# Patient Record
Sex: Female | Born: 1945 | Race: White | Hispanic: No | Smoking: Former smoker
Health system: Southern US, Community
[De-identification: ages and names within clinical notes are randomized; demographics above are authoritative.]

## PROBLEM LIST (undated history)

## (undated) DIAGNOSIS — I671 Cerebral aneurysm, nonruptured: Secondary | ICD-10-CM

## (undated) DIAGNOSIS — D649 Anemia, unspecified: Secondary | ICD-10-CM

## (undated) DIAGNOSIS — M069 Rheumatoid arthritis, unspecified: Secondary | ICD-10-CM

## (undated) DIAGNOSIS — J40 Bronchitis, not specified as acute or chronic: Secondary | ICD-10-CM

## (undated) DIAGNOSIS — T4145XA Adverse effect of unspecified anesthetic, initial encounter: Secondary | ICD-10-CM

## (undated) DIAGNOSIS — E278 Other specified disorders of adrenal gland: Secondary | ICD-10-CM

## (undated) DIAGNOSIS — T8859XA Other complications of anesthesia, initial encounter: Secondary | ICD-10-CM

## (undated) DIAGNOSIS — I4891 Unspecified atrial fibrillation: Secondary | ICD-10-CM

## (undated) DIAGNOSIS — J449 Chronic obstructive pulmonary disease, unspecified: Secondary | ICD-10-CM

## (undated) DIAGNOSIS — R0602 Shortness of breath: Secondary | ICD-10-CM

## (undated) DIAGNOSIS — J439 Emphysema, unspecified: Secondary | ICD-10-CM

## (undated) DIAGNOSIS — I729 Aneurysm of unspecified site: Secondary | ICD-10-CM

## (undated) DIAGNOSIS — M1990 Unspecified osteoarthritis, unspecified site: Secondary | ICD-10-CM

## (undated) DIAGNOSIS — G4730 Sleep apnea, unspecified: Secondary | ICD-10-CM

## (undated) DIAGNOSIS — E669 Obesity, unspecified: Secondary | ICD-10-CM

## (undated) DIAGNOSIS — F32A Depression, unspecified: Secondary | ICD-10-CM

## (undated) DIAGNOSIS — J9600 Acute respiratory failure, unspecified whether with hypoxia or hypercapnia: Secondary | ICD-10-CM

## (undated) DIAGNOSIS — F329 Major depressive disorder, single episode, unspecified: Secondary | ICD-10-CM

## (undated) DIAGNOSIS — N390 Urinary tract infection, site not specified: Secondary | ICD-10-CM

## (undated) DIAGNOSIS — K219 Gastro-esophageal reflux disease without esophagitis: Secondary | ICD-10-CM

## (undated) DIAGNOSIS — I10 Essential (primary) hypertension: Secondary | ICD-10-CM

## (undated) HISTORY — DX: Unspecified atrial fibrillation: I48.91

## (undated) HISTORY — PX: TUBAL LIGATION: SHX77

## (undated) HISTORY — DX: Sleep apnea, unspecified: G47.30

## (undated) HISTORY — DX: Anemia, unspecified: D64.9

## (undated) HISTORY — PX: BRAIN SURGERY: SHX531

## (undated) HISTORY — DX: Gastro-esophageal reflux disease without esophagitis: K21.9

## (undated) HISTORY — DX: Emphysema, unspecified: J43.9

## (undated) HISTORY — DX: Chronic obstructive pulmonary disease, unspecified: J44.9

## (undated) HISTORY — DX: Aneurysm of unspecified site: I72.9

## (undated) HISTORY — DX: Essential (primary) hypertension: I10

## (undated) HISTORY — PX: HAND SURGERY: SHX662

## (undated) HISTORY — DX: Cerebral aneurysm, nonruptured: I67.1

## (undated) HISTORY — DX: Unspecified osteoarthritis, unspecified site: M19.90

---

## 1998-07-10 ENCOUNTER — Ambulatory Visit (HOSPITAL_BASED_OUTPATIENT_CLINIC_OR_DEPARTMENT_OTHER): Admission: RE | Admit: 1998-07-10 | Discharge: 1998-07-10 | Payer: Self-pay | Admitting: Orthopedic Surgery

## 2000-06-01 ENCOUNTER — Encounter: Admission: RE | Admit: 2000-06-01 | Discharge: 2000-08-30 | Payer: Self-pay | Admitting: Internal Medicine

## 2001-07-08 ENCOUNTER — Encounter: Payer: Self-pay | Admitting: Internal Medicine

## 2001-07-08 ENCOUNTER — Encounter: Admission: RE | Admit: 2001-07-08 | Discharge: 2001-07-08 | Payer: Self-pay | Admitting: Internal Medicine

## 2003-05-31 ENCOUNTER — Encounter: Admission: RE | Admit: 2003-05-31 | Discharge: 2003-05-31 | Payer: Self-pay | Admitting: Internal Medicine

## 2004-08-21 ENCOUNTER — Ambulatory Visit (HOSPITAL_COMMUNITY): Admission: RE | Admit: 2004-08-21 | Discharge: 2004-08-21 | Payer: Self-pay | Admitting: Internal Medicine

## 2004-08-27 ENCOUNTER — Encounter: Admission: RE | Admit: 2004-08-27 | Discharge: 2004-08-27 | Payer: Self-pay | Admitting: Internal Medicine

## 2005-05-01 ENCOUNTER — Ambulatory Visit: Payer: Self-pay | Admitting: Internal Medicine

## 2005-05-06 ENCOUNTER — Ambulatory Visit: Payer: Self-pay | Admitting: *Deleted

## 2005-05-07 ENCOUNTER — Encounter: Admission: RE | Admit: 2005-05-07 | Discharge: 2005-05-07 | Payer: Self-pay | Admitting: Internal Medicine

## 2005-05-13 ENCOUNTER — Ambulatory Visit: Payer: Self-pay | Admitting: Pulmonary Disease

## 2005-06-19 ENCOUNTER — Ambulatory Visit: Payer: Self-pay | Admitting: Internal Medicine

## 2005-06-20 ENCOUNTER — Encounter: Admission: RE | Admit: 2005-06-20 | Discharge: 2005-06-20 | Payer: Self-pay | Admitting: Internal Medicine

## 2005-06-20 ENCOUNTER — Ambulatory Visit: Payer: Self-pay | Admitting: Internal Medicine

## 2005-07-17 ENCOUNTER — Ambulatory Visit: Payer: Self-pay | Admitting: Internal Medicine

## 2005-08-22 ENCOUNTER — Ambulatory Visit: Payer: Self-pay | Admitting: Internal Medicine

## 2005-11-24 ENCOUNTER — Ambulatory Visit: Payer: Self-pay | Admitting: Internal Medicine

## 2006-01-09 ENCOUNTER — Encounter: Admission: RE | Admit: 2006-01-09 | Discharge: 2006-01-09 | Payer: Self-pay | Admitting: Internal Medicine

## 2006-01-27 ENCOUNTER — Ambulatory Visit: Payer: Self-pay | Admitting: Critical Care Medicine

## 2006-03-24 ENCOUNTER — Ambulatory Visit: Payer: Self-pay | Admitting: Internal Medicine

## 2007-03-29 ENCOUNTER — Inpatient Hospital Stay (HOSPITAL_COMMUNITY): Admission: EM | Admit: 2007-03-29 | Discharge: 2007-04-01 | Payer: Self-pay | Admitting: Emergency Medicine

## 2007-04-01 ENCOUNTER — Encounter: Payer: Self-pay | Admitting: Internal Medicine

## 2007-04-01 DIAGNOSIS — J438 Other emphysema: Secondary | ICD-10-CM | POA: Insufficient documentation

## 2007-04-01 DIAGNOSIS — R42 Dizziness and giddiness: Secondary | ICD-10-CM | POA: Insufficient documentation

## 2007-04-01 DIAGNOSIS — M069 Rheumatoid arthritis, unspecified: Secondary | ICD-10-CM | POA: Insufficient documentation

## 2007-04-01 DIAGNOSIS — N959 Unspecified menopausal and perimenopausal disorder: Secondary | ICD-10-CM | POA: Insufficient documentation

## 2007-04-01 DIAGNOSIS — I059 Rheumatic mitral valve disease, unspecified: Secondary | ICD-10-CM | POA: Insufficient documentation

## 2007-04-01 DIAGNOSIS — J449 Chronic obstructive pulmonary disease, unspecified: Secondary | ICD-10-CM | POA: Insufficient documentation

## 2007-04-01 DIAGNOSIS — E1165 Type 2 diabetes mellitus with hyperglycemia: Secondary | ICD-10-CM

## 2007-04-01 DIAGNOSIS — Z8679 Personal history of other diseases of the circulatory system: Secondary | ICD-10-CM | POA: Insufficient documentation

## 2007-04-01 DIAGNOSIS — G4733 Obstructive sleep apnea (adult) (pediatric): Secondary | ICD-10-CM | POA: Insufficient documentation

## 2007-04-01 DIAGNOSIS — J45909 Unspecified asthma, uncomplicated: Secondary | ICD-10-CM | POA: Insufficient documentation

## 2007-05-21 ENCOUNTER — Ambulatory Visit: Payer: Self-pay | Admitting: Pulmonary Disease

## 2007-05-21 ENCOUNTER — Inpatient Hospital Stay (HOSPITAL_COMMUNITY): Admission: EM | Admit: 2007-05-21 | Discharge: 2007-05-28 | Payer: Self-pay | Admitting: Emergency Medicine

## 2007-06-07 ENCOUNTER — Encounter: Payer: Self-pay | Admitting: Interventional Radiology

## 2007-07-28 ENCOUNTER — Ambulatory Visit (HOSPITAL_COMMUNITY): Admission: RE | Admit: 2007-07-28 | Discharge: 2007-07-28 | Payer: Self-pay | Admitting: Internal Medicine

## 2007-08-03 ENCOUNTER — Other Ambulatory Visit: Admission: RE | Admit: 2007-08-03 | Discharge: 2007-08-03 | Payer: Self-pay | Admitting: Internal Medicine

## 2007-08-24 ENCOUNTER — Inpatient Hospital Stay (HOSPITAL_COMMUNITY): Admission: AD | Admit: 2007-08-24 | Discharge: 2007-08-25 | Payer: Self-pay | Admitting: Interventional Radiology

## 2007-09-07 ENCOUNTER — Encounter: Payer: Self-pay | Admitting: Interventional Radiology

## 2008-01-11 ENCOUNTER — Inpatient Hospital Stay (HOSPITAL_COMMUNITY): Admission: RE | Admit: 2008-01-11 | Discharge: 2008-01-12 | Payer: Self-pay | Admitting: Interventional Radiology

## 2008-01-16 ENCOUNTER — Observation Stay (HOSPITAL_COMMUNITY): Admission: EM | Admit: 2008-01-16 | Discharge: 2008-01-17 | Payer: Self-pay | Admitting: Emergency Medicine

## 2008-01-16 ENCOUNTER — Ambulatory Visit: Payer: Self-pay | Admitting: Internal Medicine

## 2008-01-25 ENCOUNTER — Encounter: Payer: Self-pay | Admitting: Interventional Radiology

## 2008-07-14 ENCOUNTER — Ambulatory Visit (HOSPITAL_COMMUNITY): Admission: RE | Admit: 2008-07-14 | Discharge: 2008-07-14 | Payer: Self-pay | Admitting: Interventional Radiology

## 2008-12-07 ENCOUNTER — Ambulatory Visit (HOSPITAL_COMMUNITY): Admission: RE | Admit: 2008-12-07 | Discharge: 2008-12-07 | Payer: Self-pay | Admitting: Internal Medicine

## 2009-01-29 ENCOUNTER — Ambulatory Visit (HOSPITAL_COMMUNITY): Admission: RE | Admit: 2009-01-29 | Discharge: 2009-01-29 | Payer: Self-pay | Admitting: Interventional Radiology

## 2009-03-05 ENCOUNTER — Encounter: Admission: RE | Admit: 2009-03-05 | Discharge: 2009-03-05 | Payer: Self-pay | Admitting: Internal Medicine

## 2009-03-09 ENCOUNTER — Encounter: Payer: Self-pay | Admitting: Interventional Radiology

## 2009-05-25 ENCOUNTER — Ambulatory Visit (HOSPITAL_COMMUNITY): Admission: RE | Admit: 2009-05-25 | Discharge: 2009-05-25 | Payer: Self-pay | Admitting: Interventional Radiology

## 2009-07-17 ENCOUNTER — Inpatient Hospital Stay (HOSPITAL_COMMUNITY): Admission: RE | Admit: 2009-07-17 | Discharge: 2009-07-18 | Payer: Self-pay | Admitting: Interventional Radiology

## 2009-07-31 ENCOUNTER — Encounter: Payer: Self-pay | Admitting: Interventional Radiology

## 2010-05-12 ENCOUNTER — Encounter: Payer: Self-pay | Admitting: Internal Medicine

## 2010-05-12 ENCOUNTER — Encounter: Payer: Self-pay | Admitting: Interventional Radiology

## 2010-07-10 LAB — DIFFERENTIAL
Basophils Absolute: 0.1 10*3/uL (ref 0.0–0.1)
Basophils Relative: 1 % (ref 0–1)
Neutro Abs: 8.2 10*3/uL — ABNORMAL HIGH (ref 1.7–7.7)
Neutrophils Relative %: 69 % (ref 43–77)

## 2010-07-10 LAB — BASIC METABOLIC PANEL
BUN: 16 mg/dL (ref 6–23)
CO2: 31 mEq/L (ref 19–32)
Calcium: 8.4 mg/dL (ref 8.4–10.5)
Creatinine, Ser: 1.03 mg/dL (ref 0.4–1.2)
Glucose, Bld: 93 mg/dL (ref 70–99)

## 2010-07-10 LAB — CBC
MCHC: 32.5 g/dL (ref 30.0–36.0)
RDW: 16 % — ABNORMAL HIGH (ref 11.5–15.5)

## 2010-07-10 LAB — APTT: aPTT: 27 seconds (ref 24–37)

## 2010-07-10 LAB — PROTIME-INR: INR: 1.05 (ref 0.00–1.49)

## 2010-07-12 ENCOUNTER — Other Ambulatory Visit: Payer: Self-pay | Admitting: Internal Medicine

## 2010-07-12 DIAGNOSIS — Z1231 Encounter for screening mammogram for malignant neoplasm of breast: Secondary | ICD-10-CM

## 2010-07-14 LAB — BASIC METABOLIC PANEL
BUN: 19 mg/dL (ref 6–23)
CO2: 31 mEq/L (ref 19–32)
CO2: 33 mEq/L — ABNORMAL HIGH (ref 19–32)
Calcium: 7.7 mg/dL — ABNORMAL LOW (ref 8.4–10.5)
Chloride: 103 mEq/L (ref 96–112)
Chloride: 96 mEq/L (ref 96–112)
Creatinine, Ser: 1.04 mg/dL (ref 0.4–1.2)
GFR calc Af Amer: >60 mL/min (ref >60)
Glucose, Bld: 100 mg/dL — ABNORMAL HIGH (ref 70–99)
Potassium: 3.8 mEq/L (ref 3.5–5.1)
Sodium: 137 mEq/L (ref 135–145)

## 2010-07-14 LAB — DIFFERENTIAL
Basophils Relative: 0 % (ref 0–1)
Eosinophils Absolute: 0.3 10*3/uL (ref 0.0–0.7)
Eosinophils Relative: 2 % (ref 0–5)
Monocytes Relative: 6 % (ref 3–12)
Neutrophils Relative %: 79 % — ABNORMAL HIGH (ref 43–77)

## 2010-07-14 LAB — CBC
HCT: 34.2 % — ABNORMAL LOW (ref 36.0–46.0)
HCT: 39.6 % (ref 36.0–46.0)
Hemoglobin: 11.2 g/dL — ABNORMAL LOW (ref 12.0–15.0)
MCHC: 32.8 g/dL (ref 30.0–36.0)
MCHC: 32.8 g/dL (ref 30.0–36.0)
MCV: 85.5 fL (ref 78.0–100.0)
MCV: 85.8 fL (ref 78.0–100.0)
RBC: 3.99 MIL/uL (ref 3.87–5.11)
RBC: 4.61 MIL/uL (ref 3.87–5.11)
RDW: 17 % — ABNORMAL HIGH (ref 11.5–15.5)

## 2010-07-14 LAB — PROTIME-INR: INR: 0.97 (ref 0.00–1.49)

## 2010-07-17 ENCOUNTER — Ambulatory Visit
Admission: RE | Admit: 2010-07-17 | Discharge: 2010-07-17 | Disposition: A | Discharge status: Home / Self Care | Payer: PRIVATE HEALTH INSURANCE | Source: Ambulatory Visit | Attending: Internal Medicine | Admitting: Internal Medicine

## 2010-07-17 DIAGNOSIS — Z1231 Encounter for screening mammogram for malignant neoplasm of breast: Secondary | ICD-10-CM

## 2010-07-19 ENCOUNTER — Other Ambulatory Visit: Payer: Self-pay | Admitting: Internal Medicine

## 2010-07-19 DIAGNOSIS — R928 Other abnormal and inconclusive findings on diagnostic imaging of breast: Secondary | ICD-10-CM

## 2010-07-24 ENCOUNTER — Ambulatory Visit
Admission: RE | Admit: 2010-07-24 | Discharge: 2010-07-24 | Disposition: A | Discharge status: Home / Self Care | Payer: Medicare Other | Source: Ambulatory Visit | Attending: Internal Medicine | Admitting: Internal Medicine

## 2010-07-24 DIAGNOSIS — R928 Other abnormal and inconclusive findings on diagnostic imaging of breast: Secondary | ICD-10-CM

## 2010-07-29 ENCOUNTER — Other Ambulatory Visit: Payer: Self-pay | Admitting: Internal Medicine

## 2010-07-29 DIAGNOSIS — Z09 Encounter for follow-up examination after completed treatment for conditions other than malignant neoplasm: Secondary | ICD-10-CM

## 2010-08-01 LAB — BASIC METABOLIC PANEL
CO2: 31 mEq/L (ref 19–32)
Chloride: 101 mEq/L (ref 96–112)
GFR calc Af Amer: >60 mL/min (ref >60)
Glucose, Bld: 113 mg/dL — ABNORMAL HIGH (ref 70–99)
Potassium: 4.1 mEq/L (ref 3.5–5.1)
Sodium: 138 mEq/L (ref 135–145)

## 2010-08-01 LAB — CBC
HCT: 36.1 % (ref 36.0–46.0)
Hemoglobin: 11.7 g/dL — ABNORMAL LOW (ref 12.0–15.0)
MCHC: 32.5 g/dL (ref 30.0–36.0)
RBC: 4.33 MIL/uL (ref 3.87–5.11)
RDW: 16.2 % — ABNORMAL HIGH (ref 11.5–15.5)

## 2010-08-01 LAB — PROTIME-INR: INR: 1 (ref 0.00–1.49)

## 2010-08-13 ENCOUNTER — Other Ambulatory Visit (HOSPITAL_COMMUNITY): Payer: Self-pay | Admitting: Interventional Radiology

## 2010-08-13 DIAGNOSIS — I729 Aneurysm of unspecified site: Secondary | ICD-10-CM

## 2010-08-15 ENCOUNTER — Ambulatory Visit (HOSPITAL_COMMUNITY)
Admission: RE | Admit: 2010-08-15 | Discharge: 2010-08-15 | Disposition: A | Discharge status: Home / Self Care | Payer: Medicare Other | Source: Ambulatory Visit | Attending: Interventional Radiology | Admitting: Interventional Radiology

## 2010-08-15 ENCOUNTER — Other Ambulatory Visit (HOSPITAL_COMMUNITY): Payer: Self-pay | Admitting: Interventional Radiology

## 2010-08-15 DIAGNOSIS — I729 Aneurysm of unspecified site: Secondary | ICD-10-CM

## 2010-08-15 DIAGNOSIS — I671 Cerebral aneurysm, nonruptured: Secondary | ICD-10-CM | POA: Insufficient documentation

## 2010-08-15 LAB — BUN: BUN: 32 mg/dL — ABNORMAL HIGH (ref 6–23)

## 2010-08-15 LAB — CREATININE, SERUM
Creatinine, Ser: 1.81 mg/dL — ABNORMAL HIGH (ref 0.4–1.2)
GFR calc non Af Amer: 28 mL/min — ABNORMAL LOW (ref >60)

## 2010-09-03 NOTE — Consult Note (Signed)
NAMEAMBRY, DIX NO.:  [id number]   MEDICAL RECORD NO.:  [id number]          PATIENT TYPE:  INP   LOCATION:  3108                         FACILITY:  [facility]   PHYSICIAN:  Michiel Cowboy, MDDATE OF BIRTH:  06-27-1945   DATE OF CONSULTATION:  05/25/2007  DATE OF DISCHARGE:                                 CONSULTATION   PRIMARY CARE PHYSICIAN:  Deirdre Peer. Polite, M.D.   PULMONOLOGIST:  Charlaine Dalton. Wert, MD, FCCP   This is a 65 year old female with past medical history significant for  chronic obstructive pulmonary disease and paroxysmal atrial  fibrillation. Patient presented to emergency department on May 21, 2007 with headache and turned out to have an  aortic aneurysm and  was  consulted and patient had endovascular repair on May 21, 2007  patient was intubated and in neuro intensive care unit but now has been  doing much better.  Extubated on May 23, 2007 and doing very good  currently on 2 L of FIO2 with still some slight wheezing.  Examined at  bedside.  Patient still reports some headache.  As per neurosurgery this  is to be expected.  Otherwise doing very well tolerating p.o.'s.  Medicine consult was called for management of her medical issues.   PAST MEDICAL HISTORY:  Questionable history of paroxysmal atrial  fibrillation, rheumatoid arthritis, chronic obstructive pulmonary  disease and gastroesophageal reflux disease.   SOCIAL HISTORY:  The patient is 45 pack year smoker.  Denies alcohol.  Lives with her friend at home.   FAMILY HISTORY:  Noncontributory.   MEDICATIONS:  Advair 250/50, Spiriva once a  day, Protonix 40 mg once a  day, sliding scale insulin  Those are current medications.   HOME MEDICATIONS:  Prilosec 20 mg daily, prednisone 10 mg by mouth  daily, Lexapro 10 mg by mouth daily, bisoprolol 5 mg by mouth daily,  Aldactone/hydrochlorothiazide 25/25 one tablet each daily, Spiriva one  puff each daily, Advair one puff  twice daily and albuterol two puffs  every four to six hours as needed.   VITAL SIGNS:  Blood pressure 139/49, respirations 18, satting 95% on  room air, heart rate 76 to 103.  Blood glucose 209 which is down to 88.   PHYSICAL EXAMINATION:  Obese female in no acute distress.  HEENT:  Head nontraumatic.  PERRL.  Moist mucous membranes.  LUNGS:  Wheezes bilaterally distant breath sounds.  HEART:  Regular rate and rhythm, no murmurs, rubs or gallops.  EXTREMITIES:  With 1+ edema.   LABORATORY DATA:  White blood cell count done 13.6, hemoglobin 9.2,  platelet count 351,000.  Sodium 139, potassium 3.5, creatinine 0.8 down  from 1.4.  Calcium 8.5.  Chest x-ray show no acute disease, mild  bilateral atelectasis.  Patient had MRI and MRA today,  results of which  are pending.   ASSESSMENT AND PLAN:  This is a 65 year old female with history of  severe COPD, questionable paroxysmal atrial fibrillation, hypertension,  rheumatoid arthritis, GERD, who is now status post a coil of basilar  aneurysm.  1. Basilar aneurysm as per neurosurgery.  2.  History of chronic obstructive pulmonary disease, will continue      Advair, Spiriva.  Will change Ventolin to Xopenex as needed.  The      patient has chronic shortness of breath.  She has been recently      extubated and had  been on 2 L of oxygen FiO2,  with fairly good      sats to stay above 91% for patient with COPD oxygen saturations      within 88 to 93% are tolerable.  If wheezing persists or worsens      would increase steroid dose but for right now given elevated      glucose levels.  3. Atelectasis.  Would continue incentive spirometer.  4. History of sleep apnea.  Patient refuses CPAP.  Will continue to      use oxygen at night.  5. Elevated creatinine on admission, now currently  improved after      fluids.  Patient now off IV hydration secondary to  edema.  6. History of atrial fibrillation  ? Will discuss with Dr. [name].      For now  would restart bisoprolol 5 mg by mouth daily 40 parameters.  7. History of hypertension.  Patient was off hydrochlorothiazide      secondary to hyponatremia.  Would be able to restart      hydrochlorothiazide once blood pressure allows.  8. Mild peripheral edema, likely status post good rehydration.  Will      monitor, check blood pressure  in the morning.  Currently patient      in no acute distress.  Chest x-ray does not show any  pulmonary      edema.  She may benefit in the future from restarting her      hydrochlorothiazide.  9. History of RA-  will continue steroids at current doses.  10.Elevated blood glucose likely secondary to steroids.  Continue      sliding scale for now.  Hopefully this will improve as steroids are      weaned down.  If have to increase steroid dose secondary to COPD,      may need initiation of standing insulin.  11.Gastroesophageal reflux disease.  Will continue Protonix.  Will      check morning labs.  Dr. [name] to follow up in the morning.      Michiel Cowboy, MD  Electronically Signed     AVD/MEDQ  D:  05/25/2007  T:  05/26/2007  Job:  [id number]   cc:   Hilda Lias, M.D.  Deirdre Peer. Polite, M.D.

## 2010-09-03 NOTE — H&P (Signed)
NAME:  Jaime, Mccormick NO.:  [id number]   MEDICAL RECORD NO.:  [id number]          PATIENT TYPE:  INP   LOCATION:  1824                         FACILITY:  [facility]   PHYSICIAN:  Jaime Furlong, MD      DATE OF BIRTH:  01-14-1946   DATE OF ADMISSION:  01/16/2008  DATE OF DISCHARGE:                              HISTORY & PHYSICAL   PRIMARY CARE Mikhi Mccormick:  Jaime Dills, MD.   CHIEF COMPLAINT:  Severe intractable headache since intracerebral  arterial coiling.   HISTORY OF PRESENTING ILLNESS:  [name] is a 65 year old Caucasian  female who lives in [place] with her friend.  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  She had a history of coiling placed in the past in  January and May 2009.  The patient lists he had a worsening headache,  especially he had undergone procedure by Dr. Julieanne Mccormick, with  interventional neuroradiology, on September 22nd for the recoiling of  basilar artery aneurysm.  After the surgery she went home and she  continued to have headache which was mild to moderate in nature, located  on the left side of the head, mostly on the occipital and parietal area.  Last night she had a very severe headache which also continued this  morning and today.  She and her friend noticed that her face was  drooping on the right side also, but it did not last very long and she  did not have any trouble speaking at the time of facial droop.  She  denied any other neurological symptoms including any focal neurological  weakness and paresthesia since the surgery.  She denied any other  systemic symptoms including fever, chills, nausea, vomiting, diarrhea,  abdominal pain, lower extremity swelling, visual changes, hearing  changes.  She does have a chronic history of nausea with pain  medication, ibuprofen use occasionally.  Past medical history is also  significant for intracranial  aneurysms.  Patient denied any severe  headache at the time of encounter.  She mentioned that her headache is  slowly getting better since she has been in the ER without any active  intervention.  She had an MRI done without contrast in the ER which did  not show any acute worsening of her coiling or any intracranial  pathology.  ER physician has spoke to [name] on the phone and  they have decided to observe patient for 23 hours in the hospital.  That  is why patient is getting admitted to [name] service tonight for  observation.  The patient is clinically stable at this time.   PAST MEDICAL HISTORY:  1. Left internal carotid artery, basilar artery aneurysm.  2. Status intravascular coiling.  3. COPD.  4. Hypertension.  5. Gastroesophageal reflux disease.  6. Rheumatoid arthritis.  7. History of tobacco use.  8. Obstructive sleep apnea.  9. Obesity.  10.Mild renal insufficiency.  11.Questionable history of paroxysmal atrial fibrillation.  12.Anxiety.  13.Depression.   THE FAMILY HISTORY:  Not significant for any intracranial aneurysms.  SOCIAL HISTORY:  Patient lives with her friend in [place], no recent  alcohol, drug, tobacco use.   ALLERGY:  PATIENT IS ALLERGIC TO following medicine. Please review the  chart   HOME MEDICATIONS:  Prilosec, Spiriva with HandiHaler,  Aldactazide  including Aldactone and hydrochlorothiazide, Ambien,  Advair,  bisoprolol, Boniva, Lasix, prednisone, Plavix and aspirin.   REVIEW OF SYSTEMS:  Positive as per HPI, otherwise negative review of  systems done for 14 systems.   PHYSICAL EXAM:  VITAL SIGNS:  Blood pressure 134/77, pulse 66,  respirations 28, temperature 97.3, oxygen saturation 95% on room air.  GENERAL EXAM:  Alert, oriented x3, not in acute distress lying in bed.  CARDIOVASCULAR:  S1, S2 regular.  No murmur, rub, gallop.  LUNGS:  Clear to auscultation bilaterally.  No wheezing, rhonchi,  crackles.  ABDOMEN:   Nontender, nondistended.  Bowel sounds present.  No  organomegaly appreciated, the patient is obese.  EXTREMITIES:  No clubbing, cyanosis, edema.  All pulses palpable in all  4 extremities.  NEUROLOGICAL EXAM:  Shows intact cranial nerves, muscular strength,  sensation, reflexes are within normal limits at this time.  HEAD:  Normocephalic nontraumatic.  EYES:  Pupils equally reactive to light and accommodation.  Extraocular  muscles intact.  ORAL CAVITY:  Oral mucosa moist.  No thrush noted.  NECK:  No thyromegaly or JVD.  Patient is obese in the neck also.  SKIN:  No rash or bruits.   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   ASSESSMENT AND PLAN:  1. Severe intractable headache status post procedure for an      intracranial aneurysm, history of bibasilar artery aneurysm status      post arterial coiling.  2. History of chronic obstructive pulmonary disease.  3. History of hypertension.  4. History of gastroesophageal reflux disease.  5. History of rheumatoid arthritis.  6. History of tobacco use.  7. History of obstructive sleep apnea.  8. History of mild renal insufficiency.  9. History of questionable paroxysmal atrial fibrillation.  10.History of depression.  11.History of anxiety.   PLAN:  Will admit patient to Dr. Renford Mccormick on telemetry bed for  overnight observation with a diagnosis of intractable headache status  post procedure.  We will check vitals and neuro check every 4 hours.  We  will check input/output every 8 hours.  We will get CBC with  differential  and CMP now and ESR also.  We will give breathing treatment  with albuterol ipratropium q.6 h and p.r.n.  Will give oxygen by nasal  cannula to keep oxygen saturation more than 92.  We will give her  omeprazole for GI prophylaxis.  We will give her albuterol inhaler as  needed for her acute episode of shortness of breath.  We will give her  Ambien for her sleep tonight.  Will continue her hydrochlorothiazide,  Plavix and aspirin at home dose.  We will make sure interventional  neuroradiologist, Dr.  Corliss Mccormick, sees her in the morning before the discharge planning.  Will  give her ibuprofen 800 mg p.o. q.8 h p.r.n. for headache and for  severe  headache I will add morphine p.r.n. also IV.  Further plan according to  the consult pending.      Jaime Furlong, MD  Electronically Signed     TVP/MEDQ  D:  01/16/2008  T:  01/16/2008  Job:  [id number]   cc:   Deirdre Peer. Polite, M.D.

## 2010-09-03 NOTE — H&P (Signed)
NAME:  [name], NGHIEM NO.:  [id number]   MEDICAL RECORD NO.:  [id number]          PATIENT TYPE:  EMS   LOCATION:  MAJO                         FACILITY:  [facility]   PHYSICIAN:  Michelene Gardener, MD    DATE OF BIRTH:  [birth year]   DATE OF ADMISSION:  03/29/2007  DATE OF DISCHARGE:                              HISTORY & PHYSICAL   PRIMARY CARE PHYSICIAN:  Deirdre Peer. Polite, M.D.   CHIEF COMPLAINT:  Increasing shortness of breath.   HISTORY OF PRESENT ILLNESS:  This is a 65 year old Caucasian female with  past medical history if COPD, who presented with the above mentioned  complaint.  Patient currently in acute shortness of breath and she is  not able to give good history.  Stated that for the last 10 days she has  been having increasing shortness of breath that has been increasing  gradually.  Sought her doctor and was prescribed some medications but  did not help very much, so she came in today for further evaluation.  Also been having some cough.  Denied chest pain.  In the ER, her initial  vitals showed temperature 97.2, blood pressure 176/71, pulse 86,  respiratory rate 26.  Patient was given Solu-Medrol, oxygen and one dose  of IV antibiotics and the Hospitalist Service was called for further  evaluation.   ALLERGIES:  No known drug allergies.   CURRENT MEDICATIONS:  Patient is not able to give much details about  medications for now but states that she is taking Prilosec, Spiriva,  Aldactazide, Ambien, Advair, bisoprolol, Boniva.   PAST MEDICAL HISTORY:  1. COPD.  2. GERD.   PAST SURGICAL HISTORY:  Denied.   SOCIAL HISTORY:  No smoking, no alcohol.   FAMILY HISTORY:  Noncontributory.   REVIEW OF SYSTEMS:  As per HPI.   PHYSICAL EXAMINATION:  VITAL SIGNS:  Temperature 97.2, blood pressure  176/71, pulse 86, respiratory rate 26.  GENERAL APPEARANCE:  This is a middle-age, obese Caucasian female not in  acute distress.  HEENT:  Conjunctivae pink.   Pupils are equal, round, reactive to light  and accommodation.  There is no ptosis.  Extraocular movements intact.  There is no ear discharge or infection.  There is no nose infection or  bleeding.  Oral mucosa is dry.  No pharyngeal erythema.  NECK:  Supple.  No JVD, no carotid bruit, no lymphadenopathy.  No  thyroid enlargement.  LUNGS:  Patient is breathing around 20-22.  There is no use of accessory  muscles.  No rales, no rhonchi.  There is positive bilateral expiratory  wheezes.  CARDIOVASCULAR:  S1 and S2 are regular.  No murmurs, gallops or thrills.  ABDOMEN:  Soft, obese, not distended.  No tenderness and no  hepatosplenomegaly.  EXTREMITIES:  Lower extremities with no edema, no rash, no varicose  veins.  SKIN:  No rash and no erythema.  NEUROLOGIC:  Cranial nerves are intact.  Unable to do detailed exam  because of her current shortness of breath.   LABORATORY DATA:  Sodium 122, potassium 3.8, chloride 91, bicarb 27.4,  BUN 26, creatinine 1.8.  WBC 22.5, hemoglobin 11.6, hematocrit 34.7,  platelet count 420.   Chest x-ray showed hyperinflation consistent with COPD and there is no  evidence of pneumonia.   IMPRESSION:  1. Chronic obstructive pulmonary disease exacerbation.  I will admit      this patient to the step-down unit.  I will get stat ABG on her to      see if she is retaining carbon dioxide.  I advised her about      possible BiPAP but she stated that she cannot tolerate BiPAP from      previous admissions.  Will start on Solu-Medrol 80 mg IV q.6h.  Put      her on Atrovent Proventil nebulizer treatment q.4h. scheduled and      q.2h. p.r.n.  I will start her on Avelox 400 mg once daily.  Will      apply oxygen to keep saturation between 88-92.  I will continue her      Advair and Spiriva.  This patient has been followed by Dr. Sandrea Hughs for pulmonary and if her condition worsens, then will consider      consulting him.  2. Systemic inflammatory response  syndrome.  This patient has elevated      white count, with associated tachycardia.  There is no evidence of      infection.  Might be bronchitis but her white count is elevated      more than simple bronchitis.  Patient already started on Avelox IV.      Was sent for urinalysis, urine culture, blood culture x2 and sputum      culture.  3. Hyponatremia.  Etiology is unclear at this point. Her sodium is      122.  There is no previous labs for comparison.  Will send      hyponatremia testing that includes serum osmolality, urine      osmolality, TSH and cortisol level. This patient is on diuretics      and this might contribute to her hyponatremia.  I will hold her      diuretics for now and will repeat her sodium.  Will also start her      on normal saline at 70 mL per hour.  4. Renal insufficiency.  Her creatinine is 1.8.  Her BUN is 26.  This      might be consistent with dehydration.  Again, this might be      secondary to her diuretics, her hydrochlorothiazide.  I will hold      her hydrochlorothiazide, apply some fluids and will follow her      sodium level.  5. Gastroesophageal reflux disease.  Continue current medications.  6. Hypertension.  Continue her current medications and hold      hydrochlorothiazide.   TOTAL ASSESSMENT TIME:  One hour of critical care time.      Michelene Gardener, MD  Electronically Signed     NAE/MEDQ  D:  03/29/2007  T:  03/29/2007  Job:  [id number]   cc:   Deirdre Peer. Polite, M.D.

## 2010-09-03 NOTE — H&P (Signed)
[name], HASAN NO.:  [id number]   MEDICAL RECORD NO.:  [id number]          PATIENT TYPE:  INP   LOCATION:  3108                         FACILITY:  [facility]   PHYSICIAN:  Hilda Lias, M.D.   DATE OF BIRTH:  [birth year]   DATE OF ADMISSION:  05/21/2007  DATE OF DISCHARGE:                              HISTORY & PHYSICAL   Ms. Shehata is a lady who came to the emergency room complaining of  headache, difficulty walking and difficulty with her right eye to the  point that she has been unable to focus.  This problem has been going on  for 10 days.  She has been treated by Dr. [name] for COPD as well as  hypertension.  Because of the findings at the emergency room,  proceeded  with a CT scan, and we were called for evaluation.   PAST MEDICAL HISTORY:  She has had surgery on both hands.   She is allergic to __________ injection.   At the present time she is taking, ibuprofen, hydrochlorothiazide, and  prednisone.   FAMILY HISTORY:  Unremarkable.   SOCIAL HISTORY:  The patient does not smoke.   REVIEW OF SYSTEMS:  Positive for a history of COPD, being taken care of  by Dr. Sherene Sires.  She also has a history of rheumatoid arthritis, being  taken care of by Dr. [name].   PHYSICAL EXAMINATION:  GENERAL:  The patient lying bed.  She is not in  any acute distress.  HEENT:  Normal.  NECK:  There is no evidence of any stiffness.  LUNGS:  There is some bilateral wheezing.  They have distant sounds.  CARDIOVASCULAR:  Normal.  ABDOMEN:  Unable to feel any mass.  EXTREMITIES:  Lower extremity edema in leg __________.  NEUROLOGIC:  Mental status normal.  __________, the right pupil is about  4 mm, the left one is 3 mm.  The right one is sluggish to reaction.  The  face:  She has a mild weakness on the right side.  There is no weakness.  The reflexes are symmetrical.  Sensation normal.  Gait was not tested.   The CT scan showed a large mass in the midline most likely a  basilar tip  aneurysm.   CLINICAL IMPRESSION:  Basilar tip aneurysm.   RECOMMENDATIONS:  I talked to the patient, I talked to Dr. [name], and  also I talked to Dr. [name] from the x-ray department.  The lady is  going to be admitted.  We are going to proceed with a cerebral  angiogram, and from there a decision will be made and see if an  endovascular procedure can be done.  Dr. [name] fully explained the  procedure in front of me.  Will be standby in case of any complication.           ______________________________  Hilda Lias, M.D.     EB/MEDQ  D:  05/21/2007  T:  05/22/2007  Job:  [id number]

## 2010-09-03 NOTE — Consult Note (Signed)
NAME:  [name], [name]            ACCOUNT NO.:  [id number]   MEDICAL RECORD NO.:  [id number]          PATIENT TYPE:  OUT   LOCATION:  XRAY                         FACILITY:  [facility]   PHYSICIAN:  Sanjeev K. Deveshwar, M.D.DATE OF BIRTH:  Nov 27, 1945   DATE OF CONSULTATION:  09/07/2007  DATE OF DISCHARGE:                                 CONSULTATION   DATE OF CONSULT:  Sep 07, 2007.   CHIEF COMPLAINT:  Status post cerebral aneurysm coiling.   HISTORY OF PRESENT ILLNESS:  This is a very pleasant 65 year old female  who was initially referred to Dr. [name] through the courtesy of Dr.  [name] on May 21, 2007, after the patient presented to [facility] emergency department with a severe headache and was found to  have a giant basilar apex aneurysm.  The aneurysm measured approximately  17 x 16 mm.  She underwent coiling of the aneurysm on the day of  admission.   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  The patient underwent  stent-assisted coiling of this aneurysm on Aug 24, 2007.  She returns  today, to be seen in followup by Dr. [name].   PAST MEDICAL HISTORY:  Significant for the above-noted aneurysms.  She  has a history of hypertension, COPD previously seen by Dr. Elesa Massed,  paroxysmal atrial fibrillation, rheumatoid arthritis, gastroesophageal  reflux disease, glucose intolerance, obstructive sleep apnea, although,  she does not tolerate CPAP.  She has had elevated glucose levels at  times.  She has some mild renal insufficiency.   ALLERGIES:  SHE IS ALLERGIC TO INJECTABLE GOLD.   CURRENT MEDICATIONS:  Include Spiriva, Advair, ProAir, omeprazole,  prednisone, bisoprolol, Lexapro, spironolactone/hydrochlorothiazide,  Plavix, enteric-coated aspirin 325 mg daily, Centrum Silver, calcium  with vitamin D, Ambien at bedtime, and she uses oxygen 2.5 L at night.   SOCIAL HISTORY:  The patient  lives in [place] with a friend.  She  lost her husband approximately 2 years ago.  She is still having some  problems with the emotional aspect of that situation.  She has remote  history of tobacco use.  She smoked for 45 years.  She did quit smoking.  She does not use alcohol.   FAMILY HISTORY:  There is no family history of cerebral aneurysms.   SURGICAL HISTORY:  The patient has had surgery on both hands.   IMPRESSION AND PLAN:  As noted, the patient presents today to be seen in  followup by Dr. [name] following endovascular treatment of a second  cerebral aneurysm performed on Aug 24, 2007.  The patient reports she has  been doing well overall, although, she does have occasional headaches  and she still has some visual problems associated with her initial  aneurysm.  She has diplopia, especially when looking down towards the  ground.   The patient remains on aspirin and Plavix.  Dr. [name] recommended  that the patient continue Plavix daily for 2 more weeks and switch to  every other day and then stop the Plavix.  She  should continue her  aspirin indefinitely.   Her previous hospital course was complicated by some anemia.  She  recently had some blood work performed at Dr. Idelle Crouch office, however,  we do not have the results of that CBC.  The patient reports that she is  still anemic.   Dr. [name] did review the results of the angiogram with the patient.  He explained to her again exactly what he did.  He showed her the  position of the stent as well as the coils in the most recent aneurysm.  The patient also had some questions regarding the previously coiled  giant basilar aneurysm.  The most recent angiogram did show compaction  of some of the coils in this giant basilar apex aneurysm.  Dr. [name]  has recommended a further procedure with a repeat angiogram in September  to further evaluate the situation.  If the coils are still compacted, he  felt that the  addition of further coils would be warranted.  The patient  is in agreement with this plan.  She did have multiple questions all of  which were answered.  Dr. [name] spent greater than 40 minutes with  the patient on this followup visit.      Delton See, P.A.    ______________________________  Grandville Silos. [name], M.D.    DR/MEDQ  D:  09/07/2007  T:  09/08/2007  Job:  [id number]   cc:   Hilda Lias, M.D.  Deirdre Peer. Polite, M.D.

## 2010-09-03 NOTE — H&P (Signed)
NAME:  [name], [name]            ACCOUNT NO.:  [id number]   MEDICAL RECORD NO.:  [id number]          PATIENT TYPE:  OIB   LOCATION:  3172                         FACILITY:  [facility]   PHYSICIAN:  Sanjeev K. Deveshwar, M.D.DATE OF BIRTH:  03-11-1946   DATE OF ADMISSION:  01/11/2008  DATE OF DISCHARGE:                              HISTORY & PHYSICAL   CHIEF COMPLAINTS:  Cerebral aneurysms.   HISTORY OF PRESENT ILLNESS:  This is a pleasant 65 year old female who  was initially referred to Dr. [name] through the courtesy of Dr.  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  The aneurysm measured approximately  17 x 16 mm.  She underwent coiling of the aneurysm on the day of the  admission.   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  The patient underwent  stent assisted coiling of that aneurysm on Aug 24, 2007.   The patient has been followed closely since that time.  She was found to  have some recannulization of the neck portion of the giant basilar apex  aneurysm and a decision was made to have the patient return to [facility] today to have this area further addressed.  The patient  was started on Plavix 3 days prior to admission in anticipation of  possible stent placement.   PAST MEDICAL HISTORY:  Significant for the above-noted aneurysms.  She  has a history of hypertension, COPD, previously followed by Dr. Sherene Sires.  She has a history of paroxysmal atrial fibrillation, rheumatoid  arthritis, gastroesophageal reflux disease, glucose intolerance,  obstructive sleep apnea although she does not tolerate CPAP, she has a  history of elevated glucose levels at times but has not been diagnosed  with diabetes according to the patient.  She has also had some mild  renal  insufficiency.   ALLERGIES:  The patient is allergic to INJECTABLE GOLD.  She also does  not tolerate some ADHESIVES from tape.   MEDICATIONS ON ADMISSION:  Included Spiriva, Advair, ProAir, omeprazole,  prednisone, bisoprolol, spironolactone, hydrochlorothiazide, enteric-  coated aspirin, Centrum Silver, Plavix, and zolpidem.   SOCIAL HISTORY:  The patient lives in [place] with a friend.  Her  husband passed away approximately 3 years ago.  She still has difficulty  dealing with the emotional aspect of the situation.  She has a remote  history of tobacco use.  She has since quit smoking.  She did smoke for  about 45 years.  She has COPD.  She does not use alcohol.   FAMILY HISTORY:  There is no family history of cerebral aneurysms.   SURGICAL HISTORY:  The patient has had surgery on both hands.  She  reports that she has had problems waking up after general anesthesia.   LABORATORY DATA:  INR is 1, PTT is 26, BUN is 18, creatinine 1.02,  hemoglobin 11.2, hematocrit 34.1, WBCs 13,200, although she is on  prednisone, platelet count 398,000, potassium was 4.1, GFR was 55,  glucose was 112.   REVIEW OF SYSTEMS:  Completely negative except for chronic dyspnea on  exertion with occasional cough productive of sputum.  She has had some  recent headaches.  She reports that she bruises easily.  She has joint  pain secondary to her rheumatoid arthritis.  She has had some high  glucose levels but has not been diagnosed with diabetes.  She has had  some problems with seasonal allergies.   PHYSICAL EXAM:  CONSTITUTIONAL:  Reveals a pleasant 65 year old white  female in no acute distress.  VITAL SIGNS:  Pending at time of this dictation.  HEENT:  Unremarkable.  NECK:  Revealed no bruits.  HEART:  Revealed regular rate and rhythm with distant heart sounds.  LUNGS:  Revealed decreased breath sounds without wheezing.  ABDOMEN:  Obese, soft, nontender.  EXTREMITIES:  Reveal pulses to be  intact with 1 to 2+ pitting edema.  Her airway was rated at a III, her ASA scale was a IV.  NEUROLOGIC:  The patient was alert and oriented and followed all  commands.  Cranial nerves II-XII are grossly intact.  Sensation was  intact to light touch.  Motor strength was 4/5 throughout.  Cerebellar  testing was intact.   IMPRESSION:  1. Giant basilar apex aneurysm coiled by Dr. [name] on May 21, 2007.  2. 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 [name] Aug 24, 2007.  3. Recannulization of the neck of the basilar aneurysm with plans for      further intervention today.  4. History of hypertension.  5. Severe chronic obstructive pulmonary disease.  6. Paroxysmal atrial fibrillation.  7. Rheumatoid arthritis.  8. Gastroesophageal reflux disease.  9. Glucose intolerance.  10.Obstructive sleep apnea.  11.History of elevated glucose levels.  12.Renal insufficiency.  13.Anxiety and depression.  14.Mild anemia.   PLAN:  As noted, the patient presents today to undergo further  intervention on her basilar aneurysm which initially measured 17 x 16  mm.  The patient does have a history of some renal insufficiency.  She  was told to stop her  diuretics several days prior to admission and a increase her fluid  intake.  She was also given a sodium bicarb protocol at time of  admission.  This did improve her creatinine.  She was started on Plavix  3 days prior to admission in anticipation of possible stent placement.  A cerebral angiogram will be repeated prior to intervening.      Delton See, P.A.    ______________________________  Grandville Silos. [name], M.D.    DR/MEDQ  D:  01/11/2008  T:  01/11/2008  Job:  [id number]   cc:   Deirdre Peer. Polite, M.D.  Charlaine Dalton. Sherene Sires, MD, [facility]  Hilda Lias, M.D.

## 2010-09-03 NOTE — Discharge Summary (Signed)
[name], [name]            ACCOUNT NO.:  [id number]   MEDICAL RECORD NO.:  [id number]          PATIENT TYPE:  INP   LOCATION:  3108                         FACILITY:  [facility]   PHYSICIAN:  [name], M.D.DATE OF BIRTH:  01/10/1946   DATE OF ADMISSION:  05/21/2007  DATE OF DISCHARGE:  05/28/2007                               DISCHARGE SUMMARY   CHIEF COMPLAINT:  Cerebral aneurysm.   HISTORY OF PRESENT ILLNESS:  This is a very pleasant 65 year old female  who was admitted to [facility] on May 21, 2007, by Dr.  Jeral Mccormick for evaluation of a headache, difficulty walking and visual  changes in her right eye.  These symptoms apparently had been going on  for several weeks.  The patient had a CT scan of the head without  contrast that revealed a large basilar tip aneurysm.  There were chronic  microvascular ischemic changes as well.  Dr. Corliss Mccormick was consulted and  the patient was taking taken urgently to the interventional radiology  lab where she underwent endovascular repair of her aneurysm performed by  Dr. Corliss Mccormick under general anesthesia.  The patient was admitted to the  neuro intensive care unit following the procedure.   PAST MEDICAL HISTORY:  The patient has a questionable history of  paroxysmal atrial fibrillation.  She has rheumatoid arthritis.  She has  COPD, gastroesophageal reflux disease, hypertension, history of  peripheral edema.  She has had elevated glucose levels secondary to  steroid therapy.  She apparently uses oxygen at home.   SURGICAL HISTORY:  The patient has had surgery on both hands.   ALLERGIES:  The patient is allergic to INJECTABLE GOLD; the exact nature  of the reaction is unknown at this time.   MEDICATIONS ON ADMISSION:  Include:  1. Prilosec 20 mg daily.  2. Prednisone 10 mg daily.  3. Lexapro 10 mg daily.  4. Bisoprolol 5 mg daily.  5. Aldactone/hydrochlorothiazide 25/25 one daily.  6. Spiriva one puff daily.  7.  Advair one puff twice daily.  8. Albuterol two puffs every 4-6 hours as needed.   SOCIAL HISTORY:  The patient lives in [place] with a friend.  She has  a remote history of tobacco use significant for a 45-pack-year history.  She does not use alcohol.  She no longer smokes.   FAMILY HISTORY:  There is no apparent family history of cerebral  aneurysms.   HOSPITAL COURSE:  As noted, this patient was admitted through the  emergency department by Dr. Jeral Mccormick for evaluation of a headache,  difficulty walking and visual problems with her right eye.  She was  found to have a rather large basilar apex aneurysm which measured  approximately 15 x 15 mm.  She underwent coiling of the aneurysm by Dr.  Corliss Mccormick performed on an emergent basis under general anesthesia.  The  patient was also noted to have a 5.5 x 4.5 mm left internal carotid  artery terminus aneurysm.  This was not treated at that time.   The patient was admitted to the neuro intensive care unit where she was  monitored closely.  She did have some mild renal insufficiency.  She has  a history of COPD as well as obstructive sleep apnea.  Apparently she  refuses CPAP at home but does use oxygen at home on a p.r.n. basis.  Dr.  Nehemiah Mccormick was consulted to follow her medical problems.  She was initially  taken off her hydrochlorothiazide secondary to hyponatremia.  This was  later restarted prior to discharge.   The patient actually did very well throughout her stay.  She did have  some headaches as well as some pain from her rheumatoid arthritis.  She  took ibuprofen for her arthritis pain.  As noted, she did have some mild  renal insufficiency.  She was also noted to be mildly anemic throughout  her stay.   The patient continued to do well.  She was eventually felt to be ready  for discharge on May 28, 2007, in improved and stable condition.   LABORATORY DATA:  The most recent basic metabolic panel on the day of  discharge  revealed a BUN of 19, creatinine was 1.08, her GFR was 52,  potassium was 3.7, glucose was mildly elevated at 122.  A CBC on the day  of discharge revealed hemoglobin 10.1; hematocrit 30.3; wbc's were  11,800; platelets were 418,000.  The patient was afebrile.  A BNP was  performed on February 4 that was mildly elevated at 169.  Triglycerides  on February 4 were 97.  A chest x-ray on February 3 showed no acute  infiltrate or pleural effusion.  She did have some mild bilateral  basilar atelectasis.   DISCHARGE MEDICATIONS:  The patient was told to continue her home  medications at the time of discharge.  She was also to take aspirin 325  mg daily.  This was to be an enteric-coated aspirin.  The patient had  previously had some stomach upset with aspirin.  We told her to call if  she had any symptoms with this current dose of aspirin or this current  regimen.   The patient was given instructions regarding wound care.  She was told  not to drive or do anything strenuous for at least 2 weeks.  She was to  return to see Dr. Corliss Mccormick in 2 weeks.  She was to follow up with Dr.  Nehemiah Mccormick in 1-2 weeks.  She would follow up with Dr. Jeral Mccormick as instructed.   DISCHARGE DIAGNOSES:  1. Large symptomatic basilar apex aneurysm measuring approximately 15      mm x 15 mm.  2. Endovascular coiling of the aneurysm performed on May 21, 2007,      by Dr. Corliss Mccormick under general anesthesia.  3. 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  4. Chronic obstructive pulmonary disease requiring home oxygen      therapy.  5. Remote tobacco history.  6. Questionable history of paroxysmal atrial fibrillation.  7. Rheumatoid arthritis.  8. Gastroesophageal reflux disease.  9. Previous hand surgery.  10.Obstructive sleep apnea; the patient has refused continuous      positive airway pressure.  11.Mild renal insufficiency.   12.Mild anemia.  13.History of hypertension.  14.History of peripheral edema.  15.Mildly elevated glucose and mildly elevated white blood cell count      felt secondary to steroids.  16.Mild visual problems in her right eye noted at time of admission.  17.Headaches felt related to her aneurysm.  Delton See, P.A.    ______________________________  Grandville Silos. [name], M.D.    DR/MEDQ  D:  05/28/2007  T:  05/30/2007  Job:  [id number]   cc:   Hilda Lias, M.D.  Deirdre Peer. Polite, M.D.

## 2010-09-03 NOTE — Consult Note (Signed)
NAMEKONYA, FAUBLE            ACCOUNT NO.:  [id number]   MEDICAL RECORD NO.:  [id number]          PATIENT TYPE:  OUT   LOCATION:  XRAY                         FACILITY:  [facility]   PHYSICIAN:  Sanjeev K. Deveshwar, M.D.DATE OF BIRTH:  25-Sep-1945   DATE OF CONSULTATION:  DATE OF DISCHARGE:                                 CONSULTATION   DATE OF DICTATION:  January 25, 2008.   CHIEF COMPLAINT:  Status post endovascular treatment of cerebral  aneurysms, the most recent on January 11, 2008.   BRIEF HISTORY:  This is a very pleasant 65 year old female who was  initially referred to Dr. [name] through the courtesy of Dr. [name]  after the patient presented to [facility] Emergency Department  on May 21, 2007, with a severe headache and was found to have a  giant basilar apex artery aneurysm.  The patient underwent coiling of  the aneurysm on the day of admission.  At that time, she was also noted  to have a second aneurysm.  She underwent stent-assisted coiling of the  second aneurysm on Aug 24, 2007.  She was brought back to [facility] on January 11, 2008, for stent-assisted coiling of the neck  of the giant basilar artery aneurysm that was initially coiled in  January 2009.   The patient was discharged from [facility] after her most  recent stent-assisted coiling on January 12, 2008, only to be  readmitted on January 16, 2008, by Dr. [name] for a severe intractable  headache.  Dr. [name] recommended treating the patient with  nonsteroidal anti-inflammatory drugs or steroids at that time for  possible inflammation; however, the patient has a history of anemia and  although initially placed on ibuprofen, this was discontinued.  She does  not tolerate steroids due to her hyperglycemia.  She has been taking  Vicodin for her headache pain.  She returns today to be seen in followup  by Dr. [name].   PAST MEDICAL HISTORY:  1. Significant for  the above-noted aneurysms.  2. Hypertension.  3. COPD followed by Dr. Sherene Sires.  4. History of paroxysmal atrial fibrillation.  5. History of rheumatoid arthritis.  6. Gastroesophageal reflux disease.  7. Glucose intolerance.  8. Probable diabetes.  9. Obstructive sleep apnea, although she does not tolerate CPAP.  10.She also has mild renal insufficiency.   ALLERGIES:  She is allergic to INJECTABLE GOLD and TAPE ADHESIVES.   CURRENT MEDICATIONS:  Please see the recent discharge summary for list  of her medications.  She was discharged on Vicodin for her headache  pain.  She has been on aspirin and Plavix due to her recent stent  placement.   SOCIAL HISTORY:  The patient lives in [place] with a friend.  Her  husband passed away, approximately 3 years ago.  The patient has a  remote history of tobacco use.  She has quit smoking, and she did have a  45-pack year history.  She has COPD.  She does not use alcohol.   FAMILY HISTORY:  There is no family history of cerebral aneurysms.   SURGICAL HISTORY:  The patient has had surgery on both hands.  She  reports that she has had problems waking up following general  anesthesia.   IMPRESSION/PLAN:  As noted, the patient returns today to be seen in  followup by Dr. [name] following a recent stent-assisted coiling of  the neck of a giant basilar artery aneurysm, performed on January 11, 2008.  She was readmitted on January 16, 2008, for an intractable  headache.  Her workup was negative for any acute findings on her MRI.  The patient is accompanied by her friend today for her followup visit.  She does report that she still has headaches, although they do appear to  be improving.  She takes Vicodin as needed for the pain.  She is still  on aspirin and Plavix.  Dr. [name] recommended changing the Plavix to  1 every other day for 1 more month and then discontinuing the Plavix.  She is also on aspirin 325 mg daily.  She is to  continue taking the  aspirin after she stops the Plavix.  The patient may resume driving  although Dr. [name] cautioned her not to drive while taking Vicodin.  A repeat angiogram was recommended in approximately 4 months for further  evaluation of her aneurysms.   Dr. [name] did review the images from the most recent stent-assisted  coiling with the patient and her friend.  All of her questions were  answered.  Greater than 15 minutes was spent on this followup visit.      Delton See, P.A.    ______________________________  Grandville Silos. [name], M.D.    DR/MEDQ  D:  01/25/2008  T:  01/26/2008  Job:  [id number]   cc:   Deirdre Peer. Polite, M.D.  Hilda Lias, M.D.

## 2010-09-03 NOTE — Discharge Summary (Signed)
[name], [name]            ACCOUNT NO.:  [id number]   MEDICAL RECORD NO.:  [id number]          PATIENT TYPE:  INP   LOCATION:  3106                         FACILITY:  [facility]   PHYSICIAN:  Sanjeev K. Deveshwar, M.D.DATE OF BIRTH:  14-Oct-1945   DATE OF ADMISSION:  08/24/2007  DATE OF DISCHARGE:  08/26/2007                               DISCHARGE SUMMARY   CHIEF COMPLAINT:  Cerebral aneurysm.   HISTORY OF PRESENT ILLNESS:  This is a 65 year old female who is well  known to Dr. [name], had an original cerebral aneurysms coiled on  May 21, 2007.  The patient did quite well.  She returns here today,  to have a different aneurysm coiled, by Dr. [name].  The patient  continues to do well.  She has no complaints that are bothering her,  right now.  She comes in to Short Stay today, and then to the ER, and  then to Radiology, for her aneurysm coiling.   PAST MEDICAL HISTORY:  1. Paroxysmal atrial fibrillation.  2. Rheumatoid arthritis.  3. COPD.  4. Gastroesophageal reflux disease.  5. Hypertension.  6. Occasional glucose level abnormality.   PAST SURGICAL HISTORY:  She has had surgery on both hands.   ALLERGIES:  INJECTABLE GOLD.   MEDICATIONS ON ADMISSION:  1. Prilosec 20 mg.  2. Prednisone 10 mg.  3. Lexapro 10 mg.  4. Bisoprolol 5 mg.  5. Aldactone/hydrochlorothiazide 25/25.  6. Spiriva 1 puff daily.  7. Advair, as needed.  8. Albuterol, as needed.  9. We also added Plavix 75 mg once a day.   SOCIAL HISTORY:  She is a remote tobacco user.  She has quit smoking at  this point.  She lives here in [place].   FAMILY HISTORY:  She has no family history of cerebral aneurysms.   HOSPITAL COURSE:  The patient comes in on Aug 25, 2007 for cerebral  aneurysm coiling.  Coiling is performed by Dr. [name] with [facility]  Radiology in the Neurointerventional Radiology Suite.  The hospital  course, she spends the night in Neuro ICU.  She does quite well and is  discharged on Aug 26, 2007.   DISCHARGE MEDICATIONS:  Again, her only change in medications at  discharge is, we did add Plavix 75 mg daily.   DISCHARGE PLAN:  The patient is to return to see Dr. [name] on Sep 07, 2007, at [facility] clock, p.m., here at [facility].  She leaves here  today with her family friend and has a good understanding of this plan.  She is to call with any other questions or concerns to [phone], and if  there is any problem or questions, she can call that number.      Edwardsville Cellar Carleene Mains, P.A.    ______________________________  Grandville Silos [name], M.D.    Damita/MEDQ  D:  08/26/2007  T:  08/27/2007  Job:  [id number]

## 2010-09-03 NOTE — Discharge Summary (Signed)
NAME:  [name], [name]            ACCOUNT NO.:  [id number]   MEDICAL RECORD NO.:  [id number]          PATIENT TYPE:  OIB   LOCATION:  3105                         FACILITY:  [facility]   PHYSICIAN:  Sanjeev K. 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   HISTORY OF PRESENT ILLNESS:  This is a pleasant 65 year old female who  was initially referred to Dr. [name] through the courtesy of Dr.  [name] after the patient presented to the [facility] Emergency  Department on May 21, 2007, with a severe headache.  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  The aneurysm measured  approximately 16 x 17 mm.  She underwent coiling of the aneurysm on the  day of admission.   At that time, the patient was also noted to have a second aneurysm,  which was located in the left internal carotid artery and measured 5.5 x  4.5 mm.  She underwent stent-assisted coiling of that aneurysm on Aug 24, 2007.   The patient has been followed closely since her initial presentation.  She was found to have some recannulization of the neck portion of the  giant basilar apex aneurysm, and a decision was made to have the patient  return to [facility] on January 11, 2008, for further  intervention.  The patient was started on Plavix 3 days prior to  admission in anticipation of possible stent placement.   PAST MEDICAL HISTORY:  1. Significant for the above-noted aneurysms.  2. She has a history of hypertension.  3. COPD, followed by Dr. Sherene Sires.  4. History of paroxysmal atrial fibrillation.  5. History of rheumatoid arthritis.  6. Gastroesophageal reflux disease.  7. Glucose intolerance.  8. Obstructive sleep apnea, although she does not tolerate CPAP.  9. She has also had elevated glucose levels but tells me she has not      been diagnosed with diabetes.  10.She has some mild renal insufficiency.   ALLERGIES:  She is allergic to INJECTABLE GOLD and TAPE ADHESIVES.   Medications at the time of admission included Spiriva inhaler, Advair  inhaler, Pro-Air inhaler, omeprazole, prednisone, bisoprolol,  spironolactone, hydrochlorothiazide, enteric-coated aspirin, Centrum  Silver, Plavix, and zolpidem.   SOCIAL HISTORY:  The patient lives in [place] with a friend.  Her  husband passed away approximately 3 years ago.  She is still dealing  with emotional aspects of that situation.  She has a remote history of  tobacco use.  She has since quit smoking.  She did smoke for about 45  years.  She has COPD.  She does not use alcohol.   FAMILY HISTORY:  There is no family history of cerebral aneurysms.   SURGICAL HISTORY:  The patient has had surgery on both hands.  She  reports that she has problems waking up after general anesthesia.   HOSPITAL COURSE:  As noted, this  patient presented to [facility] on January 11, 2008, for further treatment of a giant basilar  apex aneurysm that had previously been coiled on May 21, 2007, and  has since developed some recannulization at the neck portion.  On the  day of admission, a cerebral angiogram repeated, and the patient  underwent stent-assisted coiling of the neck of the aneurysm, performed  by Dr. [name] under general anesthesia.  She tolerated this well.   The patient does have a history of renal insufficiency.  Because of  this, her diuretics were stopped approximately 3 days prior to  admission.  She was also placed on a sodium bicarb protocol at the time  of admission and thoroughly hydrated.  Her renal function remained  stable, although she did develop an anemia following the procedure,  which was felt to be in part due to fluid hydration.  Followup with her  primary  care physician has been recommended.   The patient was admitted to the neuro intensive care unit following her  intervention.  She remained on IV heparin overnight.  The following day,  the right femoral groin sheath was removed.  The patient remained on  bedrest for 6 hours and then was ambulated as noted.  Her hemoglobin was  low.  This was repeated and appeared to be stable.  The patient was  opposed to transfusion.  She also had some mild hypotension, although  she was not symptomatic with this.  The patient was discharged on the  evening of January 11, 2008, with plans for followup with her primary  care physician in approximately 1 week to have a repeat CBC.  Her blood  pressure was stable at the time of discharge.   LABORATORY DATA:  A CBC on the day of discharge revealed hemoglobin 9.8,  hematocrit 30.9, WBCs were 11.7000.  She is on prednisone.  Her platelet  count was 312,000.  A CBC on the morning of discharge had revealed a  hemoglobin of 8.3, hematocrit 26.1, WBCs 11.9000, platelets 297,000.  On  January 05, 2008, her hemoglobin had been 11.2, hematocrit 34.1, WBCs  13.2000, platelets 398,000.  On January 05, 2008, BUN was 22,  creatinine 1.22.  Her GFR was 45 on the day of discharge.  Her BUN was  11.  Her creatinine was 0.91.  Her potassium was 4.1, sodium 134,  glucose 109, GFR was greater than 60.   The patient also had some mild reflux symptoms on the day of discharge.  This was treated with IV Pepcid and Mylanta.  She was told to continue  her omeprazole following discharge.   DISCHARGE INSTRUCTIONS:  The patient was told to continue with her  previous medications as noted.  She will also be on Plavix 75 mg daily  for at least 2-4 weeks.  This will be determined at her next followup  appoint with Dr. [name].  She will also remain on aspirin 81 mg daily  while on her Plavix.   The patient was given instructions regarding wound care.  She was told  to stay  on her previous diet.  She was to avoid any strenuous activity  and driving for at least 2 weeks.  A followup angiogram will be repeated  in approximately 3 months.   The patient will see Dr. [name] in followup in approximately 2 weeks.  She was told to follow up with her primary care physician, Dr. [name],  within 1 week for a repeat CBC  and blood pressure check.   Problem list at the time of discharge:  1. Status post stent-assisted coiling of a giant basilar apex aneurysm      neck, performed on January 11, 2008.  2. Previous coiling of this aneurysm, performed on May 21, 2007,      by Dr. [name].  3. 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 [name].  4. Anemia during her stay, felt secondary to IV fluid hydration.  5. History of renal insufficiency, which remained stable and somewhat      improved at the time of discharge.  6. Mild gastritis and reflux, treated with Mylanta and Pepcid during      her stay.  7. History of hypertension.  8. History of severe COPD.  9. Paroxysmal atrial fibrillation.  10.Rheumatoid arthritis.  11.Gastroesophageal reflux disease.  12.Glucose intolerance.  13.Obstructive sleep apnea.  14.History of anxiety and depression.      Delton See, P.A.    ______________________________  Grandville Silos. [name], M.D.    DR/MEDQ  D:  01/13/2008  T:  01/14/2008  Job:  [id number]   cc:   Deirdre Peer. Polite, M.D.  Charlaine Dalton. Sherene Sires, MD, [facility]  Hilda Lias, M.D.

## 2010-09-03 NOTE — Consult Note (Signed)
NAME:  [name], [name]            ACCOUNT NO.:  [id number]   MEDICAL RECORD NO.:  [id number]          PATIENT TYPE:  OUT   LOCATION:  XRAY                         FACILITY:  [facility]   PHYSICIAN:  Sanjeev K. Deveshwar, M.D.DATE OF BIRTH:  [birth year]   DATE OF CONSULTATION:  06/07/2007  DATE OF DISCHARGE:                                 CONSULTATION   CONSULTATION NOTE   CHIEF COMPLAINT:  Status post endovascular coiling of a large apex  aneurysm performed May 21, 2007.   HISTORY OF PRESENT ILLNESS:  As a very pleasant 65 year old female who  was admitted to [facility] by Dr. Hilda Lias on May 21, 2007 for evaluation of severe headaches and visual changes.  A CT scan  showed a large basal apex aneurysm measuring approximately 15 x 15 mm.  Dr. [name] was asked to see the patient.  Because she was  symptomatic, she was taken emergently to the to the radiology  interventional lab and underwent coiling of the basilar apex aneurysm  under general anesthesia, performed by Dr. [name] without  complications.  The patient was subsequently admitted to the neuro-  intensive care unit, where she remained for approximately 1 week.  She  was seen by her primary care physician during that stay, Dr. [name].  She was eventually discharged in improved and stable condition on  May 28, 2007.  The patient returns today to be seen in follow-up.   During her angiogram performed on May 21, 2007, she was also found  to have a second aneurysm which measured 5.5 mm x 4.5 mm in the left  internal carotid artery.  This was not treated during the initial  intervention, and plans will be made to have the patient return in  approximately 3 months for treatment of the second aneurysm.   PAST MEDICAL HISTORY:  Significant for hypertension, COPD.  She is on  home oxygen therapy.  She also has obstructive sleep apnea but does not  tolerate CPAP.  She has gastroesophageal reflux  disease.  There is a  question of paroxysmal atrial fibrillation.  She has rheumatoid  arthritis.  She had elevated glucose levels while in the hospital, felt  secondary to steroids.  She also had some mild renal insufficiency.   ALLERGIES:  SHE IS ALLERGIC TO INJECTABLE GOLD.   MEDICATIONS:  The patient had been on Prilosec, prednisone, Lexapro,  bisoprolol, Aldactone/hydrochlorothiazide, Spiriva and Advair prior to  admission, enteric-coated aspirin 325 mg daily was also started prior to  discharge.  She uses albuterol 2 puffs every 4 to 6 hours as needed.   SOCIAL HISTORY:  The patient lives in [place] with a friend.  She  lost her husband approximately 2 years ago.  She is still having some  problems dealing with this situation.  She has a remote history of  tobacco use.  She smoked for 45 years.  She did quit smoking.  She does  not use alcohol.   IMPRESSION AND PLAN:  As noted, the patient returns today to discuss  treatment of a second cerebral aneurysm.  It was found during the  treatment of the first aneurysm on May 21, 2007.  She reports she  has been doing well.  She did develop a rash while in the hospital.  This has been pruritic and covers most of her body.  She has been taking  Benadryl for these symptoms.  We are not certain at this time as to the  etiology of the rash.  Aspirin appears to be her only medication.   The patient is also continuing to have some visual problems.  Her vision  is blurred.  She still has a mild ptosis.  There is a question of some  diplopia.  She has continued to have some mild frontal headaches, as  well.  She reports that she is breathing okay.  Her strength is  returning.   Dr. [name] reviewed the results of the previous and coiling with the  patient.  She was shown images of the aneurysm before and after the  coiling.  She was also shown the residual left internal carotid artery  aneurysm.  Plans were made to have her return  approximately 3 months to  treat that aneurysm.  We plan to start the patient on Plavix 3 days  prior to admission.  She was given a prescription during this visit.   Greater than 10 minutes was spent on this follow-up.      Delton See, P.A.    ______________________________  Grandville Silos. [name], M.D.    DR/MEDQ  D:  06/07/2007  T:  06/08/2007  Job:  [id number]   cc:   Hilda Lias, M.D.  Deirdre Peer. Polite, M.D.

## 2010-09-06 NOTE — Assessment & Plan Note (Signed)
[name]                             PULMONARY OFFICE NOTE   NAME:[name], [name]                     MRN:          [id number]  DATE:03/24/2006                            DOB:          [birth year]    This is a pulmonary/final followup visit.   HISTORY:  [name], patient of Dr. Bartholomew Mccormick, who  when I first saw her was steroid-dependent with COPD since 2004, but now  has tapered her medicines down to the point where she is just using  Advair 250/50 one daily and Spiriva 1 daily, with her spirometry showing  an FEV1 of 67% predicted, documented in August of 2007.  She rarely if  ever needs albuterol, denies any nocturnal awakening, using oxygen 2  liters at bedtime.   MEDICATIONS:  For a complete inventory of her medications, please see  the face sheet column dated March 24, 2006, which reflects the  medication calendar that she brings with her, and she says is accurate  and updated.   PHYSICAL EXAMINATION:  She is a pleasant, ambulatory, white female in no  acute distress.  She is afebrile, stable vital signs.  HEENT:  Unremarkable, oropharynx is clear.  LUNGS:  Lung fields reveal diminished breath sounds but no wheezing.  CARDIAC:  There is a regular rhythm without murmur, gallop or rub.  ABDOMEN:  Soft, benign.  EXTREMITIES:  Warm without calf tenderness, cyanosis, clubbing or edema.   Hemoglobin saturation is 94% on room air.   IMPRESSION:  1. Chronic obstructive pulmonary disease with no active asthmatic      component.  In fact, she has tapered herself off of Advair to the      point where she just takes it once daily.  I have advised her,      though, if she starts having flare-ups for any reason she needs to      go back to the b.i.d. dosing.  2. The dependent edema she has previously may represent a component of      cor pulmonale, and note that the patient has already qualified for      nocturnal oxygen, which  I have asked her to continue.   The main challenge long-term will be for this patient to maintain off of  cigarettes (she has now been off a year successfully) and controlling  her weight.  Both of these issues can be handled through Dr. Bartholomew Mccormick  office better than here, and I will see the patient back on a p.r.n.  basis only.    [name]. Jaime Sires, MD, [facility]  Electronically Signed   MBW/MedQ  DD: 03/24/2006  DT: 03/25/2006  Job #: [id number]   cc:   [name], M.D.

## 2010-09-06 NOTE — Discharge Summary (Signed)
NAMESANIA, NOY            ACCOUNT NO.:  [id number]   MEDICAL RECORD NO.:  [id number]          PATIENT TYPE:  INP   LOCATION:  6742                         FACILITY:  [facility]   PHYSICIAN:  Deirdre Peer. Polite, M.D. DATE OF BIRTH:  [birth year]   DATE OF ADMISSION:  03/29/2007  DATE OF DISCHARGE:  04/01/2007                               DISCHARGE SUMMARY   DISCHARGE DIAGNOSES:  1. Chronic obstructive pulmonary disease exacerbation.  2. Leukocytosis secondary to infection plus steroids.  3. Hyponatremia, probably on the basis of ascites secondary to the      pulmonary process plus or minus diuretic.  Diuretic was held during      the hospitalization.   DISCHARGE MEDICATIONS:  The patient was asked to hold her Aldactazide  until seen by MD on an outpatient basis.  She is also discharged on  Prilosec 20 mg daily, Lexapro daily, Spiriva daily, Ambien 10 mg q.h.s.,  Advair 250/50, bisoprolol, Boniva monthly.  She is to continue on Avelox  400 mg x3 days, prednisone taper 10 mg to 5 mg daily.   STUDIES:  Chest x-ray:  No pneumonia.  CBC:  White count 33, hemoglobin  10, platelet 371.  BMET significant for hyponatremia of 127.  Serum osm  249.  Cortisol level 46.   HOSPITAL COURSE:  A 65 year old female with known history of COPD  presented to the emergency department with complaints of shortness of  breath and wheezing.  In the ED the patient was evaluated. Admission was  deemed necessary for further evaluation and treatment of COPD  exacerbation.  Please see dictated H&P for further details.   HOSPITAL COURSE:  The patient was admitted to the medicine floor bed for  evaluation and treatment of COPD exacerbation.  The patient was treated  with antibiotics, O2, nebs and steroids.  The patient had followup x-ray  without pneumonia, had significant improvement in her respiratory  status.  She had labs only to evaluate for hyponatremia, which was  slowly improving with the holding of her  diuretic.  On December 10, the  patient was at her baseline, was felt to be stable for discharge.   DISCHARGE DIAGNOSES:  1. Chronic obstructive pulmonary disease exacerbation, improved.      Continue antibiotics.  2. Hyponatremia, improved.  Again, differential includes SIADH on the      basis of pulmonary process plus or minus her diuretic.  She is to      hold the diuretic until outpatient followup.  3. Leukocytosis, improved.  4. Chronic anemia.  5. Transient arrhythmia, i.e., tachycardia, more than likely secondary      to albuterol.  Did not have any recurrence after being started on      Xopenex.   The patient was discharged to home in stable condition and asked to  follow up with MD.      Deirdre Peer. Polite, M.D.  Electronically Signed     RDP/MEDQ  D:  04/28/2007  T:  04/28/2007  Job:  [id number]

## 2010-09-06 NOTE — Assessment & Plan Note (Signed)
[facility]                               PULMONARY OFFICE NOTE   NAME:[name], [name]                     MRN:          [id number]  DATE:11/24/2005                            DOB:          [birth year]    HISTORY:  The patient is a 65 year old white female, remote smoker, in for  followup PFTs as requested, having completely tapered off of prednisone with  no significant flare.  Her main concern has been one of intermittent leg  swelling for which she was prescribed furosemide.  She did not know how to  take while taking Aldactazide, but it did seem to be very effective  (previously she could not tolerate Aldactazide because every time she took a  full dose, it caused her back and kidney area to hurt but tolerated a half  a pill a day without side effects but not adequate control of edema).   Presently, she denies any significant change in dyspnea on exertion, fever,  chills, sweats, orthopnea, PND, or leg swelling.   MEDICATIONS:  For complete inventory of medications, please see Face sheet  dated November 24, 2005.   PHYSICAL EXAMINATION:  GENERAL:  She is a pleasant ambulatory white female  in no acute distress.  VITAL SIGNS:  Stable vital signs.  HEENT:  Unremarkable.  Pharynx clear.  LUNGS:  Lung fields revealed diminished breath sounds bilaterally.  No  wheezing.  HEART:  Regular rate and rhythm without murmurs, gallops, or rubs.  ABDOMEN:  Soft, benign.  EXTREMITIES:  Without calf tenderness.  No clubbing.   DIAGNOSTIC STUDIES:  PFTs were reviewed today and indicated an FEV1 of 56%  predicted with an 18% improvement after bronchodilators.   IMPRESSION:  Chronic obstructive pulmonary disease with a definite asthmatic  component for which she is maintained now on Advair at 250/50 one twice  daily and Spiriva one daily.  Note that her baseline FEV1 is quite a bit  higher than it was even while on prednisone, so I think she is on the right  track in terms of appropriate medication directed at chronic obstructive  pulmonary disease.   The patient wanted to discuss several other issues, and I spent an extra 15-  20 minutes on these as follows:   1.  First, she wanted to make sure that her edema was not due to heart      failure.  I reviewed with her recent echocardiographic findings that      would indicate this to be unlikely.  2.  She is unable to wear oxygen at night which I think is critical because      we did document nocturnal desaturation, and this most likely is the      cause of right heart strain and edema.  I have asked her to contact her      oxygen company to see if she can find another way to supply the same      amount of oxygen (even including a face mask).  3.  I have reviewed with her a diuretic regimen which will include using  half a tablet of Aldactazide daily and then as needed Lasix.  4.  I did review pulmonary function tests before and after prednisone      therapy to emphasize to her that she is actually doing better than she      was on prednisone and that she should continue off her prednisone from a      pulmonary perspective.                                   Charlaine Dalton. Sherene Sires, MD, [facility]   MBW/MedQ  DD:  11/24/2005  DT:  11/25/2005  Job #:  [id number]   cc:   Lemmie Evens, MD  Ladell Pier, MD

## 2010-09-06 NOTE — Assessment & Plan Note (Signed)
[facility]                               PULMONARY OFFICE NOTE   NAME:Mccormick, Jaime                     MRN:          [id number]  DATE:01/27/2006                            DOB:          1945-11-07    HISTORY OF PRESENT ILLNESS:  The patient is a 65 year old white female  patient of Dr. Thurston Hole who has a known history of COPD and asthmatic  bronchitis.  Presents for a 23-month follow up and medication review.  Patient reports she has been doing well up until the last couple of weeks in  which she developed what she describes as an upper respiratory infection.  She was seen by her primary care physician and prescribed an antibiotic and  prednisone taper and she reports her symptoms have totally resolved.  The  patient has brought all of her medications in today for review which are  correct with our medication list.   PAST MEDICAL HISTORY:  Reviewed.   CURRENT MEDICATIONS:  Reviewed.   PHYSICAL EXAM:  Patient is a pleasant female in no acute distress, she is  afebrile with stable vital signs.  HEENT:  Unremarkable.  NECK:  Supple without adenopathy.  LUNG SOUNDS:  Reveal diminished breath sounds in the bases, otherwise clear.  CARDIAC:  Regular rate and rhythm.  ABDOMEN:  Soft and benign.  EXTREMITIES:  Warm without any calf cyanosis, clubbing or edema.   IMPRESSION AND PLAN:  1. Chronic obstructive pulmonary disease with a definite asthmatic      component, well compensated on her current regimen.  Patient will      return back with Dr. Sherene Sires in 6-8 weeks or sooner if needed.  2. Complex medication regimen.  Patient's medications were reviewed.      Patient education      was provided.  A computerized medication calendar was adjusted      accordingly and reviewed with patient.     ______________________________  Rubye Oaks, NP    ______________________________  Charlaine Dalton. Sherene Sires, MD, [facility]   TP/MedQ  DD:  02/02/2006 DT:   02/03/2006 Job #:  [id number]

## 2011-01-09 LAB — BASIC METABOLIC PANEL
BUN: 15
Calcium: 8.2 — ABNORMAL LOW
Chloride: 102
Creatinine, Ser: 0.89
GFR calc Af Amer: >60
GFR calc non Af Amer: 56 — ABNORMAL LOW
Potassium: 4.1
Potassium: 4.2
Sodium: 135

## 2011-01-09 LAB — I-STAT 8, (EC8 V) (CONVERTED LAB)
Acid-Base Excess: 2
Hemoglobin: 13.9
Potassium: 4.1
Sodium: 130 — ABNORMAL LOW
TCO2: 31

## 2011-01-09 LAB — BLOOD GAS, ARTERIAL
Bicarbonate: 26.2 — ABNORMAL HIGH
Bicarbonate: 26.2 — ABNORMAL HIGH
FIO2: 0.4
TCO2: 27.5
TCO2: 27.8
pCO2 arterial: 41.8
pCO2 arterial: 49.9 — ABNORMAL HIGH
pH, Arterial: 7.34 — ABNORMAL LOW
pH, Arterial: 7.413 — ABNORMAL HIGH
pO2, Arterial: 134 — ABNORMAL HIGH

## 2011-01-09 LAB — CBC
HCT: 31.7 — ABNORMAL LOW
HCT: 35.6 — ABNORMAL LOW
Hemoglobin: 11.7 — ABNORMAL LOW
Platelets: 378
Platelets: 393
RDW: 17.4 — ABNORMAL HIGH
WBC: 14.8 — ABNORMAL HIGH
WBC: 14.9 — ABNORMAL HIGH

## 2011-01-09 LAB — DIFFERENTIAL
Basophils Relative: 1
Lymphocytes Relative: 9 — ABNORMAL LOW
Monocytes Absolute: 0.7
Monocytes Relative: 5
Neutro Abs: 12.5 — ABNORMAL HIGH

## 2011-01-09 LAB — PROTIME-INR
INR: 1
Prothrombin Time: 13.8

## 2011-01-09 LAB — MAGNESIUM: Magnesium: 1.5

## 2011-01-09 LAB — PHOSPHORUS: Phosphorus: 3.5

## 2011-01-09 LAB — POCT I-STAT CREATININE
Creatinine, Ser: 1.4 — ABNORMAL HIGH
Operator id: 294501

## 2011-01-09 LAB — APTT: aPTT: 27

## 2011-01-10 LAB — BASIC METABOLIC PANEL
BUN: 14
BUN: 19
CO2: 28
CO2: 30
Calcium: 8.5
Calcium: 8.5
Chloride: 101
Chloride: 98
GFR calc Af Amer: >60
GFR calc Af Amer: >60
GFR calc non Af Amer: 52 — ABNORMAL LOW
GFR calc non Af Amer: >60
Glucose, Bld: 122 — ABNORMAL HIGH
Glucose, Bld: 73
Glucose, Bld: 92
Potassium: 3.5
Potassium: 3.7
Potassium: 3.9
Sodium: 132 — ABNORMAL LOW
Sodium: 135
Sodium: 138
Sodium: 139

## 2011-01-10 LAB — TRIGLYCERIDES
Triglycerides: 70
Triglycerides: 97

## 2011-01-10 LAB — CBC
HCT: 28.3 — ABNORMAL LOW
HCT: 30.3 — ABNORMAL LOW
HCT: 31.1 — ABNORMAL LOW
Hemoglobin: 10.1 — ABNORMAL LOW
Hemoglobin: 10.3 — ABNORMAL LOW
Hemoglobin: 9.2 — ABNORMAL LOW
MCHC: 33.2
MCHC: 33.2
MCV: 81.4
Platelets: 418 — ABNORMAL HIGH
RBC: 3.82 — ABNORMAL LOW
RDW: 17.4 — ABNORMAL HIGH
RDW: 17.5 — ABNORMAL HIGH
WBC: 13.6 — ABNORMAL HIGH

## 2011-01-16 ENCOUNTER — Other Ambulatory Visit (HOSPITAL_COMMUNITY)
Admission: RE | Admit: 2011-01-16 | Discharge: 2011-01-16 | Disposition: A | Discharge status: Home / Self Care | Payer: Medicare Other | Source: Ambulatory Visit | Attending: Obstetrics and Gynecology | Admitting: Obstetrics and Gynecology

## 2011-01-16 ENCOUNTER — Other Ambulatory Visit: Payer: Self-pay | Admitting: Nurse Practitioner

## 2011-01-16 DIAGNOSIS — Z01419 Encounter for gynecological examination (general) (routine) without abnormal findings: Secondary | ICD-10-CM | POA: Insufficient documentation

## 2011-01-16 DIAGNOSIS — Z1159 Encounter for screening for other viral diseases: Secondary | ICD-10-CM | POA: Insufficient documentation

## 2011-01-20 LAB — BASIC METABOLIC PANEL
BUN: 22
Calcium: 7.2 — ABNORMAL LOW
Calcium: 8.3 — ABNORMAL LOW
Calcium: 9.3
Creatinine, Ser: 0.91
Creatinine, Ser: 1.22 — ABNORMAL HIGH
GFR calc Af Amer: >60
GFR calc Af Amer: >60
GFR calc non Af Amer: 45 — ABNORMAL LOW
GFR calc non Af Amer: 55 — ABNORMAL LOW
GFR calc non Af Amer: >60
Glucose, Bld: 83
Potassium: 4.3
Sodium: 134 — ABNORMAL LOW
Sodium: 139

## 2011-01-20 LAB — CBC
HCT: 30.9 — ABNORMAL LOW
HCT: 33.3 — ABNORMAL LOW
HCT: 34.1 — ABNORMAL LOW
Hemoglobin: 10.7 — ABNORMAL LOW
Hemoglobin: 8.3 — ABNORMAL LOW
MCV: 76.6 — ABNORMAL LOW
MCV: 76.9 — ABNORMAL LOW
Platelets: 398
Platelets: 429 — ABNORMAL HIGH
RBC: 3.39 — ABNORMAL LOW
RBC: 4.02
RDW: 19.7 — ABNORMAL HIGH
RDW: 19.7 — ABNORMAL HIGH
WBC: 11.7 — ABNORMAL HIGH
WBC: 11.9 — ABNORMAL HIGH
WBC: 13.2 — ABNORMAL HIGH

## 2011-01-20 LAB — DIFFERENTIAL
Basophils Absolute: 0
Basophils Absolute: 0.1
Basophils Relative: 1
Eosinophils Relative: 6 — ABNORMAL HIGH
Lymphocytes Relative: 20
Lymphocytes Relative: 23
Lymphs Abs: 3
Monocytes Absolute: 0.9
Neutro Abs: 7.8 — ABNORMAL HIGH
Neutrophils Relative %: 65
Neutrophils Relative %: 65

## 2011-01-20 LAB — PROTIME-INR
INR: 1
Prothrombin Time: 13.3

## 2011-01-20 LAB — APTT: aPTT: 26

## 2011-01-20 LAB — COMPREHENSIVE METABOLIC PANEL
Albumin: 3.4 — ABNORMAL LOW
Alkaline Phosphatase: 38 — ABNORMAL LOW
BUN: 17
Chloride: 94 — ABNORMAL LOW
Creatinine, Ser: 1.18
GFR calc non Af Amer: 46 — ABNORMAL LOW
Glucose, Bld: 125 — ABNORMAL HIGH
Total Bilirubin: 0.5

## 2011-01-20 LAB — SEDIMENTATION RATE: Sed Rate: 55 — ABNORMAL HIGH

## 2011-01-20 LAB — HEPARIN LEVEL (UNFRACTIONATED): Heparin Unfractionated: <0.1 — ABNORMAL LOW

## 2011-01-23 ENCOUNTER — Other Ambulatory Visit: Payer: Self-pay | Admitting: Internal Medicine

## 2011-01-23 ENCOUNTER — Ambulatory Visit
Admission: RE | Admit: 2011-01-23 | Discharge: 2011-01-23 | Disposition: A | Discharge status: Home / Self Care | Payer: Medicare Other | Source: Ambulatory Visit | Attending: Internal Medicine | Admitting: Internal Medicine

## 2011-01-23 DIAGNOSIS — Z09 Encounter for follow-up examination after completed treatment for conditions other than malignant neoplasm: Secondary | ICD-10-CM

## 2011-01-27 ENCOUNTER — Ambulatory Visit
Admission: RE | Admit: 2011-01-27 | Discharge: 2011-01-27 | Disposition: A | Discharge status: Home / Self Care | Payer: Medicare Other | Source: Ambulatory Visit | Attending: Internal Medicine | Admitting: Internal Medicine

## 2011-01-27 ENCOUNTER — Other Ambulatory Visit: Payer: Self-pay | Admitting: Internal Medicine

## 2011-01-27 ENCOUNTER — Ambulatory Visit
Admission: RE | Admit: 2011-01-27 | Discharge: 2011-01-27 | Disposition: A | Discharge status: Home / Self Care | Payer: PRIVATE HEALTH INSURANCE | Source: Ambulatory Visit | Attending: Internal Medicine | Admitting: Internal Medicine

## 2011-01-27 DIAGNOSIS — Z09 Encounter for follow-up examination after completed treatment for conditions other than malignant neoplasm: Secondary | ICD-10-CM

## 2011-01-27 LAB — POCT I-STAT CREATININE: Creatinine, Ser: 1.8 — ABNORMAL HIGH

## 2011-01-27 LAB — CULTURE, RESPIRATORY W GRAM STAIN
Culture: NORMAL
Gram Stain: NONE SEEN

## 2011-01-27 LAB — BASIC METABOLIC PANEL
BUN: 16
BUN: 23
BUN: 24 — ABNORMAL HIGH
Calcium: 7.2 — ABNORMAL LOW
Creatinine, Ser: 1.65 — ABNORMAL HIGH
GFR calc non Af Amer: 32 — ABNORMAL LOW
GFR calc non Af Amer: 52 — ABNORMAL LOW
GFR calc non Af Amer: >60
Glucose, Bld: 142 — ABNORMAL HIGH
Glucose, Bld: 240 — ABNORMAL HIGH
Potassium: 3.5
Potassium: 4.2
Sodium: 125 — ABNORMAL LOW

## 2011-01-27 LAB — I-STAT 8, (EC8 V) (CONVERTED LAB)
Acid-Base Excess: 3 — ABNORMAL HIGH
BUN: 26 — ABNORMAL HIGH
Bicarbonate: 27.4 — ABNORMAL HIGH
Chloride: 91 — ABNORMAL LOW
HCT: 42
Hemoglobin: 14.3
Operator id: 198171
Sodium: 122 — ABNORMAL LOW
pCO2, Ven: 39.2 — ABNORMAL LOW

## 2011-01-27 LAB — CBC
HCT: 30.9 — ABNORMAL LOW
Hemoglobin: 11.6 — ABNORMAL LOW
MCV: 79.5
Platelets: 391
Platelets: 416 — ABNORMAL HIGH
RBC: 4.41
RDW: 16.2 — ABNORMAL HIGH
RDW: 16.7 — ABNORMAL HIGH
WBC: 22.5 — ABNORMAL HIGH

## 2011-01-27 LAB — CULTURE, BLOOD (ROUTINE X 2)
Culture: NO GROWTH
Culture: NO GROWTH

## 2011-01-27 LAB — EXPECTORATED SPUTUM ASSESSMENT W GRAM STAIN, RFLX TO RESP C

## 2011-01-27 LAB — URINE MICROSCOPIC-ADD ON

## 2011-01-27 LAB — DIFFERENTIAL
Basophils Relative: 0
Lymphocytes Relative: 6 — ABNORMAL LOW
Lymphs Abs: 1.3
Monocytes Absolute: 0.4
Monocytes Relative: 2 — ABNORMAL LOW
Neutro Abs: 20.6 — ABNORMAL HIGH
Neutrophils Relative %: 91 — ABNORMAL HIGH

## 2011-01-27 LAB — URINALYSIS, ROUTINE W REFLEX MICROSCOPIC
Glucose, UA: NEGATIVE
Leukocytes, UA: NEGATIVE
pH: 5.5

## 2011-01-27 LAB — URINE CULTURE: Colony Count: NO GROWTH

## 2011-01-27 LAB — OSMOLALITY: Osmolality: 249 — ABNORMAL LOW

## 2011-01-27 LAB — TSH: TSH: 1.206

## 2011-01-28 ENCOUNTER — Ambulatory Visit
Admission: RE | Admit: 2011-01-28 | Discharge: 2011-01-28 | Disposition: A | Discharge status: Home / Self Care | Payer: Medicare Other | Source: Ambulatory Visit | Attending: Internal Medicine | Admitting: Internal Medicine

## 2011-01-28 DIAGNOSIS — Z09 Encounter for follow-up examination after completed treatment for conditions other than malignant neoplasm: Secondary | ICD-10-CM

## 2011-02-07 ENCOUNTER — Encounter (INDEPENDENT_AMBULATORY_CARE_PROVIDER_SITE_OTHER): Payer: Self-pay | Admitting: General Surgery

## 2011-02-10 ENCOUNTER — Other Ambulatory Visit (INDEPENDENT_AMBULATORY_CARE_PROVIDER_SITE_OTHER): Payer: Self-pay | Admitting: General Surgery

## 2011-02-10 ENCOUNTER — Encounter (INDEPENDENT_AMBULATORY_CARE_PROVIDER_SITE_OTHER): Payer: Self-pay | Admitting: General Surgery

## 2011-02-10 ENCOUNTER — Ambulatory Visit (INDEPENDENT_AMBULATORY_CARE_PROVIDER_SITE_OTHER): Payer: Medicare Other | Admitting: General Surgery

## 2011-02-10 VITALS — BP 148/78 | HR 80 | Temp 98.0°F | Resp 16 | Ht 67.0 in | Wt 207.6 lb

## 2011-02-10 DIAGNOSIS — Z803 Family history of malignant neoplasm of breast: Secondary | ICD-10-CM

## 2011-02-10 DIAGNOSIS — R921 Mammographic calcification found on diagnostic imaging of breast: Secondary | ICD-10-CM

## 2011-02-10 DIAGNOSIS — R928 Other abnormal and inconclusive findings on diagnostic imaging of breast: Secondary | ICD-10-CM

## 2011-02-10 NOTE — Progress Notes (Signed)
Chief Complaint  Patient presents with  . Other    new pt- eval rt br radial scar to be excised    HPI [name] is a 65 y.o. female.    This patient was referred to me by Dr. Norva Pavlov at the breast center  for evaluating and possible biopsy of an area of calcifications in the right breast at the 3:30 position. This area was recently needle biopsied and showed a radial scar.  Patient is followed by Dr. Trula Slade. She has some significant medical problems including hypertension, COPD, cerebral aneurysms that had been coiled, rheumatoid arthritis, diabetes, obstructive sleep apnea.  Recent mammograms showed an area of calcifications medially in the right breast. Initial, it was thought that these were probably benign but they decided to biopsy this area. The biopsy shows  a radial scar which carries an increased risk for occult carcinoma and she was referred for excision.  Family history is positive for breast cancer in her sister. Diagnosis in her sister at age 7. She had partial mastectomy chemotherapy radiation therapy and antiestrogen therapy. There is no other family history of ovarian or breast cancer. HPI  Past Medical History  Diagnosis Date  . Cerebral aneurysm   . Hypertension   . COPD (chronic obstructive pulmonary disease)   . Atrial fibrillation   . Arthritis   . GERD (gastroesophageal reflux disease)   . Sleep apnea   . Chronic kidney disease   . Anemia   . Asthma   . Emphysema of lung   . Diabetes mellitus   . Aneurysm     x2    Past Surgical History  Procedure Date  . Tubal ligation   . Hand surgery     bilateral    Family History  Problem Relation Age of Onset  . Heart disease Mother   . Cancer Father     lung  . Cancer Sister     breast    Social History History  Substance Use Topics  . Smoking status: Former Games developer  . Smokeless tobacco: Not on file   Comment: quit 2006  . Alcohol Use: Yes    Allergies  Allergen Reactions   . Gold Salts   . Tape Dermatitis    Current Outpatient Prescriptions  Medication Sig Dispense Refill  . albuterol (PROAIR HFA) 108 (90 BASE) MCG/ACT inhaler Inhale 2 puffs into the lungs every 6 (six) hours as needed.        . bisoprolol (ZEBETA) 5 MG tablet Take 5 mg by mouth daily.        . Fluticasone-Salmeterol (ADVAIR) 250-50 MCG/DOSE AEPB Inhale 1 puff into the lungs every 12 (twelve) hours.        . metFORMIN (GLUCOPHAGE) 500 MG tablet Take 500 mg by mouth 2 (two) times daily with a meal.        . omeprazole (PRILOSEC) 20 MG capsule Take 20 mg by mouth daily.        . predniSONE (DELTASONE) 10 MG tablet Take 10 mg by mouth daily.        Marland Kitchen spironolactone-hydrochlorothiazide (ALDACTAZIDE) 25-25 MG per tablet Take 1 tablet by mouth daily.        Marland Kitchen tiotropium (SPIRIVA) 18 MCG inhalation capsule Place 18 mcg into inhaler and inhale daily.        Marland Kitchen zolpidem (AMBIEN) 10 MG tablet Take 10 mg by mouth at bedtime as needed.          Review of Systems  Review of Systems  Constitutional: Negative for fever, chills and unexpected weight change.  HENT: Negative for hearing loss, congestion, sore throat, trouble swallowing and voice change.   Eyes: Negative for visual disturbance.  Respiratory: Positive for wheezing. Negative for cough.   Cardiovascular: Negative for chest pain, palpitations and leg swelling.  Gastrointestinal: Negative for nausea, vomiting, abdominal pain, diarrhea, constipation, blood in stool, abdominal distention and anal bleeding.  Genitourinary: Negative for hematuria, vaginal bleeding and difficulty urinating.  Musculoskeletal: Positive for myalgias. Negative for arthralgias.  Skin: Negative for rash and wound.  Neurological: Negative for seizures, syncope and headaches.  Hematological: Negative for adenopathy. Does not bruise/bleed easily.  Psychiatric/Behavioral: Negative for confusion.    Blood pressure 148/78, pulse 80, temperature 98 F (36.7 C), temperature  source Temporal, resp. rate 16, height 5\' 7"  (1.702 m), weight 207 lb 9.6 oz (94.167 kg).  Physical Exam Physical Exam  Constitutional: She is oriented to person, place, and time. She appears well-developed and well-nourished. No distress.  HENT:  Head: Normocephalic and atraumatic.  Nose: Nose normal.  Mouth/Throat: No oropharyngeal exudate.  Eyes: Conjunctivae and EOM are normal. Pupils are equal, round, and reactive to light. Left eye exhibits no discharge. No scleral icterus.  Neck: Neck supple. No JVD present. No tracheal deviation present. No thyromegaly present.  Cardiovascular: Normal rate, regular rhythm, normal heart sounds and intact distal pulses.   No murmur heard. Pulmonary/Chest: Effort normal and breath sounds normal. No respiratory distress. She has no wheezes. She has no rales. She exhibits no tenderness.    Abdominal: Soft. Bowel sounds are normal. She exhibits no distension and no mass. There is no tenderness. There is no rebound and no guarding.  Musculoskeletal: She exhibits no edema and no tenderness.  Lymphadenopathy:    She has no cervical adenopathy.  Neurological: She is alert and oriented to person, place, and time. She exhibits normal muscle tone. Coordination normal.  Skin: Skin is warm. No rash noted. She is not diaphoretic. No erythema. No pallor.  Psychiatric: She has a normal mood and affect. Her behavior is normal. Judgment and thought content normal.    Data Reviewed Have reviewed the mammograms, mammogram films, and the pathology report. 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 Biopsy is indicated to exclude occult carcinoma  Obstructive sleep apnea.  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    Plan    I had a long discussion with the  patient regarding the needle biopsy histologic diagnosis. She is aware that there is a low but definite risk of occult carcinoma and that excisional  biopsies is indicated this time.  She is going to have this done. We will schedule for right partial mastectomy with needle localization in the near future.  I discussed the indications and details of surgery with her. Risks and consultations have been outlined, including but not limited to bleeding, infection, cosmetic deformity, reoperation for positive margins, chronic nerve damage with pain or numbness, cardiac pulmonary and thromboembolic problems. She seems to understand these issues well. That's all of her questions are answered. She is in full agreement with this plan.       Denzil Mceachron M 02/10/2011, 2:46 PM

## 2011-02-10 NOTE — Patient Instructions (Signed)
You will be scheduled for surgery to remove the area of calcifications in the medial aspect of the right breast.  Breast Biopsy WHY YOU NEED A BIOPSY Your caregiver has recommended that you have a breast tissue sample taken (biopsy). This is done to be certain that the lump or abnormality found in your breast is not cancerous (malignant). During a biopsy, a small piece of tissue is removed, so it can be examined under a microscope by a specialist (pathologist) who looks at tissues and cells and diagnoses abnormalities in them. Most lumps (tumors) or abnormalities, on or in the breast, are not cancerous (benign). However, biopsies are taken when your caregiver cannot be absolutely certain of what is wrong only from doing a physical exam, mammogram (breast X-ray), or other studies. A breast biopsy can tell you whether nothing more needs to be done, or you need more surgery or another type of treatment. A biopsy is done when there is:  Any undiagnosed breast mass.   Nipple abnormalities, dimpling, crusting, or ulcerations.   Calcium deposits (calcifications) or abnormalities seen on your mammogram, ultrasound, or MRI.   Suspicious changes in the breast (thickening, asymmetry) seen on mammogram.   Abnormal discharge from the nipple, especially blood.   Redness, swelling, and pain of the breast.  HOW A BIOPSY IS PERFORMED A biopsy is often performed on an outpatient basis (you go home the same day). This can be done in a hospital, clinic, or surgical center. Tissue samples (biopsies) are often done under local anesthesia (area is numbed). Sometimes general anesthetics are required, in which case you sleep through the procedure. Biopsies may remove the entire lump, a small piece of the lump, or a small sliver of tissue removed by needle. TYPES OF BREAST BIOPSY  Fine needle aspiration. A thin needle is placed through the skin, to the lump or cyst, and cells are removed.   Core needle biopsy. A large  needle with a special tip is placed through the skin, to the abnormality, and a piece of tissue is removed.   Stereotactic biopsy. A core needle with a special X-ray is used, to direct the needle to the lump or abnormal area, which is difficult to feel or cannot be felt.   Vacuum-assisted biopsy. A hollow probe and a gentle vacuum remove a sample of tissue.   Ultrasound guided core needle biopsy. You lie on your stomach, with your breast through an opening, and a high frequency ultrasound helps guide the needle to the area of the abnormality.   Open biopsy. An incision is made in the breast, and a piece of the lump or the whole lump is removed.  LET YOUR CAREGIVER KNOW ABOUT:  Allergies.   Medicines taken, including herbs, eye drops, over-the-counter medicines, and creams.   Use of steroids (by mouth or creams).   Previous problems with anesthetics or Novocaine.   If you are taking aspirin or blood thinners.   Possibility of pregnancy, if this applies.   History of blood clots (thrombophlebitis).   History of bleeding or blood problems.   Previous surgery.   Other health problems.  RISKS AND COMPLICATIONS   Bleeding.   Infection.   Allergy to medicines.   Bruising and swelling of the breast.   Alteration in the shape of the breast.   Not finding the lump or abnormality.   Needing more surgery.  BEFORE THE PROCEDURE  You should arrive 60 minutes prior to your procedure or as directed.   Check-in  at the admissions desk, to fill out necessary forms, if you are not preregistered.   There will be consent forms to sign, prior to the procedure.   There is a waiting area for your family, while you are having your biopsy.   Try to have someone with you, to drive you home.   Do not smoke for 2 weeks before the surgery.   Let your caregiver know if you develop a cold or an infection.   Do not drink alcohol for at least 24 hours before surgery.   Wear a good support  bra to the surgery.  AFTER THE PROCEDURE  After surgery, you will be taken to the recovery area, where a nurse will watch and check your progress. Once you are awake, stable, and taking fluids well, if there are no other problems, you will be allowed to go home.   Ice packs applied to your operative site may help with discomfort and keep the swelling down.   You may resume normal diet and activities as directed. Avoid strenuous activities affecting the arm on the side of the biopsy, such as tennis, swimming, heavy lifting (more than 10 pounds) or pulling.   Bruising in the breast is normal following this procedure.   Wearing a support bra, even to bed, may be more comfortable. The bra will also help keep the dressing on.   Change dressings as directed.   Your doctor may apply a pressure dressing on your breast for 24 to 48 hours.   Only take over-the-counter or prescription medicines for pain, discomfort, or fever as directed by your caregiver.   Do not take aspirin, because it can cause bleeding.  HOME CARE INSTRUCTIONS   You may resume your usual diet.   Have someone drive you home after the surgery.   Do not do any exercise, driving, lifting or general activities without your caregiver's permission.   Take medicines and over-the-counter medicines, as ordered by your caregiver.   Keep your postoperative appointments as recommended.   Do not drink alcohol while taking pain medicine.  Finding out the results of your test Not all test results are available during your visit. If your test results are not back during the visit, make an appointment with your caregiver to find out the results. Do not assume everything is normal if you have not heard from your caregiver or the medical facility. It is important for you to follow up on all of your test results.  SEEK MEDICAL CARE IF:   You notice redness, swelling, or increasing pain in the wound.   You notice a bad smell coming from  the wound or dressing.   You develop a rash.   You need stronger pain medicine.   You are having an allergic reaction or problems with your medicines.  SEEK IMMEDIATE MEDICAL CARE IF:   You have difficulty breathing.   You have a fever.   There is increased bleeding (more than a small spot) from the wound.   Pus is coming from the wound.   The wound is breaking open.  Document Released: 04/07/2005 Document Revised: 12/18/2010 Document Reviewed: 02/23/2009 [facility] Patient Information 2012 [place], [place].

## 2011-02-20 ENCOUNTER — Encounter (HOSPITAL_COMMUNITY)
Admission: RE | Admit: 2011-02-20 | Discharge: 2011-02-20 | Disposition: A | Discharge status: Home / Self Care | Payer: Medicare Other | Source: Ambulatory Visit | Attending: General Surgery | Admitting: General Surgery

## 2011-02-20 ENCOUNTER — Encounter (HOSPITAL_COMMUNITY): Payer: Self-pay

## 2011-02-20 HISTORY — DX: Adverse effect of unspecified anesthetic, initial encounter: T41.45XA

## 2011-02-20 HISTORY — DX: Other specified disorders of adrenal gland: E27.8

## 2011-02-20 HISTORY — DX: Rheumatoid arthritis, unspecified: M06.9

## 2011-02-20 HISTORY — DX: Urinary tract infection, site not specified: N39.0

## 2011-02-20 HISTORY — DX: Bronchitis, not specified as acute or chronic: J40

## 2011-02-20 HISTORY — DX: Major depressive disorder, single episode, unspecified: F32.9

## 2011-02-20 HISTORY — DX: Shortness of breath: R06.02

## 2011-02-20 HISTORY — DX: Other complications of anesthesia, initial encounter: T88.59XA

## 2011-02-20 HISTORY — DX: Cerebral aneurysm, nonruptured: I67.1

## 2011-02-20 HISTORY — DX: Depression, unspecified: F32.A

## 2011-02-20 LAB — COMPREHENSIVE METABOLIC PANEL
AST: 12 U/L (ref 0–37)
Alkaline Phosphatase: 56 U/L (ref 39–117)
BUN: 19 mg/dL (ref 6–23)
CO2: 32 mEq/L (ref 19–32)
Chloride: 93 mEq/L — ABNORMAL LOW (ref 96–112)
Creatinine, Ser: 1.02 mg/dL (ref 0.50–1.10)
GFR calc non Af Amer: 56 mL/min — ABNORMAL LOW (ref >90)
Potassium: 3.6 mEq/L (ref 3.5–5.1)
Total Bilirubin: 0.3 mg/dL (ref 0.3–1.2)

## 2011-02-20 LAB — DIFFERENTIAL
Basophils Absolute: 0 10*3/uL (ref 0.0–0.1)
Basophils Relative: 0 % (ref 0–1)
Eosinophils Absolute: 0.3 10*3/uL (ref 0.0–0.7)
Monocytes Relative: 9 % (ref 3–12)
Neutro Abs: 8.3 10*3/uL — ABNORMAL HIGH (ref 1.7–7.7)
Neutrophils Relative %: 58 % (ref 43–77)

## 2011-02-20 LAB — CBC
MCH: 29.3 pg (ref 26.0–34.0)
Platelets: 389 10*3/uL (ref 150–400)
RBC: 4.34 MIL/uL (ref 3.87–5.11)
WBC: 14.5 10*3/uL — ABNORMAL HIGH (ref 4.0–10.5)

## 2011-02-20 LAB — URINALYSIS, ROUTINE W REFLEX MICROSCOPIC
Ketones, ur: NEGATIVE mg/dL
Leukocytes, UA: NEGATIVE
Nitrite: NEGATIVE
Protein, ur: NEGATIVE mg/dL
pH: 5.5 (ref 5.0–8.0)

## 2011-02-20 LAB — SURGICAL PCR SCREEN: Staphylococcus aureus: POSITIVE — AB

## 2011-02-20 NOTE — Pre-Procedure Instructions (Signed)
20 DEBY ADGER  02/20/2011   Your procedure is scheduled on:  Friday, February 28, 2011  Report to [facility] at 1030 AM.  Call this number if you have problems the morning of surgery: [phone]   Remember:   Do not eat food:After Midnight.  Do not drink clear liquids: 4 Hours before arrival.  Take these medicines the morning of surgery with A SIP OF WATER:Albuterol, Advair, omeprazole, spiriva, zebeta   Do not wear jewelry, make-up or nail polish.  Do not wear lotions, powders, or perfumes. You may wear deodorant.  Do not shave 48 hours prior to surgery.  Do not bring valuables to the hospital.  Contacts, dentures or bridgework may not be worn into surgery.  Leave suitcase in the car. After surgery it may be brought to your room.  For patients admitted to the hospital, checkout time is 11:00 AM the day of discharge.   Patients discharged the day of surgery will not be allowed to drive home.  Name and phone number of your driver: family   Special Instructions: CHG Shower Use Special Wash: 1/2 bottle night before surgery and 1/2 bottle morning of surgery.   Please read over the following fact sheets that you were given: Pain Booklet, Coughing and Deep Breathing, MRSA Information and Surgical Site Infection Prevention

## 2011-02-27 ENCOUNTER — Other Ambulatory Visit (INDEPENDENT_AMBULATORY_CARE_PROVIDER_SITE_OTHER): Payer: Self-pay | Admitting: General Surgery

## 2011-02-27 MED ORDER — DEXTROSE 5 % IV SOLN: 1.0000 g | INTRAVENOUS | Status: DC

## 2011-02-27 NOTE — H&P (Signed)
 [name]   02/10/2011 2:15 PM Office Visit  MRN: [id number]   Description: 65 year old female  Provider: Ayson Cherubini M, MD  Department: [facility]        Diagnoses     Breast calcification, right   - Primary    793.89    Family history of breast cancer     V16.3      Reason for Visit     Other    new pt- eval rt br radial scar to be excised        Vitals - Last Recorded       BP Pulse Temp(Src) Resp Ht Wt    148/78  80  98 F (36.7 C) (Temporal)  16  5' 7" (1.702 m)  207 lb 9.6 oz (94.167 kg)          BMI              32.51 kg/m2                 Progress Notes     Shakayla Hickox M, MD  02/10/2011  2:55 PM  SignedChief Complaint   Patient presents with   .  Other       new pt- eval rt br radial scar to be excised      HPI [name] is a 65 y.o. female.     This patient was referred to me by Dr. Elizabeth Brown at the breast center  for evaluating and possible biopsy of an area of calcifications in the right breast at the 3:30 position. This area was recently needle biopsied and showed a radial scar.   Patient is followed by Dr. Ron Polite. She has some significant medical problems including hypertension, COPD, cerebral aneurysms that had been coiled, rheumatoid arthritis, diabetes, obstructive sleep apnea.   Recent mammograms showed an area of calcifications medially in the right breast. Initial, it was thought that these were probably benign but they decided to biopsy this area. The biopsy shows  a radial scar which carries an increased risk for occult carcinoma and she was referred for excision.   Family history is positive for breast cancer in her sister. Diagnosis in her sister at age 66. She had partial mastectomy chemotherapy radiation therapy and antiestrogen therapy. There is no other family history of ovarian or breast cancer. HPI    Past Medical History   Diagnosis  Date   .  Cerebral aneurysm     .  Hypertension       .  COPD (chronic obstructive pulmonary disease)     .  Atrial fibrillation     .  Arthritis     .  GERD (gastroesophageal reflux disease)     .  Sleep apnea     .  Chronic kidney disease     .  Anemia     .  Asthma     .  Emphysema of lung     .  Diabetes mellitus     .  Aneurysm         x2       Past Surgical History   Procedure  Date   .  Tubal ligation     .  Hand surgery         bilateral       Family History   Problem  Relation  Age of Onset   .  Heart disease  Mother     .    Cancer  Father         lung   .  Cancer  Sister         breast      Social History History   Substance Use Topics   .  Smoking status:  Former Smoker   .  Smokeless tobacco:  Not on file     Comment: quit 2006   .  Alcohol Use:  Yes       Allergies   Allergen  Reactions   .  Gold Salts     .  Tape  Dermatitis       Current Outpatient Prescriptions   Medication  Sig  Dispense  Refill   .  albuterol (PROAIR HFA) 108 (90 BASE) MCG/ACT inhaler  Inhale 2 puffs into the lungs every 6 (six) hours as needed.           .  bisoprolol (ZEBETA) 5 MG tablet  Take 5 mg by mouth daily.           .  Fluticasone-Salmeterol (ADVAIR) 250-50 MCG/DOSE AEPB  Inhale 1 puff into the lungs every 12 (twelve) hours.           .  metFORMIN (GLUCOPHAGE) 500 MG tablet  Take 500 mg by mouth 2 (two) times daily with a meal.           .  omeprazole (PRILOSEC) 20 MG capsule  Take 20 mg by mouth daily.           .  predniSONE (DELTASONE) 10 MG tablet  Take 10 mg by mouth daily.           .  spironolactone-hydrochlorothiazide (ALDACTAZIDE) 25-25 MG per tablet  Take 1 tablet by mouth daily.           .  tiotropium (SPIRIVA) 18 MCG inhalation capsule  Place 18 mcg into inhaler and inhale daily.           .  zolpidem (AMBIEN) 10 MG tablet  Take 10 mg by mouth at bedtime as needed.              Review of Systems Review of Systems  Constitutional: Negative for fever, chills and unexpected weight change.  HENT:  Negative for hearing loss, congestion, sore throat, trouble swallowing and voice change.   Eyes: Negative for visual disturbance.  Respiratory: Positive for wheezing. Negative for cough.   Cardiovascular: Negative for chest pain, palpitations and leg swelling.  Gastrointestinal: Negative for nausea, vomiting, abdominal pain, diarrhea, constipation, blood in stool, abdominal distention and anal bleeding.  Genitourinary: Negative for hematuria, vaginal bleeding and difficulty urinating.  Musculoskeletal: Positive for myalgias. Negative for arthralgias.  Skin: Negative for rash and wound.  Neurological: Negative for seizures, syncope and headaches.  Hematological: Negative for adenopathy. Does not bruise/bleed easily.  Psychiatric/Behavioral: Negative for confusion.    Blood pressure 148/78, pulse 80, temperature 98 F (36.7 C), temperature source Temporal, resp. rate 16, height 5' 7" (1.702 m), weight 207 lb 9.6 oz (94.167 kg).   Physical Exam Physical Exam  Constitutional: She is oriented to person, place, and time. She appears well-developed and well-nourished. No distress.  HENT:   Head: Normocephalic and atraumatic.   Nose: Nose normal.   Mouth/Throat: No oropharyngeal exudate.  Eyes: Conjunctivae and EOM are normal. Pupils are equal, round, and reactive to light. Left eye exhibits no discharge. No scleral icterus.  Neck: Neck supple. No JVD present. No tracheal deviation present. No thyromegaly present.    Cardiovascular: Normal rate, regular rhythm, normal heart sounds and intact distal pulses.    No murmur heard. Pulmonary/Chest: Effort normal and breath sounds normal. No respiratory distress. She has no wheezes. She has no rales. She exhibits no tenderness.    Abdominal: Soft. Bowel sounds are normal. She exhibits no distension and no mass. There is no tenderness. There is no rebound and no guarding.  Musculoskeletal: She exhibits no edema and no tenderness.  Lymphadenopathy:     She has no cervical adenopathy.  Neurological: She is alert and oriented to person, place, and time. She exhibits normal muscle tone. Coordination normal.  Skin: Skin is warm. No rash noted. She is not diaphoretic. No erythema. No pallor.  Psychiatric: She has a normal mood and affect. Her behavior is normal. Judgment and thought content normal.    Data Reviewed Have reviewed the mammograms, mammogram films, and the pathology report. 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 Biopsy is indicated to exclude occult carcinoma   Obstructive sleep apnea.   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   Plan I had a long discussion with the  patient regarding the needle biopsy histologic diagnosis. She is aware that there is a low but definite risk of occult carcinoma and that excisional  biopsies is indicated this time.   She is going to have this done. We will schedule for right partial mastectomy with needle localization in the near future.   I discussed the indications and details of surgery with her. Risks and consultations have been outlined, including but not limited to bleeding, infection, cosmetic deformity, reoperation for positive margins, chronic nerve damage with pain or numbness, cardiac pulmonary and thromboembolic problems. She seems to understand these issues well. That's all of her questions are answered. She is in full agreement with this plan.       Hewitt Garner M 02/10/2011, 2:46 PM                Not recorded             Patient Instructions     You will be scheduled for surgery to remove the area of calcifications in the  medial aspect of the right breast.   Breast Biopsy WHY YOU NEED A BIOPSY  Your caregiver has recommended that you have a breast tissue sample taken (biopsy). This is done to be certain that the lump or abnormality found in your breast is not cancerous (malignant). During a biopsy, a small piece of tissue is removed, so it can be examined under a microscope by a specialist (pathologist) who looks at tissues and cells and diagnoses abnormalities in them. Most lumps (tumors) or abnormalities, on or in the breast, are not cancerous (benign). However, biopsies are taken when your caregiver cannot be absolutely certain of what is wrong only from doing a physical exam, mammogram (breast X-ray), or other studies. A breast biopsy can tell you whether nothing more needs to be done, or you need more surgery or another type of treatment. A biopsy is done when there is: Any undiagnosed breast mass.   Nipple abnormalities, dimpling, crusting, or ulcerations.   Calcium deposits (calcifications) or abnormalities seen on your mammogram, ultrasound, or MRI.     Suspicious changes in the breast (thickening, asymmetry) seen on mammogram.   Abnormal discharge from the nipple, especially blood.   Redness, swelling, and pain of the breast.  HOW A BIOPSY IS PERFORMED A biopsy is often performed on an outpatient basis (you go home the same day). This can be done in a hospital, clinic, or surgical center. Tissue samples (biopsies) are often done under local anesthesia (area is numbed). Sometimes general anesthetics are required, in which case you sleep through the procedure. Biopsies may remove the entire lump, a small piece of the lump, or a small sliver of tissue removed by needle. TYPES OF BREAST BIOPSY Fine needle aspiration. A thin needle is placed through the skin, to the lump or cyst, and cells are removed.   Core needle biopsy. A large needle with a special tip is placed through the skin, to the abnormality, and a  piece of tissue is removed.   Stereotactic biopsy. A core needle with a special X-ray is used, to direct the needle to the lump or abnormal area, which is difficult to feel or cannot be felt.   Vacuum-assisted biopsy. A hollow probe and a gentle vacuum remove a sample of tissue.   Ultrasound guided core needle biopsy. You lie on your stomach, with your breast through an opening, and a high frequency ultrasound helps guide the needle to the area of the abnormality.   Open biopsy. An incision is made in the breast, and a piece of the lump or the whole lump is removed.  LET YOUR CAREGIVER KNOW ABOUT: Allergies.   Medicines taken, including herbs, eye drops, over-the-counter medicines, and creams.   Use of steroids (by mouth or creams).   Previous problems with anesthetics or Novocaine.   If you are taking aspirin or blood thinners.   Possibility of pregnancy, if this applies.   History of blood clots (thrombophlebitis).   History of bleeding or blood problems.   Previous surgery.   Other health problems.  RISKS AND COMPLICATIONS   Bleeding.   Infection.   Allergy to medicines.   Bruising and swelling of the breast.   Alteration in the shape of the breast.   Not finding the lump or abnormality.   Needing more surgery.  BEFORE THE PROCEDURE You should arrive 60 minutes prior to your procedure or as directed.   Check-in at the admissions desk, to fill out necessary forms, if you are not preregistered.   There will be consent forms to sign, prior to the procedure.   There is a waiting area for your family, while you are having your biopsy.   Try to have someone with you, to drive you home.   Do not smoke for 2 weeks before the surgery.   Let your caregiver know if you develop a cold or an infection.   Do not drink alcohol for at least 24 hours before surgery.   Wear a good support bra to the surgery.  AFTER THE PROCEDURE After surgery, you will be taken to the recovery area, where a nurse  will watch and check your progress. Once you are awake, stable, and taking fluids well, if there are no other problems, you will be allowed to go home.   Ice packs applied to your operative site may help with discomfort and keep the swelling down.   You may resume normal diet and activities as directed. Avoid strenuous activities affecting the arm on the side of the biopsy, such as tennis, swimming, heavy lifting (  more than 10 pounds) or pulling.   Bruising in the breast is normal following this procedure.   Wearing a support bra, even to bed, may be more comfortable. The bra will also help keep the dressing on.   Change dressings as directed.   Your doctor may apply a pressure dressing on your breast for 24 to 48 hours.   Only take over-the-counter or prescription medicines for pain, discomfort, or fever as directed by your caregiver.   Do not take aspirin, because it can cause bleeding.  HOME CARE INSTRUCTIONS   You may resume your usual diet.   Have someone drive you home after the surgery.   Do not do any exercise, driving, lifting or general activities without your caregiver's permission.   Take medicines and over-the-counter medicines, as ordered by your caregiver.   Keep your postoperative appointments as recommended.   Do not drink alcohol while taking pain medicine.  Finding out the results of your test Not all test results are available during your visit. If your test results are not back during the visit, make an appointment with your caregiver to find out the results. Do not assume everything is normal if you have not heard from your caregiver or the medical facility. It is important for you to follow up on all of your test results.   SEEK MEDICAL CARE IF:   You notice redness, swelling, or increasing pain in the wound.   You notice a bad smell coming from the wound or dressing.   You develop a rash.   You need stronger pain medicine.   You are having an allergic reaction or  problems with your medicines.  SEEK IMMEDIATE MEDICAL CARE IF:   You have difficulty breathing.   You have a fever.   There is increased bleeding (more than a small spot) from the wound.   Pus is coming from the wound.   The wound is breaking open.  Document Released: 04/07/2005 Document Revised: 12/18/2010 Document Reviewed: 02/23/2009 [employer] Patient Information 2012 [employer]       Level of Service     PR OFFICE/OUTPT VISIT,NEW,LEVL III [99203]         All Flowsheet Templates (all recorded)     Encounter Vitals Flowsheet    Custom Formula Data Flowsheet    Anthropometrics Flowsheet               Referring Provider          Elizabeth D Brown       All Charges for This Encounter       Code Description Service Date Service Provider Modifiers Quantity    99203 PR OFFICE/OUTPT VISIT,NEW,LEVL III 02/10/2011 Kayton Ripp M Zafiro Routson, MD   1    G8447 PR PT VIS DOC USE EHR CER ATCB 02/10/2011 Javontae Marlette M Kennet Mccort, MD   1    1036F PR CURRENT TOBACCO NON-USER 02/10/2011 Jayvan Mcshan M Ranette Luckadoo, MD   1        Other Encounter Related Information     Allergies & Medications         Problem List         History         Patient-Entered Questionnaires     No data filed         

## 2011-02-28 ENCOUNTER — Ambulatory Visit (HOSPITAL_COMMUNITY): Payer: Medicare Other | Admitting: Anesthesiology

## 2011-02-28 ENCOUNTER — Other Ambulatory Visit (INDEPENDENT_AMBULATORY_CARE_PROVIDER_SITE_OTHER): Payer: Self-pay | Admitting: General Surgery

## 2011-02-28 ENCOUNTER — Ambulatory Visit
Admission: RE | Admit: 2011-02-28 | Discharge: 2011-02-28 | Disposition: A | Discharge status: Home / Self Care | Payer: Medicare Other | Source: Ambulatory Visit | Attending: General Surgery | Admitting: General Surgery

## 2011-02-28 ENCOUNTER — Encounter (HOSPITAL_COMMUNITY): Payer: Self-pay | Admitting: Anesthesiology

## 2011-02-28 ENCOUNTER — Ambulatory Visit (HOSPITAL_COMMUNITY)
Admission: RE | Admit: 2011-02-28 | Discharge: 2011-02-28 | Disposition: A | Discharge status: Home / Self Care | Payer: Medicare Other | Source: Ambulatory Visit | Attending: General Surgery | Admitting: General Surgery

## 2011-02-28 ENCOUNTER — Encounter (HOSPITAL_COMMUNITY)
Admission: RE | Disposition: A | Discharge status: Home / Self Care | Payer: Self-pay | Source: Ambulatory Visit | Attending: General Surgery

## 2011-02-28 DIAGNOSIS — F3289 Other specified depressive episodes: Secondary | ICD-10-CM | POA: Insufficient documentation

## 2011-02-28 DIAGNOSIS — G4733 Obstructive sleep apnea (adult) (pediatric): Secondary | ICD-10-CM | POA: Insufficient documentation

## 2011-02-28 DIAGNOSIS — Z803 Family history of malignant neoplasm of breast: Secondary | ICD-10-CM | POA: Insufficient documentation

## 2011-02-28 DIAGNOSIS — Z01818 Encounter for other preprocedural examination: Secondary | ICD-10-CM | POA: Insufficient documentation

## 2011-02-28 DIAGNOSIS — E669 Obesity, unspecified: Secondary | ICD-10-CM | POA: Insufficient documentation

## 2011-02-28 DIAGNOSIS — Z87891 Personal history of nicotine dependence: Secondary | ICD-10-CM | POA: Insufficient documentation

## 2011-02-28 DIAGNOSIS — F329 Major depressive disorder, single episode, unspecified: Secondary | ICD-10-CM | POA: Insufficient documentation

## 2011-02-28 DIAGNOSIS — R920 Mammographic microcalcification found on diagnostic imaging of breast: Secondary | ICD-10-CM

## 2011-02-28 DIAGNOSIS — L905 Scar conditions and fibrosis of skin: Secondary | ICD-10-CM | POA: Insufficient documentation

## 2011-02-28 DIAGNOSIS — I4891 Unspecified atrial fibrillation: Secondary | ICD-10-CM | POA: Insufficient documentation

## 2011-02-28 DIAGNOSIS — N6029 Fibroadenosis of unspecified breast: Secondary | ICD-10-CM | POA: Insufficient documentation

## 2011-02-28 DIAGNOSIS — M069 Rheumatoid arthritis, unspecified: Secondary | ICD-10-CM | POA: Insufficient documentation

## 2011-02-28 DIAGNOSIS — N6019 Diffuse cystic mastopathy of unspecified breast: Secondary | ICD-10-CM

## 2011-02-28 DIAGNOSIS — E119 Type 2 diabetes mellitus without complications: Secondary | ICD-10-CM | POA: Insufficient documentation

## 2011-02-28 DIAGNOSIS — R921 Mammographic calcification found on diagnostic imaging of breast: Secondary | ICD-10-CM | POA: Diagnosis present

## 2011-02-28 DIAGNOSIS — Z01810 Encounter for preprocedural cardiovascular examination: Secondary | ICD-10-CM | POA: Insufficient documentation

## 2011-02-28 DIAGNOSIS — I129 Hypertensive chronic kidney disease with stage 1 through stage 4 chronic kidney disease, or unspecified chronic kidney disease: Secondary | ICD-10-CM | POA: Insufficient documentation

## 2011-02-28 DIAGNOSIS — F411 Generalized anxiety disorder: Secondary | ICD-10-CM | POA: Insufficient documentation

## 2011-02-28 DIAGNOSIS — K219 Gastro-esophageal reflux disease without esophagitis: Secondary | ICD-10-CM | POA: Insufficient documentation

## 2011-02-28 DIAGNOSIS — Z01812 Encounter for preprocedural laboratory examination: Secondary | ICD-10-CM | POA: Insufficient documentation

## 2011-02-28 DIAGNOSIS — J449 Chronic obstructive pulmonary disease, unspecified: Secondary | ICD-10-CM | POA: Insufficient documentation

## 2011-02-28 DIAGNOSIS — J4489 Other specified chronic obstructive pulmonary disease: Secondary | ICD-10-CM | POA: Insufficient documentation

## 2011-02-28 DIAGNOSIS — N189 Chronic kidney disease, unspecified: Secondary | ICD-10-CM | POA: Insufficient documentation

## 2011-02-28 HISTORY — PX: BREAST SURGERY: SHX581

## 2011-02-28 HISTORY — PX: MASTECTOMY, PARTIAL: SHX709

## 2011-02-28 SURGERY — MASTECTOMY PARTIAL
Anesthesia: General | Site: Breast | Laterality: Right | Wound class: Clean

## 2011-02-28 MED ORDER — MIDAZOLAM HCL 5 MG/5ML IJ SOLN
INTRAMUSCULAR | Status: DC | PRN
  Administered 2011-02-28: 2 mg via INTRAVENOUS

## 2011-02-28 MED ORDER — PROMETHAZINE HCL 25 MG/ML IJ SOLN: 6.2500 mg | INTRAMUSCULAR | Status: DC | PRN

## 2011-02-28 MED ORDER — LACTATED RINGERS IV SOLN
INTRAVENOUS | Status: DC | PRN
  Administered 2011-02-28: 12:00:00 via INTRAVENOUS

## 2011-02-28 MED ORDER — BUPIVACAINE-EPINEPHRINE 0.5% -1:200000 IJ SOLN
INTRAMUSCULAR | Status: DC | PRN
  Administered 2011-02-28: 10 mL

## 2011-02-28 MED ORDER — LIDOCAINE HCL (CARDIAC) 20 MG/ML IV SOLN
INTRAVENOUS | Status: DC | PRN
  Administered 2011-02-28: 100 mg via INTRAVENOUS

## 2011-02-28 MED ORDER — FENTANYL CITRATE 0.05 MG/ML IJ SOLN
INTRAMUSCULAR | Status: DC | PRN
  Administered 2011-02-28: 150 ug via INTRAVENOUS
  Administered 2011-02-28: 25 ug via INTRAVENOUS
  Administered 2011-02-28: 50 ug via INTRAVENOUS
  Administered 2011-02-28: 25 ug via INTRAVENOUS

## 2011-02-28 MED ORDER — CEFAZOLIN SODIUM 1-5 GM-% IV SOLN
INTRAVENOUS | Status: DC | PRN
  Administered 2011-02-28: 2 g via INTRAVENOUS

## 2011-02-28 MED ORDER — HYDROCODONE-ACETAMINOPHEN 5-325 MG PO TABS: 1.0000 | ORAL_TABLET | ORAL | Status: AC | PRN

## 2011-02-28 MED ORDER — ONDANSETRON HCL 4 MG/2ML IJ SOLN
INTRAMUSCULAR | Status: DC | PRN
  Administered 2011-02-28: 4 mg via INTRAVENOUS

## 2011-02-28 MED ORDER — SODIUM CHLORIDE 0.9 % IR SOLN
Status: DC | PRN
  Administered 2011-02-28: 1000 mL

## 2011-02-28 MED ORDER — EPHEDRINE SULFATE 50 MG/ML IJ SOLN
INTRAMUSCULAR | Status: DC | PRN
  Administered 2011-02-28 (×2): 10 mg via INTRAVENOUS

## 2011-02-28 MED ORDER — PROPOFOL 10 MG/ML IV EMUL
INTRAVENOUS | Status: DC | PRN
  Administered 2011-02-28: 180 mg via INTRAVENOUS

## 2011-02-28 MED ORDER — CEFAZOLIN SODIUM-DEXTROSE 2-3 GM-% IV SOLR: 2.0000 g | Freq: Once | INTRAVENOUS | Status: DC

## 2011-02-28 MED ORDER — MEPERIDINE HCL 25 MG/ML IJ SOLN: 6.2500 mg | INTRAMUSCULAR | Status: DC | PRN

## 2011-02-28 MED ORDER — OXYCODONE-ACETAMINOPHEN 5-325 MG PO TABS
1.0000 | ORAL_TABLET | Freq: Once | ORAL | Status: AC
  Administered 2011-02-28: 1 via ORAL

## 2011-02-28 MED ORDER — HYDROMORPHONE HCL PF 1 MG/ML IJ SOLN
0.2500 mg | INTRAMUSCULAR | Status: DC | PRN
  Administered 2011-02-28: 0.25 mg via INTRAVENOUS

## 2011-02-28 MED ORDER — GLYCOPYRROLATE 0.2 MG/ML IJ SOLN
INTRAMUSCULAR | Status: DC | PRN
  Administered 2011-02-28: 0.1 mg via INTRAVENOUS

## 2011-02-28 SURGICAL SUPPLY — 40 items
BLADE SURG 10 STRL SS (BLADE) ×2 IMPLANT
BLADE SURG 15 STRL LF DISP TIS (BLADE) ×1 IMPLANT
BLADE SURG 15 STRL SS (BLADE) ×1
CANISTER SUCTION 2500CC (MISCELLANEOUS) ×2 IMPLANT
CHLORAPREP W/TINT 26ML (MISCELLANEOUS) ×2 IMPLANT
CLOTH BEACON ORANGE TIMEOUT ST (SAFETY) ×2 IMPLANT
COVER SURGICAL LIGHT HANDLE (MISCELLANEOUS) ×2 IMPLANT
DERMABOND ADHESIVE PROPEN (GAUZE/BANDAGES/DRESSINGS) ×1
DERMABOND ADVANCED .7 DNX6 (GAUZE/BANDAGES/DRESSINGS) ×1 IMPLANT
DEVICE DUBIN SPECIMEN MAMMOGRA (MISCELLANEOUS) ×2 IMPLANT
DRAPE LAPAROTOMY TRNSV 102X78 (DRAPE) ×2 IMPLANT
ELECT CAUTERY BLADE 6.4 (BLADE) ×2 IMPLANT
ELECT REM PT RETURN 9FT ADLT (ELECTROSURGICAL) ×2
ELECTRODE REM PT RTRN 9FT ADLT (ELECTROSURGICAL) ×1 IMPLANT
GLOVE ECLIPSE 6.5 STRL STRAW (GLOVE) ×2 IMPLANT
GLOVE EUDERMIC 7 POWDERFREE (GLOVE) ×4 IMPLANT
GLOVE SURG SS PI 7.0 STRL IVOR (GLOVE) ×2 IMPLANT
GLOVE SURG SS PI 7.5 STRL IVOR (GLOVE) ×2 IMPLANT
GOWN STRL NON-REIN LRG LVL3 (GOWN DISPOSABLE) ×10 IMPLANT
KIT BASIN OR (CUSTOM PROCEDURE TRAY) ×2 IMPLANT
KIT MARKER MARGIN INK (KITS) ×2 IMPLANT
KIT ROOM TURNOVER OR (KITS) ×2 IMPLANT
NEEDLE HYPO 25GX1X1/2 BEV (NEEDLE) ×2 IMPLANT
NS IRRIG 1000ML POUR BTL (IV SOLUTION) ×2 IMPLANT
PACK SURGICAL SETUP 50X90 (CUSTOM PROCEDURE TRAY) ×2 IMPLANT
PAD ARMBOARD 7.5X6 YLW CONV (MISCELLANEOUS) ×2 IMPLANT
PENCIL BUTTON HOLSTER BLD 10FT (ELECTRODE) ×2 IMPLANT
SPONGE LAP 4X18 X RAY DECT (DISPOSABLE) ×2 IMPLANT
STAPLER VISISTAT 35W (STAPLE) ×2 IMPLANT
SUT MNCRL AB 4-0 PS2 18 (SUTURE) ×2 IMPLANT
SUT SILK 2 0 SH (SUTURE) ×2 IMPLANT
SUT VIC AB 3-0 SH 18 (SUTURE) ×2 IMPLANT
SYR BULB 3OZ (MISCELLANEOUS) ×2 IMPLANT
SYR CONTROL 10ML LL (SYRINGE) ×2 IMPLANT
TOWEL OR 17X24 6PK STRL BLUE (TOWEL DISPOSABLE) ×2 IMPLANT
TOWEL OR 17X26 10 PK STRL BLUE (TOWEL DISPOSABLE) ×2 IMPLANT
TOWEL OR NON WOVEN STRL DISP B (DISPOSABLE) ×2 IMPLANT
TUBE CONNECTING 12X1/4 (SUCTIONS) ×2 IMPLANT
WATER STERILE IRR 1000ML POUR (IV SOLUTION) IMPLANT
YANKAUER SUCT BULB TIP NO VENT (SUCTIONS) ×2 IMPLANT

## 2011-02-28 NOTE — Anesthesia Preprocedure Evaluation (Addendum)
Anesthesia Evaluation  Patient identified by MRN, date of birth, ID band Patient awake    Reviewed: Allergy & Precautions, H&P , NPO status , Patient's Chart, lab work & pertinent test results  Airway Mallampati: II TM Distance: >3 FB Neck ROM: Full    Dental No notable dental hx. (+) Dental Advisory Given and Teeth Intact   Pulmonary shortness of breath, with exertion and lying, asthma , sleep apnea , COPD COPD inhaler, former smoker clear to auscultation  Pulmonary exam normal + decreased breath sounds      Cardiovascular hypertension, Pt. on home beta blockers + dysrhythmias Atrial Fibrillation regular Normal    Neuro/Psych PSYCHIATRIC DISORDERS Anxiety Depression Has Cerebral Aneurysms x2 which have been coiled with IR    GI/Hepatic Neg liver ROS, GERD-  Medicated and Controlled,  Endo/Other  Diabetes mellitus-, Oral Hypoglycemic Agents  Renal/GU   Genitourinary negative   Musculoskeletal  (+) Arthritis -, Rheumatoid disorders,    Abdominal   Peds negative pediatric ROS (+)  Hematology negative hematology ROS (+)   Anesthesia Other Findings   Reproductive/Obstetrics                         Anesthesia Physical Anesthesia Plan  ASA: III  Anesthesia Plan: General   Post-op Pain Management:    Induction: Intravenous  Airway Management Planned: LMA  Additional Equipment:   Intra-op Plan:   Post-operative Plan: Extubation in OR  Informed Consent: I have reviewed the patients History and Physical, chart, labs and discussed the procedure including the risks, benefits and alternatives for the proposed anesthesia with the patient or authorized representative who has indicated his/her understanding and acceptance.   Dental Advisory Given  Plan Discussed with: Anesthesiologist, Surgeon and CRNA  Anesthesia Plan Comments:        Anesthesia Quick Evaluation

## 2011-02-28 NOTE — Progress Notes (Signed)
1446:  Patient meeting discharge criteria from PACU and ready to transfer to post op ssc for discharge instructions to home; taken off monitor; pt up to bathroom, tolerating po fluids; sitting up in wheelchair.

## 2011-02-28 NOTE — Progress Notes (Signed)
Discharge instructions given to sister and patient; verbalized understanding; instructions signed and copy sent home with prescription with patient/sister.

## 2011-02-28 NOTE — Preoperative (Signed)
Beta Blockers   Reason not to administer Beta Blockers:Not Applicable 

## 2011-02-28 NOTE — Anesthesia Postprocedure Evaluation (Signed)
  Anesthesia Post-op Note  Patient: [name]  Procedure(s) Performed:  MASTECTOMY PARTIAL - RIGHT PARTIAL MASTECTOMY  WITH NEEDLE LOCALIZATION  Patient Location: PACU  Anesthesia Type: General  Level of Consciousness: awake, alert  and oriented  Airway and Oxygen Therapy: Patient Spontanous Breathing  Post-op Pain: none  Post-op Assessment: Post-op Vital signs reviewed, Patient's Cardiovascular Status Stable, Respiratory Function Stable, Patent Airway, No signs of Nausea or vomiting, Adequate PO intake and Pain level controlled  Post-op Vital Signs: Reviewed and stable  Complications: No apparent anesthesia complications

## 2011-02-28 NOTE — Anesthesia Procedure Notes (Signed)
Procedure Name: LMA Insertion Date/Time: 02/28/2011 12:42 PM Performed by: Elon Alas Pre-anesthesia Checklist: Patient identified, Timeout performed, Emergency Drugs available, Suction available and Patient being monitored Patient Re-evaluated:Patient Re-evaluated prior to inductionOxygen Delivery Method: Circle System Utilized Preoxygenation: Pre-oxygenation with 100% oxygen Intubation Type: IV induction LMA: LMA inserted LMA Size: 4.0 Placement Confirmation: positive ETCO2 and breath sounds checked- equal and bilateral Tube secured with: Tape Dental Injury: Teeth and Oropharynx as per pre-operative assessment

## 2011-02-28 NOTE — Transfer of Care (Signed)
Immediate Anesthesia Transfer of Care Note  Patient: [name]  Procedure(s) Performed:  MASTECTOMY PARTIAL - RIGHT PARTIAL MASTECTOMY  WITH NEEDLE LOCALIZATION  Patient Location: PACU  Anesthesia Type: General  Level of Consciousness: awake, alert  and oriented  Airway & Oxygen Therapy: Patient Spontanous Breathing and Patient connected to nasal cannula oxygen  Post-op Assessment: Report given to PACU RN, Post -op Vital signs reviewed and stable and Patient moving all extremities X 4  Post vital signs: Reviewed and stable  Complications: No apparent anesthesia complications

## 2011-02-28 NOTE — Interval H&P Note (Signed)
History and Physical Interval Note:   02/28/2011   11:48 AM   [name]  has presented today for surgery, with the diagnosis of RIGHT BREAST CALCIFICATIONS  The various methods of treatment have been discussed with the patient and family. After consideration of risks, benefits and other options for treatment, the patient has consented to  Procedure(s): MASTECTOMY PARTIAL as a surgical intervention .  The patients' history has been reviewed, patient examined, no change in status, stable for surgery.  I have reviewed the patients' chart and labs.  Questions were answered to the patient's satisfaction.     Ernestene Mention  MD

## 2011-02-28 NOTE — H&P (View-Only) (Signed)
[name]   02/10/2011 2:15 PM Office Visit  MRN: [id number]   Description: 65 year old female  Provider: Ernestene Mention, MD  Department: [facility]        Diagnoses     Breast calcification, right   - Primary    793.89    Family history of breast cancer     V16.3      Reason for Visit     Other    new pt- eval rt br radial scar to be excised        Vitals - Last Recorded       BP Pulse Temp(Src) Resp Ht Wt    148/78  80  98 F (36.7 C) (Temporal)  16  5\' 7"  (1.702 m)  207 lb 9.6 oz (94.167 kg)          BMI              32.51 kg/m2                 Progress Notes     Ernestene Mention, MD  02/10/2011  2:55 PM  SignedChief Complaint   Patient presents with   .  Other       new pt- eval rt br radial scar to be excised      HPI [name] is a 65 y.o. female.     This patient was referred to me by Dr. Norva Pavlov at the breast center  for evaluating and possible biopsy of an area of calcifications in the right breast at the 3:30 position. This area was recently needle biopsied and showed a radial scar.   Patient is followed by Dr. Trula Slade. She has some significant medical problems including hypertension, COPD, cerebral aneurysms that had been coiled, rheumatoid arthritis, diabetes, obstructive sleep apnea.   Recent mammograms showed an area of calcifications medially in the right breast. Initial, it was thought that these were probably benign but they decided to biopsy this area. The biopsy shows  a radial scar which carries an increased risk for occult carcinoma and she was referred for excision.   Family history is positive for breast cancer in her sister. Diagnosis in her sister at age 17. She had partial mastectomy chemotherapy radiation therapy and antiestrogen therapy. There is no other family history of ovarian or breast cancer. HPI    Past Medical History   Diagnosis  Date   .  Cerebral aneurysm     .  Hypertension       .  COPD (chronic obstructive pulmonary disease)     .  Atrial fibrillation     .  Arthritis     .  GERD (gastroesophageal reflux disease)     .  Sleep apnea     .  Chronic kidney disease     .  Anemia     .  Asthma     .  Emphysema of lung     .  Diabetes mellitus     .  Aneurysm         x2       Past Surgical History   Procedure  Date   .  Tubal ligation     .  Hand surgery         bilateral       Family History   Problem  Relation  Age of Onset   .  Heart disease  Mother     .  Cancer  Father         lung   .  Cancer  Sister         breast      Social History History   Substance Use Topics   .  Smoking status:  Former Games developer   .  Smokeless tobacco:  Not on file     Comment: quit 2006   .  Alcohol Use:  Yes       Allergies   Allergen  Reactions   .  Gold Salts     .  Tape  Dermatitis       Current Outpatient Prescriptions   Medication  Sig  Dispense  Refill   .  albuterol (PROAIR HFA) 108 (90 BASE) MCG/ACT inhaler  Inhale 2 puffs into the lungs every 6 (six) hours as needed.           .  bisoprolol (ZEBETA) 5 MG tablet  Take 5 mg by mouth daily.           .  Fluticasone-Salmeterol (ADVAIR) 250-50 MCG/DOSE AEPB  Inhale 1 puff into the lungs every 12 (twelve) hours.           .  metFORMIN (GLUCOPHAGE) 500 MG tablet  Take 500 mg by mouth 2 (two) times daily with a meal.           .  omeprazole (PRILOSEC) 20 MG capsule  Take 20 mg by mouth daily.           .  predniSONE (DELTASONE) 10 MG tablet  Take 10 mg by mouth daily.           Marland Kitchen  spironolactone-hydrochlorothiazide (ALDACTAZIDE) 25-25 MG per tablet  Take 1 tablet by mouth daily.           Marland Kitchen  tiotropium (SPIRIVA) 18 MCG inhalation capsule  Place 18 mcg into inhaler and inhale daily.           Marland Kitchen  zolpidem (AMBIEN) 10 MG tablet  Take 10 mg by mouth at bedtime as needed.              Review of Systems Review of Systems  Constitutional: Negative for fever, chills and unexpected weight change.  HENT:  Negative for hearing loss, congestion, sore throat, trouble swallowing and voice change.   Eyes: Negative for visual disturbance.  Respiratory: Positive for wheezing. Negative for cough.   Cardiovascular: Negative for chest pain, palpitations and leg swelling.  Gastrointestinal: Negative for nausea, vomiting, abdominal pain, diarrhea, constipation, blood in stool, abdominal distention and anal bleeding.  Genitourinary: Negative for hematuria, vaginal bleeding and difficulty urinating.  Musculoskeletal: Positive for myalgias. Negative for arthralgias.  Skin: Negative for rash and wound.  Neurological: Negative for seizures, syncope and headaches.  Hematological: Negative for adenopathy. Does not bruise/bleed easily.  Psychiatric/Behavioral: Negative for confusion.    Blood pressure 148/78, pulse 80, temperature 98 F (36.7 C), temperature source Temporal, resp. rate 16, height 5\' 7"  (1.702 m), weight 207 lb 9.6 oz (94.167 kg).   Physical Exam Physical Exam  Constitutional: She is oriented to person, place, and time. She appears well-developed and well-nourished. No distress.  HENT:   Head: Normocephalic and atraumatic.   Nose: Nose normal.   Mouth/Throat: No oropharyngeal exudate.  Eyes: Conjunctivae and EOM are normal. Pupils are equal, round, and reactive to light. Left eye exhibits no discharge. No scleral icterus.  Neck: Neck supple. No JVD present. No tracheal deviation present. No thyromegaly present.  Cardiovascular: Normal rate, regular rhythm, normal heart sounds and intact distal pulses.    No murmur heard. Pulmonary/Chest: Effort normal and breath sounds normal. No respiratory distress. She has no wheezes. She has no rales. She exhibits no tenderness.    Abdominal: Soft. Bowel sounds are normal. She exhibits no distension and no mass. There is no tenderness. There is no rebound and no guarding.  Musculoskeletal: She exhibits no edema and no tenderness.  Lymphadenopathy:     She has no cervical adenopathy.  Neurological: She is alert and oriented to person, place, and time. She exhibits normal muscle tone. Coordination normal.  Skin: Skin is warm. No rash noted. She is not diaphoretic. No erythema. No pallor.  Psychiatric: She has a normal mood and affect. Her behavior is normal. Judgment and thought content normal.    Data Reviewed Have reviewed the mammograms, mammogram films, and the pathology report. 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 Biopsy is indicated to exclude occult carcinoma   Obstructive sleep apnea.   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   Plan I had a long discussion with the  patient regarding the needle biopsy histologic diagnosis. She is aware that there is a low but definite risk of occult carcinoma and that excisional  biopsies is indicated this time.   She is going to have this done. We will schedule for right partial mastectomy with needle localization in the near future.   I discussed the indications and details of surgery with her. Risks and consultations have been outlined, including but not limited to bleeding, infection, cosmetic deformity, reoperation for positive margins, chronic nerve damage with pain or numbness, cardiac pulmonary and thromboembolic problems. She seems to understand these issues well. That's all of her questions are answered. She is in full agreement with this plan.       Ernestene Mention 02/10/2011, 2:46 PM                Not recorded             Patient Instructions     You will be scheduled for surgery to remove the area of calcifications in the  medial aspect of the right breast.   Breast Biopsy WHY YOU NEED A BIOPSY  Your caregiver has recommended that you have a breast tissue sample taken (biopsy). This is done to be certain that the lump or abnormality found in your breast is not cancerous (malignant). During a biopsy, a small piece of tissue is removed, so it can be examined under a microscope by a specialist (pathologist) who looks at tissues and cells and diagnoses abnormalities in them. Most lumps (tumors) or abnormalities, on or in the breast, are not cancerous (benign). However, biopsies are taken when your caregiver cannot be absolutely certain of what is wrong only from doing a physical exam, mammogram (breast X-ray), or other studies. A breast biopsy can tell you whether nothing more needs to be done, or you need more surgery or another type of treatment. A biopsy is done when there is: Any undiagnosed breast mass.   Nipple abnormalities, dimpling, crusting, or ulcerations.   Calcium deposits (calcifications) or abnormalities seen on your mammogram, ultrasound, or MRI.  Suspicious changes in the breast (thickening, asymmetry) seen on mammogram.   Abnormal discharge from the nipple, especially blood.   Redness, swelling, and pain of the breast.  HOW A BIOPSY IS PERFORMED A biopsy is often performed on an outpatient basis (you go home the same day). This can be done in a hospital, clinic, or surgical center. Tissue samples (biopsies) are often done under local anesthesia (area is numbed). Sometimes general anesthetics are required, in which case you sleep through the procedure. Biopsies may remove the entire lump, a small piece of the lump, or a small sliver of tissue removed by needle. TYPES OF BREAST BIOPSY Fine needle aspiration. A thin needle is placed through the skin, to the lump or cyst, and cells are removed.   Core needle biopsy. A large needle with a special tip is placed through the skin, to the abnormality, and a  piece of tissue is removed.   Stereotactic biopsy. A core needle with a special X-ray is used, to direct the needle to the lump or abnormal area, which is difficult to feel or cannot be felt.   Vacuum-assisted biopsy. A hollow probe and a gentle vacuum remove a sample of tissue.   Ultrasound guided core needle biopsy. You lie on your stomach, with your breast through an opening, and a high frequency ultrasound helps guide the needle to the area of the abnormality.   Open biopsy. An incision is made in the breast, and a piece of the lump or the whole lump is removed.  LET YOUR CAREGIVER KNOW ABOUT: Allergies.   Medicines taken, including herbs, eye drops, over-the-counter medicines, and creams.   Use of steroids (by mouth or creams).   Previous problems with anesthetics or Novocaine.   If you are taking aspirin or blood thinners.   Possibility of pregnancy, if this applies.   History of blood clots (thrombophlebitis).   History of bleeding or blood problems.   Previous surgery.   Other health problems.  RISKS AND COMPLICATIONS   Bleeding.   Infection.   Allergy to medicines.   Bruising and swelling of the breast.   Alteration in the shape of the breast.   Not finding the lump or abnormality.   Needing more surgery.  BEFORE THE PROCEDURE You should arrive 60 minutes prior to your procedure or as directed.   Check-in at the admissions desk, to fill out necessary forms, if you are not preregistered.   There will be consent forms to sign, prior to the procedure.   There is a waiting area for your family, while you are having your biopsy.   Try to have someone with you, to drive you home.   Do not smoke for 2 weeks before the surgery.   Let your caregiver know if you develop a cold or an infection.   Do not drink alcohol for at least 24 hours before surgery.   Wear a good support bra to the surgery.  AFTER THE PROCEDURE After surgery, you will be taken to the recovery area, where a nurse  will watch and check your progress. Once you are awake, stable, and taking fluids well, if there are no other problems, you will be allowed to go home.   Ice packs applied to your operative site may help with discomfort and keep the swelling down.   You may resume normal diet and activities as directed. Avoid strenuous activities affecting the arm on the side of the biopsy, such as tennis, swimming, heavy lifting (  more than 10 pounds) or pulling.   Bruising in the breast is normal following this procedure.   Wearing a support bra, even to bed, may be more comfortable. The bra will also help keep the dressing on.   Change dressings as directed.   Your doctor may apply a pressure dressing on your breast for 24 to 48 hours.   Only take over-the-counter or prescription medicines for pain, discomfort, or fever as directed by your caregiver.   Do not take aspirin, because it can cause bleeding.  HOME CARE INSTRUCTIONS   You may resume your usual diet.   Have someone drive you home after the surgery.   Do not do any exercise, driving, lifting or general activities without your caregiver's permission.   Take medicines and over-the-counter medicines, as ordered by your caregiver.   Keep your postoperative appointments as recommended.   Do not drink alcohol while taking pain medicine.  Finding out the results of your test Not all test results are available during your visit. If your test results are not back during the visit, make an appointment with your caregiver to find out the results. Do not assume everything is normal if you have not heard from your caregiver or the medical facility. It is important for you to follow up on all of your test results.   SEEK MEDICAL CARE IF:   You notice redness, swelling, or increasing pain in the wound.   You notice a bad smell coming from the wound or dressing.   You develop a rash.   You need stronger pain medicine.   You are having an allergic reaction or  problems with your medicines.  SEEK IMMEDIATE MEDICAL CARE IF:   You have difficulty breathing.   You have a fever.   There is increased bleeding (more than a small spot) from the wound.   Pus is coming from the wound.   The wound is breaking open.  Document Released: 04/07/2005 Document Revised: 12/18/2010 Document Reviewed: 02/23/2009 [facility] Patient Information 2012 [place], [place].       Level of Service     PR OFFICE/OUTPT VISIT,NEW,LEVL III [id number]         All Flowsheet Templates (all recorded)     Encounter Vitals Flowsheet    Custom Formula Data Flowsheet    Anthropometrics Flowsheet               Referring Provider          Patterson Hammersmith       All Charges for This Encounter       Code Description Service Date Service Provider Modifiers Quantity    [phone] PR OFFICE/OUTPT VISIT,NEW,LEVL III 02/10/2011 Ernestene Mention, MD   1    [phone] PR PT VIS [facility] USE EHR CER ATCB 02/10/2011 Ernestene Mention, MD   1    [phone] PR CURRENT TOBACCO NON-USER 02/10/2011 Ernestene Mention, MD   1        Other Encounter Related Information     Allergies & Medications         Problem List         History         Patient-Entered Questionnaires     No data filed

## 2011-02-28 NOTE — Op Note (Signed)
Lumpectomy OP Note  Preop diagnosis: radial scar and calcifications right breast, 3:30 position  Postop diagnosis: radial scar and calcifications right breast, 3:30 position  Operation performed: right partial mastectomy with needle localization  Surgeon:  Ernestene Mention  Operative indications: this is a 65 year old Caucasian female who had screening mammograms which showed an area of calcifications in the medial area of the right breast approximately 3:30 position. Decision was made to biopsy this area and the radiologist did biopsy this and this showed a radial scar. This is felt to carry increased risk for occult carcinoma and she was referred for excision. I have reviewed her x-rays and agreed that excision of this area is indicated. She is very much in agreement with this as well.  Operative technique: the patient underwent wire localization at the breast center of [facility] this morning. The wire entered medially and was directed laterally. The patient was brought to the holding area at the hospital. I reviewed her films and the wire position looks good. She was taken the operating room. General Anesthesia was induced. Intravenous antibiotics were given. The right breast was prepped and draped in a sterile fashion. Surgical timeout was held identifying correct patient correct procedure, correct site.  0.5% Marcaine with epinephrine was used as a local infiltration anesthetic. Transverse radially oriented scar was made about the 3:00 position extending from the wire insertion site medially. Dissection was carried down into the breast tissue and around the localizing wire. The specimen was removed and marked with the 6 color margin marker kit. Specimen mammogram was performed. The radiologist felt that we completely excised the  area of concern. The specimen was marked and sent to pathology. Hemostasis was excellent and achieved with electrocautery. After irrigating the wound we closed the  breast tissue in layers with interrupted sutures of 3-0 Vicryl and the skin with a running subcuticular suture of 4-0 Monocryl and Dermabond. Ice pack was placed and the patient taken to recovery in stable condition. Estimate blood loss was about 10 cc consultations none sponge needle and sponge counts were correct.Ernestene Mention 02/28/2011 1:27 PM

## 2011-03-03 ENCOUNTER — Telehealth (INDEPENDENT_AMBULATORY_CARE_PROVIDER_SITE_OTHER): Payer: Self-pay

## 2011-03-03 NOTE — Telephone Encounter (Signed)
Pt home doing well. Pt given po appt for 13-3.

## 2011-03-04 ENCOUNTER — Telehealth (INDEPENDENT_AMBULATORY_CARE_PROVIDER_SITE_OTHER): Payer: Self-pay

## 2011-03-04 NOTE — Telephone Encounter (Signed)
Pt advised of path result. 

## 2011-03-06 ENCOUNTER — Encounter (HOSPITAL_COMMUNITY): Payer: Self-pay | Admitting: General Surgery

## 2011-03-24 ENCOUNTER — Ambulatory Visit (INDEPENDENT_AMBULATORY_CARE_PROVIDER_SITE_OTHER): Payer: Medicare Other | Admitting: General Surgery

## 2011-03-24 ENCOUNTER — Encounter (INDEPENDENT_AMBULATORY_CARE_PROVIDER_SITE_OTHER): Payer: Self-pay | Admitting: General Surgery

## 2011-03-24 VITALS — BP 142/88 | HR 64 | Temp 98.0°F | Resp 18 | Ht 67.0 in | Wt 204.0 lb

## 2011-03-24 DIAGNOSIS — N6019 Diffuse cystic mastopathy of unspecified breast: Secondary | ICD-10-CM

## 2011-03-24 NOTE — Progress Notes (Signed)
Subjective:     Patient ID: [name], female   DOB: December 12, 1945, 65 y.o.   MRN: [id number]  HPI  This patient underwent right partial mastectomy with needle localization on February 28, 2011. Final pathology report showed fibrocystic disease and a benign radial sclerosing lesion. She has no complaints about healing. She was given a copy of her pathology report. Review of Systems     Objective:   Physical Exam Right breast wound medially is healing uneventfully. There is no hematoma, no infection, the tissues are soft.    Assessment:    Calcifications right breast, 3:30 position. Status post right partial mastectomy with needle localization.  Pathology shows benign radial sclerosing lesion with fibrocystic change.    Plan:     Pathology report was discussed. The patient is advised to get annual mammography and annual breast exam with her primary care physician. Return to see me p.r.n.

## 2011-03-24 NOTE — Patient Instructions (Signed)
The final pathology report of your right breast biopsy shows fibrocystic change, radial sclerosing lesion, but no malignancy. Your wound appears to be healing normally. I advised that you get a mammogram every year from here on out. I advised an annual breast exam with your primary care physician. Return to see me if new problems arise.

## 2011-06-03 ENCOUNTER — Emergency Department (HOSPITAL_COMMUNITY): Payer: Medicare Other

## 2011-06-03 ENCOUNTER — Emergency Department (HOSPITAL_COMMUNITY)
Admission: EM | Admit: 2011-06-03 | Discharge: 2011-06-03 | Disposition: A | Discharge status: Home / Self Care | Payer: Medicare Other | Attending: Emergency Medicine | Admitting: Emergency Medicine

## 2011-06-03 ENCOUNTER — Encounter (HOSPITAL_COMMUNITY): Payer: Self-pay | Admitting: Emergency Medicine

## 2011-06-03 DIAGNOSIS — I4891 Unspecified atrial fibrillation: Secondary | ICD-10-CM | POA: Insufficient documentation

## 2011-06-03 DIAGNOSIS — R10812 Left upper quadrant abdominal tenderness: Secondary | ICD-10-CM | POA: Insufficient documentation

## 2011-06-03 DIAGNOSIS — R109 Unspecified abdominal pain: Secondary | ICD-10-CM | POA: Insufficient documentation

## 2011-06-03 DIAGNOSIS — R079 Chest pain, unspecified: Secondary | ICD-10-CM | POA: Insufficient documentation

## 2011-06-03 DIAGNOSIS — I10 Essential (primary) hypertension: Secondary | ICD-10-CM | POA: Insufficient documentation

## 2011-06-03 DIAGNOSIS — F3289 Other specified depressive episodes: Secondary | ICD-10-CM | POA: Insufficient documentation

## 2011-06-03 DIAGNOSIS — Z79899 Other long term (current) drug therapy: Secondary | ICD-10-CM | POA: Insufficient documentation

## 2011-06-03 DIAGNOSIS — M069 Rheumatoid arthritis, unspecified: Secondary | ICD-10-CM | POA: Insufficient documentation

## 2011-06-03 DIAGNOSIS — F329 Major depressive disorder, single episode, unspecified: Secondary | ICD-10-CM | POA: Insufficient documentation

## 2011-06-03 DIAGNOSIS — M549 Dorsalgia, unspecified: Secondary | ICD-10-CM | POA: Insufficient documentation

## 2011-06-03 DIAGNOSIS — E119 Type 2 diabetes mellitus without complications: Secondary | ICD-10-CM | POA: Insufficient documentation

## 2011-06-03 DIAGNOSIS — J4489 Other specified chronic obstructive pulmonary disease: Secondary | ICD-10-CM | POA: Insufficient documentation

## 2011-06-03 DIAGNOSIS — R110 Nausea: Secondary | ICD-10-CM | POA: Insufficient documentation

## 2011-06-03 DIAGNOSIS — B029 Zoster without complications: Secondary | ICD-10-CM | POA: Insufficient documentation

## 2011-06-03 DIAGNOSIS — J449 Chronic obstructive pulmonary disease, unspecified: Secondary | ICD-10-CM | POA: Insufficient documentation

## 2011-06-03 LAB — COMPREHENSIVE METABOLIC PANEL
Albumin: 3.8 g/dL (ref 3.5–5.2)
Alkaline Phosphatase: 59 U/L (ref 39–117)
BUN: 23 mg/dL (ref 6–23)
Calcium: 10 mg/dL (ref 8.4–10.5)
GFR calc Af Amer: 70 mL/min — ABNORMAL LOW (ref >90)
Glucose, Bld: 91 mg/dL (ref 70–99)
Potassium: 3.5 mEq/L (ref 3.5–5.1)
Total Protein: 7.7 g/dL (ref 6.0–8.3)

## 2011-06-03 LAB — URINALYSIS, ROUTINE W REFLEX MICROSCOPIC
Bilirubin Urine: NEGATIVE
Hgb urine dipstick: NEGATIVE
Ketones, ur: NEGATIVE mg/dL
Nitrite: NEGATIVE
Urobilinogen, UA: 0.2 mg/dL (ref 0.0–1.0)

## 2011-06-03 LAB — CBC
Hemoglobin: 12.6 g/dL (ref 12.0–15.0)
MCH: 28.6 pg (ref 26.0–34.0)
MCHC: 33.6 g/dL (ref 30.0–36.0)
MCV: 85 fL (ref 78.0–100.0)
Platelets: 400 10*3/uL (ref 150–400)
RBC: 4.41 MIL/uL (ref 3.87–5.11)

## 2011-06-03 LAB — DIFFERENTIAL
Basophils Relative: 0 % (ref 0–1)
Eosinophils Absolute: 0.3 10*3/uL (ref 0.0–0.7)
Eosinophils Relative: 2 % (ref 0–5)
Lymphs Abs: 4.5 10*3/uL — ABNORMAL HIGH (ref 0.7–4.0)
Monocytes Relative: 7 % (ref 3–12)
Neutrophils Relative %: 62 % (ref 43–77)

## 2011-06-03 LAB — LIPASE, BLOOD: Lipase: 28 U/L (ref 11–59)

## 2011-06-03 LAB — LACTIC ACID, PLASMA: Lactic Acid, Venous: 0.9 mmol/L (ref 0.5–2.2)

## 2011-06-03 LAB — D-DIMER, QUANTITATIVE: D-Dimer, Quant: 2.18 ug/mL-FEU — ABNORMAL HIGH (ref 0.00–0.48)

## 2011-06-03 MED ORDER — VALACYCLOVIR HCL 1 G PO TABS: 1000.0000 mg | ORAL_TABLET | Freq: Three times a day (TID) | ORAL | Status: AC

## 2011-06-03 MED ORDER — ONDANSETRON HCL 4 MG/2ML IJ SOLN
4.0000 mg | Freq: Once | INTRAMUSCULAR | Status: AC
  Administered 2011-06-03: 4 mg via INTRAVENOUS
  Filled 2011-06-03: qty 2

## 2011-06-03 MED ORDER — IOHEXOL 300 MG/ML  SOLN
20.0000 mL | INTRAMUSCULAR | Status: AC
  Administered 2011-06-03: 20 mL via ORAL

## 2011-06-03 MED ORDER — IOHEXOL 300 MG/ML  SOLN
100.0000 mL | Freq: Once | INTRAMUSCULAR | Status: AC | PRN
  Administered 2011-06-03: 100 mL via INTRAVENOUS

## 2011-06-03 MED ORDER — OXYCODONE-ACETAMINOPHEN 5-325 MG PO TABS: 2.0000 | ORAL_TABLET | ORAL | Status: AC | PRN

## 2011-06-03 MED ORDER — MORPHINE SULFATE 4 MG/ML IJ SOLN
4.0000 mg | Freq: Once | INTRAMUSCULAR | Status: AC
  Administered 2011-06-03: 4 mg via INTRAVENOUS
  Filled 2011-06-03: qty 1

## 2011-06-03 MED ORDER — SODIUM CHLORIDE 0.9 % IV BOLUS (SEPSIS)
1000.0000 mL | Freq: Once | INTRAVENOUS | Status: AC
  Administered 2011-06-03: 1000 mL via INTRAVENOUS

## 2011-06-03 NOTE — ED Notes (Signed)
Pt returned from CT °

## 2011-06-03 NOTE — ED Notes (Signed)
1st cup of contrast given to drink.  

## 2011-06-03 NOTE — ED Notes (Signed)
Pt sts that she is having abd pain since last Thursday. Pt has a stabbing deep pain. Pt was taking home medications which helped for a little, but pain has increased today. Pt has some nausea without diarrhea. Last normal BM was yesterday around 2000. Denies any trouble with urination.

## 2011-06-03 NOTE — ED Provider Notes (Signed)
History     CSN: [id number]  Arrival date & time 06/03/11  3875   First MD Initiated Contact with Patient 06/03/11 0500      Chief Complaint  Patient presents with  . Abdominal Pain    (Consider location/radiation/quality/duration/timing/severity/associated sxs/prior treatment) HPI Comments: Patient presents with burning and stabbing abdominal pain for the past 5 days. The pain comes and goes and varies in intensity but is always present as a soreness and feels hot to the touch. Patient's been taking hydrocodone at home with some relief until today when her pain was not relieved any longer. She denies any vomiting follicle nausea today had a normal bowel movement yesterday. She denies any dysuria, hematuria. Denies any chest pain or shortness of breath. She denies any vaginal bleeding or discharge. The pain radiates from her left upper quadrant around her flank to her left back. Is not worsened by anything.  The history is provided by the patient.    Past Medical History  Diagnosis Date  . Cerebral aneurysm   . Hypertension   . Atrial fibrillation   . Arthritis   . Anemia   . Asthma   . Aneurysm     x2  . Complication of anesthesia     Has woken up during surgery before  . Brain aneurysm     have a "giant aneurysm, Dr. Bedelia Person has stented and coiled in past x 4  . Shortness of breath   . Bronchitis     history of  . Sleep apnea     does not use cpap  . Diabetes mellitus     metformin  . Adrenal disorder, other     "adrenal Incidentaloma"  . Urinary tract infection     history of  . GERD (gastroesophageal reflux disease)     omeprazole  . Rheumatoid arthritis     has had for approx. 40years, sees Dr. [name]  . Depression     due to loss of spouse 6 years ago  . COPD (chronic obstructive pulmonary disease)     Dr. [name], uses 2.5L of o2 as needed  . Emphysema of lung     requested anethesia consult     Past Surgical History  Procedure Date  . Tubal  ligation   . Hand surgery     bilateral  . Hand surgery     x 2  . Brain surgery     coiling and stenting  . Mastectomy, partial 02/28/2011    Procedure: MASTECTOMY PARTIAL;  Surgeon: Ernestene Mention, MD;  Location: [facility] OR;  Service: General;  Laterality: Right;  RIGHT PARTIAL MASTECTOMY  WITH NEEDLE LOCALIZATION  . Breast surgery 02/28/11    right partial mastectomy     Family History  Problem Relation Age of Onset  . Heart disease Mother   . Cancer Father     lung  . Cancer Sister     breast    History  Substance Use Topics  . Smoking status: Former Smoker -- 1.5 packs/day for 40 years    Types: Cigarettes  . Smokeless tobacco: Never Used   Comment: quit 2006  . Alcohol Use: 0.6 oz/week    1 Shots of liquor per week    OB History    Grav Para Term Preterm Abortions TAB SAB Ect Mult Living                  Review of Systems  Constitutional: Negative for fever and activity change.  HENT: Negative for congestion and rhinorrhea.   Respiratory: Negative for cough, chest tightness and shortness of breath.   Cardiovascular: Negative for chest pain.  Gastrointestinal: Positive for nausea and abdominal pain. Negative for vomiting, diarrhea and constipation.  Genitourinary: Negative for dysuria, hematuria, vaginal bleeding and vaginal discharge.  Musculoskeletal: Positive for back pain.  Skin: Positive for rash.  Neurological: Negative for dizziness, weakness and headaches.    Allergies  Gold salts and Tape  Home Medications   Current Outpatient Rx  Name Route Sig Dispense Refill  . ALBUTEROL SULFATE HFA 108 (90 BASE) MCG/ACT IN AERS Inhalation Inhale 2 puffs into the lungs every 6 (six) hours as needed. For shortness of breath    . BISOPROLOL FUMARATE 5 MG PO TABS Oral Take 5 mg by mouth daily.      Marland Kitchen VICODIN PO Oral Take 0.5 tablets by mouth every 4 (four) hours as needed. For pain    . METFORMIN HCL 500 MG PO TABS Oral Take 500 mg by mouth 2 (two) times daily with  a meal.      . OMEPRAZOLE 20 MG PO CPDR Oral Take 20 mg by mouth daily.      Marland Kitchen PREDNISONE 5 MG PO TABS Oral Take 5 mg by mouth daily.      Marland Kitchen SPIRONOLACTONE-HCTZ 25-25 MG PO TABS Oral Take 1 tablet by mouth daily.      Marland Kitchen TIOTROPIUM BROMIDE MONOHYDRATE 18 MCG IN CAPS Inhalation Place 18 mcg into inhaler and inhale daily.     Marland Kitchen ZOLPIDEM TARTRATE 10 MG PO TABS Oral Take 10 mg by mouth at bedtime as needed. For sleep      BP 139/83  Temp(Src) 97.9 F (36.6 C) (Oral)  Resp 22  Ht 5\' 6"  (1.676 m)  Wt 200 lb (90.719 kg)  BMI 32.28 kg/m2  SpO2 96%  Physical Exam  Constitutional: She is oriented to person, place, and time. She appears well-developed. No distress.  HENT:  Head: Normocephalic and atraumatic.  Mouth/Throat: Oropharynx is clear and moist. No oropharyngeal exudate.  Eyes: Conjunctivae are normal. Pupils are equal, round, and reactive to light.  Neck: Normal range of motion.  Cardiovascular: Normal rate, regular rhythm and normal heart sounds.   Pulmonary/Chest: Effort normal and breath sounds normal. No respiratory distress. She exhibits no tenderness.  Abdominal: Soft. There is tenderness. There is no rebound and no guarding.         Tenderness to palpation left upper quadrant, no guarding or rebound. Tenderness extends to left inferior lateral ribs  Musculoskeletal: Normal range of motion. She exhibits no edema and no tenderness.       Arms: Neurological: She is alert and oriented to person, place, and time. No cranial nerve deficit.  Skin: Skin is warm.    ED Course  Procedures (including critical care time)   Labs Reviewed  URINALYSIS, ROUTINE W REFLEX MICROSCOPIC  CBC  DIFFERENTIAL  COMPREHENSIVE METABOLIC PANEL  LIPASE, BLOOD  LACTIC ACID, PLASMA  D-DIMER, QUANTITATIVE   No results found.   No diagnosis found.    MDM  Abdominal pain without associated symptoms. Abdomen soft and tender in the left upper quadrant. Patient does have a vesicular rash and  dermatomal distribution on the left side of her abdomen and left back.  Most likely herpes zoster.  Leukocytosis most likely from chronic steroid use.  Labs otherwise unremarkable.   CT abdomen pending to evaluate for intraabdominal cause of pain, but herpes zoster expected as source.  Dr. Radford Pax to followup CT results and disposition patient.    Glynn Octave, MD 06/03/11 2242

## 2011-06-03 NOTE — ED Notes (Signed)
Patient presents to ed with c/o left lower abd. Pain onset thurs. States she has been taking some vicodan that she had , had with a previous surgery. States the pain was worse at nignt she would take medication and was able to sleep all night. Pain is usually worse when sitting down or being still. Last pm pain medication didn't help. States the pain is sharp in nature . Denies bowel or bladder problems. Last bm was last pm states it was looser than normal. C/o nausea unsure if its from not having sleep or from the pain.

## 2011-06-03 NOTE — Discharge Instructions (Signed)

## 2011-06-03 NOTE — ED Notes (Signed)
Attempted x 2 to start iv unsuccessful iv team called.

## 2011-12-30 ENCOUNTER — Other Ambulatory Visit (HOSPITAL_COMMUNITY): Payer: Self-pay | Admitting: Internal Medicine

## 2011-12-30 DIAGNOSIS — J449 Chronic obstructive pulmonary disease, unspecified: Secondary | ICD-10-CM

## 2012-01-07 ENCOUNTER — Ambulatory Visit (HOSPITAL_COMMUNITY)
Admission: RE | Admit: 2012-01-07 | Discharge: 2012-01-07 | Disposition: A | Discharge status: Home / Self Care | Payer: Medicare Other | Source: Ambulatory Visit | Attending: Internal Medicine | Admitting: Internal Medicine

## 2012-01-07 DIAGNOSIS — J4489 Other specified chronic obstructive pulmonary disease: Secondary | ICD-10-CM | POA: Insufficient documentation

## 2012-01-07 DIAGNOSIS — J449 Chronic obstructive pulmonary disease, unspecified: Secondary | ICD-10-CM | POA: Insufficient documentation

## 2012-01-07 MED ORDER — ALBUTEROL SULFATE (5 MG/ML) 0.5% IN NEBU
2.5000 mg | INHALATION_SOLUTION | Freq: Once | RESPIRATORY_TRACT | Status: AC
Start: 2012-01-07 — End: 2012-01-07
  Administered 2012-01-07: 2.5 mg via RESPIRATORY_TRACT

## 2013-04-01 ENCOUNTER — Other Ambulatory Visit (HOSPITAL_COMMUNITY): Payer: Self-pay | Admitting: Interventional Radiology

## 2013-04-01 DIAGNOSIS — I729 Aneurysm of unspecified site: Secondary | ICD-10-CM

## 2013-05-02 ENCOUNTER — Ambulatory Visit (HOSPITAL_COMMUNITY)
Admission: RE | Admit: 2013-05-02 | Discharge: 2013-05-02 | Disposition: A | Discharge status: Home / Self Care | Payer: Medicare Other | Source: Ambulatory Visit | Attending: Interventional Radiology | Admitting: Interventional Radiology

## 2013-05-02 DIAGNOSIS — I729 Aneurysm of unspecified site: Secondary | ICD-10-CM

## 2013-05-04 ENCOUNTER — Other Ambulatory Visit (HOSPITAL_COMMUNITY): Payer: Self-pay | Admitting: Interventional Radiology

## 2013-05-04 DIAGNOSIS — I671 Cerebral aneurysm, nonruptured: Secondary | ICD-10-CM

## 2013-05-17 ENCOUNTER — Other Ambulatory Visit: Payer: Self-pay | Admitting: Radiology

## 2013-05-19 ENCOUNTER — Other Ambulatory Visit (HOSPITAL_COMMUNITY): Payer: Self-pay | Admitting: Interventional Radiology

## 2013-05-19 DIAGNOSIS — I671 Cerebral aneurysm, nonruptured: Secondary | ICD-10-CM

## 2013-05-20 ENCOUNTER — Ambulatory Visit (HOSPITAL_COMMUNITY): Admission: RE | Admit: 2013-05-20 | Payer: Medicare Other | Source: Ambulatory Visit

## 2013-06-13 ENCOUNTER — Ambulatory Visit (HOSPITAL_COMMUNITY)
Admission: RE | Admit: 2013-06-13 | Discharge: 2013-06-13 | Disposition: A | Discharge status: Home / Self Care | Payer: Medicare Other | Source: Ambulatory Visit | Attending: Interventional Radiology | Admitting: Interventional Radiology

## 2013-06-13 DIAGNOSIS — I6789 Other cerebrovascular disease: Secondary | ICD-10-CM | POA: Insufficient documentation

## 2013-06-13 DIAGNOSIS — R51 Headache: Secondary | ICD-10-CM | POA: Insufficient documentation

## 2013-06-13 DIAGNOSIS — I671 Cerebral aneurysm, nonruptured: Secondary | ICD-10-CM | POA: Insufficient documentation

## 2013-06-13 DIAGNOSIS — Z9889 Other specified postprocedural states: Secondary | ICD-10-CM | POA: Insufficient documentation

## 2013-06-13 DIAGNOSIS — H538 Other visual disturbances: Secondary | ICD-10-CM | POA: Insufficient documentation

## 2013-06-13 LAB — CREATININE, SERUM
Creatinine, Ser: 1.24 mg/dL — ABNORMAL HIGH (ref 0.50–1.10)
GFR calc Af Amer: 51 mL/min — ABNORMAL LOW (ref >90)
GFR, EST NON AFRICAN AMERICAN: 44 mL/min — AB (ref >90)

## 2013-06-13 MED ORDER — GADOBENATE DIMEGLUMINE 529 MG/ML IV SOLN
20.0000 mL | Freq: Once | INTRAVENOUS | Status: AC
  Administered 2013-06-13: 20 mL via INTRAVENOUS

## 2013-06-15 ENCOUNTER — Telehealth (HOSPITAL_COMMUNITY): Payer: Self-pay | Admitting: Interventional Radiology

## 2013-06-15 NOTE — Telephone Encounter (Signed)
Called pt, left VM that we would see her in 1 yr for next f/u MRI/MRA JM

## 2013-06-29 ENCOUNTER — Ambulatory Visit (HOSPITAL_COMMUNITY): Payer: Medicare Other

## 2013-09-01 ENCOUNTER — Other Ambulatory Visit: Payer: Self-pay | Admitting: Internal Medicine

## 2013-09-01 DIAGNOSIS — Z1231 Encounter for screening mammogram for malignant neoplasm of breast: Secondary | ICD-10-CM

## 2013-09-22 ENCOUNTER — Ambulatory Visit
Admission: RE | Admit: 2013-09-22 | Discharge: 2013-09-22 | Disposition: A | Discharge status: Home / Self Care | Payer: Medicare Other | Source: Ambulatory Visit | Attending: Internal Medicine | Admitting: Internal Medicine

## 2013-09-22 ENCOUNTER — Encounter (INDEPENDENT_AMBULATORY_CARE_PROVIDER_SITE_OTHER): Payer: Self-pay

## 2013-09-22 DIAGNOSIS — Z1231 Encounter for screening mammogram for malignant neoplasm of breast: Secondary | ICD-10-CM

## 2014-04-11 ENCOUNTER — Other Ambulatory Visit: Payer: Self-pay | Admitting: Nurse Practitioner

## 2014-04-11 ENCOUNTER — Ambulatory Visit
Admission: RE | Admit: 2014-04-11 | Discharge: 2014-04-11 | Disposition: A | Discharge status: Home / Self Care | Payer: Medicare Other | Source: Ambulatory Visit | Attending: Nurse Practitioner | Admitting: Nurse Practitioner

## 2014-04-11 DIAGNOSIS — J441 Chronic obstructive pulmonary disease with (acute) exacerbation: Secondary | ICD-10-CM

## 2014-06-22 ENCOUNTER — Telehealth (HOSPITAL_COMMUNITY): Payer: Self-pay | Admitting: Interventional Radiology

## 2014-06-22 ENCOUNTER — Other Ambulatory Visit (HOSPITAL_COMMUNITY): Payer: Self-pay | Admitting: Interventional Radiology

## 2014-06-22 DIAGNOSIS — I671 Cerebral aneurysm, nonruptured: Secondary | ICD-10-CM

## 2014-06-22 NOTE — Telephone Encounter (Signed)
Called pt to schedule 1 yr MRI/MRA aneurysm follow-up. Pt states that her health problems have become much worse with her COPD and if this is not a true necessity she would like to wait and do a 2 yr follow-up study. I told her I would ask Deveshwar and let her know. She is in agreement with that. JM

## 2015-02-06 ENCOUNTER — Emergency Department (HOSPITAL_COMMUNITY): Payer: Medicare Other

## 2015-02-06 ENCOUNTER — Encounter (HOSPITAL_COMMUNITY): Payer: Self-pay | Admitting: *Deleted

## 2015-02-06 ENCOUNTER — Inpatient Hospital Stay (HOSPITAL_COMMUNITY)
Admission: EM | Admit: 2015-02-06 | Discharge: 2015-02-09 | DRG: 183 | Disposition: A | Discharge status: Home Health | Payer: Medicare Other | Attending: Internal Medicine | Admitting: Internal Medicine

## 2015-02-06 DIAGNOSIS — Z803 Family history of malignant neoplasm of breast: Secondary | ICD-10-CM

## 2015-02-06 DIAGNOSIS — Z9981 Dependence on supplemental oxygen: Secondary | ICD-10-CM

## 2015-02-06 DIAGNOSIS — S2249XA Multiple fractures of ribs, unspecified side, initial encounter for closed fracture: Secondary | ICD-10-CM | POA: Diagnosis present

## 2015-02-06 DIAGNOSIS — M069 Rheumatoid arthritis, unspecified: Secondary | ICD-10-CM

## 2015-02-06 DIAGNOSIS — D649 Anemia, unspecified: Secondary | ICD-10-CM | POA: Diagnosis not present

## 2015-02-06 DIAGNOSIS — E669 Obesity, unspecified: Secondary | ICD-10-CM | POA: Diagnosis not present

## 2015-02-06 DIAGNOSIS — F329 Major depressive disorder, single episode, unspecified: Secondary | ICD-10-CM | POA: Diagnosis present

## 2015-02-06 DIAGNOSIS — E118 Type 2 diabetes mellitus with unspecified complications: Secondary | ICD-10-CM

## 2015-02-06 DIAGNOSIS — W010XXA Fall on same level from slipping, tripping and stumbling without subsequent striking against object, initial encounter: Secondary | ICD-10-CM | POA: Diagnosis present

## 2015-02-06 DIAGNOSIS — K219 Gastro-esophageal reflux disease without esophagitis: Secondary | ICD-10-CM | POA: Diagnosis present

## 2015-02-06 DIAGNOSIS — J9620 Acute and chronic respiratory failure, unspecified whether with hypoxia or hypercapnia: Secondary | ICD-10-CM | POA: Diagnosis not present

## 2015-02-06 DIAGNOSIS — J9621 Acute and chronic respiratory failure with hypoxia: Secondary | ICD-10-CM | POA: Diagnosis present

## 2015-02-06 DIAGNOSIS — N179 Acute kidney failure, unspecified: Secondary | ICD-10-CM

## 2015-02-06 DIAGNOSIS — R0602 Shortness of breath: Secondary | ICD-10-CM | POA: Diagnosis not present

## 2015-02-06 DIAGNOSIS — J449 Chronic obstructive pulmonary disease, unspecified: Secondary | ICD-10-CM | POA: Diagnosis present

## 2015-02-06 DIAGNOSIS — G4730 Sleep apnea, unspecified: Secondary | ICD-10-CM | POA: Diagnosis present

## 2015-02-06 DIAGNOSIS — S2242XA Multiple fractures of ribs, left side, initial encounter for closed fracture: Secondary | ICD-10-CM | POA: Diagnosis not present

## 2015-02-06 DIAGNOSIS — S2239XA Fracture of one rib, unspecified side, initial encounter for closed fracture: Secondary | ICD-10-CM | POA: Diagnosis present

## 2015-02-06 DIAGNOSIS — I4891 Unspecified atrial fibrillation: Secondary | ICD-10-CM | POA: Diagnosis present

## 2015-02-06 DIAGNOSIS — D509 Iron deficiency anemia, unspecified: Secondary | ICD-10-CM | POA: Diagnosis present

## 2015-02-06 DIAGNOSIS — R42 Dizziness and giddiness: Secondary | ICD-10-CM

## 2015-02-06 DIAGNOSIS — J45909 Unspecified asthma, uncomplicated: Secondary | ICD-10-CM | POA: Diagnosis present

## 2015-02-06 DIAGNOSIS — I10 Essential (primary) hypertension: Secondary | ICD-10-CM

## 2015-02-06 DIAGNOSIS — J441 Chronic obstructive pulmonary disease with (acute) exacerbation: Secondary | ICD-10-CM

## 2015-02-06 DIAGNOSIS — E1165 Type 2 diabetes mellitus with hyperglycemia: Secondary | ICD-10-CM | POA: Diagnosis present

## 2015-02-06 DIAGNOSIS — Z87891 Personal history of nicotine dependence: Secondary | ICD-10-CM

## 2015-02-06 DIAGNOSIS — J438 Other emphysema: Secondary | ICD-10-CM

## 2015-02-06 DIAGNOSIS — Y92009 Unspecified place in unspecified non-institutional (private) residence as the place of occurrence of the external cause: Secondary | ICD-10-CM

## 2015-02-06 DIAGNOSIS — R921 Mammographic calcification found on diagnostic imaging of breast: Secondary | ICD-10-CM

## 2015-02-06 DIAGNOSIS — Z6841 Body Mass Index (BMI) 40.0 and over, adult: Secondary | ICD-10-CM

## 2015-02-06 DIAGNOSIS — Z79899 Other long term (current) drug therapy: Secondary | ICD-10-CM

## 2015-02-06 DIAGNOSIS — IMO0002 Reserved for concepts with insufficient information to code with codable children: Secondary | ICD-10-CM

## 2015-02-06 DIAGNOSIS — Z8673 Personal history of transient ischemic attack (TIA), and cerebral infarction without residual deficits: Secondary | ICD-10-CM

## 2015-02-06 HISTORY — DX: Obesity, unspecified: E66.9

## 2015-02-06 HISTORY — DX: Acute respiratory failure, unspecified whether with hypoxia or hypercapnia: J96.00

## 2015-02-06 LAB — CBC
HEMATOCRIT: 36.7 % (ref 36.0–46.0)
HEMOGLOBIN: 10.9 g/dL — AB (ref 12.0–15.0)
MCH: 26.5 pg (ref 26.0–34.0)
MCHC: 29.7 g/dL — AB (ref 30.0–36.0)
MCV: 89.1 fL (ref 78.0–100.0)
Platelets: 365 10*3/uL (ref 150–400)
RBC: 4.12 MIL/uL (ref 3.87–5.11)
RDW: 15.4 % (ref 11.5–15.5)
WBC: 13.7 10*3/uL — ABNORMAL HIGH (ref 4.0–10.5)

## 2015-02-06 LAB — BASIC METABOLIC PANEL
Anion gap: 12 (ref 5–15)
BUN: 25 mg/dL — ABNORMAL HIGH (ref 6–20)
CO2: 35 mmol/L — ABNORMAL HIGH (ref 22–32)
Calcium: 8.9 mg/dL (ref 8.9–10.3)
Chloride: 89 mmol/L — ABNORMAL LOW (ref 101–111)
Creatinine, Ser: 1.21 mg/dL — ABNORMAL HIGH (ref 0.44–1.00)
GFR calc Af Amer: 52 mL/min — ABNORMAL LOW (ref >60)
GFR, EST NON AFRICAN AMERICAN: 45 mL/min — AB (ref >60)
GLUCOSE: 137 mg/dL — AB (ref 65–99)
POTASSIUM: 4.6 mmol/L (ref 3.5–5.1)
Sodium: 136 mmol/L (ref 135–145)

## 2015-02-06 LAB — CBG MONITORING, ED: GLUCOSE-CAPILLARY: 192 mg/dL — AB (ref 65–99)

## 2015-02-06 LAB — URINALYSIS, ROUTINE W REFLEX MICROSCOPIC
Bilirubin Urine: NEGATIVE
GLUCOSE, UA: NEGATIVE mg/dL
HGB URINE DIPSTICK: NEGATIVE
KETONES UR: NEGATIVE mg/dL
LEUKOCYTES UA: NEGATIVE
Nitrite: NEGATIVE
PH: 5 (ref 5.0–8.0)
PROTEIN: NEGATIVE mg/dL
Specific Gravity, Urine: 1.015 (ref 1.005–1.030)
Urobilinogen, UA: 0.2 mg/dL (ref 0.0–1.0)

## 2015-02-06 LAB — GLUCOSE, CAPILLARY
Glucose-Capillary: 186 mg/dL — ABNORMAL HIGH (ref 65–99)
Glucose-Capillary: 186 mg/dL — ABNORMAL HIGH (ref 65–99)

## 2015-02-06 LAB — MRSA PCR SCREENING: MRSA BY PCR: NEGATIVE

## 2015-02-06 MED ORDER — MORPHINE SULFATE (PF) 2 MG/ML IV SOLN
2.0000 mg | INTRAVENOUS | Status: DC | PRN
  Administered 2015-02-06 – 2015-02-08 (×3): 2 mg via INTRAVENOUS
  Filled 2015-02-06 (×3): qty 1

## 2015-02-06 MED ORDER — IPRATROPIUM-ALBUTEROL 0.5-2.5 (3) MG/3ML IN SOLN
3.0000 mL | Freq: Once | RESPIRATORY_TRACT | Status: AC
  Administered 2015-02-06: 3 mL via RESPIRATORY_TRACT
  Filled 2015-02-06: qty 3

## 2015-02-06 MED ORDER — ALBUTEROL SULFATE (2.5 MG/3ML) 0.083% IN NEBU: 2.5000 mg | INHALATION_SOLUTION | RESPIRATORY_TRACT | Status: DC | PRN

## 2015-02-06 MED ORDER — ALUM & MAG HYDROXIDE-SIMETH 200-200-20 MG/5ML PO SUSP: 30.0000 mL | Freq: Four times a day (QID) | ORAL | Status: DC | PRN

## 2015-02-06 MED ORDER — INSULIN ASPART 100 UNIT/ML ~~LOC~~ SOLN
0.0000 [IU] | Freq: Every day | SUBCUTANEOUS | Status: DC
  Administered 2015-02-07: 2 [IU] via SUBCUTANEOUS

## 2015-02-06 MED ORDER — IPRATROPIUM-ALBUTEROL 0.5-2.5 (3) MG/3ML IN SOLN
3.0000 mL | RESPIRATORY_TRACT | Status: DC
  Administered 2015-02-06: 3 mL via RESPIRATORY_TRACT
  Filled 2015-02-06: qty 3

## 2015-02-06 MED ORDER — ACETAMINOPHEN 325 MG PO TABS
650.0000 mg | ORAL_TABLET | Freq: Four times a day (QID) | ORAL | Status: DC | PRN
  Filled 2015-02-06: qty 2

## 2015-02-06 MED ORDER — ONDANSETRON HCL 4 MG/2ML IJ SOLN
4.0000 mg | Freq: Four times a day (QID) | INTRAMUSCULAR | Status: DC | PRN
  Filled 2015-02-06: qty 2

## 2015-02-06 MED ORDER — INSULIN ASPART 100 UNIT/ML ~~LOC~~ SOLN
0.0000 [IU] | Freq: Three times a day (TID) | SUBCUTANEOUS | Status: DC
  Administered 2015-02-06: 3 [IU] via SUBCUTANEOUS
  Administered 2015-02-07: 5 [IU] via SUBCUTANEOUS
  Administered 2015-02-07 (×2): 3 [IU] via SUBCUTANEOUS
  Administered 2015-02-08: 5 [IU] via SUBCUTANEOUS
  Administered 2015-02-08 – 2015-02-09 (×3): 3 [IU] via SUBCUTANEOUS

## 2015-02-06 MED ORDER — MORPHINE SULFATE (PF) 4 MG/ML IV SOLN
4.0000 mg | Freq: Once | INTRAVENOUS | Status: AC
  Administered 2015-02-06: 4 mg via INTRAVENOUS
  Filled 2015-02-06: qty 1

## 2015-02-06 MED ORDER — SODIUM CHLORIDE 0.9 % IJ SOLN: 3.0000 mL | INTRAMUSCULAR | Status: DC | PRN

## 2015-02-06 MED ORDER — HYDROCODONE-ACETAMINOPHEN 5-325 MG PO TABS
1.0000 | ORAL_TABLET | ORAL | Status: DC | PRN
  Administered 2015-02-06 – 2015-02-07 (×2): 1 via ORAL
  Filled 2015-02-06 (×2): qty 1

## 2015-02-06 MED ORDER — METHYLPREDNISOLONE SODIUM SUCC 40 MG IJ SOLR
40.0000 mg | Freq: Four times a day (QID) | INTRAMUSCULAR | Status: DC
  Administered 2015-02-06 – 2015-02-08 (×8): 40 mg via INTRAVENOUS
  Filled 2015-02-06 (×8): qty 1

## 2015-02-06 MED ORDER — PANTOPRAZOLE SODIUM 40 MG PO TBEC
40.0000 mg | DELAYED_RELEASE_TABLET | Freq: Every day | ORAL | Status: DC
  Administered 2015-02-06 – 2015-02-09 (×4): 40 mg via ORAL
  Filled 2015-02-06 (×4): qty 1

## 2015-02-06 MED ORDER — SODIUM CHLORIDE 0.9 % IV SOLN: 250.0000 mL | INTRAVENOUS | Status: DC | PRN

## 2015-02-06 MED ORDER — BUDESONIDE-FORMOTEROL FUMARATE 80-4.5 MCG/ACT IN AERO
2.0000 | INHALATION_SPRAY | Freq: Two times a day (BID) | RESPIRATORY_TRACT | Status: DC
  Administered 2015-02-06 – 2015-02-09 (×5): 2 via RESPIRATORY_TRACT
  Filled 2015-02-06: qty 6.9

## 2015-02-06 MED ORDER — ALBUTEROL SULFATE (2.5 MG/3ML) 0.083% IN NEBU: 2.5000 mg | INHALATION_SOLUTION | RESPIRATORY_TRACT | Status: DC

## 2015-02-06 MED ORDER — TRAZODONE HCL 50 MG PO TABS
50.0000 mg | ORAL_TABLET | Freq: Every day | ORAL | Status: DC
  Administered 2015-02-06 – 2015-02-08 (×3): 50 mg via ORAL
  Filled 2015-02-06 (×3): qty 1

## 2015-02-06 MED ORDER — ATENOLOL 50 MG PO TABS
25.0000 mg | ORAL_TABLET | Freq: Every day | ORAL | Status: DC
  Administered 2015-02-06 – 2015-02-09 (×4): 25 mg via ORAL
  Filled 2015-02-06 (×4): qty 1

## 2015-02-06 MED ORDER — ONDANSETRON HCL 4 MG PO TABS: 4.0000 mg | ORAL_TABLET | Freq: Four times a day (QID) | ORAL | Status: DC | PRN

## 2015-02-06 MED ORDER — ALBUTEROL SULFATE (2.5 MG/3ML) 0.083% IN NEBU
2.5000 mg | INHALATION_SOLUTION | Freq: Three times a day (TID) | RESPIRATORY_TRACT | Status: DC
  Administered 2015-02-07: 2.5 mg via RESPIRATORY_TRACT
  Filled 2015-02-06 (×2): qty 3

## 2015-02-06 MED ORDER — ENOXAPARIN SODIUM 30 MG/0.3ML ~~LOC~~ SOLN
30.0000 mg | SUBCUTANEOUS | Status: DC
  Administered 2015-02-06: 30 mg via SUBCUTANEOUS
  Filled 2015-02-06: qty 0.3

## 2015-02-06 MED ORDER — LEVOFLOXACIN IN D5W 750 MG/150ML IV SOLN
750.0000 mg | INTRAVENOUS | Status: DC
  Administered 2015-02-06 – 2015-02-09 (×4): 750 mg via INTRAVENOUS
  Filled 2015-02-06 (×4): qty 150

## 2015-02-06 MED ORDER — POLYETHYLENE GLYCOL 3350 17 G PO PACK
17.0000 g | PACK | Freq: Every day | ORAL | Status: DC | PRN
  Administered 2015-02-08 – 2015-02-09 (×2): 17 g via ORAL
  Filled 2015-02-06 (×2): qty 1

## 2015-02-06 MED ORDER — ACETAMINOPHEN 650 MG RE SUPP: 650.0000 mg | Freq: Four times a day (QID) | RECTAL | Status: DC | PRN

## 2015-02-06 MED ORDER — IPRATROPIUM BROMIDE 0.02 % IN SOLN: 0.5000 mg | RESPIRATORY_TRACT | Status: DC

## 2015-02-06 MED ORDER — TRAZODONE HCL 50 MG PO TABS: 50.0000 mg | ORAL_TABLET | Freq: Every day | ORAL | Status: DC

## 2015-02-06 MED ORDER — GUAIFENESIN-DM 100-10 MG/5ML PO SYRP: 5.0000 mL | ORAL_SOLUTION | ORAL | Status: DC | PRN

## 2015-02-06 MED ORDER — SODIUM CHLORIDE 0.9 % IJ SOLN
3.0000 mL | Freq: Two times a day (BID) | INTRAMUSCULAR | Status: DC
  Administered 2015-02-06 – 2015-02-09 (×7): 3 mL via INTRAVENOUS

## 2015-02-06 NOTE — Progress Notes (Signed)
New Admission Note:  Arrival Method: Stretcher  Mental Orientation: A&O X4 Telemetry: none Assessment: Completed Skin: no skin issues  IV: L AC Pain: 5 out of 10 in the left rib cage Tubes:none Safety Measures: Safety Fall Prevention Plan was given, discussed and signed.    Orders have been reviewed and implemented. Will continue to monitor the patient. Call light has been placed within reach and bed alarm has been activated.   Tonna Corner, RN  Phone Number: [phone]

## 2015-02-06 NOTE — ED Notes (Signed)
Per EMS- pt woke this morning and fell while walking to the bathroom. Pt reports left flank pain after hitting her side. Pt reports feeling generalized weakness. Pt had wheezing with EMS and received 5mg  of albuterol. Pt states that this is how she feels when she has an exacerbation starting. Pt is on 2.5L home O2.

## 2015-02-06 NOTE — ED Notes (Signed)
Dr Zavitz at bedside  

## 2015-02-06 NOTE — ED Notes (Signed)
Pt reports tenderness to palpation in left lower flank. Pain worse with inspiration and movement.

## 2015-02-06 NOTE — ED Notes (Signed)
Admitting np at bedside 

## 2015-02-06 NOTE — ED Provider Notes (Signed)
CSN: [id number]     Arrival date & time 02/06/15  2841 History   First MD Initiated Contact with Patient 02/06/15 [phone]     Chief Complaint  Patient presents with  . Fall  . COPD     (Consider location/radiation/quality/duration/timing/severity/associated sxs/prior Treatment) HPI Comments: 69 year old female with diabetes, asthma, emphysema on 2-3 L oxygen, goal oxygenation 90%, atrial fibrillation, past smoker presents with pleuritic pain in the left flank since falling back onto a chair prior to arrival. No loss of consciousness no other injuries. Patient recalls events, mechanical. Patient has not had pain meds since it happened. Pain with deep breath and palpation.  Patient is a 69 y.o. female presenting with fall. The history is provided by the patient.  Fall Pertinent negatives include no chest pain, no abdominal pain, no headaches and no shortness of breath.    Past Medical History  Diagnosis Date  . Cerebral aneurysm   . Hypertension   . Atrial fibrillation (HCC)   . Arthritis   . Anemia   . Asthma   . Aneurysm (HCC)     x2  . Complication of anesthesia     Has woken up during surgery before  . Brain aneurysm     have a "giant aneurysm, Dr. Bedelia Person has stented and coiled in past x 4  . Shortness of breath   . Bronchitis     history of  . Sleep apnea     does not use cpap  . Diabetes mellitus     metformin  . Adrenal disorder, other     "adrenal Incidentaloma"  . Urinary tract infection     history of  . GERD (gastroesophageal reflux disease)     omeprazole  . Rheumatoid arthritis(714.0)     has had for approx. 40years, sees Dr. [name]  . Depression     due to loss of spouse 6 years ago  . COPD (chronic obstructive pulmonary disease) (HCC)     Dr. [name], uses 2.5L of o2 as needed  . Emphysema of lung [facility])     requested anethesia consult    Past Surgical History  Procedure Laterality Date  . Tubal ligation    . Hand surgery      bilateral  . Hand  surgery      x 2  . Brain surgery      coiling and stenting  . Mastectomy, partial  02/28/2011    Procedure: MASTECTOMY PARTIAL;  Surgeon: Ernestene Mention, MD;  Location: [facility] OR;  Service: General;  Laterality: Right;  RIGHT PARTIAL MASTECTOMY  WITH NEEDLE LOCALIZATION  . Breast surgery  02/28/11    right partial mastectomy    Family History  Problem Relation Age of Onset  . Heart disease Mother   . Cancer Father     lung  . Cancer Sister     breast   Social History  Substance Use Topics  . Smoking status: Former Smoker -- 1.50 packs/day for 40 years    Types: Cigarettes  . Smokeless tobacco: Never Used     Comment: quit 2006  . Alcohol Use: 0.6 oz/week    1 Shots of liquor per week   OB History    No data available     Review of Systems  Constitutional: Negative for fever and chills.  HENT: Negative for congestion.   Eyes: Negative for visual disturbance.  Respiratory: Negative for shortness of breath.   Cardiovascular: Negative for chest pain.  Gastrointestinal: Negative for  vomiting and abdominal pain.  Genitourinary: Positive for flank pain. Negative for dysuria.  Musculoskeletal: Negative for back pain, neck pain and neck stiffness.  Skin: Negative for rash.  Neurological: Negative for light-headedness and headaches.      Allergies  Gold-containing drug products and Tape  Home Medications   Prior to Admission medications   Medication Sig Start Date End Date Taking? Authorizing Provider  albuterol (PROAIR HFA) 108 (90 BASE) MCG/ACT inhaler Inhale 2 puffs into the lungs every 6 (six) hours as needed. For shortness of breath    Historical Provider, MD  bisoprolol (ZEBETA) 5 MG tablet Take 5 mg by mouth daily.      Historical Provider, MD  Hydrocodone-Acetaminophen (VICODIN PO) Take 0.5 tablets by mouth every 4 (four) hours as needed. For pain    Historical Provider, MD  metFORMIN (GLUCOPHAGE) 500 MG tablet Take 500 mg by mouth 2 (two) times daily with a meal.       Historical Provider, MD  omeprazole (PRILOSEC) 20 MG capsule Take 20 mg by mouth daily.      Historical Provider, MD  predniSONE (DELTASONE) 5 MG tablet Take 5 mg by mouth daily.      Historical Provider, MD  spironolactone-hydrochlorothiazide (ALDACTAZIDE) 25-25 MG per tablet Take 1 tablet by mouth daily.      Historical Provider, MD  tiotropium (SPIRIVA) 18 MCG inhalation capsule Place 18 mcg into inhaler and inhale daily.     Historical Provider, MD  zolpidem (AMBIEN) 10 MG tablet Take 10 mg by mouth at bedtime as needed. For sleep    Historical Provider, MD   BP 130/54 mmHg  Pulse 94  Temp(Src) 97.5 F (36.4 C) (Oral)  Resp 24  SpO2 91% Physical Exam  Constitutional: She is oriented to person, place, and time. She appears well-developed and well-nourished.  HENT:  Head: Normocephalic and atraumatic.  Eyes: Conjunctivae are normal. Right eye exhibits no discharge. Left eye exhibits no discharge.  Neck: Normal range of motion. Neck supple. No tracheal deviation present.  Cardiovascular: Normal rate and regular rhythm.   Pulmonary/Chest: Effort normal. She has wheezes.  Abdominal: Soft. She exhibits no distension. There is no tenderness. There is no guarding.  Musculoskeletal: She exhibits tenderness. She exhibits no edema.  Patient has mild/moderate tenderness left mid flank along the rib margins. No obvious step-off however body habitus impedes.  Neurological: She is alert and oriented to person, place, and time.  Skin: Skin is warm. No rash noted.  Psychiatric: She has a normal mood and affect.  Nursing note and vitals reviewed.   ED Course  Procedures (including critical care time) Labs Review Labs Reviewed  BASIC METABOLIC PANEL - Abnormal; Notable for the following:    Chloride 89 (*)    CO2 35 (*)    Glucose, Bld 137 (*)    BUN 25 (*)    Creatinine, Ser 1.21 (*)    GFR calc non Af Amer 45 (*)    GFR calc Af Amer 52 (*)    All other components within normal limits   CBC - Abnormal; Notable for the following:    WBC 13.7 (*)    Hemoglobin 10.9 (*)    MCHC 29.7 (*)    All other components within normal limits    Imaging Review Dg Ribs Unilateral W/chest Left  02/06/2015  CLINICAL DATA:  Fall today.  Left chest pain EXAM: LEFT RIBS AND CHEST - 3+ VIEW COMPARISON:  04/11/2014 FINDINGS: Slightly displaced fracture left eighth rib  posterior laterally which appears acute. Fractures of the left seventh and ninth ribs also noted. No pneumothorax or effusion Mild bibasilar atelectasis.  Apical pleural scarring bilaterally IMPRESSION: Fractures left seventh, eighth, and ninth ribs. No pneumothorax or effusion. Bibasilar atelectasis. Electronically Signed   By: Marlan Palau M.D.   On: 02/06/2015 08:26   I have personally reviewed and evaluated these images and lab results as part of my medical decision-making.   EKG Interpretation None      MDM   Final diagnoses:  None   Patient presents with clinical rib contusion/fracture. Patient understands she is high risk with her COPD and lung disease. Plan for x-rays, pain meds, nebulizer.  Spirometer.  Discussed with hospitalist for admission due to multiple rib fractures, COPD history and difficulty controlling pain in the ER. Repeat pain meds ordered.  The patients results and plan were reviewed and discussed.   Any x-rays performed were independently reviewed by myself.   Differential diagnosis were considered with the presenting HPI.  Medications  morphine 4 MG/ML injection 4 mg (4 mg Intravenous Given 02/06/15 0757)  ipratropium-albuterol (DUONEB) 0.5-2.5 (3) MG/3ML nebulizer solution 3 mL (3 mLs Nebulization Given 02/06/15 0752)  morphine 4 MG/ML injection 4 mg (4 mg Intravenous Given 02/06/15 1011)  ipratropium-albuterol (DUONEB) 0.5-2.5 (3) MG/3ML nebulizer solution 3 mL (3 mLs Nebulization Given 02/06/15 1016)    Filed Vitals:   02/06/15 0745 02/06/15 0746 02/06/15 0800 02/06/15 0845  BP:  147/49 147/49  130/54  Pulse: 91 93 97 94  Temp:  97.5 F (36.4 C)    TempSrc:  Oral    Resp:  22 22 24   SpO2: 89% 93% 96% 91%    Final diagnoses:  None    Admission/ observation were discussed with the admitting physician, patient and/or family and they are comfortable with the plan.       , MD 02/06/15 1022

## 2015-02-06 NOTE — Progress Notes (Signed)
Physical Therapy Treatment Patient Details Name: [name] MRN: [id number] DOB: May 17, 1945 Today's Date: 02/06/2015    History of Present Illness [name] is a 69 y.o. female with past medical history that includes chronic respiratory failure on 2.5 L of oxygen at home related to COPD, afib, obesity, hypertension, anemia, diabetes, rheumatoid arthritis presents to the emergency department from home with the chief complaint of worsening shortness of breath after a mechanical fall. Initial evaluation reveals 3 fractured ribs on the left and acute on chronic respiratory failure    PT Comments    Pt admitted with/for worsening SOB and mechanical fall with 3 rib fractures.  Pt currently limited functionally due to the problems listed below.  (see problems list.)  Pt will benefit from PT to maximize function and safety to be able to get home safely with available assist of her sister..   Follow Up Recommendations  Home health PT;Other (comment) (initially up to 24 hour assist)     Equipment Recommendations  Rolling walker with 5" wheels;Other (comment) (TBA)    Recommendations for Other Services       Precautions / Restrictions Precautions Precautions: Fall Restrictions Weight Bearing Restrictions: No    Mobility  Bed Mobility Overal bed mobility: Needs Assistance Bed Mobility: Supine to Sit;Sit to Supine     Supine to sit: Mod assist Sit to supine: Min assist   General bed mobility comments: HOB at 40*; pt mostly pulling up on assist today.  Transfers Overall transfer level: Needs assistance Equipment used: 1 person hand held assist Transfers: Sit to/from Stand Sit to Stand: Min assist         General transfer comment: pt pushing and pulling to get up from raised surface.  More assist for lower surfaces.  Ambulation/Gait Ambulation/Gait assistance: Min assist Ambulation Distance (Feet): 15 Feet (x2 to/from bathroom)   Gait Pattern/deviations: Step-through  pattern     General Gait Details: mildly unsteady due to pain   Stairs            Wheelchair Mobility    Modified Rankin (Stroke Patients Only)       Balance Overall balance assessment: Needs assistance Sitting-balance support: No upper extremity supported Sitting balance-Leahy Scale: Fair Sitting balance - Comments: too much movement produces lot of pain     Standing balance-Leahy Scale: Fair                      Cognition Arousal/Alertness: Awake/alert Behavior During Therapy: WFL for tasks assessed/performed Overall Cognitive Status: Within Functional Limits for tasks assessed                      Exercises      General Comments General comments (skin integrity, edema, etc.): sats low 90's on 4L  Tees Toh      Pertinent Vitals/Pain Pain Assessment: 0-10 Pain Score: 8  Pain Location: left upper flank Pain Descriptors / Indicators: Spasm Pain Intervention(s): Monitored during session    Home Living Family/patient expects to be discharged to:: Private residence Living Arrangements: Alone   Type of Home: House Home Access: Stairs to enter Entrance Stairs-Rails: Right;Left Home Layout: Two level;Able to live on main level with bedroom/bathroom;1/2 bath on main level        Prior Function Level of Independence: Independent          PT Goals (current goals can now be found in the care plan section) Acute Rehab PT Goals Patient Stated Goal:  Get back home PT Goal Formulation: With patient Time For Goal Achievement: 02/20/15 Potential to Achieve Goals: Good    Frequency  Min 3X/week    PT Plan      Co-evaluation             End of Session Equipment Utilized During Treatment: Oxygen Activity Tolerance: Patient tolerated treatment well;Patient limited by pain Patient left: in bed;with call bell/phone within reach;with nursing/sitter in room     Time: 1620-1700 PT Time Calculation (min) (ACUTE ONLY): 40 min  Charges:   $Gait Training: 8-22 mins $Therapeutic Activity: 8-22 mins                    G Codes:  Functional Assessment Tool Used: clinical judgement Functional Limitation: Mobility: Walking and moving around Mobility: Walking and Moving Around Current Status ([id number]): At least 20 percent but less than 40 percent impaired, limited or restricted Mobility: Walking and Moving Around Goal Status [id number]): At least 1 percent but less than 20 percent impaired, limited or restricted   Haliegh Khurana, Eliseo Gum 02/06/2015, 5:38 PM 02/06/2015  Calvert Bing, PT [phone]  (pager)

## 2015-02-06 NOTE — H&P (Signed)
[facility] History and Physical  DION PARROW NLG:[id number] DOB: [birth year] DOA: 02/06/2015  Referring physician: Jodi Mourning PCP: [name], MD   Chief Complaint: fall/worsening sob  HPI: [name] is a 69 y.o. female with past medical history that includes chronic respiratory failure on 2.5 L of oxygen at home related to COPD, afib, obesity, hypertension, anemia, diabetes, GERD, rheumatoid arthritis presents to the emergency department from home with the chief complaint of worsening shortness of breath after a mechanical fall. Initial evaluation reveals 3 fractured ribs on the left and acute on chronic respiratory failure.  Information is obtained from the patient. She reports she was sleeping on account due to family visiting she got up this morning to the restroom and she tripped falling backwards hitting the arm going back chair on her way to the floor. She denies hitting her head or loss of consciousness. She was unable to get up from the floor without assistance due to immediate pain she felt on the left side. EMS was called, she was provided with 5 mg of albuterol and was transported to the emergency department. She describes the pain as constant sharp located on the left side radiating to the back. Breathing and coughing makes the pain worse. He denies chest pain palpitations headache dizziness syncope or near-syncope. She does report that over the last 3 months her baseline respiratory status has gradually worsened. She frequently increases her oxygen to 3 L. She reports her PCP as recommended pulmonology consult but she has resisted. She has nebulizers at home but she does not use them. She denies any fever chills dysuria hematuria frequency or urgency. She denies any abdominal pain nausea vomiting diarrhea constipation. She denies any recent illness is.  Workup in the emergency department includes basic metabolic panel significant for chloride 89, CO2 35, creatinine 1.21,  complete blood count significant for WBC 13.7 hemoglobin 10.9, chest x-ray reveals fractures of the left seventh eighth and ninth ribs. No pneumothorax or effusion. By basilar atelectasis. The emergency department is hemodynamically stable afebrile with oxygen saturation level 89% on 2 L. Oxygen supplementation increased to 4 L and her oxygen saturation level increased to 93%.  She is provided with DuoNeb 2 and morphine in the emergency department.  Review of Systems:  10 point review of systems complete and all systems are negative except as indicated in history of present illness Past Medical History  Diagnosis Date  . Cerebral aneurysm   . Hypertension   . Atrial fibrillation (HCC)   . Arthritis   . Anemia   . Asthma   . Aneurysm (HCC)     x2  . Complication of anesthesia     Has woken up during surgery before  . Brain aneurysm     have a "giant aneurysm, Dr. Bedelia Person has stented and coiled in past x 4  . Shortness of breath   . Bronchitis     history of  . Sleep apnea     does not use cpap  . Diabetes mellitus     metformin  . Adrenal disorder, other     "adrenal Incidentaloma"  . Urinary tract infection     history of  . GERD (gastroesophageal reflux disease)     omeprazole  . Rheumatoid arthritis(714.0)     has had for approx. 40years, sees Dr. [name]  . Depression     due to loss of spouse 6 years ago  . COPD (chronic obstructive pulmonary disease) (HCC)  Dr. [name], uses 2.5L of o2 as needed  . Emphysema of lung (HCC)     requested anethesia consult   . Obesity    Past Surgical History  Procedure Laterality Date  . Tubal ligation    . Hand surgery      bilateral  . Hand surgery      x 2  . Brain surgery      coiling and stenting  . Mastectomy, partial  02/28/2011    Procedure: MASTECTOMY PARTIAL;  Surgeon: Ernestene Mention, MD;  Location: [facility] OR;  Service: General;  Laterality: Right;  RIGHT PARTIAL MASTECTOMY  WITH NEEDLE LOCALIZATION  . Breast  surgery  02/28/11    right partial mastectomy    Social History:  reports that she has quit smoking. Her smoking use included Cigarettes. She has a 60 pack-year smoking history. She has never used smokeless tobacco. She reports that she drinks about 0.6 oz of alcohol per week. She reports that she does not use illicit drugs. She is a retired Therapist, sports. She lives at home alone she does have family in the area. She is fairly independent with ADLs but obviously struggling. Allergies  Allergen Reactions  . Gold-Containing Drug Products Other (See Comments)    Blisters   . Tape Dermatitis    Family History  Problem Relation Age of Onset  . Heart disease Mother   . Cancer Father     lung  . Cancer Sister     breast     Prior to Admission medications   Medication Sig Start Date End Date Taking? Authorizing Provider  albuterol (PROAIR HFA) 108 (90 BASE) MCG/ACT inhaler Inhale 2 puffs into the lungs every 6 (six) hours as needed. For shortness of breath   Yes Historical Provider, MD  atenolol (TENORMIN) 25 MG tablet Take 25 mg by mouth daily. 12/01/14  Yes Historical Provider, MD  budesonide-formoterol (SYMBICORT) 80-4.5 MCG/ACT inhaler Inhale 2 puffs into the lungs 2 (two) times daily.   Yes Historical Provider, MD  ibandronate (BONIVA) 150 MG tablet Take 150 mg by mouth every 30 (thirty) days. Take in the morning with a full glass of water, on an empty stomach, and do not take anything else by mouth or lie down for the next 30 min.   Yes Historical Provider, MD  metFORMIN (GLUCOPHAGE) 500 MG tablet Take 500 mg by mouth 2 (two) times daily with a meal.     Yes Historical Provider, MD  predniSONE (DELTASONE) 10 MG tablet Take 10 mg by mouth daily. 12/14/14  Yes Historical Provider, MD  tiotropium (SPIRIVA) 18 MCG inhalation capsule Place 18 mcg into inhaler and inhale daily.    Yes Historical Provider, MD  traZODone (DESYREL) 50 MG tablet Take 50 mg by mouth at bedtime.   Yes Historical  Provider, MD  bisoprolol (ZEBETA) 5 MG tablet Take 5 mg by mouth daily.      Historical Provider, MD  Hydrocodone-Acetaminophen (VICODIN PO) Take 0.5 tablets by mouth every 4 (four) hours as needed. For pain    Historical Provider, MD  omeprazole (PRILOSEC) 20 MG capsule Take 20 mg by mouth daily.      Historical Provider, MD  predniSONE (DELTASONE) 5 MG tablet Take 5 mg by mouth daily.      Historical Provider, MD  spironolactone-hydrochlorothiazide (ALDACTAZIDE) 25-25 MG per tablet Take 1 tablet by mouth daily.      Historical Provider, MD  zolpidem (AMBIEN) 10 MG tablet Take 10 mg by mouth at  bedtime as needed. For sleep    Historical Provider, MD   Physical Exam: Filed Vitals:   02/06/15 0746 02/06/15 0800 02/06/15 0845 02/06/15 1015  BP: 147/49  130/54 119/66  Pulse: 93 97 94 95  Temp: 97.5 F (36.4 C)     TempSrc: Oral     Resp: 22 22 24 20   SpO2: 93% 96% 91% 95%    Wt Readings from Last 3 Encounters:  06/03/11 90.719 kg (200 lb)  03/24/11 92.534 kg (204 lb)  02/28/11 92.4 kg (203 lb 11.3 oz)    General:  Appears somewhat anxious and slightly uncomfortable, obese Eyes: PERRL, normal lids, irises & conjunctiva ENT: grossly normal hearing, mucous membranes of her mouth are pink slightly dry Neck: no LAD, masses or thyromegaly Cardiovascular: RRR, no m/r/g. No LE edema. Respiratory: Moderate increased work of breathing with conversation and with movement. Breath sounds diminished somewhat throughout. I hear fair movement faint expiratory wheeze no crackles. Abdomen: Obese soft positive bowel sounds nontender to palpation no mass organomegaly noted Skin: no rash or induration seen on limited exam Musculoskeletal: grossly normal tone BUE/BLE Psychiatric: grossly normal mood and affect, speech fluent and appropriate Neurologic: grossly non-focal. Speech clear facial symmetry           Labs on Admission:  Basic Metabolic Panel:  Recent Labs Lab 02/06/15 0800  NA 136  K  4.6  CL 89*  CO2 35*  GLUCOSE 137*  BUN 25*  CREATININE 1.21*  CALCIUM 8.9   Liver Function Tests: No results for input(s): AST, ALT, ALKPHOS, BILITOT, PROT, ALBUMIN in the last 168 hours. No results for input(s): LIPASE, AMYLASE in the last 168 hours. No results for input(s): AMMONIA in the last 168 hours. CBC:  Recent Labs Lab 02/06/15 0800  WBC 13.7*  HGB 10.9*  HCT 36.7  MCV 89.1  PLT 365   Cardiac Enzymes: No results for input(s): CKTOTAL, CKMB, CKMBINDEX, TROPONINI in the last 168 hours.  BNP (last 3 results) No results for input(s): BNP in the last 8760 hours.  ProBNP (last 3 results) No results for input(s): PROBNP in the last 8760 hours.  CBG: No results for input(s): GLUCAP in the last 168 hours.  Radiological Exams on Admission: Dg Ribs Unilateral W/chest Left  02/06/2015  CLINICAL DATA:  Fall today.  Left chest pain EXAM: LEFT RIBS AND CHEST - 3+ VIEW COMPARISON:  04/11/2014 FINDINGS: Slightly displaced fracture left eighth rib posterior laterally which appears acute. Fractures of the left seventh and ninth ribs also noted. No pneumothorax or effusion Mild bibasilar atelectasis.  Apical pleural scarring bilaterally IMPRESSION: Fractures left seventh, eighth, and ninth ribs. No pneumothorax or effusion. Bibasilar atelectasis. Electronically Signed   By: Marlan Palau M.D.   On: 02/06/2015 08:26    EKG: Independently reviewed sinus rhythm  Assessment/Plan Principal Problem:   Acute on chronic respiratory failure after trauma (HCC) Active Problems:   Diabetes (HCC)   COPD (chronic obstructive pulmonary disease) (HCC)   Rheumatoid arthritis (HCC)   Fracture, ribs   Acute kidney injury (HCC)   Anemia   Hypertension   #1. Acute on chronic respiratory failure secondary worsening respiratory effort related to  fractured ribs from mechanical fall in the setting of COPD. Will admit to MedSurg for observation. Currently requiring more oxygen than per her  usual. Will provide scheduled nebulizers, Solu-Medrol, antibiotics flutter valve. Continue home Symbicort. Continue IS that was initiated in ED. Mobilize. She has indicated that her COPD is gradually been worsening  over the last 3 months and she has required 3 L versus the 2.5 documented.  #2. Fractured ribs secondary to mechanical fall. Pain management as noted above. Physical therapy consult.  #3. Acute kidney injury. Creatinine mildly elevated. Denies history of same. Gently hydrate. Hold any nephrotoxins for now. Monitor urine output. If no improvement consider renal ultrasound.  #4 diabetes. Will hold her oral medications for now. Will use sliding scale insulin for optimal control. Anticipate trending upward secondary to steroids. Of note she is on 10 mg of prednisone at home daily. Will obtain a hemoglobin A1c  #5. COPD: Former smoker. See #1. She reports gradual worsening over the last 3 months. Home medications include albuterol inhaler, Symbicort 10 mg of prednisone daily,spiriva. Will continue Symbicort.  #6. Anemia. Likely related to chronic disease. Current hemoglobin appears close to baseline. Will continue her home iron supplement.  #7. RA: stable at baseline. Continue home meds.   #8. HTN: controlled. Will hold lasix. Continue home meds. Monitor     Code Status: full DVT Prophylaxis: Family Communication: none present Disposition Plan: home   Time spent: 65 minute  [facility] M [facility]

## 2015-02-07 DIAGNOSIS — M069 Rheumatoid arthritis, unspecified: Secondary | ICD-10-CM | POA: Diagnosis present

## 2015-02-07 DIAGNOSIS — N179 Acute kidney failure, unspecified: Secondary | ICD-10-CM

## 2015-02-07 DIAGNOSIS — S2242XA Multiple fractures of ribs, left side, initial encounter for closed fracture: Secondary | ICD-10-CM | POA: Diagnosis present

## 2015-02-07 DIAGNOSIS — E1165 Type 2 diabetes mellitus with hyperglycemia: Secondary | ICD-10-CM | POA: Diagnosis present

## 2015-02-07 DIAGNOSIS — Z8673 Personal history of transient ischemic attack (TIA), and cerebral infarction without residual deficits: Secondary | ICD-10-CM | POA: Diagnosis not present

## 2015-02-07 DIAGNOSIS — E669 Obesity, unspecified: Secondary | ICD-10-CM | POA: Diagnosis present

## 2015-02-07 DIAGNOSIS — F329 Major depressive disorder, single episode, unspecified: Secondary | ICD-10-CM | POA: Diagnosis present

## 2015-02-07 DIAGNOSIS — S2239XA Fracture of one rib, unspecified side, initial encounter for closed fracture: Secondary | ICD-10-CM

## 2015-02-07 DIAGNOSIS — J441 Chronic obstructive pulmonary disease with (acute) exacerbation: Secondary | ICD-10-CM | POA: Diagnosis not present

## 2015-02-07 DIAGNOSIS — J9621 Acute and chronic respiratory failure with hypoxia: Secondary | ICD-10-CM | POA: Diagnosis not present

## 2015-02-07 DIAGNOSIS — Z9981 Dependence on supplemental oxygen: Secondary | ICD-10-CM | POA: Diagnosis not present

## 2015-02-07 DIAGNOSIS — W010XXA Fall on same level from slipping, tripping and stumbling without subsequent striking against object, initial encounter: Secondary | ICD-10-CM | POA: Diagnosis present

## 2015-02-07 DIAGNOSIS — J9620 Acute and chronic respiratory failure, unspecified whether with hypoxia or hypercapnia: Secondary | ICD-10-CM

## 2015-02-07 DIAGNOSIS — D649 Anemia, unspecified: Secondary | ICD-10-CM | POA: Diagnosis not present

## 2015-02-07 DIAGNOSIS — J45909 Unspecified asthma, uncomplicated: Secondary | ICD-10-CM | POA: Diagnosis present

## 2015-02-07 DIAGNOSIS — Z6841 Body Mass Index (BMI) 40.0 and over, adult: Secondary | ICD-10-CM | POA: Diagnosis not present

## 2015-02-07 DIAGNOSIS — I4891 Unspecified atrial fibrillation: Secondary | ICD-10-CM | POA: Diagnosis present

## 2015-02-07 DIAGNOSIS — Y92009 Unspecified place in unspecified non-institutional (private) residence as the place of occurrence of the external cause: Secondary | ICD-10-CM | POA: Diagnosis not present

## 2015-02-07 DIAGNOSIS — Z79899 Other long term (current) drug therapy: Secondary | ICD-10-CM | POA: Diagnosis not present

## 2015-02-07 DIAGNOSIS — E118 Type 2 diabetes mellitus with unspecified complications: Secondary | ICD-10-CM

## 2015-02-07 DIAGNOSIS — K219 Gastro-esophageal reflux disease without esophagitis: Secondary | ICD-10-CM | POA: Diagnosis present

## 2015-02-07 DIAGNOSIS — R0602 Shortness of breath: Secondary | ICD-10-CM | POA: Diagnosis present

## 2015-02-07 DIAGNOSIS — Z87891 Personal history of nicotine dependence: Secondary | ICD-10-CM | POA: Diagnosis not present

## 2015-02-07 DIAGNOSIS — I10 Essential (primary) hypertension: Secondary | ICD-10-CM | POA: Diagnosis present

## 2015-02-07 DIAGNOSIS — G4730 Sleep apnea, unspecified: Secondary | ICD-10-CM | POA: Diagnosis present

## 2015-02-07 LAB — BASIC METABOLIC PANEL
ANION GAP: 11 (ref 5–15)
BUN: 20 mg/dL (ref 6–20)
CALCIUM: 9.1 mg/dL (ref 8.9–10.3)
CO2: 37 mmol/L — ABNORMAL HIGH (ref 22–32)
CREATININE: 1.11 mg/dL — AB (ref 0.44–1.00)
Chloride: 85 mmol/L — ABNORMAL LOW (ref 101–111)
GFR, EST AFRICAN AMERICAN: 57 mL/min — AB (ref >60)
GFR, EST NON AFRICAN AMERICAN: 49 mL/min — AB (ref >60)
Glucose, Bld: 172 mg/dL — ABNORMAL HIGH (ref 65–99)
Potassium: 5.3 mmol/L — ABNORMAL HIGH (ref 3.5–5.1)
SODIUM: 133 mmol/L — AB (ref 135–145)

## 2015-02-07 LAB — HEMOGLOBIN A1C
HEMOGLOBIN A1C: 7.3 % — AB (ref 4.8–5.6)
Mean Plasma Glucose: 163 mg/dL

## 2015-02-07 LAB — GLUCOSE, CAPILLARY
GLUCOSE-CAPILLARY: 158 mg/dL — AB (ref 65–99)
GLUCOSE-CAPILLARY: 212 mg/dL — AB (ref 65–99)
GLUCOSE-CAPILLARY: 192 mg/dL — AB (ref 65–99)
GLUCOSE-CAPILLARY: 224 mg/dL — AB (ref 65–99)

## 2015-02-07 LAB — CBC
HCT: 34.9 % — ABNORMAL LOW (ref 36.0–46.0)
Hemoglobin: 10.5 g/dL — ABNORMAL LOW (ref 12.0–15.0)
MCH: 26.4 pg (ref 26.0–34.0)
MCHC: 30.1 g/dL (ref 30.0–36.0)
MCV: 87.9 fL (ref 78.0–100.0)
PLATELETS: 334 10*3/uL (ref 150–400)
RBC: 3.97 MIL/uL (ref 3.87–5.11)
RDW: 15.1 % (ref 11.5–15.5)
WBC: 11.1 10*3/uL — AB (ref 4.0–10.5)

## 2015-02-07 LAB — BRAIN NATRIURETIC PEPTIDE: B NATRIURETIC PEPTIDE 5: 132.1 pg/mL — AB (ref 0.0–100.0)

## 2015-02-07 MED ORDER — LORAZEPAM 0.5 MG PO TABS: 0.5000 mg | ORAL_TABLET | Freq: Three times a day (TID) | ORAL | Status: DC | PRN

## 2015-02-07 MED ORDER — ENOXAPARIN SODIUM 60 MG/0.6ML ~~LOC~~ SOLN
60.0000 mg | SUBCUTANEOUS | Status: DC
  Administered 2015-02-07 – 2015-02-08 (×2): 60 mg via SUBCUTANEOUS
  Filled 2015-02-07 (×2): qty 0.6

## 2015-02-07 MED ORDER — FUROSEMIDE 20 MG PO TABS
20.0000 mg | ORAL_TABLET | Freq: Every day | ORAL | Status: DC
  Administered 2015-02-07 – 2015-02-09 (×3): 20 mg via ORAL
  Filled 2015-02-07 (×3): qty 1

## 2015-02-07 MED ORDER — IPRATROPIUM-ALBUTEROL 0.5-2.5 (3) MG/3ML IN SOLN
3.0000 mL | RESPIRATORY_TRACT | Status: DC
  Administered 2015-02-07 (×3): 3 mL via RESPIRATORY_TRACT
  Filled 2015-02-07 (×3): qty 3

## 2015-02-07 MED ORDER — IPRATROPIUM-ALBUTEROL 0.5-2.5 (3) MG/3ML IN SOLN
3.0000 mL | Freq: Four times a day (QID) | RESPIRATORY_TRACT | Status: DC
  Filled 2015-02-07: qty 3

## 2015-02-07 MED ORDER — SODIUM POLYSTYRENE SULFONATE 15 GM/60ML PO SUSP
30.0000 g | Freq: Once | ORAL | Status: AC
  Administered 2015-02-07: 30 g via ORAL
  Filled 2015-02-07: qty 120

## 2015-02-07 MED ORDER — OXYCODONE HCL 5 MG PO TABS
5.0000 mg | ORAL_TABLET | ORAL | Status: DC | PRN
  Administered 2015-02-07 (×2): 5 mg via ORAL
  Administered 2015-02-08 – 2015-02-09 (×2): 10 mg via ORAL
  Filled 2015-02-07: qty 1
  Filled 2015-02-07: qty 2
  Filled 2015-02-07: qty 1
  Filled 2015-02-07: qty 2

## 2015-02-07 MED ORDER — ACETAMINOPHEN 500 MG PO TABS
500.0000 mg | ORAL_TABLET | Freq: Three times a day (TID) | ORAL | Status: DC
  Administered 2015-02-07 – 2015-02-09 (×7): 500 mg via ORAL
  Filled 2015-02-07 (×7): qty 1

## 2015-02-07 NOTE — Progress Notes (Signed)
[facility]                                                                              Patient Demographics  [name], is a 69 y.o. female, DOB - 10-31-1945, IFO:[id number]  Admit date - 02/06/2015   Admitting Physician [name], MD  Outpatient Primary MD for the patient is [name], MD  LOS -    Chief Complaint  Patient presents with  . Fall  . COPD       Brief HPI   Per Dr. [name] on 02/06/15 [name] is a 69 y.o. female with chronic respiratory failure on 2.5 L of oxygen at home related to COPD, afib, obesity, hypertension, anemia, diabetes, GERD, rheumatoid arthritis presented to ED from home with worsening shortness of breath after a mechanical fall.  Patient reported that she was sleeping on the couch due to her sister/family visiting, she got up on the morning of admission at 3 AM to the restroom and she tripped falling backwards hitting the arm going back chair on her way to the floor. She denied hitting her head or loss of consciousness. She was unable to get up from the floor without assistance due to immediate pain she felt on the left side. EMS was called, she was provided with 5 mg of albuterol and was transported to the emergency department. She described the pain as pleuritic, constant sharp located on the left side radiating to the back. She denied any dizziness, syncopal episode. She does report that over the last 3 months her baseline respiratory status has gradually worsened. She frequently increases her oxygen to 3 L. She has nebulizers at home but she does not use them.  Workup in ED, BMET significant for chloride 89, CO2 35, creatinine 1.21, complete blood count significant for WBC 13.7 hemoglobin 10.9, chest x-ray revealed fractures of the left seventh eighth and ninth ribs. No pneumothorax or effusion. By basilar atelectasis. The emergency department is hemodynamically stable afebrile with oxygen saturation level 89% on 2 L.  Oxygen supplementation increased to 4 L and her oxygen saturation level increased to 93%.    Assessment & Plan    Principal Problem: Acute on chronic respiratory failure : Due to COPD exacerbation and poor respiratory effort related to fractured ribs from mechanical fall -  Continue O2 via nasal cannula, incentive spirometry, continue Symbicort, DuoNeb nebs, Solu-Medrol.  - Continue pain control.  - 2-D echo also ordered to rule out any underlying CHF, patient reported leg swelling and recent weight gain, was not able to quantify   Active problems  Fractured ribs secondary to mechanical fall.  - Rib series showed fractures of the left seventh, eighth, ninth ribs, no pneumothorax or effusion - Pain management as noted above. Physical therapy consult.   Acute kidney injury. -  Creatinine mildly elevated, improved with gentle hydration.  Diabetes mellitus, type II, uncontrolled - Continue to hold oral hypoglycemics  - Of note, she is on 10 mg of prednisone at home daily. Will obtain a hemoglobin A1c - Placed on sliding scale insulin   COPD with chronic respiratory failure -  will  continue Symbicort, Solu-Medrol, Spiriva. She is on 10 mg of prednisone daily at home.    Anemia. Likely related to chronic disease.  - Current hemoglobin appears close to baseline, continue her home iron supplement.   RA: stable at baseline.  - Continue prednisone    HTN: controlled.  - Continue Lasix   Code Status: Full code  Family Communication: Discussed in detail with the patient, all imaging results, lab results explained to the patient    Disposition Plan: Hopefully DC next 24-48 hours if improving  Time Spent in minutes  25 minutes  Procedures  CXR  Consults   None   DVT Prophylaxis   Lovenox   Medications  Scheduled Meds: . acetaminophen  500 mg Oral TID  . atenolol  25 mg Oral Daily  . budesonide-formoterol  2 puff Inhalation BID  . enoxaparin (LOVENOX) injection  60 mg  Subcutaneous Q24H  . insulin aspart  0-15 Units Subcutaneous TID WC  . insulin aspart  0-5 Units Subcutaneous QHS  . ipratropium-albuterol  3 mL Nebulization Q4H  . levofloxacin (LEVAQUIN) IV  750 mg Intravenous Q24H  . methylPREDNISolone (SOLU-MEDROL) injection  40 mg Intravenous Q6H  . pantoprazole  40 mg Oral Daily  . sodium chloride  3 mL Intravenous Q12H  . traZODone  50 mg Oral QHS   Continuous Infusions:  PRN Meds:.sodium chloride, acetaminophen **OR** acetaminophen, albuterol, alum & mag hydroxide-simeth, guaiFENesin-dextromethorphan, LORazepam, morphine injection, ondansetron **OR** ondansetron (ZOFRAN) IV, oxyCODONE, polyethylene glycol, sodium chloride   Antibiotics   Anti-infectives    Start     Dose/Rate Route Frequency Ordered Stop   02/06/15 1115  levofloxacin (LEVAQUIN) IVPB 750 mg     750 mg 100 mL/hr over 90 Minutes Intravenous Every 24 hours 02/06/15 1109          Subjective:   [name] was seen and examined today.  Very overwhelmed with her situation, mechanical fall and rib fractures. Continues to feel pain 7/10, in left rib area. Patient denies dizziness, abdominal pain, N/V/D/C, new weakness, numbess, tingling. No acute events overnight.    Objective:   Blood pressure 137/52, pulse 63, temperature 98.4 F (36.9 C), temperature source Oral, resp. rate 16, height 5\' 7"  (1.702 m), weight 117.6 kg (259 lb 4.2 oz), SpO2 90 %.  Wt Readings from Last 3 Encounters:  02/06/15 117.6 kg (259 lb 4.2 oz)  06/03/11 90.719 kg (200 lb)  03/24/11 92.534 kg (204 lb)     Intake/Output Summary (Last 24 hours) at 02/07/15 1257 Last data filed at 02/07/15 0900  Gross per 24 hour  Intake    606 ml  Output    950 ml  Net   -344 ml    Exam  General: Alert and oriented x 3, NAD, very anxious  HEENT:  PERRLA, EOMI, Anicteric Sclera, mucous membranes moist.   Neck: Supple, no JVD, no masses  CVS: S1 S2 auscultated, no rubs, murmurs or gallops. Regular rate and  rhythm.  Respiratory: Mild scattered wheezing bilaterally, tender in the left chest wall  Abdomen: Soft, nontender, nondistended, + bowel sounds  Ext: no cyanosis clubbing or edema  Neuro: AAOx3, Cr N's II- XII. Strength 5/5 upper and lower extremities bilaterally  Skin: No rashes  Psych: Normal affect and demeanor, alert and oriented x3    Data Review   Micro Results Recent Results (from the past 240 hour(s))  MRSA PCR Screening     Status: None   Collection Time: 02/06/15  2:33 PM  Result Value Ref Range Status   MRSA by PCR NEGATIVE NEGATIVE Final    Comment:        The GeneXpert MRSA Assay (FDA approved for NASAL specimens only), is one component of a comprehensive MRSA colonization surveillance program. It is not intended to diagnose MRSA infection nor to guide or monitor treatment for MRSA infections.     Radiology Reports Dg Ribs Unilateral W/chest Left  02/06/2015  CLINICAL DATA:  Fall today.  Left chest pain EXAM: LEFT RIBS AND CHEST - 3+ VIEW COMPARISON:  04/11/2014 FINDINGS: Slightly displaced fracture left eighth rib posterior laterally which appears acute. Fractures of the left seventh and ninth ribs also noted. No pneumothorax or effusion Mild bibasilar atelectasis.  Apical pleural scarring bilaterally IMPRESSION: Fractures left seventh, eighth, and ninth ribs. No pneumothorax or effusion. Bibasilar atelectasis. Electronically Signed   By: Marlan Palau M.D.   On: 02/06/2015 08:26    CBC  Recent Labs Lab 02/06/15 0800 02/07/15 0638  WBC 13.7* 11.1*  HGB 10.9* 10.5*  HCT 36.7 34.9*  PLT 365 334  MCV 89.1 87.9  MCH 26.5 26.4  MCHC 29.7* 30.1  RDW 15.4 15.1    Chemistries   Recent Labs Lab 02/06/15 0800 02/07/15 0638  NA 136 133*  K 4.6 5.3*  CL 89* 85*  CO2 35* 37*  GLUCOSE 137* 172*  BUN 25* 20  CREATININE 1.21* 1.11*  CALCIUM 8.9 9.1    ------------------------------------------------------------------------------------------------------------------ estimated creatinine clearance is 63.4 mL/min (by C-G formula based on Cr of 1.11). ------------------------------------------------------------------------------------------------------------------  Recent Labs  02/06/15 0800  HGBA1C 7.3*   ------------------------------------------------------------------------------------------------------------------ No results for input(s): CHOL, HDL, LDLCALC, TRIG, CHOLHDL, LDLDIRECT in the last 72 hours. ------------------------------------------------------------------------------------------------------------------ No results for input(s): TSH, T4TOTAL, T3FREE, THYROIDAB in the last 72 hours.  Invalid input(s): FREET3 ------------------------------------------------------------------------------------------------------------------ No results for input(s): VITAMINB12, FOLATE, FERRITIN, TIBC, IRON, RETICCTPCT in the last 72 hours.  Coagulation profile No results for input(s): INR, PROTIME in the last 168 hours.  No results for input(s): DDIMER in the last 72 hours.  Cardiac Enzymes No results for input(s): CKMB, TROPONINI, MYOGLOBIN in the last 168 hours.  Invalid input(s): CK ------------------------------------------------------------------------------------------------------------------ Invalid input(s): POCBNP   Recent Labs  02/06/15 1326 02/06/15 1704 02/06/15 2144 02/07/15 0737 02/07/15 1130  GLUCAP 192* 186* 186* 158* 212*     RAI,RIPUDEEP M.D. [facility] 02/07/2015, 12:57 PM  Pager: [phone] Between 7am to 7pm - call Pager - [phone]  After 7pm go to [email or web address] - password [email or web address]  Call night coverage person covering after 7pm

## 2015-02-07 NOTE — NC FL2 (Signed)
[place] MEDICAID FL2 LEVEL OF CARE SCREENING TOOL     IDENTIFICATION  Patient Name: [name] Birthdate: 01-21-1946 Sex: female Admission Date (Current Location): 02/06/2015  [facility] and [place] Number:    Facility and Address:  [facility] [address] [place] Provider Number: [phone]   Attending Physician Name and Address:  Cathren Harsh, MD  Relative Name and Address:  Nelva Bush    Current Level of Care: Hospital Recommended Level of Care: Nursing Facility Prior Approval Number:    Date Approved/Denied:   PASRR Number:   [id number]  Discharge Plan: SNF    Current Diagnoses: Patient Active Problem List   Diagnosis Date Noted  . Acute on chronic respiratory failure after trauma (HCC) 02/06/2015  . Fracture, ribs 02/06/2015  . Acute kidney injury (HCC) 02/06/2015  . Acute on chronic respiratory failure (HCC) 02/06/2015  . Obesity   . Anemia   . Hypertension   . Calcification of right breast 02/28/2011  . Family history of breast cancer 02/10/2011  . Diabetes (HCC) 04/01/2007  . MITRAL VALVE PROLAPSE 04/01/2007  . EMPHYSEMA 04/01/2007  . ASTHMA 04/01/2007  . COPD (chronic obstructive pulmonary disease) (HCC) 04/01/2007  . POSTMENOPAUSAL SYNDROME 04/01/2007  . Rheumatoid arthritis (HCC) 04/01/2007  . VERTIGO 04/01/2007  . SLEEP APNEA 04/01/2007  . PALPITATIONS, HX OF 04/01/2007    DISORIENTED AMBULATORY STATUS BLADDER BOWEL    Semi-Ambulatory Continent Continent  INAPPROPRIATE BEHAVIOR FUNCTIONAL LIMITATIONS COMMUNICATION OF NEEDS RESPIRATION      Verbally O2 (Cont.)  PERSONAL CARE ASSISTANCE ACTIVITIES/SOCIAL SKIN NUTRITION STATUS  Bathing, Dressing Active Normal Diet (Heart Health Carb Modified)  PHYSICIAN VISITS NEUROLOGICAL            SPECIAL CARE FACTORS FREQUENCY  PT (By licensed PT)     PT Frequency: 5/wk             Current Medications (02/07/2015): Current Facility-Administered Medications  Medication Dose Route  Frequency Provider Last Rate Last Dose  . 0.9 %  sodium chloride infusion  250 mL Intravenous PRN Gwenyth Bender, NP      . acetaminophen (TYLENOL) tablet 650 mg  650 mg Oral Q6H PRN Gwenyth Bender, NP       Or  . acetaminophen (TYLENOL) suppository 650 mg  650 mg Rectal Q6H PRN Gwenyth Bender, NP      . acetaminophen (TYLENOL) tablet 500 mg  500 mg Oral TID Ripudeep Jenna Luo, MD   500 mg at 02/07/15 1114  . albuterol (PROVENTIL) (2.5 MG/3ML) 0.083% nebulizer solution 2.5 mg  2.5 mg Nebulization Q4H PRN Gwenyth Bender, NP      . alum & mag hydroxide-simeth (MAALOX/MYLANTA) 200-200-20 MG/5ML suspension 30 mL  30 mL Oral Q6H PRN Gwenyth Bender, NP      . atenolol (TENORMIN) tablet 25 mg  25 mg Oral Daily Lesle Chris Black, NP   25 mg at 02/07/15 1002  . budesonide-formoterol (SYMBICORT) 80-4.5 MCG/ACT inhaler 2 puff  2 puff Inhalation BID Gwenyth Bender, NP   2 puff at 02/07/15 [id number]  . enoxaparin (LOVENOX) injection 60 mg  60 mg Subcutaneous Q24H Scarlett Presto, RPH      . furosemide (LASIX) tablet 20 mg  20 mg Oral Daily Ripudeep K Rai, MD      . guaiFENesin-dextromethorphan (ROBITUSSIN DM) 100-10 MG/5ML syrup 5 mL  5 mL Oral Q4H PRN Lesle Chris Black, NP      . insulin aspart (novoLOG) injection 0-15 Units  0-15 Units Subcutaneous TID WC Gwenyth Bender, NP   5 Units at 02/07/15 1244  . insulin aspart (novoLOG) injection 0-5 Units  0-5 Units Subcutaneous QHS Gwenyth Bender, NP   0 Units at 02/06/15 2208  . ipratropium-albuterol (DUONEB) 0.5-2.5 (3) MG/3ML nebulizer solution 3 mL  3 mL Nebulization Q4H Ripudeep K Rai, MD   3 mL at 02/07/15 1658  . levofloxacin (LEVAQUIN) IVPB 750 mg  750 mg Intravenous Q24H Lesle Chris Black, NP 100 mL/hr at 02/07/15 1244 750 mg at 02/07/15 1244  . LORazepam (ATIVAN) tablet 0.5 mg  0.5 mg Oral Q8H PRN Ripudeep K Rai, MD      . methylPREDNISolone sodium succinate (SOLU-MEDROL) 40 mg/mL injection 40 mg  40 mg Intravenous Q6H Gwenyth Bender, NP   40 mg at 02/07/15 1144  . morphine 2 MG/ML  injection 2 mg  2 mg Intravenous Q3H PRN Gwenyth Bender, NP   2 mg at 02/06/15 2126  . ondansetron (ZOFRAN) tablet 4 mg  4 mg Oral Q6H PRN Gwenyth Bender, NP       Or  . ondansetron [facility]) injection 4 mg  4 mg Intravenous Q6H PRN Gwenyth Bender, NP      . oxyCODONE (Oxy IR/ROXICODONE) immediate release tablet 5-10 mg  5-10 mg Oral Q4H PRN Ripudeep Jenna Luo, MD   5 mg at 02/07/15 1043  . pantoprazole (PROTONIX) EC tablet 40 mg  40 mg Oral Daily Lesle Chris Black, NP   40 mg at 02/07/15 1003  . polyethylene glycol (MIRALAX / GLYCOLAX) packet 17 g  17 g Oral Daily PRN Lesle Chris Black, NP      . sodium chloride 0.9 % injection 3 mL  3 mL Intravenous Q12H Lesle Chris Black, NP   3 mL at 02/07/15 1147  . sodium chloride 0.9 % injection 3 mL  3 mL Intravenous PRN Gwenyth Bender, NP      . traZODone (DESYREL) tablet 50 mg  50 mg Oral QHS [name], MD   50 mg at 02/06/15 2052   Do not use this list as official medication orders. Please verify with discharge summary.  Discharge Medications:   Medication List    ASK your doctor about these medications        aspirin EC 81 MG tablet  Take 81 mg by mouth daily.     atenolol 25 MG tablet  Commonly known as:  TENORMIN  Take 25 mg by mouth daily.     budesonide-formoterol 80-4.5 MCG/ACT inhaler  Commonly known as:  SYMBICORT  Inhale 2 puffs into the lungs 2 (two) times daily.     Calcium 600-200 MG-UNIT tablet  Take 1 tablet by mouth daily.     CENTRUM SILVER ADULT 50+ PO  Take 1 tablet by mouth daily.     ferrous sulfate 325 (65 FE) MG tablet  Take 325 mg by mouth daily with breakfast.     furosemide 20 MG tablet  Commonly known as:  LASIX  Take 20 mg by mouth daily.     ibandronate 150 MG tablet  Commonly known as:  BONIVA  Take 150 mg by mouth every 30 (thirty) days. Take in the morning with a full glass of water, on an empty stomach, and do not take anything else by mouth or lie down for the next 30 min.     metFORMIN 500 MG tablet   Commonly known as:  GLUCOPHAGE  Take 500 mg by  mouth 2 (two) times daily with a meal.     omeprazole 20 MG capsule  Commonly known as:  PRILOSEC  Take 20 mg by mouth daily.     OXYGEN  Inhale 2.5 L into the lungs continuous.     predniSONE 10 MG tablet  Commonly known as:  DELTASONE  Take 10 mg by mouth daily.     PROAIR HFA 108 (90 BASE) MCG/ACT inhaler  Generic drug:  albuterol  Inhale 2 puffs into the lungs every 6 (six) hours as needed. For shortness of breath     spironolactone-hydrochlorothiazide 25-25 MG tablet  Commonly known as:  ALDACTAZIDE  Take 1 tablet by mouth daily.     tiotropium 18 MCG inhalation capsule  Commonly known as:  SPIRIVA  Place 18 mcg into inhaler and inhale daily.     traZODone 50 MG tablet  Commonly known as:  DESYREL  Take 50 mg by mouth at bedtime.        Relevant Imaging Results:  Relevant Lab Results:  Recent Labs    Additional Information    Venita Lick, LCSW

## 2015-02-07 NOTE — Clinical Social Work Note (Signed)
Clinical Social Work Assessment  Patient Details  Name: [name] MRN: [id number] Date of Birth: [birth year]  Date of referral:  02/07/15               Reason for consult:  Facility Placement, Discharge Planning                Permission sought to share information with:  Family Supports, Chartered certified accountant granted to share information::  Yes, Verbal Permission Granted  Name::     Architect::  SNFs  Relationship::     Contact Information:     Housing/Transportation Living arrangements for the past 2 months:  Single Family Home Source of Information:  Patient Patient Interpreter Needed:  None Criminal Activity/Legal Involvement Pertinent to Current Situation/Hospitalization:  No - Comment as needed Significant Relationships:  Siblings Lives with:  Siblings Do you feel safe going back to the place where you live?  No Need for family participation in patient care:  No (Coment)  Care giving concerns:  Patient is concerned about returning home at this time given her increased need for assistance.   Social Worker assessment / plan:  CSW met with the patient at bedside to complete assessment. Patient states that she was admitted from home after experiencing a fall which broke 3 of her ribs. The patient states that she would like to be discharged to a SNF for continued rehab. CSW explained that the patient is currently observation status and explained the patient will need to be inpatient for 3 nights to qualify for SNF benefits through her Medicare. CSW explained that it was unclear at this time if the patient would require the necessary 3 nights. Per RNCM the patient will be switched to inpatient today. The patient was somewhat upset as she fell she should not have been admitted as an observation patient. The patient states she will not have the ability to pay privately or have the needed assistance at home. CSW was able to get 7 day LOG for the patient. CSW  explained LOG SNF search/placement process and answered the patient's questions. CSW will follow up with any available bed offers.   Employment status:  Retired Forensic scientist:  [employer] PT Recommendations:  [place] / Referral to community resources:  [place]  Patient/Family's Response to care:  The patient expresses frustration about being admitting as observation.  Patient/Family's Understanding of and Emotional Response to Diagnosis, Current Treatment, and Prognosis:  The patient has a good understanding of the reason for her admission, her diagnosis, and her post DC needs.   Emotional Assessment Appearance:  Appears older than stated age Attitude/Demeanor/Rapport:  Other (Patient was appropriate with CSW but upset about her observation status.) Affect (typically observed):  Irritable Orientation:  Oriented to Self, Oriented to Place, Oriented to  Time, Oriented to Situation Alcohol / Substance use:  Tobacco Use, Alcohol Use (Hx of tobacco) Psych involvement (Current and /or in the community):  No (Comment)  Discharge Needs  Concerns to be addressed:  Discharge Planning Concerns Readmission within the last 30 days:  No Current discharge risk:  Physical Impairment, Chronically ill Barriers to Discharge:  Continued Medical Work up   [employer] MSW, [place], [place], [phone]

## 2015-02-07 NOTE — Progress Notes (Signed)
Physical Therapy Treatment Patient Details Name: [name] MRN: [id number] DOB: 12-02-1945 Today's Date: 02/07/2015    History of Present Illness [name] is a 69 y.o. female with past medical history that includes chronic respiratory failure on 2.5 L of oxygen at home related to COPD, afib, obesity, hypertension, anemia, diabetes, rheumatoid arthritis presents to the emergency department from home with the chief complaint of worsening shortness of breath after a mechanical fall. Initial evaluation reveals 3 fractured ribs on the left and acute on chronic respiratory failure    PT Comments    Patient limited today by pain with movement, dyspnea and moments of not being able to breathe per pt report and decrease in Sp02 at rest and worsened with activity. Pt very anxious limiting mobility as well. Requires increased time for all mobility. Instructed pt in pursed lip breathing, energy conservation techniques, relaxation and visualization techniques however anxiety difficult to manage. RN present in room and placing pt on mask as pt mouth breather at baseline. Pt not safe to return home at this time. Discharge recommendation updated to ST SNF. Will follow acutely to maximize independence and mobility.   Follow Up Recommendations  SNF;Supervision/Assistance - 24 hour     Equipment Recommendations  Rolling walker with 5" wheels    Recommendations for Other Services       Precautions / Restrictions Precautions Precautions: Fall Precaution Comments: highly anxious; monitor 02 Restrictions Weight Bearing Restrictions: No    Mobility  Bed Mobility Overal bed mobility: Needs Assistance Bed Mobility: Supine to Sit     Supine to sit: Mod assist;HOB elevated     General bed mobility comments: HOB at 40*; pt mostly pulling up on assist today. "I can't, I can;'t, pull me up." Reports difficulty catching breath - seen wheezing and turning slightly red in face at times. Provided cues  for relaxation/breathing.  Transfers Overall transfer level: Needs assistance Equipment used: 2 person hand held assist Transfers: Sit to/from [employer] Sit to Stand: Min assist Stand pivot transfers: Min assist       General transfer comment: Assist to boost from EOB with pt adamant about having 2 people on either side of her; very anxious. Reports not being at to catch breath on multiple occasions sitting EOB. Min A SPT bed to [facility]. "Help me pull down my pants, help me!"  Ambulation/Gait Ambulation/Gait assistance:  (Deferred due to anxiety, reports of difficulty breathing and SP02)               Stairs            Wheelchair Mobility    Modified Rankin (Stroke Patients Only)       Balance Overall balance assessment: Needs assistance Sitting-balance support: Feet supported;No upper extremity supported Sitting balance-Leahy Scale: Fair Sitting balance - Comments: Any movement causes pain and SOB.   Standing balance support: During functional activity Standing balance-Leahy Scale: Poor Standing balance comment: Relient on Min A for balancr during transfer.                    Cognition Arousal/Alertness: Awake/alert Behavior During Therapy: Anxious Overall Cognitive Status: Within Functional Limits for tasks assessed                      Exercises      General Comments General comments (skin integrity, edema, etc.): Sp02 high 80s on 4L/min 02. Increased to 6L/min02 and Sp02 ranging from 87-93%. Asking for mask.  RN notified and putting one on at end of session. Pt mouth breather.      Pertinent Vitals/Pain Pain Assessment: Faces Faces Pain Scale: Hurts worst Pain Location: left ribs Pain Descriptors / Indicators: Spasm;Sore Pain Intervention(s): Monitored during session;Repositioned;RN gave pain meds during session;Limited activity within patient's tolerance    Home Living                      Prior Function             PT Goals (current goals can now be found in the care plan section) Progress towards PT goals: Not progressing toward goals - comment    Frequency  Min 3X/week    PT Plan Discharge plan needs to be updated    Co-evaluation             End of Session Equipment Utilized During Treatment: Oxygen;Gait belt Activity Tolerance: Treatment limited secondary to medical complications (Comment);Patient limited by pain (Dyspnea, Sp02) Patient left: with call bell/phone within reach;with nursing/sitter in room (sitting on [facility] with RN and nursing student in room.)     Time: 1010-1044 PT Time Calculation (min) (ACUTE ONLY): 34 min  Charges:  $Therapeutic Activity: 8-22 mins $Self Care/Home Management: 8-22                    G Codes:      Kie Calvin A Dorothymae Maciver 02/07/2015, 10:57 AM Mylo Red, PT, DPT [phone]

## 2015-02-07 NOTE — Care Management Note (Signed)
Case Management Note  Patient Details  Name: [name] MRN: [id number] Date of Birth: [birth year]  Subjective/Objective:    Date: 02/07/15 Spoke with patient at the bedside. Introduced self as Sports coach and explained role in discharge planning and how to be reached. Verified patient lives in town, alone has DME cane  And rolling walker and home oxygen with[facility],  nebulizer  Which is old but it still works. Expressed no  potential need for no other DME. Verified patient anticipates to go, SNF at time of discharge and will have no superviosn at home at this time to best of their knowledge. Patient  denied needing help with their medication. Patient states sister will have to take her to MD appointments at this time. Verified patient has PCP Dr. [name].   Plan: CM will continue to follow for discharge planning and [facility] resources.                 Action/Plan:   Expected Discharge Date:                  Expected Discharge Plan:  Skilled Nursing Facility  In-House Referral:  Clinical Social Work  Discharge planning Services  CM Consult  Post Acute Care Choice:    Choice offered to:     DME Arranged:    DME Agency:     HH Arranged:    HH Agency:     Status of Service:  Completed, signed off  Medicare Important Message Given:    Date Medicare IM Given:    Medicare IM give by:    Date Additional Medicare IM Given:    Additional Medicare Important Message give by:     If discussed at Long Length of Stay Meetings, dates discussed:    Additional Comments:  Leone Haven, RN 02/07/2015, 3:45 PM

## 2015-02-07 NOTE — Consult Note (Signed)
Roth Ress EdD 

## 2015-02-08 ENCOUNTER — Inpatient Hospital Stay (HOSPITAL_COMMUNITY): Payer: Medicare Other

## 2015-02-08 LAB — CBC
HEMATOCRIT: 33.6 % — AB (ref 36.0–46.0)
Hemoglobin: 10.1 g/dL — ABNORMAL LOW (ref 12.0–15.0)
MCH: 25.8 pg — AB (ref 26.0–34.0)
MCHC: 30.1 g/dL (ref 30.0–36.0)
MCV: 85.7 fL (ref 78.0–100.0)
Platelets: 374 10*3/uL (ref 150–400)
RBC: 3.92 MIL/uL (ref 3.87–5.11)
RDW: 15.2 % (ref 11.5–15.5)
WBC: 11.5 10*3/uL — AB (ref 4.0–10.5)

## 2015-02-08 LAB — GLUCOSE, CAPILLARY
GLUCOSE-CAPILLARY: 176 mg/dL — AB (ref 65–99)
GLUCOSE-CAPILLARY: 135 mg/dL — AB (ref 65–99)
Glucose-Capillary: 179 mg/dL — ABNORMAL HIGH (ref 65–99)
Glucose-Capillary: 208 mg/dL — ABNORMAL HIGH (ref 65–99)

## 2015-02-08 LAB — BASIC METABOLIC PANEL
ANION GAP: 10 (ref 5–15)
BUN: 22 mg/dL — ABNORMAL HIGH (ref 6–20)
CO2: 38 mmol/L — AB (ref 22–32)
Calcium: 8.9 mg/dL (ref 8.9–10.3)
Chloride: 81 mmol/L — ABNORMAL LOW (ref 101–111)
Creatinine, Ser: 1.1 mg/dL — ABNORMAL HIGH (ref 0.44–1.00)
GFR calc Af Amer: 58 mL/min — ABNORMAL LOW (ref >60)
GFR calc non Af Amer: 50 mL/min — ABNORMAL LOW (ref >60)
GLUCOSE: 205 mg/dL — AB (ref 65–99)
POTASSIUM: 4 mmol/L (ref 3.5–5.1)
Sodium: 129 mmol/L — ABNORMAL LOW (ref 135–145)

## 2015-02-08 MED ORDER — HYDROCOD POLST-CPM POLST ER 10-8 MG/5ML PO SUER
5.0000 mL | Freq: Two times a day (BID) | ORAL | Status: DC
  Administered 2015-02-08 – 2015-02-09 (×3): 5 mL via ORAL
  Filled 2015-02-08 (×3): qty 5

## 2015-02-08 MED ORDER — METOPROLOL TARTRATE 1 MG/ML IV SOLN
5.0000 mg | Freq: Once | INTRAVENOUS | Status: AC
  Administered 2015-02-08: 5 mg via INTRAVENOUS
  Filled 2015-02-08: qty 5

## 2015-02-08 MED ORDER — PREDNISONE 50 MG PO TABS: 60.0000 mg | ORAL_TABLET | Freq: Every day | ORAL | Status: AC

## 2015-02-08 MED ORDER — OXYCODONE HCL ER 10 MG PO T12A
10.0000 mg | EXTENDED_RELEASE_TABLET | Freq: Two times a day (BID) | ORAL | Status: DC
  Administered 2015-02-08 – 2015-02-09 (×3): 10 mg via ORAL
  Filled 2015-02-08 (×3): qty 1

## 2015-02-08 MED ORDER — LIDOCAINE 5 % EX PTCH
1.0000 | MEDICATED_PATCH | CUTANEOUS | Status: DC
  Administered 2015-02-08 – 2015-02-09 (×2): 1 via TRANSDERMAL
  Filled 2015-02-08 (×2): qty 1

## 2015-02-08 MED ORDER — TRAMADOL HCL 50 MG PO TABS
50.0000 mg | ORAL_TABLET | Freq: Four times a day (QID) | ORAL | Status: DC | PRN
  Filled 2015-02-08: qty 1

## 2015-02-08 MED ORDER — IPRATROPIUM-ALBUTEROL 0.5-2.5 (3) MG/3ML IN SOLN
3.0000 mL | Freq: Three times a day (TID) | RESPIRATORY_TRACT | Status: DC
  Administered 2015-02-08 – 2015-02-09 (×4): 3 mL via RESPIRATORY_TRACT
  Filled 2015-02-08 (×4): qty 3

## 2015-02-08 MED ORDER — PREDNISONE 20 MG PO TABS
40.0000 mg | ORAL_TABLET | Freq: Every day | ORAL | Status: DC
  Administered 2015-02-09: 40 mg via ORAL
  Filled 2015-02-08: qty 2

## 2015-02-08 MED ORDER — METHOCARBAMOL 750 MG PO TABS
750.0000 mg | ORAL_TABLET | Freq: Three times a day (TID) | ORAL | Status: DC
  Administered 2015-02-08 – 2015-02-09 (×4): 750 mg via ORAL
  Filled 2015-02-08 (×4): qty 1

## 2015-02-08 NOTE — Clinical Social Work Placement (Signed)
   CLINICAL SOCIAL WORK PLACEMENT  NOTE  Date:  02/08/2015  Patient Details  Name: [name] MRN: [id number] Date of Birth: 1945/12/15  Clinical Social Work is seeking post-discharge placement for this patient at the Skilled  Nursing Facility level of care (*CSW will initial, date and re-position this form in  chart as items are completed):  Yes   Patient/family provided with [place] Clinical Social Work Department's list of facilities offering this level of care within the geographic area requested by the patient (or if unable, by the patient's family).  Yes   Patient/family informed of their freedom to choose among providers that offer the needed level of care, that participate in Medicare, Medicaid or managed care program needed by the patient, have an available bed and are willing to accept the patient.  Yes   Patient/family informed of [place]'s ownership interest in [facility] and [facility], as well as of the fact that they are under no obligation to receive care at these facilities.  PASRR submitted to EDS on 02/08/15     PASRR number received on 02/08/15     Existing PASRR number confirmed on       FL2 transmitted to all facilities in geographic area requested by pt/family on 02/08/15     FL2 transmitted to all facilities within larger geographic area on       Patient informed that his/her managed care company has contracts with or will negotiate with certain facilities, including the following:            Patient/family informed of bed offers received.  Patient chooses bed at       Physician recommends and patient chooses bed at      Patient to be transferred to   on  .  Patient to be transferred to facility by       Patient family notified on   of transfer.  Name of family member notified:        PHYSICIAN       Additional Comment:  CSW has faxed patient's information out for a letter of guarantee bed as she is not currently meeting the  3 night qualifying stay for SNF benefits under her Medicare.   _______________________________________________ Roddie Mc MSW, LCSW, [place], [phone]

## 2015-02-08 NOTE — Progress Notes (Addendum)
Pt up to [facility] with x2 assist. Pt in severe pain at this time. Gave PRN pain medication. HR once pt was OOB has sustained in 140's-150's. Paged Dr Isidoro Donning. Waiting to hear back from dr. Scheduled Atenolol given also. VS taken.

## 2015-02-08 NOTE — Progress Notes (Signed)
Echocardiogram 2D Echocardiogram has been performed.  Dorothey Baseman 02/08/2015, 2:05 PM

## 2015-02-08 NOTE — Progress Notes (Addendum)
[facility]                                                                              Patient Demographics  [name], is a 69 y.o. female, DOB - 10-14-1945, IBB:[id number]  Admit date - 02/06/2015   Admitting Physician [name], MD  Outpatient Primary MD for the patient is [name], MD  LOS - 1   Chief Complaint  Patient presents with  . Fall  . COPD       Brief HPI   Per Dr. [name] on 02/06/15 [name] is a 69 y.o. female with chronic respiratory failure on 2.5 L of oxygen at home related to COPD, afib, obesity, hypertension, anemia, diabetes, GERD, rheumatoid arthritis presented to ED from home with worsening shortness of breath after a mechanical fall.  Patient reported that she was sleeping on the couch due to her sister/family visiting, she got up on the morning of admission at 3 AM to the restroom and she tripped falling backwards hitting the arm going back chair on her way to the floor. She denied hitting her head or loss of consciousness. She was unable to get up from the floor without assistance due to immediate pain she felt on the left side. EMS was called, she was provided with 5 mg of albuterol and was transported to the emergency department. She described the pain as pleuritic, constant sharp located on the left side radiating to the back. She denied any dizziness, syncopal episode. She does report that over the last 3 months her baseline respiratory status has gradually worsened. She frequently increases her oxygen to 3 L. She has nebulizers at home but she does not use them.  Workup in ED, BMET significant for chloride 89, CO2 35, creatinine 1.21, complete blood count significant for WBC 13.7 hemoglobin 10.9, chest x-ray revealed fractures of the left seventh eighth and ninth ribs. No pneumothorax or effusion. By basilar atelectasis. The emergency department is hemodynamically stable afebrile with oxygen saturation level 89% on 2 L.  Oxygen supplementation increased to 4 L and her oxygen saturation level increased to 93%.    Assessment & Plan    Principal Problem: Acute on chronic respiratory failure : Due to COPD exacerbation and poor respiratory effort related to fractured ribs from mechanical fall - Continue O2 via nasal cannula, incentive spirometry, continue Symbicort, DuoNeb nebs, Solu-Medrol.  - Continue pain control.  - 2-D echo also ordered to rule out any underlying CHF, patient reported leg swelling and recent weight gain, was not able to quantify   Active problems  Fractured ribs secondary to mechanical fall.  - Rib series showed fractures of the left seventh, eighth, ninth ribs, no pneumothorax or effusion -  Physical therapy recommended skilled nursing facility - Still in significant pain 10/10, placed on Lidoderm patch, tramadol for moderate pain, oxycodone as needed for severe breakthrough, scheduled Tylenol, Robaxin. Added low-dose OxyContin 10mg  q12hrs after discussing with the patient in detail.    Acute kidney injury. -  Creatinine improved, improved with gentle hydration.  Diabetes mellitus, type II, uncontrolled - Continue to hold oral hypoglycemics  -  Of note, she is on 10 mg of prednisone at home daily. Will obtain a hemoglobin A1c - Placed on sliding scale insulin   COPD with chronic respiratory failure -  will continue Symbicort, DuoNeb nebs Transition to oral prednisone, She is on 10 mg of prednisone daily at home.    Anemia. Likely related to chronic disease.  - Current hemoglobin appears close to baseline, continue her home iron supplement.   RA: stable at baseline.  - Continue prednisone    HTN: controlled.  - Continue Lasix   Code Status: Full code  Family Communication: Discussed in detail with the patient, all imaging results, lab results explained to the patient    Disposition Plan; DC to skilled nursing facility in a.m.   Time Spent in minutes  25  minutes  Procedures  CXR  Consults   None   DVT Prophylaxis   Lovenox   Medications  Scheduled Meds: . acetaminophen  500 mg Oral TID  . atenolol  25 mg Oral Daily  . budesonide-formoterol  2 puff Inhalation BID  . chlorpheniramine-HYDROcodone  5 mL Oral Q12H  . enoxaparin (LOVENOX) injection  60 mg Subcutaneous Q24H  . furosemide  20 mg Oral Daily  . insulin aspart  0-15 Units Subcutaneous TID WC  . insulin aspart  0-5 Units Subcutaneous QHS  . ipratropium-albuterol  3 mL Nebulization TID  . levofloxacin (LEVAQUIN) IV  750 mg Intravenous Q24H  . lidocaine  1 patch Transdermal Q24H  . methocarbamol  750 mg Oral TID  . pantoprazole  40 mg Oral Daily  . [START ON 02/09/2015] predniSONE  40 mg Oral Q breakfast  . predniSONE  60 mg Oral Q breakfast  . sodium chloride  3 mL Intravenous Q12H  . traZODone  50 mg Oral QHS   Continuous Infusions:  PRN Meds:.sodium chloride, acetaminophen **OR** acetaminophen, albuterol, alum & mag hydroxide-simeth, LORazepam, morphine injection, ondansetron **OR** ondansetron (ZOFRAN) IV, oxyCODONE, polyethylene glycol, sodium chloride, traMADol   Antibiotics   Anti-infectives    Start     Dose/Rate Route Frequency Ordered Stop   02/06/15 1115  levofloxacin (LEVAQUIN) IVPB 750 mg     750 mg 100 mL/hr over 90 Minutes Intravenous Every 24 hours 02/06/15 1109          Subjective:   [name] was seen and examined today.  still having severe rib pain, 10/10, increased her heart rate to 140s with pain. Breathing is better, no wheezing. Patient denies dizziness, abdominal pain, N/V/D/C, new weakness, numbess, tingling. No acute events overnight.    Objective:   Blood pressure 152/66, pulse 74, temperature 98.2 F (36.8 C), temperature source Oral, resp. rate 24, height 5\' 7"  (1.702 m), weight 117.6 kg (259 lb 4.2 oz), SpO2 93 %.  Wt Readings from Last 3 Encounters:  02/06/15 117.6 kg (259 lb 4.2 oz)  06/03/11 90.719 kg (200 lb)  03/24/11  92.534 kg (204 lb)     Intake/Output Summary (Last 24 hours) at 02/08/15 1159 Last data filed at 02/07/15 1508  Gross per 24 hour  Intake      0 ml  Output    450 ml  Net   -450 ml    Exam  General: Alert and oriented x 3, NAD  HEENT:  PERRLA, EOMI, Anicteric Sclera,    Neck: Supple, no JVD, no masses  CVS: S1 S2 clear, RRR  Respiratory: CTAB, tender in the left chest wall  Abdomen: Soft, nontender, nondistended, + bowel sounds  Ext:  no cyanosis clubbing or edema  Neuro: no new deficits  Skin: No rashes  Psych: Normal affect and demeanor, alert and oriented x3    Data Review   Micro Results Recent Results (from the past 240 hour(s))  MRSA PCR Screening     Status: None   Collection Time: 02/06/15  2:33 PM  Result Value Ref Range Status   MRSA by PCR NEGATIVE NEGATIVE Final    Comment:        The GeneXpert MRSA Assay (FDA approved for NASAL specimens only), is one component of a comprehensive MRSA colonization surveillance program. It is not intended to diagnose MRSA infection nor to guide or monitor treatment for MRSA infections.     Radiology Reports Dg Ribs Unilateral W/chest Left  02/06/2015  CLINICAL DATA:  Fall today.  Left chest pain EXAM: LEFT RIBS AND CHEST - 3+ VIEW COMPARISON:  04/11/2014 FINDINGS: Slightly displaced fracture left eighth rib posterior laterally which appears acute. Fractures of the left seventh and ninth ribs also noted. No pneumothorax or effusion Mild bibasilar atelectasis.  Apical pleural scarring bilaterally IMPRESSION: Fractures left seventh, eighth, and ninth ribs. No pneumothorax or effusion. Bibasilar atelectasis. Electronically Signed   By: Marlan Palau M.D.   On: 02/06/2015 08:26    CBC  Recent Labs Lab 02/06/15 0800 02/07/15 0638 02/08/15 0537  WBC 13.7* 11.1* 11.5*  HGB 10.9* 10.5* 10.1*  HCT 36.7 34.9* 33.6*  PLT 365 334 374  MCV 89.1 87.9 85.7  MCH 26.5 26.4 25.8*  MCHC 29.7* 30.1 30.1  RDW 15.4  15.1 15.2    Chemistries   Recent Labs Lab 02/06/15 0800 02/07/15 0638 02/08/15 0537  NA 136 133* 129*  K 4.6 5.3* 4.0  CL 89* 85* 81*  CO2 35* 37* 38*  GLUCOSE 137* 172* 205*  BUN 25* 20 22*  CREATININE 1.21* 1.11* 1.10*  CALCIUM 8.9 9.1 8.9   ------------------------------------------------------------------------------------------------------------------ estimated creatinine clearance is 64 mL/min (by C-G formula based on Cr of 1.1). ------------------------------------------------------------------------------------------------------------------  Recent Labs  02/06/15 0800  HGBA1C 7.3*   ------------------------------------------------------------------------------------------------------------------ No results for input(s): CHOL, HDL, LDLCALC, TRIG, CHOLHDL, LDLDIRECT in the last 72 hours. ------------------------------------------------------------------------------------------------------------------ No results for input(s): TSH, T4TOTAL, T3FREE, THYROIDAB in the last 72 hours.  Invalid input(s): FREET3 ------------------------------------------------------------------------------------------------------------------ No results for input(s): VITAMINB12, FOLATE, FERRITIN, TIBC, IRON, RETICCTPCT in the last 72 hours.  Coagulation profile No results for input(s): INR, PROTIME in the last 168 hours.  No results for input(s): DDIMER in the last 72 hours.  Cardiac Enzymes No results for input(s): CKMB, TROPONINI, MYOGLOBIN in the last 168 hours.  Invalid input(s): CK ------------------------------------------------------------------------------------------------------------------ Invalid input(s): POCBNP   Recent Labs  02/07/15 0737 02/07/15 1130 02/07/15 1650 02/07/15 2121 02/08/15 0820 02/08/15 1135  GLUCAP 158* 212* 192* 224* 179* 208*     RAI,RIPUDEEP M.D. [facility] 02/08/2015, 11:59 AM  Pager: [phone] Between 7am to 7pm - call Pager -  [phone]  After 7pm go to [email or web address] - password [email or web address]  Call night coverage person covering after 7pm

## 2015-02-09 DIAGNOSIS — J9621 Acute and chronic respiratory failure with hypoxia: Secondary | ICD-10-CM

## 2015-02-09 LAB — BASIC METABOLIC PANEL
ANION GAP: 13 (ref 5–15)
BUN: 26 mg/dL — ABNORMAL HIGH (ref 6–20)
CALCIUM: 9.2 mg/dL (ref 8.9–10.3)
CO2: 36 mmol/L — ABNORMAL HIGH (ref 22–32)
Chloride: 79 mmol/L — ABNORMAL LOW (ref 101–111)
Creatinine, Ser: 1.23 mg/dL — ABNORMAL HIGH (ref 0.44–1.00)
GFR, EST AFRICAN AMERICAN: 51 mL/min — AB (ref >60)
GFR, EST NON AFRICAN AMERICAN: 44 mL/min — AB (ref >60)
Glucose, Bld: 128 mg/dL — ABNORMAL HIGH (ref 65–99)
Potassium: 3.9 mmol/L (ref 3.5–5.1)
Sodium: 128 mmol/L — ABNORMAL LOW (ref 135–145)

## 2015-02-09 LAB — CBC
HCT: 37.4 % (ref 36.0–46.0)
Hemoglobin: 11.5 g/dL — ABNORMAL LOW (ref 12.0–15.0)
MCH: 26.1 pg (ref 26.0–34.0)
MCHC: 30.7 g/dL (ref 30.0–36.0)
MCV: 84.8 fL (ref 78.0–100.0)
PLATELETS: 399 10*3/uL (ref 150–400)
RBC: 4.41 MIL/uL (ref 3.87–5.11)
RDW: 15.3 % (ref 11.5–15.5)
WBC: 11.8 10*3/uL — AB (ref 4.0–10.5)

## 2015-02-09 LAB — GLUCOSE, CAPILLARY
GLUCOSE-CAPILLARY: 157 mg/dL — AB (ref 65–99)
Glucose-Capillary: 118 mg/dL — ABNORMAL HIGH (ref 65–99)

## 2015-02-09 MED ORDER — SENNOSIDES-DOCUSATE SODIUM 8.6-50 MG PO TABS: 2.0000 | ORAL_TABLET | Freq: Every day | ORAL | Status: DC

## 2015-02-09 MED ORDER — ACETAMINOPHEN 500 MG PO TABS: 500.0000 mg | ORAL_TABLET | Freq: Three times a day (TID) | ORAL | Status: DC

## 2015-02-09 MED ORDER — PREDNISONE 10 MG PO TABS: 10.0000 mg | ORAL_TABLET | Freq: Every day | ORAL | Status: DC

## 2015-02-09 MED ORDER — LEVOFLOXACIN 750 MG PO TABS: 750.0000 mg | ORAL_TABLET | Freq: Every day | ORAL | Status: DC

## 2015-02-09 MED ORDER — POLYETHYLENE GLYCOL 3350 17 G PO PACK: 17.0000 g | PACK | Freq: Every day | ORAL | Status: DC

## 2015-02-09 MED ORDER — LIDOCAINE 5 % EX PTCH: 1.0000 | MEDICATED_PATCH | CUTANEOUS | Status: DC

## 2015-02-09 MED ORDER — PREDNISONE 10 MG PO TABS: ORAL_TABLET | ORAL | Status: DC

## 2015-02-09 MED ORDER — HYDROCOD POLST-CPM POLST ER 10-8 MG/5ML PO SUER: 5.0000 mL | Freq: Two times a day (BID) | ORAL | Status: DC | PRN

## 2015-02-09 MED ORDER — OXYCODONE HCL 5 MG PO TABS: 5.0000 mg | ORAL_TABLET | ORAL | Status: DC | PRN

## 2015-02-09 MED ORDER — OXYCODONE HCL ER 10 MG PO T12A: 10.0000 mg | EXTENDED_RELEASE_TABLET | Freq: Two times a day (BID) | ORAL | Status: DC

## 2015-02-09 MED ORDER — LORAZEPAM 0.5 MG PO TABS: 0.5000 mg | ORAL_TABLET | Freq: Three times a day (TID) | ORAL | Status: DC | PRN

## 2015-02-09 MED ORDER — OXYBUTYNIN CHLORIDE 5 MG PO TABS: 2.5000 mg | ORAL_TABLET | Freq: Two times a day (BID) | ORAL | Status: DC

## 2015-02-09 MED ORDER — OXYBUTYNIN CHLORIDE 5 MG PO TABS
2.5000 mg | ORAL_TABLET | Freq: Two times a day (BID) | ORAL | Status: DC
  Administered 2015-02-09: 2.5 mg via ORAL
  Filled 2015-02-09: qty 1

## 2015-02-09 MED ORDER — TRAMADOL HCL 50 MG PO TABS: 50.0000 mg | ORAL_TABLET | Freq: Three times a day (TID) | ORAL | Status: DC | PRN

## 2015-02-09 MED ORDER — IPRATROPIUM-ALBUTEROL 0.5-2.5 (3) MG/3ML IN SOLN: 3.0000 mL | Freq: Three times a day (TID) | RESPIRATORY_TRACT | Status: DC

## 2015-02-09 MED ORDER — METHOCARBAMOL 750 MG PO TABS: 750.0000 mg | ORAL_TABLET | Freq: Three times a day (TID) | ORAL | Status: DC

## 2015-02-09 NOTE — Discharge Summary (Addendum)
Physician Discharge Summary   Patient ID: [name] MRN: [id number] DOB/AGE: 1945/07/27 69 y.o.  Admit date: 02/06/2015 Discharge date: 02/09/2015  Primary Care Physician:  Jaime Apo, MD  Discharge Diagnoses:     . Acute on chronic respiratory failure after trauma (HCC) . Fracture, ribs . COPD (chronic obstructive pulmonary disease) exacerbation  . Rheumatoid arthritis (HCC) . Acute kidney injury (HCC) . Anemia . Hypertension   Consults:  None   Recommendations for Outpatient Follow-up:  Please note patient is started on OxyContin long-acting 10 mg every 12 hours due to severe chest pain from fractured ribs. Patient recommended to wean down to bed time as tolerated, then off.   Fall precautions    TESTS THAT NEED FOLLOW-UP None   DIET: carb modified    Allergies:   Allergies  Allergen Reactions  . Gold-Containing Drug Products Other (See Comments)    Blisters   . Tape Dermatitis     Discharge Medications:   Medication List    TAKE these medications        acetaminophen 500 MG tablet  Commonly known as:  TYLENOL  Take 1 tablet (500 mg total) by mouth 3 (three) times daily.     aspirin EC 81 MG tablet  Take 81 mg by mouth daily.     atenolol 25 MG tablet  Commonly known as:  TENORMIN  Take 25 mg by mouth daily.     budesonide-formoterol 80-4.5 MCG/ACT inhaler  Commonly known as:  SYMBICORT  Inhale 2 puffs into the lungs 2 (two) times daily.     Calcium 600-200 MG-UNIT tablet  Take 1 tablet by mouth daily.     CENTRUM SILVER ADULT 50+ PO  Take 1 tablet by mouth daily.     chlorpheniramine-HYDROcodone 10-8 MG/5ML Suer  Commonly known as:  TUSSIONEX  Take 5 mLs by mouth every 12 (twelve) hours as needed for cough.     ferrous sulfate 325 (65 FE) MG tablet  Take 325 mg by mouth daily with breakfast.     furosemide 20 MG tablet  Commonly known as:  LASIX  Take 20 mg by mouth daily.     ibandronate 150 MG tablet  Commonly known  as:  BONIVA  Take 150 mg by mouth every 30 (thirty) days. Take in the morning with a full glass of water, on an empty stomach, and do not take anything else by mouth or lie down for the next 30 min.     ipratropium-albuterol 0.5-2.5 (3) MG/3ML Soln  Commonly known as:  DUONEB  Take 3 mLs by nebulization 3 (three) times daily.     levofloxacin 750 MG tablet  Commonly known as:  LEVAQUIN  Take 1 tablet (750 mg total) by mouth daily. X 7days     lidocaine 5 %  Commonly known as:  LIDODERM  Place 1 patch onto the skin daily. Remove & Discard patch within 12 hours or as directed by MD     LORazepam 0.5 MG tablet  Commonly known as:  ATIVAN  Take 1 tablet (0.5 mg total) by mouth every 8 (eight) hours as needed for anxiety.     metFORMIN 500 MG tablet  Commonly known as:  GLUCOPHAGE  Take 500 mg by mouth 2 (two) times daily with a meal.     methocarbamol 750 MG tablet  Commonly known as:  ROBAXIN  Take 1 tablet (750 mg total) by mouth 3 (three) times daily.     omeprazole 20 MG capsule  Commonly known as:  PRILOSEC  Take 20 mg by mouth daily.     oxybutynin 5 MG tablet  Commonly known as:  DITROPAN  Take 0.5 tablets (2.5 mg total) by mouth 2 (two) times daily.     oxyCODONE 5 MG immediate release tablet  Commonly known as:  Oxy IR/ROXICODONE  Take 1-2 tablets (5-10 mg total) by mouth every 4 (four) hours as needed for severe pain or breakthrough pain.     OxyCODONE 10 mg T12a 12 hr tablet  Commonly known as:  OXYCONTIN  Take 1 tablet (10 mg total) by mouth every 12 (twelve) hours.     OXYGEN  Inhale 2.5 L into the lungs continuous.     polyethylene glycol packet  Commonly known as:  MIRALAX / GLYCOLAX  Take 17 g by mouth daily.     predniSONE 10 MG tablet  Commonly known as:  DELTASONE  Take 1 tablet (10 mg total) by mouth daily. After prednisone taper is done, resume the maintenance dose of 10mg      predniSONE 10 MG tablet  Commonly known as:  DELTASONE  Prednisone  dosing: Take  Prednisone 40mg  (4 tabs) x 2 days, then taper to 30mg  (3 tabs) x 3 days, then 20mg  (2 tabs) x 3days, then resume maintenance dose of 10mg  (1 tab) daily.  Start taking on:  02/10/2015     PROAIR HFA 108 (90 BASE) MCG/ACT inhaler  Generic drug:  albuterol  Inhale 2 puffs into the lungs every 6 (six) hours as needed. For shortness of breath     senna-docusate 8.6-50 MG tablet  Commonly known as:  Senokot-S  Take 2 tablets by mouth at bedtime.     spironolactone-hydrochlorothiazide 25-25 MG tablet  Commonly known as:  ALDACTAZIDE  Take 1 tablet by mouth daily.     tiotropium 18 MCG inhalation capsule  Commonly known as:  SPIRIVA  Place 18 mcg into inhaler and inhale daily.     traMADol 50 MG tablet  Commonly known as:  ULTRAM  Take 1 tablet (50 mg total) by mouth every 8 (eight) hours as needed for moderate pain.     traZODone 50 MG tablet  Commonly known as:  DESYREL  Take 50 mg by mouth at bedtime.         Brief H and P: For complete details please refer to admission H and P, but in briefPer Dr. [name] on 02/06/15 [name] is a 69 y.o. female with chronic respiratory failure on 2.5 L of oxygen at home related to COPD, afib, obesity, hypertension, anemia, diabetes, GERD, rheumatoid arthritis presented to ED from home with worsening shortness of breath after a mechanical fall.  Patient reported that she was sleeping on the couch due to her sister/family visiting, she got up on the morning of admission at 3 AM to the restroom and she tripped falling backwards hitting the arm going back chair on her way to the floor. She denied hitting her head or loss of consciousness. She was unable to get up from the floor without assistance due to immediate pain she felt on the left side. EMS was called, she was provided with 5 mg of albuterol and was transported to the emergency department. She described the pain as pleuritic, constant sharp located on the left side radiating to  the back. She denied any dizziness, syncopal episode. She does report that over the last 3 months her baseline respiratory status has gradually worsened. She frequently increases her oxygen  to 3 L. She has nebulizers at home but she does not use them.  Workup in ED, BMET significant for chloride 89, CO2 35, creatinine 1.21, complete blood count significant for WBC 13.7 hemoglobin 10.9, chest x-ray revealed fractures of the left seventh eighth and ninth ribs. No pneumothorax or effusion. By basilar atelectasis. The emergency department is hemodynamically stable afebrile with oxygen saturation level 89% on 2 L. Oxygen supplementation increased to 4 L and her oxygen saturation level increased to 93%.  Hospital Course:  Acute on chronic respiratory failure : Due to COPD exacerbation and poor respiratory effort related to fractured ribs from mechanical fall Patient was admitted. She was placed on O2 supplementation, scheduled nebs, IV Solu-Medrol, IV Levaquin, Symbicort. Rib series had shown fractures of the left seventh, eighth, ninth ribs. She was placed on pain control and incentive spirometry. 2-D echo was obtained which showed EF of 55-60%, no wall motion abnormalities.   Fractured ribs secondary to mechanical fall.  - Rib series showed fractures of the left seventh, eighth, ninth ribs, no pneumothorax or effusion - Physical therapy recommended skilled nursing facility however patient declined as she did not like the choice of the facility. She decided to go home with sister and home health. - Due to her significant pain level, patient was placed on Lidoderm patch, tramadol for moderate pain, oxycodone as needed for severe breakthrough, scheduled Tylenol, Robaxin. Added low-dose OxyContin 10mg  q12hrs after discussing with the patient in detail about pros and cons of the long-acting narcotics. I strongly recommended to wean Oxycontin to bedtime only as tolerated and then wean off in next week to  10days.  Acute kidney injury. - Creatinine improved, improved with gentle hydration.  Diabetes mellitus, type II, uncontrolled - Patient can resume metformin. She was placed on sliding scale insulin while inpatient - Of note, she is on 10 mg of prednisone at home daily. - Hemoglobin A1c 7.3  COPD with chronic respiratory failure - will continue Symbicort, DuoNeb nebs. - Transitioned to oral prednisone taper. Please continue prednisone 10 mg maintenance dose after she has completed the prednisone taper of 20 mg.   Anemia. Likely related to chronic disease.  - Current hemoglobin appears close to baseline, continue her home iron supplement.  RA: stable at baseline.  - Continue prednisone , takes prednisone 10 mg daily  HTN: controlled.  - Continue Lasix   Day of Discharge BP 141/67 mmHg  Pulse 73  Temp(Src) 98.1 F (36.7 C) (Oral)  Resp 16  Ht 5\' 7"  (1.702 m)  Wt 117.6 kg (259 lb 4.2 oz)  BMI 40.60 kg/m2  SpO2 94%  Physical Exam: General: Alert and awake oriented x3 not in any acute distress. HEENT: anicteric sclera, pupils reactive to light and accommodation CVS: S1-S2 clear no murmur rubs or gallops Chest: clear to auscultation bilaterally, no wheezing rales or rhonchi, left chest wall tenderness due to rib fractures Abdomen: soft nontender, nondistended, normal bowel sounds Extremities: no cyanosis, clubbing or edema noted bilaterally Neuro: Cranial nerves II-XII intact, no focal neurological deficits   The results of significant diagnostics from this hospitalization (including imaging, microbiology, ancillary and laboratory) are listed below for reference.    LAB RESULTS: Basic Metabolic Panel:  Recent Labs Lab 02/08/15 0537 02/09/15 0526  NA 129* 128*  K 4.0 3.9  CL 81* 79*  CO2 38* 36*  GLUCOSE 205* 128*  BUN 22* 26*  CREATININE 1.10* 1.23*  CALCIUM 8.9 9.2   Liver Function Tests: No results for input(s):  AST, ALT, ALKPHOS, BILITOT, PROT,  ALBUMIN in the last 168 hours. No results for input(s): LIPASE, AMYLASE in the last 168 hours. No results for input(s): AMMONIA in the last 168 hours. CBC:  Recent Labs Lab 02/08/15 0537 02/09/15 0526  WBC 11.5* 11.8*  HGB 10.1* 11.5*  HCT 33.6* 37.4  MCV 85.7 84.8  PLT 374 399   Cardiac Enzymes: No results for input(s): CKTOTAL, CKMB, CKMBINDEX, TROPONINI in the last 168 hours. BNP: Invalid input(s): POCBNP CBG:  Recent Labs Lab 02/08/15 2120 02/09/15 0757  GLUCAP 135* 118*    Significant Diagnostic Studies:  Dg Ribs Unilateral W/chest Left  02/06/2015  CLINICAL DATA:  Fall today.  Left chest pain EXAM: LEFT RIBS AND CHEST - 3+ VIEW COMPARISON:  04/11/2014 FINDINGS: Slightly displaced fracture left eighth rib posterior laterally which appears acute. Fractures of the left seventh and ninth ribs also noted. No pneumothorax or effusion Mild bibasilar atelectasis.  Apical pleural scarring bilaterally IMPRESSION: Fractures left seventh, eighth, and ninth ribs. No pneumothorax or effusion. Bibasilar atelectasis. Electronically Signed   By: Marlan Palau M.D.   On: 02/06/2015 08:26    2D ECHO: Study Conclusions  - Left ventricle: The cavity size was normal. Systolic function was normal. The estimated ejection fraction was in the range of 55% to 60%. Wall motion was normal; there were no regional wall motion abnormalities. - Left atrium: The atrium was mildly dilated.  Disposition and Follow-up: Discharge Instructions    Diet Carb Modified    Complete by:  As directed      Increase activity slowly    Complete by:  As directed             DISPOSITION:  Home with home health   DISCHARGE FOLLOW-UP Follow-up Information    Follow up with POLITE,RONALD D, MD. Schedule an appointment as soon as possible for a visit in 2 weeks.   Specialty:  Internal Medicine   Why:  for hospital follow-up   Contact information:   [address] [place]  [address] [phone]        Time spent on Discharge: 35 mins   Signed:   Emmalie Haigh M.D. [facility] 02/09/2015, 10:48 AM Pager: [phone]

## 2015-02-09 NOTE — Clinical Social Work Note (Signed)
Clinical Social Worker continuing to follow patient and family for support and discharge planning needs.  CSW met with patient at bedside regarding placement options for SNF.  Patient only has a two night qualifying stay for Medicare therefore would require an LOG for placement.  Patient is not happy with facility choice at [facility] and has opted to return home with assistance from her sister and home health.  CSW notified CM who will address home health needs with patient.  Patient states that her sister will be able to provide transportation and oxygen at discharge.  Clinical Social Worker will sign off for now as social work intervention is no longer needed. Please consult [place] again if new need arises.  Barbette Or, [place]

## 2015-02-09 NOTE — Progress Notes (Signed)
NURSING PROGRESS NOTE  DRINA JOBST [id number] Discharge Data: 02/09/2015 7:46 PM Attending Provider: No att. providers found [id number],OVFIEP D, MD     Marney Setting to be D/C'd Home per MD order.  Discussed with the patient the After Visit Summary and all questions fully answered. All IV's discontinued with no bleeding noted. All belongings returned to patient for patient to take home. O2 provided for pt. Pt d/c'd off unit via w/c by nursing staff.    Last Vital Signs:  Blood pressure 126/56, pulse 73, temperature 98.1 F (36.7 C), temperature source Oral, resp. rate 20, height 5\' 7"  (1.702 m), weight 117.6 kg (259 lb 4.2 oz), SpO2 94 %.  Discharge Medication List   Medication List    TAKE these medications        acetaminophen 500 MG tablet  Commonly known as:  TYLENOL  Take 1 tablet (500 mg total) by mouth 3 (three) times daily.     aspirin EC 81 MG tablet  Take 81 mg by mouth daily.     atenolol 25 MG tablet  Commonly known as:  TENORMIN  Take 25 mg by mouth daily.     budesonide-formoterol 80-4.5 MCG/ACT inhaler  Commonly known as:  SYMBICORT  Inhale 2 puffs into the lungs 2 (two) times daily.     Calcium 600-200 MG-UNIT tablet  Take 1 tablet by mouth daily.     CENTRUM SILVER ADULT 50+ PO  Take 1 tablet by mouth daily.     chlorpheniramine-HYDROcodone 10-8 MG/5ML Suer  Commonly known as:  TUSSIONEX  Take 5 mLs by mouth every 12 (twelve) hours as needed for cough.     ferrous sulfate 325 (65 FE) MG tablet  Take 325 mg by mouth daily with breakfast.     furosemide 20 MG tablet  Commonly known as:  LASIX  Take 20 mg by mouth daily.     ibandronate 150 MG tablet  Commonly known as:  BONIVA  Take 150 mg by mouth every 30 (thirty) days. Take in the morning with a full glass of water, on an empty stomach, and do not take anything else by mouth or lie down for the next 30 min.     ipratropium-albuterol 0.5-2.5 (3) MG/3ML Soln  Commonly known as:  DUONEB  Take  3 mLs by nebulization 3 (three) times daily.     levofloxacin 750 MG tablet  Commonly known as:  LEVAQUIN  Take 1 tablet (750 mg total) by mouth daily. X 7days     lidocaine 5 %  Commonly known as:  LIDODERM  Place 1 patch onto the skin daily. Remove & Discard patch within 12 hours or as directed by MD     LORazepam 0.5 MG tablet  Commonly known as:  ATIVAN  Take 1 tablet (0.5 mg total) by mouth every 8 (eight) hours as needed for anxiety.     metFORMIN 500 MG tablet  Commonly known as:  GLUCOPHAGE  Take 500 mg by mouth 2 (two) times daily with a meal.     methocarbamol 750 MG tablet  Commonly known as:  ROBAXIN  Take 1 tablet (750 mg total) by mouth 3 (three) times daily.     omeprazole 20 MG capsule  Commonly known as:  PRILOSEC  Take 20 mg by mouth daily.     oxybutynin 5 MG tablet  Commonly known as:  DITROPAN  Take 0.5 tablets (2.5 mg total) by mouth 2 (two) times daily.  oxyCODONE 5 MG immediate release tablet  Commonly known as:  Oxy IR/ROXICODONE  Take 1-2 tablets (5-10 mg total) by mouth every 4 (four) hours as needed for severe pain or breakthrough pain.     OxyCODONE 10 mg T12a 12 hr tablet  Commonly known as:  OXYCONTIN  Take 1 tablet (10 mg total) by mouth every 12 (twelve) hours.     OXYGEN  Inhale 2.5 L into the lungs continuous.     polyethylene glycol packet  Commonly known as:  MIRALAX / GLYCOLAX  Take 17 g by mouth daily.     predniSONE 10 MG tablet  Commonly known as:  DELTASONE  Prednisone dosing: Take  Prednisone 40mg  (4 tabs) x 2 days, then taper to 30mg  (3 tabs) x 3 days, then 20mg  (2 tabs) x 3days, then resume maintenance dose of 10mg  (1 tab) daily.  Start taking on:  02/10/2015     PROAIR HFA 108 (90 BASE) MCG/ACT inhaler  Generic drug:  albuterol  Inhale 2 puffs into the lungs every 6 (six) hours as needed. For shortness of breath     senna-docusate 8.6-50 MG tablet  Commonly known as:  Senokot-S  Take 2 tablets by mouth at  bedtime.     spironolactone-hydrochlorothiazide 25-25 MG tablet  Commonly known as:  ALDACTAZIDE  Take 1 tablet by mouth daily.     tiotropium 18 MCG inhalation capsule  Commonly known as:  SPIRIVA  Place 18 mcg into inhaler and inhale daily.     traMADol 50 MG tablet  Commonly known as:  ULTRAM  Take 1 tablet (50 mg total) by mouth every 8 (eight) hours as needed for moderate pain.     traZODone 50 MG tablet  Commonly known as:  DESYREL  Take 50 mg by mouth at bedtime.         , RN

## 2015-02-09 NOTE — Care Management Note (Addendum)
Case Management Note  Patient Details  Name: [name] MRN: [id number] Date of Birth: 1945-05-19  Subjective/Objective:         Patient has decided she wants to go home per CSW Brayton Caves, patient chose [facility] for [facility] , Pt, OT, aide and Child psychotherapist.  Referral made to [facility] with [facility].  Soc will begin 24-48 hrs post discharge.  Patient states sister will bring her home oxygen tank here to the hospital so she can go home with it, she has home oxygen with [facility] also.             Action/Plan:   Expected Discharge Date:                  Expected Discharge Plan:  Skilled Nursing Facility  In-House Referral:  Clinical Social Work  Discharge planning Services  CM Consult  Post Acute Care Choice:    Choice offered to:     DME Arranged:    DME Agency:     HH Arranged:  RN, PT, OT, Nurse's Aide, Social Work [employer] Agency:  [facility]  Status of Service:  Completed, signed off  Medicare Important Message Given:  Yes-second notification given Date Medicare IM Given:    Medicare IM give by:    Date Additional Medicare IM Given:    Additional Medicare Important Message give by:     If discussed at Long Length of Stay Meetings, dates discussed:    Additional Comments:  Leone Haven, RN 02/09/2015, 1:50 PM

## 2015-02-09 NOTE — Care Management Note (Signed)
Case Management Note  Patient Details  Name: JAZMEEN AXTELL MRN: [id number] Date of Birth: 01/07/1946  Subjective/Objective:    Patient is for dc to snf today, CSW following.                Action/Plan:   Expected Discharge Date:                  Expected Discharge Plan:  Skilled Nursing Facility  In-House Referral:  Clinical Social Work  Discharge planning Services  CM Consult  Post Acute Care Choice:    Choice offered to:     DME Arranged:    DME Agency:     HH Arranged:    HH Agency:     Status of Service:  Completed, signed off  Medicare Important Message Given:    Date Medicare IM Given:    Medicare IM give by:    Date Additional Medicare IM Given:    Additional Medicare Important Message give by:     If discussed at Long Length of Stay Meetings, dates discussed:    Additional Comments:  Leone Haven, RN 02/09/2015, 11:59 AM

## 2015-02-09 NOTE — Care Management Important Message (Signed)
Important Message  Patient Details  Name: [name] MRN: [id number] Date of Birth: [birth year]   Medicare Important Message Given:  Yes-second notification given    Kyla Balzarine 02/09/2015, 12:27 PM

## 2015-02-20 ENCOUNTER — Ambulatory Visit
Admission: RE | Admit: 2015-02-20 | Discharge: 2015-02-20 | Disposition: A | Discharge status: Home / Self Care | Payer: Medicare Other | Source: Ambulatory Visit | Attending: Internal Medicine | Admitting: Internal Medicine

## 2015-02-20 ENCOUNTER — Other Ambulatory Visit: Payer: Self-pay | Admitting: Internal Medicine

## 2015-02-20 DIAGNOSIS — S2232XA Fracture of one rib, left side, initial encounter for closed fracture: Secondary | ICD-10-CM

## 2015-02-20 DIAGNOSIS — J449 Chronic obstructive pulmonary disease, unspecified: Secondary | ICD-10-CM

## 2015-09-25 ENCOUNTER — Encounter (HOSPITAL_COMMUNITY): Payer: Self-pay | Admitting: Emergency Medicine

## 2015-09-25 ENCOUNTER — Inpatient Hospital Stay (HOSPITAL_COMMUNITY)
Admission: EM | Admit: 2015-09-25 | Discharge: 2015-10-02 | DRG: 871 | Disposition: A | Discharge status: Skilled Nursing Facility | Payer: Medicare Other | Attending: Internal Medicine | Admitting: Internal Medicine

## 2015-09-25 ENCOUNTER — Emergency Department (HOSPITAL_COMMUNITY): Payer: Medicare Other

## 2015-09-25 DIAGNOSIS — M069 Rheumatoid arthritis, unspecified: Secondary | ICD-10-CM | POA: Diagnosis present

## 2015-09-25 DIAGNOSIS — Z9981 Dependence on supplemental oxygen: Secondary | ICD-10-CM

## 2015-09-25 DIAGNOSIS — J9621 Acute and chronic respiratory failure with hypoxia: Secondary | ICD-10-CM | POA: Diagnosis present

## 2015-09-25 DIAGNOSIS — Z7951 Long term (current) use of inhaled steroids: Secondary | ICD-10-CM | POA: Diagnosis not present

## 2015-09-25 DIAGNOSIS — R6510 Systemic inflammatory response syndrome (SIRS) of non-infectious origin without acute organ dysfunction: Secondary | ICD-10-CM | POA: Diagnosis not present

## 2015-09-25 DIAGNOSIS — R0902 Hypoxemia: Secondary | ICD-10-CM

## 2015-09-25 DIAGNOSIS — D638 Anemia in other chronic diseases classified elsewhere: Secondary | ICD-10-CM | POA: Diagnosis present

## 2015-09-25 DIAGNOSIS — J209 Acute bronchitis, unspecified: Secondary | ICD-10-CM | POA: Diagnosis present

## 2015-09-25 DIAGNOSIS — I10 Essential (primary) hypertension: Secondary | ICD-10-CM | POA: Diagnosis not present

## 2015-09-25 DIAGNOSIS — E669 Obesity, unspecified: Secondary | ICD-10-CM | POA: Diagnosis present

## 2015-09-25 DIAGNOSIS — E279 Disorder of adrenal gland, unspecified: Secondary | ICD-10-CM | POA: Diagnosis present

## 2015-09-25 DIAGNOSIS — Z87891 Personal history of nicotine dependence: Secondary | ICD-10-CM | POA: Diagnosis not present

## 2015-09-25 DIAGNOSIS — I119 Hypertensive heart disease without heart failure: Secondary | ICD-10-CM | POA: Diagnosis present

## 2015-09-25 DIAGNOSIS — Z7984 Long term (current) use of oral hypoglycemic drugs: Secondary | ICD-10-CM | POA: Diagnosis not present

## 2015-09-25 DIAGNOSIS — IMO0002 Reserved for concepts with insufficient information to code with codable children: Secondary | ICD-10-CM | POA: Diagnosis present

## 2015-09-25 DIAGNOSIS — Z809 Family history of malignant neoplasm, unspecified: Secondary | ICD-10-CM

## 2015-09-25 DIAGNOSIS — J9622 Acute and chronic respiratory failure with hypercapnia: Secondary | ICD-10-CM | POA: Diagnosis present

## 2015-09-25 DIAGNOSIS — A419 Sepsis, unspecified organism: Secondary | ICD-10-CM | POA: Diagnosis present

## 2015-09-25 DIAGNOSIS — I4891 Unspecified atrial fibrillation: Secondary | ICD-10-CM | POA: Insufficient documentation

## 2015-09-25 DIAGNOSIS — K219 Gastro-esophageal reflux disease without esophagitis: Secondary | ICD-10-CM | POA: Diagnosis present

## 2015-09-25 DIAGNOSIS — E876 Hypokalemia: Secondary | ICD-10-CM | POA: Diagnosis present

## 2015-09-25 DIAGNOSIS — D509 Iron deficiency anemia, unspecified: Secondary | ICD-10-CM | POA: Diagnosis present

## 2015-09-25 DIAGNOSIS — J441 Chronic obstructive pulmonary disease with (acute) exacerbation: Secondary | ICD-10-CM | POA: Diagnosis present

## 2015-09-25 DIAGNOSIS — J440 Chronic obstructive pulmonary disease with acute lower respiratory infection: Secondary | ICD-10-CM | POA: Diagnosis present

## 2015-09-25 DIAGNOSIS — G4733 Obstructive sleep apnea (adult) (pediatric): Secondary | ICD-10-CM | POA: Diagnosis present

## 2015-09-25 DIAGNOSIS — Z6841 Body Mass Index (BMI) 40.0 and over, adult: Secondary | ICD-10-CM | POA: Diagnosis not present

## 2015-09-25 DIAGNOSIS — Z7982 Long term (current) use of aspirin: Secondary | ICD-10-CM | POA: Diagnosis not present

## 2015-09-25 DIAGNOSIS — Z7952 Long term (current) use of systemic steroids: Secondary | ICD-10-CM

## 2015-09-25 DIAGNOSIS — Z79899 Other long term (current) drug therapy: Secondary | ICD-10-CM | POA: Diagnosis not present

## 2015-09-25 DIAGNOSIS — E860 Dehydration: Secondary | ICD-10-CM | POA: Diagnosis present

## 2015-09-25 DIAGNOSIS — F329 Major depressive disorder, single episode, unspecified: Secondary | ICD-10-CM | POA: Diagnosis present

## 2015-09-25 DIAGNOSIS — D72829 Elevated white blood cell count, unspecified: Secondary | ICD-10-CM | POA: Diagnosis present

## 2015-09-25 DIAGNOSIS — E1165 Type 2 diabetes mellitus with hyperglycemia: Secondary | ICD-10-CM | POA: Diagnosis present

## 2015-09-25 DIAGNOSIS — E872 Acidosis: Secondary | ICD-10-CM | POA: Diagnosis present

## 2015-09-25 DIAGNOSIS — Z91048 Other nonmedicinal substance allergy status: Secondary | ICD-10-CM

## 2015-09-25 DIAGNOSIS — E785 Hyperlipidemia, unspecified: Secondary | ICD-10-CM | POA: Diagnosis present

## 2015-09-25 DIAGNOSIS — E8729 Other acidosis: Secondary | ICD-10-CM | POA: Diagnosis present

## 2015-09-25 DIAGNOSIS — E871 Hypo-osmolality and hyponatremia: Secondary | ICD-10-CM | POA: Diagnosis present

## 2015-09-25 DIAGNOSIS — I671 Cerebral aneurysm, nonruptured: Secondary | ICD-10-CM | POA: Diagnosis present

## 2015-09-25 DIAGNOSIS — R1011 Right upper quadrant pain: Secondary | ICD-10-CM

## 2015-09-25 DIAGNOSIS — E118 Type 2 diabetes mellitus with unspecified complications: Secondary | ICD-10-CM | POA: Diagnosis not present

## 2015-09-25 LAB — I-STAT ARTERIAL BLOOD GAS, ED
ACID-BASE EXCESS: 4 mmol/L — AB (ref 0.0–2.0)
ACID-BASE EXCESS: 2 mmol/L (ref 0.0–2.0)
Acid-Base Excess: 2 mmol/L (ref 0.0–2.0)
BICARBONATE: 28.5 meq/L — AB (ref 20.0–24.0)
Bicarbonate: 29.2 mEq/L — ABNORMAL HIGH (ref 20.0–24.0)
Bicarbonate: 30.4 mEq/L — ABNORMAL HIGH (ref 20.0–24.0)
O2 SAT: 96 %
O2 SAT: 99 %
O2 Saturation: 87 %
PCO2 ART: 48.9 mmHg — AB (ref 35.0–45.0)
PCO2 ART: 70.3 mmHg — AB (ref 35.0–45.0)
PCO2 ART: 53.6 mmHg — AB (ref 35.0–45.0)
PH ART: 7.39 (ref 7.350–7.450)
PO2 ART: 88 mmHg (ref 80.0–100.0)
PO2 ART: 68 mmHg — AB (ref 80.0–100.0)
PO2 ART: 147 mmHg — AB (ref 80.0–100.0)
Patient temperature: 100.5
Patient temperature: 100.5
Patient temperature: 98.5
TCO2: 31 mmol/L (ref 0–100)
TCO2: 32 mmol/L (ref 0–100)
TCO2: 30 mmol/L (ref 0–100)
pH, Arterial: 7.249 — ABNORMAL LOW (ref 7.350–7.450)
pH, Arterial: 7.334 — ABNORMAL LOW (ref 7.350–7.450)

## 2015-09-25 LAB — URINALYSIS, ROUTINE W REFLEX MICROSCOPIC
BILIRUBIN URINE: NEGATIVE
GLUCOSE, UA: NEGATIVE mg/dL
HGB URINE DIPSTICK: NEGATIVE
Ketones, ur: 15 mg/dL — AB
Leukocytes, UA: NEGATIVE
Nitrite: NEGATIVE
Protein, ur: NEGATIVE mg/dL
SPECIFIC GRAVITY, URINE: 1.017 (ref 1.005–1.030)
pH: 5 (ref 5.0–8.0)

## 2015-09-25 LAB — BASIC METABOLIC PANEL
ANION GAP: 18 — AB (ref 5–15)
ANION GAP: 18 — AB (ref 5–15)
BUN: 24 mg/dL — ABNORMAL HIGH (ref 6–20)
BUN: 23 mg/dL — AB (ref 6–20)
CALCIUM: 9.2 mg/dL (ref 8.9–10.3)
CO2: 30 mmol/L (ref 22–32)
CO2: 27 mmol/L (ref 22–32)
Calcium: 8 mg/dL — ABNORMAL LOW (ref 8.9–10.3)
Chloride: 78 mmol/L — ABNORMAL LOW (ref 101–111)
Chloride: 82 mmol/L — ABNORMAL LOW (ref 101–111)
Creatinine, Ser: 1.7 mg/dL — ABNORMAL HIGH (ref 0.44–1.00)
Creatinine, Ser: 1.55 mg/dL — ABNORMAL HIGH (ref 0.44–1.00)
GFR calc Af Amer: 38 mL/min — ABNORMAL LOW (ref >60)
GFR calc non Af Amer: 33 mL/min — ABNORMAL LOW (ref >60)
GFR, EST AFRICAN AMERICAN: 34 mL/min — AB (ref >60)
GFR, EST NON AFRICAN AMERICAN: 30 mL/min — AB (ref >60)
GLUCOSE: 305 mg/dL — AB (ref 65–99)
Glucose, Bld: 205 mg/dL — ABNORMAL HIGH (ref 65–99)
POTASSIUM: 3.6 mmol/L (ref 3.5–5.1)
POTASSIUM: 3 mmol/L — AB (ref 3.5–5.1)
SODIUM: 126 mmol/L — AB (ref 135–145)
Sodium: 127 mmol/L — ABNORMAL LOW (ref 135–145)

## 2015-09-25 LAB — CBC WITH DIFFERENTIAL/PLATELET
BASOS ABS: 0 10*3/uL (ref 0.0–0.1)
Basophils Relative: 0 %
EOS ABS: 0 10*3/uL (ref 0.0–0.7)
EOS PCT: 0 %
HCT: 38.1 % (ref 36.0–46.0)
Hemoglobin: 11.9 g/dL — ABNORMAL LOW (ref 12.0–15.0)
LYMPHS ABS: 1.8 10*3/uL (ref 0.7–4.0)
Lymphocytes Relative: 7 %
MCH: 25.8 pg — ABNORMAL LOW (ref 26.0–34.0)
MCHC: 31.2 g/dL (ref 30.0–36.0)
MCV: 82.6 fL (ref 78.0–100.0)
MONO ABS: 1.6 10*3/uL — AB (ref 0.1–1.0)
Monocytes Relative: 6 %
NEUTROS PCT: 87 %
Neutro Abs: 22.9 10*3/uL — ABNORMAL HIGH (ref 1.7–7.7)
PLATELETS: 414 10*3/uL — AB (ref 150–400)
RBC: 4.61 MIL/uL (ref 3.87–5.11)
RDW: 14.8 % (ref 11.5–15.5)
WBC: 26.3 10*3/uL — AB (ref 4.0–10.5)

## 2015-09-25 LAB — I-STAT TROPONIN, ED: TROPONIN I, POC: 0 ng/mL (ref 0.00–0.08)

## 2015-09-25 LAB — LACTIC ACID, PLASMA
LACTIC ACID, VENOUS: 2.6 mmol/L — AB (ref 0.5–2.0)
LACTIC ACID, VENOUS: 3 mmol/L — AB (ref 0.5–2.0)
LACTIC ACID, VENOUS: 2.2 mmol/L — AB (ref 0.5–2.0)
Lactic Acid, Venous: 2.4 mmol/L (ref 0.5–2.0)
Lactic Acid, Venous: 1.8 mmol/L (ref 0.5–2.0)

## 2015-09-25 LAB — CBG MONITORING, ED
GLUCOSE-CAPILLARY: 273 mg/dL — AB (ref 65–99)
Glucose-Capillary: 293 mg/dL — ABNORMAL HIGH (ref 65–99)
Glucose-Capillary: 328 mg/dL — ABNORMAL HIGH (ref 65–99)

## 2015-09-25 LAB — PROTIME-INR
INR: 1.27 (ref 0.00–1.49)
PROTHROMBIN TIME: 16.1 s — AB (ref 11.6–15.2)

## 2015-09-25 LAB — I-STAT CG4 LACTIC ACID, ED: LACTIC ACID, VENOUS: 1.69 mmol/L (ref 0.5–2.0)

## 2015-09-25 LAB — MAGNESIUM: Magnesium: 2 mg/dL (ref 1.7–2.4)

## 2015-09-25 LAB — GLUCOSE, CAPILLARY: Glucose-Capillary: 148 mg/dL — ABNORMAL HIGH (ref 65–99)

## 2015-09-25 LAB — PROCALCITONIN: PROCALCITONIN: 1.08 ng/mL

## 2015-09-25 LAB — BRAIN NATRIURETIC PEPTIDE: B NATRIURETIC PEPTIDE 5: 95.4 pg/mL (ref 0.0–100.0)

## 2015-09-25 LAB — APTT: aPTT: 27 seconds (ref 24–37)

## 2015-09-25 LAB — CK: Total CK: 168 U/L (ref 38–234)

## 2015-09-25 MED ORDER — INSULIN GLARGINE 100 UNIT/ML ~~LOC~~ SOLN
5.0000 [IU] | Freq: Every day | SUBCUTANEOUS | Status: DC
  Administered 2015-09-25 – 2015-10-02 (×8): 5 [IU] via SUBCUTANEOUS
  Filled 2015-09-25 (×8): qty 0.05

## 2015-09-25 MED ORDER — ACETAMINOPHEN 500 MG PO TABS
1000.0000 mg | ORAL_TABLET | Freq: Once | ORAL | Status: AC
  Administered 2015-09-25: 1000 mg via ORAL
  Filled 2015-09-25: qty 2

## 2015-09-25 MED ORDER — TRAZODONE HCL 50 MG PO TABS
25.0000 mg | ORAL_TABLET | Freq: Once | ORAL | Status: AC
  Administered 2015-09-26: 25 mg via ORAL
  Filled 2015-09-25: qty 1

## 2015-09-25 MED ORDER — IPRATROPIUM-ALBUTEROL 0.5-2.5 (3) MG/3ML IN SOLN: 3.0000 mL | RESPIRATORY_TRACT | Status: DC | PRN

## 2015-09-25 MED ORDER — TIOTROPIUM BROMIDE MONOHYDRATE 18 MCG IN CAPS
18.0000 ug | ORAL_CAPSULE | Freq: Every day | RESPIRATORY_TRACT | Status: DC
  Administered 2015-09-26 – 2015-10-02 (×7): 18 ug via RESPIRATORY_TRACT
  Filled 2015-09-25 (×2): qty 5

## 2015-09-25 MED ORDER — ENOXAPARIN SODIUM 40 MG/0.4ML ~~LOC~~ SOLN
40.0000 mg | SUBCUTANEOUS | Status: DC
  Administered 2015-09-26 – 2015-09-27 (×3): 40 mg via SUBCUTANEOUS
  Filled 2015-09-25 (×3): qty 0.4

## 2015-09-25 MED ORDER — IPRATROPIUM-ALBUTEROL 0.5-2.5 (3) MG/3ML IN SOLN
3.0000 mL | Freq: Four times a day (QID) | RESPIRATORY_TRACT | Status: DC
  Administered 2015-09-25 – 2015-09-28 (×11): 3 mL via RESPIRATORY_TRACT
  Filled 2015-09-25 (×12): qty 3

## 2015-09-25 MED ORDER — MOMETASONE FURO-FORMOTEROL FUM 100-5 MCG/ACT IN AERO
2.0000 | INHALATION_SPRAY | Freq: Two times a day (BID) | RESPIRATORY_TRACT | Status: DC
  Administered 2015-09-25 – 2015-10-02 (×13): 2 via RESPIRATORY_TRACT
  Filled 2015-09-25: qty 8.8

## 2015-09-25 MED ORDER — DEXTROSE 5 % IV SOLN
1.0000 g | INTRAVENOUS | Status: DC
  Administered 2015-09-26 – 2015-09-30 (×5): 1 g via INTRAVENOUS
  Filled 2015-09-25 (×5): qty 10

## 2015-09-25 MED ORDER — SODIUM CHLORIDE 0.9 % IV BOLUS (SEPSIS)
500.0000 mL | Freq: Once | INTRAVENOUS | Status: AC
  Administered 2015-09-25: 500 mL via INTRAVENOUS

## 2015-09-25 MED ORDER — SODIUM CHLORIDE 0.9 % IV BOLUS (SEPSIS): 1000.0000 mL | Freq: Once | INTRAVENOUS | Status: DC

## 2015-09-25 MED ORDER — DEXTROSE 5 % IV SOLN
2.0000 g | Freq: Once | INTRAVENOUS | Status: AC
  Administered 2015-09-25: 2 g via INTRAVENOUS
  Filled 2015-09-25: qty 2

## 2015-09-25 MED ORDER — SODIUM CHLORIDE 0.9 % IV BOLUS (SEPSIS): 500.0000 mL | Freq: Once | INTRAVENOUS | Status: DC

## 2015-09-25 MED ORDER — POTASSIUM CHLORIDE 10 MEQ/100ML IV SOLN
10.0000 meq | INTRAVENOUS | Status: AC
  Administered 2015-09-25 (×4): 10 meq via INTRAVENOUS
  Filled 2015-09-25 (×4): qty 100

## 2015-09-25 MED ORDER — AZITHROMYCIN 500 MG IV SOLR
500.0000 mg | Freq: Once | INTRAVENOUS | Status: AC
  Administered 2015-09-25: 500 mg via INTRAVENOUS
  Filled 2015-09-25: qty 500

## 2015-09-25 MED ORDER — INSULIN ASPART 100 UNIT/ML ~~LOC~~ SOLN
0.0000 [IU] | SUBCUTANEOUS | Status: DC
  Administered 2015-09-25 (×2): 8 [IU] via SUBCUTANEOUS
  Administered 2015-09-25: 11 [IU] via SUBCUTANEOUS
  Administered 2015-09-26: 3 [IU] via SUBCUTANEOUS
  Administered 2015-09-26: 5 [IU] via SUBCUTANEOUS
  Administered 2015-09-26: 3 [IU] via SUBCUTANEOUS
  Administered 2015-09-26 (×3): 5 [IU] via SUBCUTANEOUS
  Administered 2015-09-27 (×2): 3 [IU] via SUBCUTANEOUS
  Administered 2015-09-27 – 2015-09-28 (×2): 2 [IU] via SUBCUTANEOUS
  Administered 2015-09-28 (×3): 3 [IU] via SUBCUTANEOUS
  Administered 2015-09-28 (×2): 5 [IU] via SUBCUTANEOUS
  Administered 2015-09-29 (×3): 2 [IU] via SUBCUTANEOUS
  Administered 2015-09-29: 5 [IU] via SUBCUTANEOUS
  Administered 2015-09-29 – 2015-09-30 (×2): 3 [IU] via SUBCUTANEOUS
  Administered 2015-09-30: 2 [IU] via SUBCUTANEOUS
  Administered 2015-09-30: 8 [IU] via SUBCUTANEOUS
  Administered 2015-09-30: 5 [IU] via SUBCUTANEOUS
  Administered 2015-09-30 – 2015-10-02 (×4): 3 [IU] via SUBCUTANEOUS
  Administered 2015-10-02: 2 [IU] via SUBCUTANEOUS
  Administered 2015-10-02: 5 [IU] via SUBCUTANEOUS
  Filled 2015-09-25 (×3): qty 1

## 2015-09-25 MED ORDER — SODIUM CHLORIDE 0.9 % IV BOLUS (SEPSIS)
2000.0000 mL | Freq: Once | INTRAVENOUS | Status: AC
  Administered 2015-09-25: 2000 mL via INTRAVENOUS

## 2015-09-25 MED ORDER — SODIUM CHLORIDE 0.9 % IV SOLN: INTRAVENOUS | Status: DC

## 2015-09-25 MED ORDER — AZITHROMYCIN 500 MG IV SOLR
500.0000 mg | INTRAVENOUS | Status: DC
  Administered 2015-09-26 – 2015-09-30 (×5): 500 mg via INTRAVENOUS
  Filled 2015-09-25 (×5): qty 500

## 2015-09-25 MED ORDER — POTASSIUM CHLORIDE IN NACL 20-0.9 MEQ/L-% IV SOLN
INTRAVENOUS | Status: DC
  Administered 2015-09-25 – 2015-09-26 (×2): via INTRAVENOUS
  Filled 2015-09-25 (×6): qty 1000

## 2015-09-25 MED ORDER — MAGNESIUM SULFATE 2 GM/50ML IV SOLN
2.0000 g | Freq: Once | INTRAVENOUS | Status: AC
  Administered 2015-09-25: 2 g via INTRAVENOUS
  Filled 2015-09-25: qty 50

## 2015-09-25 NOTE — ED Notes (Signed)
Pt brought to ED by [facility] from home for respiratory distress, weakness, fever and generalized pain, pt had a HR 130-140 on EMS arrival. T-99, R 25, HR 130-140, BP- 150/79, SPO2 92% on 4L [place], CBG 178. 10 Albuterol given, 0.5 Atrovent, 125 Solumedrol given by [facility] PTA.

## 2015-09-25 NOTE — ED Notes (Signed)
Dr Rennis Harding notified of lactic acid being 3

## 2015-09-25 NOTE — ED Notes (Signed)
Admitting at bedside 

## 2015-09-25 NOTE — ED Notes (Signed)
Critical Lactic-2.4-Ellis made aware.

## 2015-09-25 NOTE — ED Notes (Signed)
RT at bedside placing bipap per MD Konrad Dolores.

## 2015-09-25 NOTE — Progress Notes (Addendum)
PCT 1.08- tolerating BiPAP and appears slightly more alert. FU ABG pending  1205 pm: Repeat ABG with decreased but still elevated pCO2 and pH has increased to 7.33, pO2 up to 147 so will continue BiPAP with RT adjudting and repeat ABG at 1600  Repeat lactic acid 3.0 so will continue to cycle-CBG 328 so will start Lantus 5 units daily to replace preadmission Metformin  1419 PM: Follow up lactic acid finally decreased to 2.2- have scheduled 1 more which is due in 3 hrs  Junious Silk, ANP

## 2015-09-25 NOTE — ED Notes (Signed)
Attempted report 

## 2015-09-25 NOTE — Plan of Care (Signed)
70 year old female with known history of COPD presents with shortness of breath and is found to have bilateral expiratory wheeze as per the ER physician. Lab works also shows significant leukocytosis. Patient has been placed on antibiotics and nebulizer treatment and steroids and admitted to stepdown unit for acute exacerbation of COPD.  [name].

## 2015-09-25 NOTE — ED Provider Notes (Signed)
CSN: [id number]     Arrival date & time 09/25/15  0405 History   First MD Initiated Contact with Patient 09/25/15 0431     Chief Complaint  Patient presents with  . Respiratory Distress     (Consider location/radiation/quality/duration/timing/severity/associated sxs/prior Treatment) HPI   [name] is a 70 y.o. female with PMH of COPD and sleep apnea, here with worsening SOB.  She was on her home O2 3LNC and was 85% per EMS.  She was wheezing and was given albuterol 15mg , ipratropoium 1mg  and solumedrol.  Patient complains of pain all over her body, which she states is her normal arthritis pain.  She has subjective fevers as well.  She has been using her home albuterol without any relief.  She denies sick contacts.  There are no further complaints.   10 Systems reviewed and are negative for acute change except as noted in the HPI.    Past Medical History  Diagnosis Date  . Cerebral aneurysm   . Hypertension   . Atrial fibrillation (HCC)   . Arthritis   . Anemia   . Asthma   . Aneurysm (HCC)     x2  . Complication of anesthesia     Has woken up during surgery before  . Brain aneurysm     have a "giant aneurysm, Dr. has stented and coiled in past x 4  . Shortness of breath   . Bronchitis     history of  . Sleep apnea     does not use cpap  . Diabetes mellitus     metformin  . Adrenal disorder, other     "adrenal Incidentaloma"  . Urinary tract infection     history of  . GERD (gastroesophageal reflux disease)     omeprazole  . Rheumatoid arthritis(714.0)     has had for approx. 40years, sees Dr.  . Depression     due to loss of spouse 6 years ago  . COPD (chronic obstructive pulmonary disease) (HCC)     Dr. Bedelia Person, uses 2.5L of o2 as needed  . Emphysema of lung (HCC)     requested anethesia consult   . Obesity   . Acute respiratory failure (HCC) 02/05/2017   Past Surgical History  Procedure Laterality Date  . Tubal ligation    . Hand  surgery      bilateral  . Hand surgery      x 2  . Brain surgery      coiling and stenting  . Mastectomy, partial  02/28/2011    Procedure: MASTECTOMY PARTIAL;  Surgeon: 02/07/2017, MD;  Location: [facility] OR;  Service: General;  Laterality: Right;  RIGHT PARTIAL MASTECTOMY  WITH NEEDLE LOCALIZATION  . Breast surgery  02/28/11    right partial mastectomy    Family History  Problem Relation Age of Onset  . Heart disease Mother   . Cancer Father     lung  . Cancer Sister     breast   Social History  Substance Use Topics  . Smoking status: Former Smoker -- 1.50 packs/day for 40 years    Types: Cigarettes  . Smokeless tobacco: Never Used     Comment: quit 2006  . Alcohol Use: 0.6 oz/week    1 Shots of liquor per week   OB History    No data available     Review of Systems    Allergies  Gold-containing drug products and Tape  Home Medications  Prior to Admission medications   Medication Sig Start Date End Date Taking? Authorizing Provider  acetaminophen (TYLENOL) 500 MG tablet Take 1 tablet (500 mg total) by mouth 3 (three) times daily. 02/09/15   Ripudeep Jenna Luo, MD  albuterol (PROAIR HFA) 108 (90 BASE) MCG/ACT inhaler Inhale 2 puffs into the lungs every 6 (six) hours as needed. For shortness of breath    Historical Provider, MD  aspirin EC 81 MG tablet Take 81 mg by mouth daily.    Historical Provider, MD  atenolol (TENORMIN) 25 MG tablet Take 25 mg by mouth daily. 12/01/14   Historical Provider, MD  budesonide-formoterol (SYMBICORT) 80-4.5 MCG/ACT inhaler Inhale 2 puffs into the lungs 2 (two) times daily.    Historical Provider, MD  Calcium 600-200 MG-UNIT tablet Take 1 tablet by mouth daily.    Historical Provider, MD  chlorpheniramine-HYDROcodone (TUSSIONEX) 10-8 MG/5ML SUER Take 5 mLs by mouth every 12 (twelve) hours as needed for cough. 02/09/15   Ripudeep Jenna Luo, MD  ferrous sulfate 325 (65 FE) MG tablet Take 325 mg by mouth daily with breakfast.    Historical  Provider, MD  furosemide (LASIX) 20 MG tablet Take 20 mg by mouth daily.    Historical Provider, MD  ibandronate (BONIVA) 150 MG tablet Take 150 mg by mouth every 30 (thirty) days. Take in the morning with a full glass of water, on an empty stomach, and do not take anything else by mouth or lie down for the next 30 min.    Historical Provider, MD  ipratropium-albuterol (DUONEB) 0.5-2.5 (3) MG/3ML SOLN Take 3 mLs by nebulization 3 (three) times daily. 02/09/15   Ripudeep Jenna Luo, MD  levofloxacin (LEVAQUIN) 750 MG tablet Take 1 tablet (750 mg total) by mouth daily. X 7days 02/09/15   Ripudeep K Rai, MD  lidocaine (LIDODERM) 5 % Place 1 patch onto the skin daily. Remove & Discard patch within 12 hours or as directed by MD 02/09/15   Ripudeep Jenna Luo, MD  LORazepam (ATIVAN) 0.5 MG tablet Take 1 tablet (0.5 mg total) by mouth every 8 (eight) hours as needed for anxiety. 02/09/15   Ripudeep Jenna Luo, MD  metFORMIN (GLUCOPHAGE) 500 MG tablet Take 500 mg by mouth 2 (two) times daily with a meal.      Historical Provider, MD  methocarbamol (ROBAXIN) 750 MG tablet Take 1 tablet (750 mg total) by mouth 3 (three) times daily. 02/09/15   Ripudeep Jenna Luo, MD  Multiple Vitamins-Minerals (CENTRUM SILVER ADULT 50+ PO) Take 1 tablet by mouth daily.    Historical Provider, MD  omeprazole (PRILOSEC) 20 MG capsule Take 20 mg by mouth daily.      Historical Provider, MD  oxybutynin (DITROPAN) 5 MG tablet Take 0.5 tablets (2.5 mg total) by mouth 2 (two) times daily. 02/09/15   Ripudeep Jenna Luo, MD  oxyCODONE (OXY IR/ROXICODONE) 5 MG immediate release tablet Take 1-2 tablets (5-10 mg total) by mouth every 4 (four) hours as needed for severe pain or breakthrough pain. 02/09/15   Ripudeep Jenna Luo, MD  OxyCODONE (OXYCONTIN) 10 mg T12A 12 hr tablet Take 1 tablet (10 mg total) by mouth every 12 (twelve) hours. 02/09/15   Ripudeep Jenna Luo, MD  OXYGEN Inhale 2.5 L into the lungs continuous.    Historical Provider, MD  polyethylene glycol  (MIRALAX / GLYCOLAX) packet Take 17 g by mouth daily. 02/09/15   Ripudeep Jenna Luo, MD  predniSONE (DELTASONE) 10 MG tablet Prednisone dosing: Take  Prednisone 40mg  (4  tabs) x 2 days, then taper to 30mg  (3 tabs) x 3 days, then 20mg  (2 tabs) x 3days, then resume maintenance dose of 10mg  (1 tab) daily. 02/10/15   Ripudeep , MD  senna-docusate (SENOKOT-S) 8.6-50 MG tablet Take 2 tablets by mouth at bedtime. 02/09/15   Ripudeep Jenna Luo, MD  spironolactone-hydrochlorothiazide (ALDACTAZIDE) 25-25 MG per tablet Take 1 tablet by mouth daily.      Historical Provider, MD  tiotropium (SPIRIVA) 18 MCG inhalation capsule Place 18 mcg into inhaler and inhale daily.     Historical Provider, MD  traMADol (ULTRAM) 50 MG tablet Take 1 tablet (50 mg total) by mouth every 8 (eight) hours as needed for moderate pain. 02/09/15   Ripudeep 02/11/15, MD  traZODone (DESYREL) 50 MG tablet Take 50 mg by mouth at bedtime.    Historical Provider, MD   BP 140/66 mmHg  Pulse 137  Temp(Src) 100.5 F (38.1 C) (Rectal)  Resp 32  Ht 5\' 7"  (1.702 m)  Wt 267 lb (121.11 kg)  BMI 41.81 kg/m2  SpO2 99% Physical Exam  Constitutional: She is oriented to person, place, and time. She appears well-developed and well-nourished. She appears distressed.  HENT:  Head: Normocephalic and atraumatic.  Nose: Nose normal.  Mouth/Throat: Oropharynx is clear and moist. No oropharyngeal exudate.  Eyes: Conjunctivae and EOM are normal. Pupils are equal, round, and reactive to light. No scleral icterus.  Neck: Normal range of motion. Neck supple. No JVD present. No tracheal deviation present. No thyromegaly present.  Cardiovascular: Regular rhythm and normal heart sounds.  Exam reveals no gallop and no friction rub.   No murmur heard. tachycardia  Pulmonary/Chest: She is in respiratory distress. She has wheezes. She exhibits no tenderness.  tachypnea  Abdominal: Soft. Bowel sounds are normal. She exhibits no distension and no mass. There is no  tenderness. There is no rebound and no guarding.  Musculoskeletal: Normal range of motion. She exhibits no edema or tenderness.  Lymphadenopathy:    She has no cervical adenopathy.  Neurological: She is alert and oriented to person, place, and time. No cranial nerve deficit. She exhibits normal muscle tone.  Skin: Skin is warm and dry. No rash noted. No erythema. No pallor.  Nursing note and vitals reviewed.   ED Course  Procedures (including critical care time) Labs Review Labs Reviewed  CBC WITH DIFFERENTIAL/PLATELET - Abnormal; Notable for the following:    WBC 26.3 (*)    Hemoglobin 11.9 (*)    MCH 25.8 (*)    Platelets 414 (*)    Neutro Abs 22.9 (*)    Monocytes Absolute 1.6 (*)    All other components within normal limits  BASIC METABOLIC PANEL - Abnormal; Notable for the following:    Sodium 126 (*)    Chloride 78 (*)    Glucose, Bld 205 (*)    BUN 24 (*)    Creatinine, Ser 1.70 (*)    GFR calc non Af Amer 30 (*)    GFR calc Af Amer 34 (*)    Anion gap 18 (*)    All other components within normal limits  I-STAT ARTERIAL BLOOD GAS, ED - Abnormal; Notable for the following:    pCO2 arterial 48.9 (*)    Bicarbonate 29.2 (*)    Acid-Base Excess 4.0 (*)    All other components within normal limits  CULTURE, BLOOD (ROUTINE X 2)  CULTURE, BLOOD (ROUTINE X 2)  BRAIN NATRIURETIC PEPTIDE  BLOOD GAS, ARTERIAL  I-STAT  CG4 LACTIC ACID, ED  Rosezena Sensor, ED    Imaging Review Dg Chest 2 View  09/25/2015  CLINICAL DATA:  I initial evaluation for acute shortness of breath. EXAM: CHEST  2 VIEW COMPARISON:  Prior study from 02/20/2015. FINDINGS: Mild accentuation of the cardiac silhouette, likely related to AP technique. Mediastinal silhouette within normal limits. Atheromatous plaque within the intrathoracic aorta. Lungs are normally inflated. Changes related emphysema present. Mild left basilar subsegmental atelectasis/ scar noted. Irregular biapical pleural thickening, stable.  No focal infiltrates. Hazy opacity overlying the lower spine on lateral projection favored to be related to overlying soft tissue attenuation. No pulmonary edema or pleural effusion. No pneumothorax. Remotely healed left-sided rib fractures. No acute osseous abnormality. Scoliosis. Osteopenia. IMPRESSION: 1. Emphysema.  No other active cardiopulmonary disease. 2. Atherosclerosis. Electronically Signed   By: Rise Mu M.D.   On: 09/25/2015 05:26   I have personally reviewed and evaluated these images and lab results as part of my medical decision-making.   EKG Interpretation None      MDM   Final diagnoses:  None    Patient presents to the ED for SOB.  She was given magnesium as well for COPD treatment.  She continues to have wheezing.  WBC is 26 and CXR reveals possibly opacity. Will treat in this setting with ceftriaxone and azithromycin.  She was admitted to stepdown with Dr. Toniann Fail for further care.  Blood gas obtained and CO2 is 49.   CRITICAL CARE Performed by: Tomasita Crumble   Total critical care time: 40 minutes - respiratory distress  Critical care time was exclusive of separately billable procedures and treating other patients.  Critical care was necessary to treat or prevent imminent or life-threatening deterioration.  Critical care was time spent personally by me on the following activities: development of treatment plan with patient and/or surrogate as well as nursing, discussions with consultants, evaluation of patient's response to treatment, examination of patient, obtaining history from patient or surrogate, ordering and performing treatments and interventions, ordering and review of laboratory studies, ordering and review of radiographic studies, pulse oximetry and re-evaluation of patient's condition.     Tomasita Crumble, MD 09/25/15 [phone]

## 2015-09-25 NOTE — H&P (Signed)
History and Physical    Jaime CABBAGESTALK SHU:[id number] DOB: 22-Dec-1945 DOA: 09/25/2015   PCP: [name], MD   Patient coming from/Resides with: Private residence  Chief Complaint: Increasing shortness of breath with associated weakness and fever  HPI: [name] is a 70 y.o. female with medical history significant for COPD on oxygen 3 L/m, sleep apnea patient reports not on CPAP, hypertension, diabetes on metformin, obesity, iron deficiency anemia who presented to the ER with reports of shortness of breath, weakness, fever and generalized pain. EMS was called to the patient's home and they found the patient with a heart rate between 131 40 bpm a low-grade temperature of 99 with a respiratory rate of 25. She was hypertensive with a BP 150/79 and pulse oximetry was 92% on 4 L nasal cannula oxygen. She was given albuterol and Atrovent nebulizers was 125 mg of Solu-Medrol prior to arrival to this facility.  Upon my evaluation of the patient she was quite lethargic and difficult to arouse. Because of this history is limited. Patient reports she has been sick for about 2 weeks but did not seek any medical attention from her primary care physician. She reports coughing up thick sputum but was unable to truly characterize the color of the sputum. She denied nausea or vomiting or GI symptoms. She denied genitourinary symptoms.  ED Course:  Initial vital signs: Rectal temp 100.5-BP 140/66-pulse 137-respirations 32-100 percent nonrebreather mask with 99% saturations Repeat vital signs: BP 144/64-pulse 108-respirations 28-O2 sats 89% with patient noticed to be mouth breathing with nasal cannula in place 2 view chest x-ray: COPD without any infiltrates or edema Lab data: Sodium 126, potassium 3.6, chloride 78, BUN 24, creatinine 1.7, glucose 205, anion gap 18, BNP 95, troponin point-of-care 0.00, lactic acid 1.69, WBC 26,300 with neutrophils 87% and absolute neutrophils 23%, hemoglobin 11.9, platelets  414,000 ABG on 4 L oxygen: PH 7.39-PCO2 48.9-PO2 88-bicarbonate 29.2-T CO2 31-ABE 4.0 Tylenol 1000 mg by mouth 1 Rocephin 2 g IV 1 Magnesium sulfate 2 g IV 1  Review of Systems:  In addition to the HPI above,  No Headache, changes with Vision or hearing, new weakness, tingling, numbness in any extremity, No problems swallowing food or Liquids, indigestion/reflux No Chest pain, palpitations, orthopnea  No Abdominal pain, N/V; no melena or hematochezia, no dark tarry stools, Bowel movements are regular, No dysuria, hematuria or flank pain No new skin rashes, lesions, masses or bruises, No new joints pains-aches No recent weight gain or loss No polyuria, polydypsia or polyphagia,   Past Medical History  Diagnosis Date  . Cerebral aneurysm   . Hypertension   . Atrial fibrillation (HCC)   . Arthritis   . Anemia   . Asthma   . Aneurysm (HCC)     x2  . Complication of anesthesia     Has woken up during surgery before  . Brain aneurysm     have a "giant aneurysm, Dr. Bedelia Person has stented and coiled in past x 4  . Shortness of breath   . Bronchitis     history of  . Sleep apnea     does not use cpap  . Diabetes mellitus     metformin  . Adrenal disorder, other     "adrenal Incidentaloma"  . Urinary tract infection     history of  . GERD (gastroesophageal reflux disease)     omeprazole  . Rheumatoid arthritis(714.0)     has had for approx. 40years, sees Dr. [name]  .  Depression     due to loss of spouse 6 years ago  . COPD (chronic obstructive pulmonary disease) (HCC)     Dr. [name], uses 2.5L of o2 as needed  . Emphysema of lung (HCC)     requested anethesia consult   . Obesity   . Acute respiratory failure (HCC) 02/05/2017    Past Surgical History  Procedure Laterality Date  . Tubal ligation    . Hand surgery      bilateral  . Hand surgery      x 2  . Brain surgery      coiling and stenting  . Mastectomy, partial  02/28/2011    Procedure: MASTECTOMY  PARTIAL;  Surgeon: Ernestene Mention, MD;  Location: [facility] OR;  Service: General;  Laterality: Right;  RIGHT PARTIAL MASTECTOMY  WITH NEEDLE LOCALIZATION  . Breast surgery  02/28/11    right partial mastectomy      reports that she has quit smoking. Her smoking use included Cigarettes. She has a 60 pack-year smoking history. She has never used smokeless tobacco. She reports that she drinks about 0.6 oz of alcohol per week. She reports that she does not use illicit drugs.  Mobility: Without assistive devices Work history: Did not obtain   Allergies  Allergen Reactions  . Gold-Containing Drug Products Other (See Comments)    Blisters   . Tape Dermatitis    Family History  Problem Relation Age of Onset  . Heart disease Mother   . Cancer Father     lung  . Cancer Sister     breast     Prior to Admission medications   Medication Sig Start Date End Date Taking? Authorizing Provider  acetaminophen (TYLENOL) 500 MG tablet Take 1 tablet (500 mg total) by mouth 3 (three) times daily. 02/09/15  Yes Ripudeep Jenna Luo, MD  albuterol (PROAIR HFA) 108 (90 BASE) MCG/ACT inhaler Inhale 2 puffs into the lungs every 6 (six) hours as needed. For shortness of breath   Yes Historical Provider, MD  aspirin EC 81 MG tablet Take 81 mg by mouth daily.   Yes Historical Provider, MD  atenolol (TENORMIN) 25 MG tablet Take 25 mg by mouth daily. 12/01/14  Yes Historical Provider, MD  budesonide-formoterol (SYMBICORT) 80-4.5 MCG/ACT inhaler Inhale 2 puffs into the lungs 2 (two) times daily. Reported on 09/25/2015   Yes Historical Provider, MD  Calcium 600-200 MG-UNIT tablet Take 1 tablet by mouth daily.   Yes Historical Provider, MD  ferrous sulfate 325 (65 FE) MG tablet Take 325 mg by mouth daily with breakfast.   Yes Historical Provider, MD  furosemide (LASIX) 20 MG tablet Take 20 mg by mouth daily.   Yes Historical Provider, MD  ibandronate (BONIVA) 150 MG tablet Take 150 mg by mouth every 30 (thirty) days. Take in  the morning with a full glass of water, on an empty stomach, and do not take anything else by mouth or lie down for the next 30 min.   Yes Historical Provider, MD  ipratropium-albuterol (DUONEB) 0.5-2.5 (3) MG/3ML SOLN Take 3 mLs by nebulization 3 (three) times daily. 02/09/15  Yes Ripudeep Jenna Luo, MD  LORazepam (ATIVAN) 0.5 MG tablet Take 1 tablet (0.5 mg total) by mouth every 8 (eight) hours as needed for anxiety. 02/09/15  Yes Ripudeep Jenna Luo, MD  metFORMIN (GLUCOPHAGE) 500 MG tablet Take 500 mg by mouth 2 (two) times daily with a meal.     Yes Historical Provider, MD  Multiple Vitamins-Minerals (  CENTRUM SILVER ADULT 50+ PO) Take 1 tablet by mouth daily.   Yes Historical Provider, MD  omeprazole (PRILOSEC) 20 MG capsule Take 20 mg by mouth daily.     Yes Historical Provider, MD  oxybutynin (DITROPAN) 5 MG tablet Take 0.5 tablets (2.5 mg total) by mouth 2 (two) times daily. 02/09/15  Yes Ripudeep Jenna Luo, MD  OXYGEN Inhale 2.5 L into the lungs continuous.   Yes Historical Provider, MD  polyethylene glycol (MIRALAX / GLYCOLAX) packet Take 17 g by mouth daily. 02/09/15  Yes Ripudeep Jenna Luo, MD  senna-docusate (SENOKOT-S) 8.6-50 MG tablet Take 2 tablets by mouth at bedtime. 02/09/15  Yes Ripudeep Jenna Luo, MD  spironolactone-hydrochlorothiazide (ALDACTAZIDE) 25-25 MG per tablet Take 1 tablet by mouth daily.     Yes Historical Provider, MD  tiotropium (SPIRIVA) 18 MCG inhalation capsule Place 18 mcg into inhaler and inhale daily.    Yes Historical Provider, MD  traZODone (DESYREL) 50 MG tablet Take 50 mg by mouth at bedtime.   Yes Historical Provider, MD    Physical Exam: Filed Vitals:   09/25/15 0630 09/25/15 0645 09/25/15 0700 09/25/15 0730  BP: 149/56 106/69 149/66 144/64  Pulse: 114 112 110 108  Temp:      TempSrc:      Resp: Height:      Weight:      SpO2: 90% 92% 90% 89%      Constitutional: Lethargic and difficult to awaken Eyes: PERRL, lids and conjunctivae normal ENMT:  Mucous membranes are dry. Posterior pharynx clear of any exudate or lesions.Normal dentition.  Neck: normal, supple, no masses, no thyromegaly Respiratory: Slightly diminished especially in the bases with a few scattered expiratory wheezes, no crackles. Slightly tachypneic respiratory rate but nonlabored effort. Minimal accessory muscle use. 4 L oxygen Cardiovascular: Regular but tachycardic rate and rhythm, no murmurs / rubs / gallops. No extremity edema. 2+ pedal pulses. No carotid bruits.  Abdomen: no tenderness, no masses palpated. No hepatosplenomegaly. Bowel sounds positive are hypoactive.  Musculoskeletal: no clubbing / cyanosis. No joint deformity upper and lower extremities. Good ROM, no contractures. Normal muscle tone.  Skin: no rashes, lesions, ulcers. No induration Neurologic: CN 2-12 grossly intact. Sensation appears to be grossly intact, . Unable to accurately test strength secondary to altered mentation but patient noted to be moving all 4 extremities spontaneously.  Psychiatric: Very lethargic and difficult to awaken and having difficulty answering historical questions   Labs on Admission: I have personally reviewed following labs and imaging studies  CBC:  Recent Labs Lab 09/25/15 0435  WBC 26.3*  NEUTROABS 22.9*  HGB 11.9*  HCT 38.1  MCV 82.6  PLT 414*   Basic Metabolic Panel:  Recent Labs Lab 09/25/15 0435  NA 126*  K 3.6  CL 78*  CO2 30  GLUCOSE 205*  BUN 24*  CREATININE 1.70*  CALCIUM 9.2   GFR: Estimated Creatinine Clearance: 42.1 mL/min (by C-G formula based on Cr of 1.7). Liver Function Tests: No results for input(s): AST, ALT, ALKPHOS, BILITOT, PROT, ALBUMIN in the last 168 hours. No results for input(s): LIPASE, AMYLASE in the last 168 hours. No results for input(s): AMMONIA in the last 168 hours. Coagulation Profile: No results for input(s): INR, PROTIME in the last 168 hours. Cardiac Enzymes: No results for input(s): CKTOTAL, CKMB,  CKMBINDEX, TROPONINI in the last 168 hours. BNP (last 3 results) No results for input(s): PROBNP in the last 8760 hours. HbA1C: No results for input(s):  HGBA1C in the last 72 hours. CBG:  Recent Labs Lab 09/25/15 0742  GLUCAP 293*   Lipid Profile: No results for input(s): CHOL, HDL, LDLCALC, TRIG, CHOLHDL, LDLDIRECT in the last 72 hours. Thyroid Function Tests: No results for input(s): TSH, T4TOTAL, FREET4, T3FREE, THYROIDAB in the last 72 hours. Anemia Panel: No results for input(s): VITAMINB12, FOLATE, FERRITIN, TIBC, IRON, RETICCTPCT in the last 72 hours. Urine analysis:    Component Value Date/Time   COLORURINE YELLOW 02/06/2015 1906   APPEARANCEUR CLEAR 02/06/2015 1906   LABSPEC 1.015 02/06/2015 1906   PHURINE 5.0 02/06/2015 1906   GLUCOSEU NEGATIVE 02/06/2015 1906   HGBUR NEGATIVE 02/06/2015 1906   BILIRUBINUR NEGATIVE 02/06/2015 1906   KETONESUR NEGATIVE 02/06/2015 1906   PROTEINUR NEGATIVE 02/06/2015 1906   UROBILINOGEN 0.2 02/06/2015 1906   NITRITE NEGATIVE 02/06/2015 1906   LEUKOCYTESUR NEGATIVE 02/06/2015 1906   Sepsis Labs: @LABRCNTIP (procalcitonin:4,lacticidven:4) )No results found for this or any previous visit (from the past 240 hour(s)).   Radiological Exams on Admission: Dg Chest 2 View  09/25/2015  CLINICAL DATA:  I initial evaluation for acute shortness of breath. EXAM: CHEST  2 VIEW COMPARISON:  Prior study from 02/20/2015. FINDINGS: Mild accentuation of the cardiac silhouette, likely related to AP technique. Mediastinal silhouette within normal limits. Atheromatous plaque within the intrathoracic aorta. Lungs are normally inflated. Changes related emphysema present. Mild left basilar subsegmental atelectasis/ scar noted. Irregular biapical pleural thickening, stable. No focal infiltrates. Hazy opacity overlying the lower spine on lateral projection favored to be related to overlying soft tissue attenuation. No pulmonary edema or pleural effusion. No  pneumothorax. Remotely healed left-sided rib fractures. No acute osseous abnormality. Scoliosis. Osteopenia. IMPRESSION: 1. Emphysema.  No other active cardiopulmonary disease. 2. Atherosclerosis. Electronically Signed   By: 13/04/2014 M.D.   On: 09/25/2015 05:26    EKG: (Independently reviewed) Sinus tachycardia with possible subtle ST depression in the inferior leads but this interpretation is limited by undulating baseline. QTC 420 6/92  Assessment/Plan Principal Problem:   Sepsis/  Increased anion gap metabolic acidosis/ Leukocytosis -Patient presents with respiratory failure progressive over 2 weeks associated with fever and generalized weakness; clinical evaluation in the ER reveals significant leukocytosis (CBC obtained after received 1 dose Solu-Medrol en route to hospital) and continued difficulties with altered mentation as well as an elevated anion gap without evidence of DKA -Differential includes pneumonia/bronchitis versus possible UTI (urinalysis + cx pending) -Initial serum lactate normal but will repeat -Patient mildly dehydrated and on metformin prior to arrival so this could explain abnormal anion gap-check CK -Normal saline bolus 500 mL followed by normal saline at 100 mL/h **Repeat lactic acid now up to 2.6-cycle q 3 hrs x 2 more collections **ABG with respiratory acidosis and worsening hypoxia so BiPAP ordered w/ FU ABG in 2 hours and 2nd NS bolus ordered to be followed by a 1 liter bolus (ie tx as sepsis)  Active Problems:   Acute on chronic respiratory failure with hypoxia /Acute bronchitis with COPD  -Empiric Rocephin and Zithromax -DuoNeb scheduled every 4 hours and every 2 hours prn -Follow-up on blood and urine cultures -Supportive care with oxygen -In setting of continued lethargy and tachypnea repeat ABG stat    Diabetes mellitus type II, uncontrolled  -Hold metformin -Provide SSI -Check hemoglobin A1c -Repeat BMET follow anion gap noting elevation  in gap disproportionate to degree of hyperglycemia    Hypertension -Currently controlled -In setting of lethargy holding oral medications including Tenormin -Holding diuretics secondary to dehydration  and hyponatremia -If needed can utilize IV beta blockers    Acute hyponatremia -Patient on Lasix as well as thiazide diuretics at home - currently on hold -Only minimal hyperglycemia so doubt pseudohyponatremia    OSA (obstructive sleep apnea)/Obesity -Patient denied use of CPAP at home    Iron deficiency anemia -On iron supplementation at home      DVT prophylaxis: Lovenox Code Status: Full code Family Communication: No family at bedside Disposition Plan: Anticipate discharge to preadmission home environment once medically stable Consults called: None Admission status: Inpatient/stepdown    ELLIS,ALLISON L. ANP-BC [facility] Pager [phone]   If 7PM-7AM, please contact night-coverage [email or web address] Password [email or web address]  09/25/2015, 7:57 AM

## 2015-09-25 NOTE — Progress Notes (Signed)
Bipap not needed at this time. Pt on 5L N/C eating and in no distress. Bipap order from ED admission.

## 2015-09-25 NOTE — Care Management Note (Signed)
Case Management Note  Patient Details  Name: [name] MRN: [id number] Date of Birth: [birth year]  Subjective/Objective:                  70 y.o. female with PMH of COPD and sleep apnea, here with worsening SOB./ Home alone.  Action/Plan: Follow for disposition needs.   Expected Discharge Date:  09/28/15               Expected Discharge Plan:  Home w Home Health Services  In-House Referral:     Discharge planning Services  CM Consult  Post Acute Care Choice:    Choice offered to:     DME Arranged:    DME Agency:     HH Arranged:    HH Agency:     Status of Service:  In process, will continue to follow  Medicare Important Message Given:    Date Medicare IM Given:    Medicare IM give by:    Date Additional Medicare IM Given:    Additional Medicare Important Message give by:     If discussed at Long Length of Stay Meetings, dates discussed:    Additional Comments:  Oletta Cohn, RN 09/25/2015, 12:00 PM

## 2015-09-25 NOTE — Progress Notes (Signed)
Pt placed on BIPAP per MD order. Pt tol very well. Will cont to monitor and obtain ABG after 2 hours.    09/25/15 0834  BiPAP/CPAP/SIPAP  BiPAP/CPAP/SIPAP Pt Type Adult  Mask Type Full face mask  Set Rate 10 breaths/min  Respiratory Rate 24 breaths/min  Oxygen Percent 70 %  Flow Rate 0 lpm  BiPAP/CPAP/SIPAP BiPAP  Patient Home Equipment No  Auto Titrate No  Press High Alarm 25 cmH2O  Press Low Alarm 5 cmH2O  Nasal massage performed No (comment)  BiPAP/CPAP /SiPAP Vitals  Pulse Rate 101  Resp 24  BP 145/66 mmHg  SpO2 100 %

## 2015-09-25 NOTE — ED Notes (Signed)
Critical Lactic acid 2.6, Rennis Harding NP made aware.

## 2015-09-25 NOTE — ED Notes (Signed)
resp in and bipap off, pt on and off bedpan cbg covered ,  Pt aaox3. To be on venti mask after breathing tx

## 2015-09-26 ENCOUNTER — Inpatient Hospital Stay (HOSPITAL_COMMUNITY): Payer: Medicare Other

## 2015-09-26 DIAGNOSIS — E118 Type 2 diabetes mellitus with unspecified complications: Secondary | ICD-10-CM

## 2015-09-26 DIAGNOSIS — I10 Essential (primary) hypertension: Secondary | ICD-10-CM

## 2015-09-26 DIAGNOSIS — E1165 Type 2 diabetes mellitus with hyperglycemia: Secondary | ICD-10-CM

## 2015-09-26 DIAGNOSIS — J9621 Acute and chronic respiratory failure with hypoxia: Secondary | ICD-10-CM

## 2015-09-26 LAB — HEMOGLOBIN A1C
HEMOGLOBIN A1C: 7.7 % — AB (ref 4.8–5.6)
MEAN PLASMA GLUCOSE: 174 mg/dL

## 2015-09-26 LAB — BASIC METABOLIC PANEL
Anion gap: 9 (ref 5–15)
BUN: 13 mg/dL (ref 6–20)
CO2: 31 mmol/L (ref 22–32)
CREATININE: 1.05 mg/dL — AB (ref 0.44–1.00)
Calcium: 8.5 mg/dL — ABNORMAL LOW (ref 8.9–10.3)
Chloride: 95 mmol/L — ABNORMAL LOW (ref 101–111)
GFR calc Af Amer: >60 mL/min (ref >60)
GFR, EST NON AFRICAN AMERICAN: 53 mL/min — AB (ref >60)
Glucose, Bld: 112 mg/dL — ABNORMAL HIGH (ref 65–99)
Potassium: 4.7 mmol/L (ref 3.5–5.1)
Sodium: 135 mmol/L (ref 135–145)

## 2015-09-26 LAB — GLUCOSE, CAPILLARY
GLUCOSE-CAPILLARY: 106 mg/dL — AB (ref 65–99)
GLUCOSE-CAPILLARY: 160 mg/dL — AB (ref 65–99)
GLUCOSE-CAPILLARY: 169 mg/dL — AB (ref 65–99)
GLUCOSE-CAPILLARY: 227 mg/dL — AB (ref 65–99)
GLUCOSE-CAPILLARY: 237 mg/dL — AB (ref 65–99)
Glucose-Capillary: 249 mg/dL — ABNORMAL HIGH (ref 65–99)
Glucose-Capillary: 215 mg/dL — ABNORMAL HIGH (ref 65–99)

## 2015-09-26 LAB — CBC
HCT: 30.6 % — ABNORMAL LOW (ref 36.0–46.0)
Hemoglobin: 9.1 g/dL — ABNORMAL LOW (ref 12.0–15.0)
MCH: 25.6 pg — AB (ref 26.0–34.0)
MCHC: 29.7 g/dL — AB (ref 30.0–36.0)
MCV: 86.2 fL (ref 78.0–100.0)
PLATELETS: 350 10*3/uL (ref 150–400)
RBC: 3.55 MIL/uL — AB (ref 3.87–5.11)
RDW: 15.1 % (ref 11.5–15.5)
WBC: 24.7 10*3/uL — ABNORMAL HIGH (ref 4.0–10.5)

## 2015-09-26 LAB — IRON AND TIBC
IRON: 23 ug/dL — AB (ref 28–170)
SATURATION RATIOS: 10 % — AB (ref 10.4–31.8)
TIBC: 237 ug/dL — AB (ref 250–450)
UIBC: 214 ug/dL

## 2015-09-26 LAB — RETICULOCYTES
RBC.: 3.63 MIL/uL — ABNORMAL LOW (ref 3.87–5.11)
RETIC CT PCT: 1.4 % (ref 0.4–3.1)
Retic Count, Absolute: 50.8 10*3/uL (ref 19.0–186.0)

## 2015-09-26 LAB — FERRITIN: FERRITIN: 398 ng/mL — AB (ref 11–307)

## 2015-09-26 LAB — MRSA PCR SCREENING: MRSA BY PCR: NEGATIVE

## 2015-09-26 LAB — FOLATE: Folate: 26 ng/mL (ref >5.9)

## 2015-09-26 LAB — URINE CULTURE: CULTURE: NO GROWTH

## 2015-09-26 LAB — STREP PNEUMONIAE URINARY ANTIGEN: Strep Pneumo Urinary Antigen: NEGATIVE

## 2015-09-26 LAB — OCCULT BLOOD X 1 CARD TO LAB, STOOL: Fecal Occult Bld: NEGATIVE

## 2015-09-26 LAB — VITAMIN B12: Vitamin B-12: 593 pg/mL (ref 180–914)

## 2015-09-26 LAB — HIV ANTIBODY (ROUTINE TESTING W REFLEX): HIV SCREEN 4TH GENERATION: NONREACTIVE

## 2015-09-26 MED ORDER — METHYLPREDNISOLONE SODIUM SUCC 125 MG IJ SOLR
80.0000 mg | Freq: Three times a day (TID) | INTRAMUSCULAR | Status: DC
  Administered 2015-09-26: 80 mg via INTRAVENOUS
  Filled 2015-09-26: qty 2

## 2015-09-26 MED ORDER — METHYLPREDNISOLONE SODIUM SUCC 40 MG IJ SOLR
40.0000 mg | Freq: Two times a day (BID) | INTRAMUSCULAR | Status: DC
  Administered 2015-09-26 – 2015-09-28 (×4): 40 mg via INTRAVENOUS
  Filled 2015-09-26 (×4): qty 1

## 2015-09-26 MED ORDER — TRAZODONE HCL 50 MG PO TABS
25.0000 mg | ORAL_TABLET | Freq: Once | ORAL | Status: AC
  Administered 2015-09-26: 25 mg via ORAL
  Filled 2015-09-26: qty 1

## 2015-09-26 MED ORDER — METHYLPREDNISOLONE SODIUM SUCC 40 MG IJ SOLR: 40.0000 mg | Freq: Three times a day (TID) | INTRAMUSCULAR | Status: DC

## 2015-09-26 MED ORDER — PANTOPRAZOLE SODIUM 40 MG PO TBEC
40.0000 mg | DELAYED_RELEASE_TABLET | Freq: Every day | ORAL | Status: DC
  Administered 2015-09-26 – 2015-10-02 (×7): 40 mg via ORAL
  Filled 2015-09-26 (×7): qty 1

## 2015-09-26 NOTE — Evaluation (Signed)
Physical Therapy Evaluation Patient Details Name: [name] MRN: [id number] DOB: 10-Feb-1946 Today's Date: 09/26/2015   History of Present Illness  This 70 y.o. female admitted to with SOB, weakness, fever, and generalized pain.  Dx: Sepsis, acute on chronic respiratory failrue with hypoxia/acute bronchitis.  PMH includes:  COPD, HTN, Depression, A-Fib, arthritis, ashtma, brain aneurysm, sleep apnea, RA, emphysema     Clinical Impression  Pt admitted with above diagnosis. Pt currently with functional limitations due to the deficits listed below (see PT Problem List). PTA pt living alone but had much difficulty w/ ADLs.  She currently requires min guard>min assist for sit>stand transfers due to instability and presents w/ generalized weakness. Unable to assess gait this session as HR up to 156 w/ sit>stand.  Pt will benefit from skilled PT to increase their independence and safety with mobility to allow discharge to the venue listed below.      Follow Up Recommendations SNF;Supervision for mobility/OOB    Equipment Recommendations  None recommended by PT    Recommendations for Other Services       Precautions / Restrictions Precautions Precautions: Fall Precaution Comments: monitor 02 Restrictions Weight Bearing Restrictions: No      Mobility  Bed Mobility               General bed mobility comments: sitting on EOB upon PT arrival  Transfers Overall transfer level: Needs assistance Equipment used: Rolling walker (2 wheeled);None Transfers: Sit to/from Stand Sit to Stand: Min guard;Min assist Stand pivot transfers: Min guard       General transfer comment: Min guard when using RW w/ cues for hand placement.  Min assist when stands from bed w/o AD due to instability.  Ambulation/Gait             General Gait Details: Was prepared to assess gait; however, HR up to 156 upon sit>stand.  Question reliability of this HR reading as it quickly decreased to 105 and  remained 89-110 the remainder of the session.  Stairs            Wheelchair Mobility    Modified Rankin (Stroke Patients Only)       Balance Overall balance assessment: Needs assistance Sitting-balance support: Feet supported;No upper extremity supported Sitting balance-Leahy Scale: Good     Standing balance support: During functional activity Standing balance-Leahy Scale: Poor Standing balance comment: Relies on UE support                             Pertinent Vitals/Pain Pain Assessment: No/denies pain    Home Living Family/patient expects to be discharged to:: Private residence Living Arrangements: Alone Available Help at Discharge: Family;Available 24 hours/day Type of Home: House Home Access: Stairs to enter Entrance Stairs-Rails: Doctor, general practice of Steps: 2 Home Layout: Two level;Able to live on main level with bedroom/bathroom;1/2 bath on main level Home Equipment: Walker - 4 wheels;Bedside commode Additional Comments: Pt reports she has no family locally.  She reports her sister will be coming from [place] to assist her after discharge     Prior Function           Comments: Pt was living alone, but reports she was unable to get dressed most mornings, so stayed in her night clothes.  She was not able to grocery shop so her nephew from [place] stops in every few weeks and gets her groceries.  She orders take out and has meals  delivered and asks the delivery person to retrieve her mail.      Hand Dominance   Dominant Hand: Right    Extremity/Trunk Assessment   Upper Extremity Assessment: Defer to OT evaluation           Lower Extremity Assessment: Generalized weakness      Cervical / Trunk Assessment: Normal  Communication   Communication: No difficulties  Cognition Arousal/Alertness: Awake/alert Behavior During Therapy: WFL for tasks assessed/performed Overall Cognitive Status: Within Functional Limits for tasks  assessed                      General Comments General comments (skin integrity, edema, etc.): HR up to 156 upon standing, all other VSS throughout session.    Exercises General Exercises - Lower Extremity Ankle Circles/Pumps: AROM;Both;10 reps;Seated Long Arc Quad: AROM;Both;10 reps;Seated Hip Flexion/Marching: AROM;Both;10 reps;Seated      Assessment/Plan    PT Assessment Patient needs continued PT services  PT Diagnosis Difficulty walking;Generalized weakness   PT Problem List Decreased strength;Decreased activity tolerance;Decreased balance;Decreased knowledge of use of DME;Decreased safety awareness;Cardiopulmonary status limiting activity  PT Treatment Interventions DME instruction;Gait training;Stair training;Functional mobility training;Therapeutic activities;Therapeutic exercise;Balance training;Patient/family education   PT Goals (Current goals can be found in the Care Plan section) Acute Rehab PT Goals Patient Stated Goal: to get stronger  PT Goal Formulation: With patient Time For Goal Achievement: 10/10/15 Potential to Achieve Goals: Good    Frequency Min 3X/week   Barriers to discharge Inaccessible home environment;Decreased caregiver support Lives alone w/o support    Co-evaluation               End of Session Equipment Utilized During Treatment: Gait belt;Oxygen Activity Tolerance: Treatment limited secondary to medical complications (Comment) (elevated HR) Patient left: in bed;with call bell/phone within reach;with bed alarm set;Other (comment) (sitting EOB) Nurse Communication: Mobility status;Other (comment) (HR)         Time: 1610-9604 PT Time Calculation (min) (ACUTE ONLY): 21 min   Charges:   PT Evaluation $PT Eval Moderate Complexity: 1 Procedure     PT G Codes:       Encarnacion Chu PT, DPT  Pager: [phone] Phone: [phone] 09/26/2015, 4:33 PM

## 2015-09-26 NOTE — Evaluation (Signed)
Occupational Therapy Evaluation Patient Details Name: [name] MRN: [id number] DOB: [birth year] Today's Date: 09/26/2015    History of Present Illness This 70 y.o. female admitted to with SOB, weakness, fever, and generalized pain.  Dx: Sepsis, acute on chronic respiratory failrue with hypoxia/acute bronchitis.  PMH includes:  COPD, HTN, Depression, A-Fib, arthritis, ashtma, brain aneurysm, sleep apnea, RA, emphysema    Clinical Impression   Pt admitted with above. She demonstrates the below listed deficits and will benefit from continued OT to maximize safety and independence with BADLs.  Pt requires min guard assist for ADLs, but sats decrease to 83% on 5L 02, and she fatigues quickly.  She has poor standing tolerance.  She was living alone PTA, but per her report, was functioning very marginally.  She will require 24 hour supervision/assist at discharge, and would likely benefit from SNF level rehab prior to return home to allow her to maximize her independence and safety with ADLs and functional mobility.       Follow Up Recommendations  SNF;Supervision/Assistance - 24 hour    Equipment Recommendations  None recommended by OT    Recommendations for Other Services       Precautions / Restrictions Precautions Precautions: Fall Precaution Comments: monitor 02      Mobility Bed Mobility               General bed mobility comments: sitting on EOB   Transfers Overall transfer level: Needs assistance   Transfers: Sit to/from Stand;Stand Pivot Transfers Sit to Stand: Min guard Stand pivot transfers: Min guard       General transfer comment: min guard for safety     Balance Overall balance assessment: Needs assistance Sitting-balance support: Feet supported Sitting balance-Leahy Scale: Good     Standing balance support: Single extremity supported;During functional activity Standing balance-Leahy Scale: Poor Standing balance comment: reliant on UE support                              ADL Overall ADL's : Needs assistance/impaired Eating/Feeding: Independent   Grooming: Wash/dry hands;Wash/dry face;Oral care;Brushing hair;Set up;Sitting Grooming Details (indicate cue type and reason): Pt unable to stand for grooming due to SOB and fatigue  Upper Body Bathing: Set up;Supervision/ safety;Sitting   Lower Body Bathing: Min guard;Sit to/from stand   Upper Body Dressing : Set up;Sitting   Lower Body Dressing: Min guard;Sit to/from stand   Toilet Transfer: Min guard;Stand-pivot;BSC   Toileting- Architect and Hygiene: Min guard;Sit to/from stand       Functional mobility during ADLs: Min guard;Minimal assistance;Rolling walker General ADL Comments: 02 sats decreased to 83% on 5L, but quickly rebounds with pursed lip breathing and rest.       Vision     Perception     Praxis      Pertinent Vitals/Pain Pain Assessment: No/denies pain     Hand Dominance Right   Extremity/Trunk Assessment Upper Extremity Assessment Upper Extremity Assessment: Generalized weakness   Lower Extremity Assessment Lower Extremity Assessment: Defer to PT evaluation   Cervical / Trunk Assessment Cervical / Trunk Assessment: Normal   Communication Communication Communication: No difficulties   Cognition Arousal/Alertness: Awake/alert Behavior During Therapy: WFL for tasks assessed/performed Overall Cognitive Status: Within Functional Limits for tasks assessed                     General Comments       Exercises  Shoulder Instructions      Home Living Family/patient expects to be discharged to:: Private residence Living Arrangements: Alone Available Help at Discharge: Family;Available 24 hours/day Type of Home: House Home Access: Stairs to enter [employer] of Steps: 2 Entrance Stairs-Rails: Right;Left Home Layout: Two level;Able to live on main level with bedroom/bathroom;1/2 bath on main  level Alternate Level Stairs-Number of Steps: flight Alternate Level Stairs-Rails: Right;Left     Bathroom Toilet: Standard     Home Equipment: Walker - 4 wheels;Bedside commode   Additional Comments: Pt reports she has no family locally.  She reports her sister will be coming from [place] to assist her after discharge       Prior Functioning/Environment          Comments: Pt was living alone, but reports she was unable to get dressed most mornings, so stayed in her night clothes.  She was not able to grocery shop so her nephew from [place] stops in every few weeks and gets her groceries.  She orders take out and has meals delivered and asks the delivery person to retrieve her mail.     OT Diagnosis: Generalized weakness   OT Problem List: Decreased strength;Decreased activity tolerance;Impaired balance (sitting and/or standing);Decreased safety awareness;Cardiopulmonary status limiting activity;Obesity   OT Treatment/Interventions: Self-care/ADL training;Therapeutic exercise;DME and/or AE instruction;Therapeutic activities;Patient/family education;Balance training    OT Goals(Current goals can be found in the care plan section) Acute Rehab OT Goals Patient Stated Goal: to get stronger  OT Goal Formulation: With patient Time For Goal Achievement: 10/10/15 Potential to Achieve Goals: Good ADL Goals Pt Will Perform Grooming: with supervision;standing Additional ADL Goal #1: Pt will participate in 20 mins therapeutic activity with no more than 3 rest breaks and sats at least 88%.  Additional ADL Goal #2: Pt will be independent with HEP for bil. UE strengthening   OT Frequency: Min 2X/week   Barriers to D/C: Decreased caregiver support          Co-evaluation              End of Session Equipment Utilized During Treatment: Oxygen Nurse Communication: Mobility status  Activity Tolerance: Patient limited by fatigue Patient left: in bed;with call bell/phone within reach    Time: 1456-1520 OT Time Calculation (min): 24 min Charges:  OT General Charges $OT Visit: 1 Procedure OT Evaluation $OT Eval Moderate Complexity: 1 Procedure OT Treatments $Self Care/Home Management : 8-22 mins G-Codes:    Alcee Sipos M 10-23-2015, 3:45 PM

## 2015-09-26 NOTE — Progress Notes (Signed)
PROGRESS NOTE                                                                                                                                                                                                             Patient Demographics:    [name], is a 70 y.o. female, DOB - [birth year], PYK:[id number]  Admit date - 09/25/2015   Admitting Physician Eduard Clos, MD  Outpatient Primary MD for the patient is [name], [place]  LOS - 1  Chief Complaint  Patient presents with  . Respiratory Distress       Brief Narrative   70 y.o. female with a Past Medical History of cerebral aneurysm, HTN, Afib, Anemia, asthma, DM, adrenal mass, GERD, RA, depression, COPD, who presents with Sepsis and COPD exacerbation w/ likely CAP, With significant hypercapnia, requiring BiPAP initially with a significant improvement.   Subjective:    [name] today has, No headache, No chest pain, Denies any dyspnea or abdominal pain.  Assessment  & Plan :    Principal Problem:   Sepsis (HCC) Active Problems:   Diabetes mellitus type II, uncontrolled (HCC)   OSA (obstructive sleep apnea)   Obesity   Iron deficiency anemia   Acute on chronic respiratory failure with hypoxia (HCC)   Hypertension   Acute bronchitis with COPD (HCC)   Acute hyponatremia   Increased anion gap metabolic acidosis   Leukocytosis   Hypokalemia  Acute on chronic hypercapnic/hypoxic respiratory failure. - Patient with baseline COPD, on on home oxygen - Was significant hypercapnia, requiring BiPAP, significantly improved - due to COPD exacerbation, as well very likely acute bronchitis .  COPD exacerbation - Continue with Rocephin and azithromycin - Continue with IV steroids - Continue with nebulizer treatment, Spiriva and Dulera  Diabetes mellitus - Continue to hold metformin, continue with insulin sliding scale  SIRS - Patient presented with significant leukocytosis,  elevated lactic acid, reports fever and abdominal pain at home, possibly related to viral GI infection, abdominal pain resolved, benign abdominal exam today.  Anemia - Will be 9.1 today, was 11.9 on admission, most likely delusional factor contributing giving aggressive IV hydration, but she did report dark color stool overnight, will check anemia panel and Hemoccult stool.  Hypertension  - Acceptable, continue to hold home medication including tenotomy and  Hyponatremia - Continue to  hold Dyazide and Lasix, improving with IV fluids  OSA - Continue C Pap   Code Status : Full  Family Communication  : None at bedside  Disposition Plan  : Pending PT  Consults  :  none  Procedures  : none  DVT Prophylaxis  :  Lovenox   Lab Results  Component Value Date   PLT 350 09/26/2015    Antibiotics  :    Anti-infectives    Start     Dose/Rate Route Frequency Ordered Stop   09/26/15 0800  cefTRIAXone (ROCEPHIN) 1 g in dextrose 5 % 50 mL IVPB     1 g 100 mL/hr over 30 Minutes Intravenous Every 24 hours 09/25/15 1551 10/03/15 0759   09/26/15 0800  azithromycin (ZITHROMAX) 500 mg in dextrose 5 % 250 mL IVPB     500 mg 250 mL/hr over 60 Minutes Intravenous Every 24 hours 09/25/15 1551 10/03/15 0759   09/25/15 0545  cefTRIAXone (ROCEPHIN) 2 g in dextrose 5 % 50 mL IVPB     2 g 100 mL/hr over 30 Minutes Intravenous  Once 09/25/15 0533 09/25/15 0808   09/25/15 0545  azithromycin (ZITHROMAX) 500 mg in dextrose 5 % 250 mL IVPB     500 mg 250 mL/hr over 60 Minutes Intravenous  Once 09/25/15 0533 09/25/15 1029        Objective:   Filed Vitals:   09/26/15 0755 09/26/15 0758 09/26/15 0759 09/26/15 0800  BP:    129/65  Pulse:    70  Temp:    97.7 F (36.5 C)  TempSrc:    Oral  Resp:    16  Height:      Weight:      SpO2: 100% 93% 100% 100%    Wt Readings from Last 3 Encounters:  09/25/15 121.11 kg (267 lb)  02/06/15 117.6 kg (259 lb 4.2 oz)  06/03/11 90.719 kg (200 lb)      Intake/Output Summary (Last 24 hours) at 09/26/15 0923 Last data filed at 09/26/15 0600  Gross per 24 hour  Intake   1500 ml  Output      0 ml  Net   1500 ml     Physical Exam  Awake Alert, Oriented X 3,  [place].AT,PERRAL Supple Neck,No JVD,  Symmetrical Chest wall movement, Good air movement bilaterally, CTAB RRR,No Gallops,Rubs or new Murmurs, No Parasternal Heave +ve B.Sounds, Abd Soft, No tenderness,  No rebound - guarding or rigidity. No Cyanosis, Clubbing or edema, No new Rash or bruise     Data Review:    CBC  Recent Labs Lab 09/25/15 0435 09/26/15 0714  WBC 26.3* 24.7*  HGB 11.9* 9.1*  HCT 38.1 30.6*  PLT 414* 350  MCV 82.6 86.2  MCH 25.8* 25.6*  MCHC 31.2 29.7*  RDW 14.8 15.1  LYMPHSABS 1.8  --   MONOABS 1.6*  --   EOSABS 0.0  --   BASOSABS 0.0  --     Chemistries   Recent Labs Lab 09/25/15 0435 09/25/15 0747 09/25/15 0900 09/26/15 0714  NA 126* 127*  --  135  K 3.6 3.0*  --  4.7  CL 78* 82*  --  95*  CO2 30 27  --  31  GLUCOSE 205* 305*  --  112*  BUN 24* 23*  --  13  CREATININE 1.70* 1.55*  --  1.05*  CALCIUM 9.2 8.0*  --  8.5*  MG  --   --  2.0  --    ------------------------------------------------------------------------------------------------------------------  No results for input(s): CHOL, HDL, LDLCALC, TRIG, CHOLHDL, LDLDIRECT in the last 72 hours.  Lab Results  Component Value Date   HGBA1C 7.7* 09/25/2015   ------------------------------------------------------------------------------------------------------------------ No results for input(s): TSH, T4TOTAL, T3FREE, THYROIDAB in the last 72 hours.  Invalid input(s): FREET3 ------------------------------------------------------------------------------------------------------------------ No results for input(s): VITAMINB12, FOLATE, FERRITIN, TIBC, IRON, RETICCTPCT in the last 72 hours.  Coagulation profile  Recent Labs Lab 09/25/15 0900  INR 1.27    No results  for input(s): DDIMER in the last 72 hours.  Cardiac Enzymes No results for input(s): CKMB, TROPONINI, MYOGLOBIN in the last 168 hours.  Invalid input(s): CK ------------------------------------------------------------------------------------------------------------------    Component Value Date/Time   BNP 95.4 09/25/2015 0435    Inpatient Medications  Scheduled Meds: . azithromycin  500 mg Intravenous Q24H  . cefTRIAXone (ROCEPHIN)  IV  1 g Intravenous Q24H  . enoxaparin (LOVENOX) injection  40 mg Subcutaneous Q24H  . insulin aspart  0-15 Units Subcutaneous Q4H  . insulin glargine  5 Units Subcutaneous Daily  . ipratropium-albuterol  3 mL Nebulization QID  . methylPREDNISolone (SOLU-MEDROL) injection  40 mg Intravenous Q8H  . mometasone-formoterol  2 puff Inhalation BID  . pantoprazole  40 mg Oral Daily  . tiotropium  18 mcg Inhalation Daily   Continuous Infusions: . 0.9 % NaCl with KCl 20 mEq / L 75 mL/hr at 09/26/15 0807   PRN Meds:.ipratropium-albuterol  Micro Results Recent Results (from the past 240 hour(s))  Urine culture     Status: None   Collection Time: 09/25/15  8:38 AM  Result Value Ref Range Status   Specimen Description URINE, CATHETERIZED  Final   Special Requests NONE  Final   Culture NO GROWTH  Final   Report Status 09/26/2015 FINAL  Final  MRSA PCR Screening     Status: None   Collection Time: 09/26/15  5:58 AM  Result Value Ref Range Status   MRSA by PCR NEGATIVE NEGATIVE Final    Comment:        The GeneXpert MRSA Assay (FDA approved for NASAL specimens only), is one component of a comprehensive MRSA colonization surveillance program. It is not intended to diagnose MRSA infection nor to guide or monitor treatment for MRSA infections.     Radiology Reports Dg Chest 2 View  09/25/2015  CLINICAL DATA:  I initial evaluation for acute shortness of breath. EXAM: CHEST  2 VIEW COMPARISON:  Prior study from 02/20/2015. FINDINGS: Mild accentuation  of the cardiac silhouette, likely related to AP technique. Mediastinal silhouette within normal limits. Atheromatous plaque within the intrathoracic aorta. Lungs are normally inflated. Changes related emphysema present. Mild left basilar subsegmental atelectasis/ scar noted. Irregular biapical pleural thickening, stable. No focal infiltrates. Hazy opacity overlying the lower spine on lateral projection favored to be related to overlying soft tissue attenuation. No pulmonary edema or pleural effusion. No pneumothorax. Remotely healed left-sided rib fractures. No acute osseous abnormality. Scoliosis. Osteopenia. IMPRESSION: 1. Emphysema.  No other active cardiopulmonary disease. 2. Atherosclerosis. Electronically Signed   By: Rise Mu M.D.   On: 09/25/2015 05:26   Dg Chest Port 1 View  09/26/2015  CLINICAL DATA:  Short of breath.  COPD. EXAM: PORTABLE CHEST 1 VIEW COMPARISON:  Yesterday FINDINGS: Moderate cardiomegaly. Mild bibasilar atelectasis. Healing left lateral mid rib fractures. No sign of pulmonary edema or consolidation. No pneumothorax. No pleural effusion. IMPRESSION: Cardiomegaly without pulmonary edema. Bibasilar atelectasis. Electronically Signed   By: Jolaine Click M.D.   On: 09/26/2015 08:24  Time Spent in minutes  25 minutes   Sumayah Bearse M.D on 09/26/2015 at 9:23 AM  Between 7am to 7pm - Pager - [phone]  After 7pm go to [email or web address] - password [facility]  [facility] -  Office  [phone]

## 2015-09-27 DIAGNOSIS — J440 Chronic obstructive pulmonary disease with acute lower respiratory infection: Secondary | ICD-10-CM

## 2015-09-27 DIAGNOSIS — E871 Hypo-osmolality and hyponatremia: Secondary | ICD-10-CM

## 2015-09-27 DIAGNOSIS — R6510 Systemic inflammatory response syndrome (SIRS) of non-infectious origin without acute organ dysfunction: Secondary | ICD-10-CM

## 2015-09-27 LAB — CBC
HEMATOCRIT: 30.1 % — AB (ref 36.0–46.0)
HEMOGLOBIN: 9 g/dL — AB (ref 12.0–15.0)
MCH: 25.4 pg — AB (ref 26.0–34.0)
MCHC: 29.9 g/dL — AB (ref 30.0–36.0)
MCV: 85 fL (ref 78.0–100.0)
Platelets: 362 10*3/uL (ref 150–400)
RBC: 3.54 MIL/uL — AB (ref 3.87–5.11)
RDW: 15 % (ref 11.5–15.5)
WBC: 15.6 10*3/uL — ABNORMAL HIGH (ref 4.0–10.5)

## 2015-09-27 LAB — GLUCOSE, CAPILLARY
GLUCOSE-CAPILLARY: 141 mg/dL — AB (ref 65–99)
GLUCOSE-CAPILLARY: 172 mg/dL — AB (ref 65–99)
Glucose-Capillary: 193 mg/dL — ABNORMAL HIGH (ref 65–99)
Glucose-Capillary: 91 mg/dL (ref 65–99)
Glucose-Capillary: 105 mg/dL — ABNORMAL HIGH (ref 65–99)

## 2015-09-27 LAB — BASIC METABOLIC PANEL
Anion gap: 9 (ref 5–15)
BUN: 18 mg/dL (ref 6–20)
CALCIUM: 8.5 mg/dL — AB (ref 8.9–10.3)
CHLORIDE: 97 mmol/L — AB (ref 101–111)
CO2: 27 mmol/L (ref 22–32)
CREATININE: 1.07 mg/dL — AB (ref 0.44–1.00)
GFR calc Af Amer: 60 mL/min — ABNORMAL LOW (ref >60)
GFR calc non Af Amer: 52 mL/min — ABNORMAL LOW (ref >60)
Glucose, Bld: 146 mg/dL — ABNORMAL HIGH (ref 65–99)
Potassium: 5 mmol/L (ref 3.5–5.1)
Sodium: 133 mmol/L — ABNORMAL LOW (ref 135–145)

## 2015-09-27 MED ORDER — FERROUS SULFATE 325 (65 FE) MG PO TABS
325.0000 mg | ORAL_TABLET | Freq: Every day | ORAL | Status: DC
  Administered 2015-09-27 – 2015-10-02 (×6): 325 mg via ORAL
  Filled 2015-09-27 (×8): qty 1

## 2015-09-27 MED ORDER — ATENOLOL 25 MG PO TABS
25.0000 mg | ORAL_TABLET | Freq: Every day | ORAL | Status: DC
  Administered 2015-09-27 – 2015-09-28 (×2): 25 mg via ORAL
  Filled 2015-09-27 (×3): qty 1

## 2015-09-27 MED ORDER — TRAZODONE HCL 50 MG PO TABS
50.0000 mg | ORAL_TABLET | Freq: Once | ORAL | Status: AC
  Administered 2015-09-27: 50 mg via ORAL
  Filled 2015-09-27: qty 1

## 2015-09-27 MED ORDER — LORAZEPAM 0.5 MG PO TABS
0.5000 mg | ORAL_TABLET | Freq: Three times a day (TID) | ORAL | Status: DC | PRN
  Administered 2015-09-27 – 2015-09-30 (×5): 0.5 mg via ORAL
  Filled 2015-09-27 (×5): qty 1

## 2015-09-27 NOTE — Progress Notes (Signed)
Physical Therapy Treatment Patient Details Name: [name] MRN: [id number] DOB: Sep 07, 1945 Today's Date: 09/27/2015    History of Present Illness This 70 y.o. female admitted to with SOB, weakness, fever, and generalized pain.  Dx: Sepsis, acute on chronic respiratory failrue with hypoxia/acute bronchitis.  PMH includes:  COPD, HTN, Depression, A-Fib, arthritis, ashtma, brain aneurysm, sleep apnea, RA, emphysema     PT Comments    [name] made progress functionally today but requires cues for energy conservation techniques w/ activity.  SpO2 dropped down to 86% x1 which improved to low 90s w/ pursed lip breathing and rest break.  Pt will benefit from continued skilled PT services to increase functional independence and safety.   Follow Up Recommendations  SNF;Supervision for mobility/OOB     Equipment Recommendations  None recommended by PT    Recommendations for Other Services       Precautions / Restrictions Precautions Precautions: Fall Precaution Comments: monitor 02 Restrictions Weight Bearing Restrictions: No    Mobility  Bed Mobility               General bed mobility comments: sitting on EOB upon PT arrival  Transfers Overall transfer level: Needs assistance Equipment used: Rolling walker (2 wheeled) Transfers: Sit to/from Stand Sit to Stand: Min guard         General transfer comment: Min guard when using RW w/ cues for hand placement.   Ambulation/Gait Ambulation/Gait assistance: Min guard Ambulation Distance (Feet): 80 Feet Assistive device: Rolling walker (2 wheeled) Gait Pattern/deviations: Step-through pattern;Decreased stride length   Gait velocity interpretation: Below normal speed for age/gender General Gait Details: Min guard as pt unsteady at times w/ RW, needs reminders to keep RW on floor.  Cues for standing rest break every 20 ft to conserve energy and due to SOB.  SpO2 dropped down to 86% x1 which improved to low 90s w/ pursed lip  breathing and rest break..   Stairs            Wheelchair Mobility    Modified Rankin (Stroke Patients Only)       Balance Overall balance assessment: Needs assistance Sitting-balance support: Feet supported;No upper extremity supported Sitting balance-Leahy Scale: Good     Standing balance support: Bilateral upper extremity supported;During functional activity Standing balance-Leahy Scale: Poor Standing balance comment: Relies on UE support                    Cognition Arousal/Alertness: Awake/alert Behavior During Therapy: WFL for tasks assessed/performed Overall Cognitive Status: Within Functional Limits for tasks assessed                      Exercises General Exercises - Upper Extremity Shoulder Flexion: AROM;Both;10 reps;Seated General Exercises - Lower Extremity Ankle Circles/Pumps: AROM;Both;10 reps;Seated Long Arc Quad: AROM;Both;10 reps;Seated Hip Flexion/Marching: AROM;Both;10 reps;Seated    General Comments General comments (skin integrity, edema, etc.): HR 80s-103 throughout session.  Focused on educating pt on need for energy conservation techniques (i.e. taking rest breaks and pursed lip breathing) so she does not reach the point where she can do no more. Pt understanding and appreciative.      Pertinent Vitals/Pain Pain Assessment: No/denies pain    Home Living                      Prior Function            PT Goals (current goals can now be found in the  care plan section) Acute Rehab PT Goals Patient Stated Goal: to get stronger  PT Goal Formulation: With patient Time For Goal Achievement: 10/10/15 Potential to Achieve Goals: Good Progress towards PT goals: Progressing toward goals    Frequency  Min 3X/week    PT Plan Current plan remains appropriate    Co-evaluation             End of Session Equipment Utilized During Treatment: Gait belt;Oxygen Activity Tolerance: Patient limited by  fatigue Patient left: in bed;with call bell/phone within reach;with bed alarm set;Other (comment) (sitting EOB)     Time: 1610-9604 PT Time Calculation (min) (ACUTE ONLY): 18 min  Charges:  $Gait Training: 8-22 mins                    G Codes:      Encarnacion Chu PT, DPT  Pager: [phone] Phone: [phone] 09/27/2015, 12:54 PM

## 2015-09-27 NOTE — Progress Notes (Signed)
PROGRESS NOTE                                                                                                                                                                                                             Patient Demographics:    [name], is a 70 y.o. female, DOB - 1945/11/25, WUJ:[id number]  Admit date - 09/25/2015   Admitting Physician Eduard Clos, MD  Outpatient Primary MD for the patient is [name], MD  LOS - 2  Chief Complaint  Patient presents with  . Respiratory Distress       Brief Narrative   70 y.o. female with a Past Medical History of cerebral aneurysm, HTN, Afib, Anemia, asthma, DM, adrenal mass, GERD, RA, depression, COPD, who presents with Sepsis and COPD exacerbation w/ likely CAP, With significant hypercapnia, requiring BiPAP initially , Significantly improved respiratory status, back on oxygen via nasal cannula , leukocytosis trending down, is likely due to to viral GI illnesses .    Subjective:    [name] today has, No headache, No chest pain, Denies any dyspnea or abdominal pain.  Assessment  & Plan :    Principal Problem:   Sepsis (HCC) Active Problems:   Diabetes mellitus type II, uncontrolled (HCC)   OSA (obstructive sleep apnea)   Obesity   Iron deficiency anemia   Acute on chronic respiratory failure with hypoxia (HCC)   Hypertension   Acute bronchitis with COPD (HCC)   Acute hyponatremia   Increased anion gap metabolic acidosis   Leukocytosis   Hypokalemia  Acute on chronic hypercapnic/hypoxic respiratory failure. - Patient with baseline COPD, on on home oxygen - Was significant hypercapnia,Initially requiring BiPAP, significantly improved, no further medial BiPAP of her last 24 hours. - due to COPD exacerbation, as well very likely acute bronchitis .  COPD exacerbation - Continue with Rocephin and azithromycin - Continue with IV steroids,  - Continue with nebulizer  treatment, Spiriva and Dulera  Diabetes mellitus - Continue to hold metformin, continue with insulin sliding scale and Lantus.  SIRS - Patient presented with significant leukocytosis, elevated lactic acid, reports fever and abdominal pain at home, possibly related to viral GI infection, abdominal pain resolved, benign abdominal exam , leukocytosis trending down, no further diarrhea.  Anemia - Anemia of chronic disease , will being 11.9 on admission, currently  9 ,  most likely delusional factor contributing giving aggressive IV hydration, Hemoccult negative, iron level of 23, ferritin of the 98, normal folate and B-12 level.  Hypertension  - Started to increase, will resume on atenolol, Tegretol and docked on/hydrochlorothiazide  Hyponatremia - Continue to hold Dyazide and Lasix, improving with IV fluids  Hypokalemia - Significantly improved, potassium level is 5 today, so we'll discontinue supplement  OSA - Continue C Pap   Code Status : Full  Family Communication  : None at bedside  Disposition Plan  : We'll need SNF placement hopefully in 1-2 days.  Consults  :  none  Procedures  : none  DVT Prophylaxis  :  Lovenox   Lab Results  Component Value Date   PLT 362 09/27/2015    Antibiotics  :    Anti-infectives    Start     Dose/Rate Route Frequency Ordered Stop   09/26/15 0800  cefTRIAXone (ROCEPHIN) 1 g in dextrose 5 % 50 mL IVPB     1 g 100 mL/hr over 30 Minutes Intravenous Every 24 hours 09/25/15 1551 10/03/15 0759   09/26/15 0800  azithromycin (ZITHROMAX) 500 mg in dextrose 5 % 250 mL IVPB     500 mg 250 mL/hr over 60 Minutes Intravenous Every 24 hours 09/25/15 1551 10/03/15 0759   09/25/15 0545  cefTRIAXone (ROCEPHIN) 2 g in dextrose 5 % 50 mL IVPB     2 g 100 mL/hr over 30 Minutes Intravenous  Once 09/25/15 0533 09/25/15 0808   09/25/15 0545  azithromycin (ZITHROMAX) 500 mg in dextrose 5 % 250 mL IVPB     500 mg 250 mL/hr over 60 Minutes Intravenous  Once  09/25/15 0533 09/25/15 1029        Objective:   Filed Vitals:   09/26/15 2314 09/27/15 0430 09/27/15 0717 09/27/15 0800  BP: 160/76 123/57  150/66  Pulse: 98 86  84  Temp: 98 F (36.7 C) 97.7 F (36.5 C)  97.7 F (36.5 C)  TempSrc: Oral Oral  Oral  Resp: 17 19  22   Height:      Weight:      SpO2: 94% 98% 95% 100%    Wt Readings from Last 3 Encounters:  09/25/15 121.11 kg (267 lb)  02/06/15 117.6 kg (259 lb 4.2 oz)  06/03/11 90.719 kg (200 lb)     Intake/Output Summary (Last 24 hours) at 09/27/15 0958 Last data filed at 09/27/15 0400  Gross per 24 hour  Intake 2205.83 ml  Output   1050 ml  Net 1155.83 ml     Physical Exam  Awake Alert, Oriented X 3,  [place].AT,PERRAL Supple Neck,No JVD,  Symmetrical Chest wall movement, Good air movement bilaterally, CTAB RRR,No Gallops,Rubs or new Murmurs, No Parasternal Heave +ve B.Sounds, Abd Soft, No tenderness,  No rebound - guarding or rigidity. No Cyanosis, Clubbing or edema, No new Rash or bruise     Data Review:    CBC  Recent Labs Lab 09/25/15 0435 09/26/15 0714 09/27/15 0856  WBC 26.3* 24.7* 15.6*  HGB 11.9* 9.1* 9.0*  HCT 38.1 30.6* 30.1*  PLT 414* 350 362  MCV 82.6 86.2 85.0  MCH 25.8* 25.6* 25.4*  MCHC 31.2 29.7* 29.9*  RDW 14.8 15.1 15.0  LYMPHSABS 1.8  --   --   MONOABS 1.6*  --   --   EOSABS 0.0  --   --   BASOSABS 0.0  --   --     Chemistries  Recent Labs Lab 09/25/15 0435 09/25/15 0747 09/25/15 0900 09/26/15 0714 09/27/15 0430  NA 126* 127*  --  135 133*  K 3.6 3.0*  --  4.7 5.0  CL 78* 82*  --  95* 97*  CO2 30 27  --  31 27  GLUCOSE 205* 305*  --  112* 146*  BUN 24* 23*  --  13 18  CREATININE 1.70* 1.55*  --  1.05* 1.07*  CALCIUM 9.2 8.0*  --  8.5* 8.5*  MG  --   --  2.0  --   --    ------------------------------------------------------------------------------------------------------------------ No results for input(s): CHOL, HDL, LDLCALC, TRIG, CHOLHDL, LDLDIRECT in the last  72 hours.  Lab Results  Component Value Date   HGBA1C 7.7* 09/25/2015   ------------------------------------------------------------------------------------------------------------------ No results for input(s): TSH, T4TOTAL, T3FREE, THYROIDAB in the last 72 hours.  Invalid input(s): FREET3 ------------------------------------------------------------------------------------------------------------------  Recent Labs  09/26/15 0937  VITAMINB12 593  FOLATE 26.0  FERRITIN 398*  TIBC 237*  IRON 23*  RETICCTPCT 1.4    Coagulation profile  Recent Labs Lab 09/25/15 0900  INR 1.27    No results for input(s): DDIMER in the last 72 hours.  Cardiac Enzymes No results for input(s): CKMB, TROPONINI, MYOGLOBIN in the last 168 hours.  Invalid input(s): CK ------------------------------------------------------------------------------------------------------------------    Component Value Date/Time   BNP 95.4 09/25/2015 0435    Inpatient Medications  Scheduled Meds: . azithromycin  500 mg Intravenous Q24H  . cefTRIAXone (ROCEPHIN)  IV  1 g Intravenous Q24H  . enoxaparin (LOVENOX) injection  40 mg Subcutaneous Q24H  . insulin aspart  0-15 Units Subcutaneous Q4H  . insulin glargine  5 Units Subcutaneous Daily  . ipratropium-albuterol  3 mL Nebulization QID  . methylPREDNISolone (SOLU-MEDROL) injection  40 mg Intravenous BID  . mometasone-formoterol  2 puff Inhalation BID  . pantoprazole  40 mg Oral Daily  . tiotropium  18 mcg Inhalation Daily   Continuous Infusions:   PRN Meds:.ipratropium-albuterol  Micro Results Recent Results (from the past 240 hour(s))  Culture, blood (Routine X 2) w Reflex to ID Panel     Status: None (Preliminary result)   Collection Time: 09/25/15  4:58 AM  Result Value Ref Range Status   Specimen Description BLOOD RIGHT FOREARM  Final   Special Requests BOTTLES DRAWN AEROBIC AND ANAEROBIC  Final   Culture NO GROWTH 1 DAY  Final   Report  Status PENDING  Incomplete  Culture, blood (Routine X 2) w Reflex to ID Panel     Status: None (Preliminary result)   Collection Time: 09/25/15  5:58 AM  Result Value Ref Range Status   Specimen Description BLOOD RIGHT HAND  Final   Special Requests BOTTLES DRAWN AEROBIC AND ANAEROBIC  Final   Culture NO GROWTH 1 DAY  Final   Report Status PENDING  Incomplete  Urine culture     Status: None   Collection Time: 09/25/15  8:38 AM  Result Value Ref Range Status   Specimen Description URINE, CATHETERIZED  Final   Special Requests NONE  Final   Culture NO GROWTH  Final   Report Status 09/26/2015 FINAL  Final  MRSA PCR Screening     Status: None   Collection Time: 09/26/15  5:58 AM  Result Value Ref Range Status   MRSA by PCR NEGATIVE NEGATIVE Final    Comment:        The GeneXpert MRSA Assay (FDA approved for NASAL specimens only), is one component of  a comprehensive MRSA colonization surveillance program. It is not intended to diagnose MRSA infection nor to guide or monitor treatment for MRSA infections.     Radiology Reports Dg Chest 2 View  09/25/2015  CLINICAL DATA:  I initial evaluation for acute shortness of breath. EXAM: CHEST  2 VIEW COMPARISON:  Prior study from 02/20/2015. FINDINGS: Mild accentuation of the cardiac silhouette, likely related to AP technique. Mediastinal silhouette within normal limits. Atheromatous plaque within the intrathoracic aorta. Lungs are normally inflated. Changes related emphysema present. Mild left basilar subsegmental atelectasis/ scar noted. Irregular biapical pleural thickening, stable. No focal infiltrates. Hazy opacity overlying the lower spine on lateral projection favored to be related to overlying soft tissue attenuation. No pulmonary edema or pleural effusion. No pneumothorax. Remotely healed left-sided rib fractures. No acute osseous abnormality. Scoliosis. Osteopenia. IMPRESSION: 1. Emphysema.  No other active cardiopulmonary disease. 2.  Atherosclerosis. Electronically Signed   By: Rise Mu M.D.   On: 09/25/2015 05:26   Dg Chest Port 1 View  09/26/2015  CLINICAL DATA:  Short of breath.  COPD. EXAM: PORTABLE CHEST 1 VIEW COMPARISON:  Yesterday FINDINGS: Moderate cardiomegaly. Mild bibasilar atelectasis. Healing left lateral mid rib fractures. No sign of pulmonary edema or consolidation. No pneumothorax. No pleural effusion. IMPRESSION: Cardiomegaly without pulmonary edema. Bibasilar atelectasis. Electronically Signed   By: Jolaine Click M.D.   On: 09/26/2015 08:24    Time Spent in minutes  25 minutes   Gurkaran Rahm M.D on 09/27/2015 at 9:58 AM  Between 7am to 7pm - Pager - [phone]  After 7pm go to [email or web address] - password [facility]  [facility] -  Office  [phone]

## 2015-09-27 NOTE — NC FL2 (Signed)
[place] MEDICAID FL2 LEVEL OF CARE SCREENING TOOL     IDENTIFICATION  Patient Name: [name] Birthdate: [birth year] Sex: female Admission Date (Current Location): 09/25/2015  [facility] and [place] Number:  Producer, television/film/video and Address:  [facility], [address], [place], [place] [address]      Provider Number: [id number]  Attending Physician Name and Address:  Starleen Arms, MD  Relative Name and Phone Number:       Current Level of Care: Hospital Recommended Level of Care: Skilled Nursing Facility Prior Approval Number:    Date Approved/Denied:   PASRR Number: [id number]  Discharge Plan: SNF    Current Diagnoses: Patient Active Problem List   Diagnosis Date Noted  . Acute bronchitis with COPD (HCC) 09/25/2015  . Acute hyponatremia 09/25/2015  . Increased anion gap metabolic acidosis 09/25/2015  . Leukocytosis 09/25/2015  . Sepsis (HCC) 09/25/2015  . Hypokalemia 09/25/2015  . COPD exacerbation (HCC)   . Fracture, ribs 02/06/2015  . Acute on chronic respiratory failure with hypoxia (HCC) 02/06/2015  . Obesity   . Iron deficiency anemia   . Hypertension   . Calcification of right breast 02/28/2011  . Family history of breast cancer 02/10/2011  . Diabetes mellitus type II, uncontrolled (HCC) 04/01/2007  . MITRAL VALVE PROLAPSE 04/01/2007  . ASTHMA 04/01/2007  . COPD (chronic obstructive pulmonary disease) (HCC) 04/01/2007  . POSTMENOPAUSAL SYNDROME 04/01/2007  . Rheumatoid arthritis (HCC) 04/01/2007  . VERTIGO 04/01/2007  . OSA (obstructive sleep apnea) 04/01/2007  . PALPITATIONS, HX OF 04/01/2007    Orientation RESPIRATION BLADDER Height & Weight     Self, Time, Situation, Place  O2 (4L [place]) Continent Weight: 267 lb (121.11 kg) Height:  5\' 6"  (167.6 cm)  BEHAVIORAL SYMPTOMS/MOOD NEUROLOGICAL BOWEL NUTRITION STATUS      Continent Diet (carb modified)  AMBULATORY STATUS COMMUNICATION OF NEEDS Skin   Extensive Assist  Verbally Normal                       Personal Care Assistance Level of Assistance  Bathing, Dressing Bathing Assistance: Limited assistance   Dressing Assistance: Limited assistance     Functional Limitations Info             SPECIAL CARE FACTORS FREQUENCY  PT (By licensed PT), OT (By licensed OT)     PT Frequency: 5/wk OT Frequency: 5/wk            Contractures      Additional Factors Info  Code Status, Allergies, Insulin Sliding Scale Code Status Info: FULL Allergies Info: Gold-containing Drug Products, Tape   Insulin Sliding Scale Info: 6/day       Current Medications (09/27/2015):  This is the current hospital active medication list Current Facility-Administered Medications  Medication Dose Route Frequency Provider Last Rate Last Dose  . atenolol (TENORMIN) tablet 25 mg  25 mg Oral Daily 11/27/2015, MD   25 mg at 09/27/15 1101  . azithromycin (ZITHROMAX) 500 mg in dextrose 5 % 250 mL IVPB  500 mg Intravenous Q24H 11/27/15, NP   500 mg at 09/27/15 0820  . cefTRIAXone (ROCEPHIN) 1 g in dextrose 5 % 50 mL IVPB  1 g Intravenous Q24H 11/27/15, NP   1 g at 09/27/15 0820  . enoxaparin (LOVENOX) injection 40 mg  40 mg Subcutaneous Q24H 11/27/15, NP   40 mg at 09/26/15 2057  . ferrous sulfate tablet  325 mg  325 mg Oral Q breakfast Starleen Arms, MD   325 mg at 09/27/15 1101  . insulin aspart (novoLOG) injection 0-15 Units  0-15 Units Subcutaneous Q4H Russella Dar, NP   2 Units at 09/27/15 0825  . insulin glargine (LANTUS) injection 5 Units  5 Units Subcutaneous Daily Russella Dar, NP   5 Units at 09/27/15 1102  . ipratropium-albuterol (DUONEB) 0.5-2.5 (3) MG/3ML nebulizer solution 3 mL  3 mL Nebulization Q2H PRN Russella Dar, NP      . ipratropium-albuterol (DUONEB) 0.5-2.5 (3) MG/3ML nebulizer solution 3 mL  3 mL Nebulization QID Ozella Rocks, MD   3 mL at 09/27/15 0716  . LORazepam (ATIVAN) tablet 0.5 mg  0.5 mg Oral Q8H  PRN Starleen Arms, MD   0.5 mg at 09/27/15 1101  . methylPREDNISolone sodium succinate (SOLU-MEDROL) 40 mg/mL injection 40 mg  40 mg Intravenous BID Starleen Arms, MD   40 mg at 09/27/15 1101  . mometasone-formoterol (DULERA) 100-5 MCG/ACT inhaler 2 puff  2 puff Inhalation BID Russella Dar, NP   2 puff at 09/27/15 0716  . pantoprazole (PROTONIX) EC tablet 40 mg  40 mg Oral Daily Starleen Arms, MD   40 mg at 09/27/15 1102  . tiotropium (SPIRIVA) inhalation capsule 18 mcg  18 mcg Inhalation Daily Russella Dar, NP   18 mcg at 09/27/15 6967     Discharge Medications: Please see discharge summary for a list of discharge medications.  Relevant Imaging Results:  Relevant Lab Results:   Additional Information SS#: [id number]  Izora Ribas, [place]

## 2015-09-27 NOTE — Progress Notes (Signed)
Occupational Therapy Treatment Patient Details Name: [name] MRN: [id number] DOB: 16-Apr-1946 Today's Date: 09/27/2015    History of present illness This 70 y.o. female admitted to with SOB, weakness, fever, and generalized pain.  Dx: Sepsis, acute on chronic respiratory failrue with hypoxia/acute bronchitis.  PMH includes:  COPD, HTN, Depression, A-Fib, arthritis, ashtma, brain aneurysm, sleep apnea, RA, emphysema    OT comments  Pt with improved activity tolerance.  She demonstrated DOE 2/4 and was able to begin to self initiate energy conservation techniques.  02 sats remained > 90% on 4L supplemental 02  Follow Up Recommendations  SNF;Supervision/Assistance - 24 hour    Equipment Recommendations  None recommended by OT    Recommendations for Other Services      Precautions / Restrictions Precautions Precautions: Fall Precaution Comments: monitor 02 Restrictions Weight Bearing Restrictions: No       Mobility Bed Mobility               General bed mobility comments: sitting on EOB upon PT arrival  Transfers Overall transfer level: Needs assistance Equipment used: Rolling walker (2 wheeled) Transfers: Sit to/from [employer] Sit to Stand: Supervision Stand pivot transfers: Supervision       General transfer comment: Min guard when using RW w/ cues for hand placement.     Balance Overall balance assessment: Needs assistance Sitting-balance support: Feet supported Sitting balance-Leahy Scale: Good     Standing balance support: No upper extremity supported Standing balance-Leahy Scale: Fair Standing balance comment: Relies on UE support                   ADL Overall ADL's : Needs assistance/impaired     Grooming: Wash/dry hands;Wash/dry face;Oral care;Brushing hair;Supervision/safety;Standing                   Toilet Transfer: Supervision/safety;Ambulation;Comfort height toilet;Grab bars;RW   Toileting- Clothing  Manipulation and Hygiene: Supervision/safety;Sit to/from stand         General ADL Comments: 02 sats 90% or greater throughout session.  Discussed energy conservation, and pt able to self initiate rest breaks and pace self when ambulating and performing self care activities       Vision                     Perception     Praxis      Cognition   Behavior During Therapy: [facility] for tasks assessed/performed Overall Cognitive Status: Within Functional Limits for tasks assessed                       Extremity/Trunk Assessment               Exercises General Exercises - Upper Extremity Shoulder Flexion: AROM;Both;10 reps;Seated General Exercises - Lower Extremity Ankle Circles/Pumps: AROM;Both;10 reps;Seated Long Arc Quad: AROM;Both;10 reps;Seated Hip Flexion/Marching: AROM;Both;10 reps;Seated   Shoulder Instructions       General Comments      Pertinent Vitals/ Pain       Pain Assessment: No/denies pain  Home Living                                          Prior Functioning/Environment              Frequency Min 2X/week     Progress Toward Goals  OT Goals(current goals  can now be found in the care plan section)  Progress towards OT goals: Progressing toward goals  Acute Rehab OT Goals Patient Stated Goal: to get stronger   Plan Discharge plan remains appropriate    Co-evaluation                 End of Session Equipment Utilized During Treatment: Oxygen;Rolling walker   Activity Tolerance Patient tolerated treatment well   Patient Left in bed;with call bell/phone within reach   Nurse Communication Mobility status        Time: 3790-2409 OT Time Calculation (min): 13 min  Charges: OT General Charges $OT Visit: 1 Procedure OT Treatments $Therapeutic Activity: 8-22 mins  Kebrina Friend M 09/27/2015, 3:53 PM

## 2015-09-27 NOTE — Clinical Social Work Note (Signed)
Clinical Social Work Assessment  Patient Details  Name: [name] MRN: [id number] Date of Birth: 07/01/1945  Date of referral:  09/27/15               Reason for consult:  Facility Placement                Permission sought to share information with:  Facility Sport and exercise psychologist, Family Supports Permission granted to share information::  Yes, Verbal Permission Granted  Name::     [name]  Agency::  [facility] SNFs  Relationship::  sister  Contact Information:  [phone]  Housing/Transportation Living arrangements for the past 2 months:  [place] of Information:  Patient Patient Interpreter Needed:  None Criminal Activity/Legal Involvement Pertinent to Current Situation/Hospitalization:  No - Comment as needed Significant Relationships:  Siblings, Other Family Members Lives with:  Self Do you feel safe going back to the place where you live?  No Need for family participation in patient care:  No (Coment)  Care giving concerns:  CSW received referral for possible SNF placement at time of discharge. CSW met with patient regarding PT recommendation of SNF placement at time of discharge. Patient reports living alone and is unable to care for herself at home given patient's current physical needs and fall risk. Patient expressed understanding of PT recommendation and is agreeable to SNF placement at time of discharge. Patient's sister lives in a different state. Patient is concerned that her door at home is unlocked and she doesn't have any of her things with her. CSW provided patient with neighbor's phone number. CSW to continue to follow and assist with discharge planning needs.   Social Worker assessment / plan:  CSW spoke with patient concerning possibility of rehab at [facility] before returning home.  Employment status:  Retired Forensic scientist:  Medicare PT Recommendations:  [place] / Referral to community resources:  [place]  Patient/Family's Response to care:  Patient recognizes need for rehab before returning home and is agreeable to a SNF in [place].  Patient/Family's Understanding of and Emotional Response to Diagnosis, Current Treatment, and Prognosis:  Patient is realistic regarding therapy needs. No questions/concerns about plan or treatment.    Emotional Assessment Appearance:  Appears stated age Attitude/Demeanor/Rapport:   (Appropriate) Affect (typically observed):  Accepting, Appropriate Orientation:  Oriented to Situation, Oriented to  Time, Oriented to Place, Oriented to Self Alcohol / Substance use:  Not Applicable Psych involvement (Current and /or in the community):  No (Comment)  Discharge Needs  Concerns to be addressed:  Care Coordination Readmission within the last 30 days:  No Current discharge risk:  None Barriers to Discharge:  Continued Medical Work up   [employer], [place] 09/27/2015, 3:22 PM

## 2015-09-27 NOTE — Care Management Important Message (Signed)
Important Message  Patient Details  Name: [name] MRN: [id number] Date of Birth: 1945/11/26   Medicare Important Message Given:  Yes    Kyla Balzarine 09/27/2015, 10:16 AM

## 2015-09-28 DIAGNOSIS — J441 Chronic obstructive pulmonary disease with (acute) exacerbation: Secondary | ICD-10-CM

## 2015-09-28 DIAGNOSIS — I4891 Unspecified atrial fibrillation: Secondary | ICD-10-CM

## 2015-09-28 LAB — GLUCOSE, CAPILLARY
GLUCOSE-CAPILLARY: 150 mg/dL — AB (ref 65–99)
GLUCOSE-CAPILLARY: 162 mg/dL — AB (ref 65–99)
GLUCOSE-CAPILLARY: 235 mg/dL — AB (ref 65–99)
Glucose-Capillary: 169 mg/dL — ABNORMAL HIGH (ref 65–99)
Glucose-Capillary: 192 mg/dL — ABNORMAL HIGH (ref 65–99)
Glucose-Capillary: 221 mg/dL — ABNORMAL HIGH (ref 65–99)

## 2015-09-28 LAB — CBC
HEMATOCRIT: 30.1 % — AB (ref 36.0–46.0)
Hemoglobin: 8.7 g/dL — ABNORMAL LOW (ref 12.0–15.0)
MCH: 24.8 pg — ABNORMAL LOW (ref 26.0–34.0)
MCHC: 28.9 g/dL — AB (ref 30.0–36.0)
MCV: 85.8 fL (ref 78.0–100.0)
PLATELETS: 399 10*3/uL (ref 150–400)
RBC: 3.51 MIL/uL — ABNORMAL LOW (ref 3.87–5.11)
RDW: 15.3 % (ref 11.5–15.5)
WBC: 11.8 10*3/uL — ABNORMAL HIGH (ref 4.0–10.5)

## 2015-09-28 MED ORDER — ACETAMINOPHEN 325 MG PO TABS: 325.0000 mg | ORAL_TABLET | ORAL | Status: AC | PRN

## 2015-09-28 MED ORDER — LORAZEPAM 0.5 MG PO TABS: 0.5000 mg | ORAL_TABLET | Freq: Three times a day (TID) | ORAL | Status: AC | PRN

## 2015-09-28 MED ORDER — PREDNISONE 10 MG PO TABS: ORAL_TABLET | ORAL | Status: DC

## 2015-09-28 MED ORDER — LEVALBUTEROL HCL 0.63 MG/3ML IN NEBU
0.6300 mg | INHALATION_SOLUTION | Freq: Three times a day (TID) | RESPIRATORY_TRACT | Status: DC
  Administered 2015-09-29 – 2015-10-02 (×8): 0.63 mg via RESPIRATORY_TRACT
  Filled 2015-09-28 (×11): qty 3

## 2015-09-28 MED ORDER — LEVOFLOXACIN 500 MG PO TABS: 500.0000 mg | ORAL_TABLET | Freq: Every day | ORAL | Status: DC

## 2015-09-28 MED ORDER — ATENOLOL 50 MG PO TABS
50.0000 mg | ORAL_TABLET | Freq: Two times a day (BID) | ORAL | Status: DC
  Administered 2015-09-28: 50 mg via ORAL
  Filled 2015-09-28 (×2): qty 1

## 2015-09-28 MED ORDER — DIGOXIN 250 MCG PO TABS
0.2500 mg | ORAL_TABLET | Freq: Every day | ORAL | Status: DC
  Administered 2015-09-28: 0.25 mg via ORAL
  Filled 2015-09-28 (×2): qty 1

## 2015-09-28 MED ORDER — SPIRONOLACTONE-HCTZ 25-25 MG PO TABS: 1.0000 | ORAL_TABLET | Freq: Every day | ORAL | Status: DC

## 2015-09-28 MED ORDER — PREDNISONE 20 MG PO TABS
40.0000 mg | ORAL_TABLET | Freq: Every day | ORAL | Status: DC
  Administered 2015-09-29: 40 mg via ORAL
  Filled 2015-09-28: qty 2

## 2015-09-28 MED ORDER — HEPARIN BOLUS VIA INFUSION
4000.0000 [IU] | Freq: Once | INTRAVENOUS | Status: AC
  Administered 2015-09-28: 4000 [IU] via INTRAVENOUS
  Filled 2015-09-28: qty 4000

## 2015-09-28 MED ORDER — HEPARIN (PORCINE) IN NACL 100-0.45 UNIT/ML-% IJ SOLN
1500.0000 [IU]/h | INTRAMUSCULAR | Status: AC
  Administered 2015-09-28: 1250 [IU]/h via INTRAVENOUS
  Administered 2015-09-29 – 2015-10-02 (×4): 1500 [IU]/h via INTRAVENOUS
  Filled 2015-09-28 (×5): qty 250

## 2015-09-28 MED ORDER — TRAZODONE HCL 50 MG PO TABS
50.0000 mg | ORAL_TABLET | Freq: Every evening | ORAL | Status: DC | PRN
  Administered 2015-09-28 – 2015-10-01 (×4): 50 mg via ORAL
  Filled 2015-09-28 (×4): qty 1

## 2015-09-28 MED ORDER — LEVALBUTEROL HCL 0.63 MG/3ML IN NEBU: 0.6300 mg | INHALATION_SOLUTION | Freq: Three times a day (TID) | RESPIRATORY_TRACT | Status: DC

## 2015-09-28 MED ORDER — DILTIAZEM HCL 100 MG IV SOLR
5.0000 mg/h | INTRAVENOUS | Status: DC
  Administered 2015-09-28 (×2): 15 mg/h via INTRAVENOUS
  Administered 2015-09-28: 5 mg/h via INTRAVENOUS
  Administered 2015-09-29: 15 mg/h via INTRAVENOUS
  Filled 2015-09-28 (×4): qty 100

## 2015-09-28 NOTE — Progress Notes (Signed)
Pt's heart rate is between 140-170 even at rest. MD notified. Will continue to monitor

## 2015-09-28 NOTE — Progress Notes (Signed)
PROGRESS NOTE                                                                                                                                                                                                             Patient Demographics:    [name], is a 70 y.o. female, DOB - Oct 11, 1945, FMB:[id number]  Admit date - 09/25/2015   Admitting Physician Eduard Clos, MD  Outpatient Primary MD for the patient is [name], MD  LOS - 3  Chief Complaint  Patient presents with  . Respiratory Distress       Brief Narrative   70 y.o. female with a Past Medical History of cerebral aneurysm, HTN, Afib, Anemia, asthma, DM, adrenal mass, GERD, RA, depression, COPD, who presents with Sepsis and COPD exacerbation w/ likely CAP, With significant hypercapnia, requiring BiPAP initially , Significantly improved respiratory status, back on oxygen via nasal cannula , leukocytosis trending down, is likely due to to viral GI illnesses , And patient discharged to SNF, patient developed A. fib with RVR, started on Cardizem drip.    Subjective:    [name] today has, No headache, No chest pain, Denies any dyspnea or abdominal pain.  Assessment  & Plan :    Principal Problem:   Sepsis (HCC) Active Problems:   Diabetes mellitus type II, uncontrolled (HCC)   OSA (obstructive sleep apnea)   Obesity   Iron deficiency anemia   Acute on chronic respiratory failure with hypoxia (HCC)   Hypertension   Acute bronchitis with COPD (HCC)   Acute hyponatremia   Increased anion gap metabolic acidosis   Leukocytosis   Hypokalemia  Acute on chronic hypercapnic/hypoxic respiratory failure. - Patient with baseline COPD, on on home oxygen - Was significant hypercapnia,Initially requiring BiPAP, significantly improved, no further medial BiPAP of her last 24 hours. - due to COPD exacerbation, as well very likely acute bronchitis .  COPD exacerbation - Continue  with Rocephin and azithromycin - treatedwith IV steroids, In addition to by mouth prednisone - Continue with nebulizer treatment, Spiriva and Dulera  A. fib with RVR - Later during the morning patient developed heart rate 150s to 150 on telemetry monitor, peers to be A. fib with RVR, there is some mentioning in her chart of atrial fibrillation, but beside that I do not see any official documentation  or cardiology evaluation, patient denies any history of atrial fibrillation. - Will be started on Cardizem drip - D/W with Dr. Jacinto Halim will evaluate the patient today regarding further management and anticoagulation recommendation  Diabetes mellitus - Continue to hold metformin, continue with insulin sliding scale and Lantus.  SIRS - Patient presented with significant leukocytosis, elevated lactic acid, reports fever and abdominal pain at home, possibly related to viral GI infection, abdominal pain resolved, benign abdominal exam , leukocytosis trending down, no further diarrhea.  Anemia - Anemia of chronic disease , will being 11.9 on admission, currently 9 ,  most likely delusional factor contributing giving aggressive IV hydration, Hemoccult negative, iron level of 23, ferritin of the 98, normal folate and B-12 WNL .  Hypertension  - Acceptable on atenolol, continue to hold Aldactone/hydrochlorothiazide  Hyponatremia - Continue to hold Dyazide and Lasix, improving with IV fluids  Hypokalemia - Significantly improved, potassium level is 5 today, so we'll discontinue supplement  OSA - Does not use CPAP at home   Code Status : Full  Family Communication  : None at bedside  Disposition Plan  : We'll need SNF placement hopefully in 1-2 days.  Consults  :  none  Procedures  : none  DVT Prophylaxis  :  Lovenox   Lab Results  Component Value Date   PLT 399 09/28/2015    Antibiotics  :    Anti-infectives    Start     Dose/Rate Route Frequency Ordered Stop   09/29/15 0000   levofloxacin (LEVAQUIN) 500 MG tablet     500 mg Oral Daily 09/28/15 0956 09/30/15 2359   09/26/15 0800  cefTRIAXone (ROCEPHIN) 1 g in dextrose 5 % 50 mL IVPB     1 g 100 mL/hr over 30 Minutes Intravenous Every 24 hours 09/25/15 1551 10/03/15 0759   09/26/15 0800  azithromycin (ZITHROMAX) 500 mg in dextrose 5 % 250 mL IVPB     500 mg 250 mL/hr over 60 Minutes Intravenous Every 24 hours 09/25/15 1551 10/03/15 0759   09/25/15 0545  cefTRIAXone (ROCEPHIN) 2 g in dextrose 5 % 50 mL IVPB     2 g 100 mL/hr over 30 Minutes Intravenous  Once 09/25/15 0533 09/25/15 0808   09/25/15 0545  azithromycin (ZITHROMAX) 500 mg in dextrose 5 % 250 mL IVPB     500 mg 250 mL/hr over 60 Minutes Intravenous  Once 09/25/15 0533 09/25/15 1029        Objective:   Filed Vitals:   09/28/15 0751 09/28/15 0830 09/28/15 1000 09/28/15 1032  BP:  157/77    Pulse:  77 144 170  Temp:  98 F (36.7 C)    TempSrc:  Oral    Resp:  17 24 23   Height:      Weight:      SpO2: 100% 100% 96% 96%    Wt Readings from Last 3 Encounters:  09/25/15 121.11 kg (267 lb)  02/06/15 117.6 kg (259 lb 4.2 oz)  06/03/11 90.719 kg (200 lb)     Intake/Output Summary (Last 24 hours) at 09/28/15 1114 Last data filed at 09/28/15 0002  Gross per 24 hour  Intake    240 ml  Output   1000 ml  Net   -760 ml     Physical Exam   Awake Alert, Oriented X 3,  [place].AT,PERRAL Supple Neck,No JVD,  Symmetrical Chest wall movement, Good air movement bilaterally, CTAB IRR IRR,No Gallops,Rubs or new Murmurs, No Parasternal Heave +ve B.Sounds, Abd  Soft, No tenderness,  No rebound - guarding or rigidity. No Cyanosis, Clubbing or edema, No new Rash or bruise     Data Review:    CBC  Recent Labs Lab 09/25/15 0435 09/26/15 0714 09/27/15 0856 09/28/15 0218  WBC 26.3* 24.7* 15.6* 11.8*  HGB 11.9* 9.1* 9.0* 8.7*  HCT 38.1 30.6* 30.1* 30.1*  PLT 414* 350 362 399  MCV 82.6 86.2 85.0 85.8  MCH 25.8* 25.6* 25.4* 24.8*  MCHC 31.2 29.7*  29.9* 28.9*  RDW 14.8 15.1 15.0 15.3  LYMPHSABS 1.8  --   --   --   MONOABS 1.6*  --   --   --   EOSABS 0.0  --   --   --   BASOSABS 0.0  --   --   --     Chemistries   Recent Labs Lab 09/25/15 0435 09/25/15 0747 09/25/15 0900 09/26/15 0714 09/27/15 0430  NA 126* 127*  --  135 133*  K 3.6 3.0*  --  4.7 5.0  CL 78* 82*  --  95* 97*  CO2 30 27  --  31 27  GLUCOSE 205* 305*  --  112* 146*  BUN 24* 23*  --  13 18  CREATININE 1.70* 1.55*  --  1.05* 1.07*  CALCIUM 9.2 8.0*  --  8.5* 8.5*  MG  --   --  2.0  --   --    ------------------------------------------------------------------------------------------------------------------ No results for input(s): CHOL, HDL, LDLCALC, TRIG, CHOLHDL, LDLDIRECT in the last 72 hours.  Lab Results  Component Value Date   HGBA1C 7.7* 09/25/2015   ------------------------------------------------------------------------------------------------------------------ No results for input(s): TSH, T4TOTAL, T3FREE, THYROIDAB in the last 72 hours.  Invalid input(s): FREET3 ------------------------------------------------------------------------------------------------------------------  Recent Labs  09/26/15 0937  VITAMINB12 593  FOLATE 26.0  FERRITIN 398*  TIBC 237*  IRON 23*  RETICCTPCT 1.4    Coagulation profile  Recent Labs Lab 09/25/15 0900  INR 1.27    No results for input(s): DDIMER in the last 72 hours.  Cardiac Enzymes No results for input(s): CKMB, TROPONINI, MYOGLOBIN in the last 168 hours.  Invalid input(s): CK ------------------------------------------------------------------------------------------------------------------    Component Value Date/Time   BNP 95.4 09/25/2015 0435    Inpatient Medications  Scheduled Meds: . atenolol  25 mg Oral Daily  . azithromycin  500 mg Intravenous Q24H  . cefTRIAXone (ROCEPHIN)  IV  1 g Intravenous Q24H  . enoxaparin (LOVENOX) injection  40 mg Subcutaneous Q24H  . ferrous  sulfate  325 mg Oral Q breakfast  . insulin aspart  0-15 Units Subcutaneous Q4H  . insulin glargine  5 Units Subcutaneous Daily  . ipratropium-albuterol  3 mL Nebulization QID  . mometasone-formoterol  2 puff Inhalation BID  . pantoprazole  40 mg Oral Daily  . [START ON 09/29/2015] predniSONE  40 mg Oral Q breakfast  . tiotropium  18 mcg Inhalation Daily   Continuous Infusions: . diltiazem (CARDIZEM) infusion     PRN Meds:.ipratropium-albuterol, LORazepam  Micro Results Recent Results (from the past 240 hour(s))  Culture, blood (Routine X 2) w Reflex to ID Panel     Status: None (Preliminary result)   Collection Time: 09/25/15  4:58 AM  Result Value Ref Range Status   Specimen Description BLOOD RIGHT FOREARM  Final   Special Requests BOTTLES DRAWN AEROBIC AND ANAEROBIC  Final   Culture NO GROWTH 2 DAYS  Final   Report Status PENDING  Incomplete  Culture, blood (Routine X 2) w Reflex to  ID Panel     Status: None (Preliminary result)   Collection Time: 09/25/15  5:58 AM  Result Value Ref Range Status   Specimen Description BLOOD RIGHT HAND  Final   Special Requests BOTTLES DRAWN AEROBIC AND ANAEROBIC  Final   Culture NO GROWTH 2 DAYS  Final   Report Status PENDING  Incomplete  Urine culture     Status: None   Collection Time: 09/25/15  8:38 AM  Result Value Ref Range Status   Specimen Description URINE, CATHETERIZED  Final   Special Requests NONE  Final   Culture NO GROWTH  Final   Report Status 09/26/2015 FINAL  Final  MRSA PCR Screening     Status: None   Collection Time: 09/26/15  5:58 AM  Result Value Ref Range Status   MRSA by PCR NEGATIVE NEGATIVE Final    Comment:        The GeneXpert MRSA Assay (FDA approved for NASAL specimens only), is one component of a comprehensive MRSA colonization surveillance program. It is not intended to diagnose MRSA infection nor to guide or monitor treatment for MRSA infections.     Radiology Reports Dg Chest 2  View  09/25/2015  CLINICAL DATA:  I initial evaluation for acute shortness of breath. EXAM: CHEST  2 VIEW COMPARISON:  Prior study from 02/20/2015. FINDINGS: Mild accentuation of the cardiac silhouette, likely related to AP technique. Mediastinal silhouette within normal limits. Atheromatous plaque within the intrathoracic aorta. Lungs are normally inflated. Changes related emphysema present. Mild left basilar subsegmental atelectasis/ scar noted. Irregular biapical pleural thickening, stable. No focal infiltrates. Hazy opacity overlying the lower spine on lateral projection favored to be related to overlying soft tissue attenuation. No pulmonary edema or pleural effusion. No pneumothorax. Remotely healed left-sided rib fractures. No acute osseous abnormality. Scoliosis. Osteopenia. IMPRESSION: 1. Emphysema.  No other active cardiopulmonary disease. 2. Atherosclerosis. Electronically Signed   By: Rise Mu M.D.   On: 09/25/2015 05:26   Dg Chest Port 1 View  09/26/2015  CLINICAL DATA:  Short of breath.  COPD. EXAM: PORTABLE CHEST 1 VIEW COMPARISON:  Yesterday FINDINGS: Moderate cardiomegaly. Mild bibasilar atelectasis. Healing left lateral mid rib fractures. No sign of pulmonary edema or consolidation. No pneumothorax. No pleural effusion. IMPRESSION: Cardiomegaly without pulmonary edema. Bibasilar atelectasis. Electronically Signed   By: Jolaine Click M.D.   On: 09/26/2015 08:24    Time Spent in minutes  35 minutes   Richar Dunklee M.D on 09/28/2015 at 11:14 AM  Between 7am to 7pm - Pager - [phone]  After 7pm go to [email or web address] - password [facility]  [facility] -  Office  [phone]

## 2015-09-28 NOTE — Consult Note (Signed)
Patient with history of COPD due to prior tobacco use quit in 2006, 40 pack year history, uncontrolled diabetes mellitus, chronic anemia, moderate chronic renal insufficiency, stage III, morbid obesity, history of cerebral aneurysm status post aneurysm coiling in 2009, admitted in January 2009 with COPD exacerbation, was found to have probable atrial fibrillation at that time. She had no recurrence since then.   She was again admitted to [facility] on 09/25/2015 with community-acquired pneumonia and COPD exacerbation. She was being discharged today but developed atrial fibrillation with rapid ventricular response. I was asked to manage atrial fibrillation.  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 Endovascular coiling of the aneurysm performed on May 21, 2007, by Dr. [name] under general anesthesia. 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 Repeat coiling on 11/05/2007 and 01/14/2008, and again in 2010. She has partially recanalized basal apex aneurysm. She has not had any further follow-up from neurosurgical standpoint since 2011.  She denies any chest pain. Denies symptoms of claudication.  CARDIOLOGY CONSULT NOTE  Patient ID: [name] MRN: [id number] DOB/AGE: 08/08/1945 70 y.o.  Admit date: 09/25/2015 Referring Physician  Hector Shade, MD Primary Physician:  [name], MD Reason for Consultation  A. Fib  HPI: [name]  is a 70 y.o. female  with history of COPD due to prior tobacco use quit in 2006, 40 pack year history, uncontrolled diabetes mellitus, chronic anemia, history of cerebral aneurysm status post aneurysm coiling in 2009, admitted in January 2009 with COPD exacerbation, was found to have probable atrial fibrillation at that time. She had no recurrence since then. She was again admitted to [facility] on 09/25/2015 with  community-acquired pneumonia and COPD exacerbation. She was being discharged today but developed atrial fibrillation with rapid ventricular response. I was asked to manage atrial fibrillation.  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 Endovascular coiling of the aneurysm performed on May 21, 2007, by Dr. [name] under general anesthesia. 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 Repeat coiling on 11/05/2007 and 01/14/2008, and again in 2010. She has partially recanalized basal apex aneurysm. She has not had any further follow-up from neurosurgical standpoint since 2011.  She denies any chest pain. Denies symptoms of claudication. She felt palpitations this morning and states that she is occasional palpitations for many years work is helped by up to rule out. Each episode lasted a few minutes, she has 1-2 episodes a month. This morning temperature for rapid palpitations, she also had some mild chest discomfort in the form of heart throbbing severely.  Past Medical History  Diagnosis Date  . Cerebral aneurysm   . Hypertension   . Atrial fibrillation (HCC)   . Arthritis   . Anemia   . Asthma   . Aneurysm (HCC)     x2  . Complication of anesthesia     Has woken up during surgery before  . Brain aneurysm     have a "giant aneurysm, Dr. Bedelia Person has stented and coiled in past x 4  . Shortness of breath   . Bronchitis     history of  . Sleep apnea     does not use cpap  . Diabetes mellitus     metformin  . Adrenal disorder, other     "  adrenal Incidentaloma"  . Urinary tract infection     history of  . GERD (gastroesophageal reflux disease)     omeprazole  . Rheumatoid arthritis(714.0)     has had for approx. 40years, sees Dr. [name]  . Depression     due to loss of spouse 6 years ago  . COPD (chronic obstructive pulmonary disease) (HCC)     Dr.  [name], uses 2.5L of o2 as needed  . Emphysema of lung (HCC)     requested anethesia consult   . Obesity   . Acute respiratory failure (HCC) 02/05/2017     Past Surgical History  Procedure Laterality Date  . Tubal ligation    . Hand surgery      bilateral  . Hand surgery      x 2  . Brain surgery      coiling and stenting  . Mastectomy, partial  02/28/2011    Procedure: MASTECTOMY PARTIAL;  Surgeon: Ernestene Mention, MD;  Location: [facility] OR;  Service: General;  Laterality: Right;  RIGHT PARTIAL MASTECTOMY  WITH NEEDLE LOCALIZATION  . Breast surgery  02/28/11    right partial mastectomy      Family History  Problem Relation Age of Onset  . Heart disease Mother   . Cancer Father     lung  . Cancer Sister     breast     Social History: Social History   Social History  . Marital Status: Widowed    Spouse Name: N/A  . Number of Children: N/A  . Years of Education: N/A   Occupational History  . Not on file.   Social History Main Topics  . Smoking status: Former Smoker -- 1.50 packs/day for 40 years    Types: Cigarettes  . Smokeless tobacco: Never Used     Comment: quit 2006  . Alcohol Use: 0.6 oz/week    1 Shots of liquor per week  . Drug Use: No  . Sexual Activity: Not on file   Other Topics Concern  . Not on file   Social History Narrative     Prescriptions prior to admission  Medication Sig Dispense Refill Last Dose  . acetaminophen (TYLENOL) 500 MG tablet Take 1 tablet (500 mg total) by mouth 3 (three) times daily. 30 tablet 0 PRN  . albuterol (PROAIR HFA) 108 (90 BASE) MCG/ACT inhaler Inhale 2 puffs into the lungs every 6 (six) hours as needed. For shortness of breath   09/24/2015  . aspirin EC 81 MG tablet Take 81 mg by mouth daily.   09/24/2015  . atenolol (TENORMIN) 25 MG tablet Take 25 mg by mouth daily.   09/24/2015 at am  . budesonide-formoterol (SYMBICORT) 80-4.5 MCG/ACT inhaler Inhale 2 puffs into the lungs 2 (two) times daily. Reported on 09/25/2015    09/24/2015 at Unknown time  . Calcium 600-200 MG-UNIT tablet Take 1 tablet by mouth daily.   09/24/2015  . ferrous sulfate 325 (65 FE) MG tablet Take 325 mg by mouth daily with breakfast.   09/24/2015  . furosemide (LASIX) 20 MG tablet Take 20 mg by mouth daily.   09/24/2015  . ibandronate (BONIVA) 150 MG tablet Take 150 mg by mouth every 30 (thirty) days. Take in the morning with a full glass of water, on an empty stomach, and do not take anything else by mouth or lie down for the next 30 min.   09/20/2015  . ipratropium-albuterol (DUONEB) 0.5-2.5 (3) MG/3ML SOLN Take 3 mLs by nebulization 3 (  three) times daily. 360 mL  09/24/2015  . metFORMIN (GLUCOPHAGE) 500 MG tablet Take 500 mg by mouth 2 (two) times daily with a meal.     09/24/2015  . Multiple Vitamins-Minerals (CENTRUM SILVER ADULT 50+ PO) Take 1 tablet by mouth daily.   09/24/2015  . omeprazole (PRILOSEC) 20 MG capsule Take 20 mg by mouth daily.     09/24/2015  . oxybutynin (DITROPAN) 5 MG tablet Take 0.5 tablets (2.5 mg total) by mouth 2 (two) times daily. 20 tablet 0 09/24/2015  . OXYGEN Inhale 2.5 L into the lungs continuous.   09/24/2015  . polyethylene glycol (MIRALAX / GLYCOLAX) packet Take 17 g by mouth daily. 14 each 0 PRN  . senna-docusate (SENOKOT-S) 8.6-50 MG tablet Take 2 tablets by mouth at bedtime. 60 tablet 0 09/21/2015  . tiotropium (SPIRIVA) 18 MCG inhalation capsule Place 18 mcg into inhaler and inhale daily.    09/24/2015  . traZODone (DESYREL) 50 MG tablet Take 50 mg by mouth at bedtime.   09/23/2015  . [DISCONTINUED] LORazepam (ATIVAN) 0.5 MG tablet Take 1 tablet (0.5 mg total) by mouth every 8 (eight) hours as needed for anxiety. 10 tablet 0 2-3 days  . [DISCONTINUED] spironolactone-hydrochlorothiazide (ALDACTAZIDE) 25-25 MG per tablet Take 1 tablet by mouth daily.     09/24/2015     ROS: General: no fevers/chills/night sweats Eyes: no blurry vision, diplopia, or amaurosis ENT: no sore throat or hearing loss Resp: no cough, wheezing, or  hemoptysis CV: no edema or palpitations GI: no abdominal pain, nausea, vomiting, diarrhea, or constipation GU: no dysuria, frequency, or hematuria Skin: no rash Neuro: History of headaches, denies numbness, tingling, or weakness of extremities Musculoskeletal: Severe bilateral knee pain and arthritis, chronic back pain Heme: no bleeding, DVT, or easy bruising Endo: no polydipsia or polyuria   Physical Exam: Blood pressure 116/67, pulse 158, temperature 97.9 F (36.6 C), temperature source Oral, resp. rate 24, height 5\' 6"  (1.676 m), weight 121.11 kg (267 lb), SpO2 95 %. Body mass index is 43.12 kg/(m^2).   General appearance: alert, cooperative, appears older than stated age and morbidly obese Lungs: Prolonged expiration bilateral, mild expiratory wheeze present left base worse than the right. Heart: irregularly irregular rhythm, S1: variable, S2: normal, no rub and no murmur Abdomen: soft, non-tender; bowel sounds normal; no masses,  no organomegaly and obese with pannus (small) present Extremities: edema 2 plus bilateral below knee pitting Pulses: Bilateral carotids normal without bruit, femoral pulses faint due to obesity, popliteal pulse could not be felt due to obesity and pain from arthritis. DP 2+ bilateral, PT could not be felt, significant edema present. Neurologic: Grossly normal  Labs:   Lab Results  Component Value Date   WBC 11.8* 09/28/2015   HGB 8.7* 09/28/2015   HCT 30.1* 09/28/2015   MCV 85.8 09/28/2015   PLT 399 09/28/2015    Recent Labs Lab 09/27/15 0430  NA 133*  K 5.0  CL 97*  CO2 27  BUN 18  CREATININE 1.07*  CALCIUM 8.5*  GLUCOSE 146*    Recent Labs  02/07/15 0638 09/25/15 0435  BNP 132.1* 95.4    HEMOGLOBIN A1C Lab Results  Component Value Date   HGBA1C 7.7* 09/25/2015   MPG 174 09/25/2015    EKG: atrial fibrillation, rate Uncontrolled. Nonspecific T abnormality..  Echo: 02/08/2015: Left ventricle: The cavity size was normal.  Systolic function was normal. The estimated ejection fraction was in the range of 55% to 60%. Wall motion was normal; there  were no regional wall  motion abnormalities. - Left atrium: The atrium was mildly dilated.   Scheduled Meds: . atenolol  25 mg Oral Daily  . azithromycin  500 mg Intravenous Q24H  . cefTRIAXone (ROCEPHIN)  IV  1 g Intravenous Q24H  . enoxaparin (LOVENOX) injection  40 mg Subcutaneous Q24H  . ferrous sulfate  325 mg Oral Q breakfast  . insulin aspart  0-15 Units Subcutaneous Q4H  . insulin glargine  5 Units Subcutaneous Daily  . ipratropium-albuterol  3 mL Nebulization QID  . mometasone-formoterol  2 puff Inhalation BID  . pantoprazole  40 mg Oral Daily  . [START ON 09/29/2015] predniSONE  40 mg Oral Q breakfast  . tiotropium  18 mcg Inhalation Daily   Continuous Infusions: . diltiazem (CARDIZEM) infusion 15 mg/hr (09/28/15 1312)   PRN Meds:.ipratropium-albuterol, LORazepam  ASSESSMENT AND PLAN:  1. New onset atrial fibrillation with rapid ventricle response. Extensive review of chart also appears to have had A. fib sometime in 2009. Patient has chronic palpitations. CHA2DS2-VASCScore: Risk Score  4(without stroke as it was related to aneurysm),  Yearly risk of stroke  4%. 2. Hypertension 3. Hyperlipidemia 4. Diabetes mellitus type 2 uncontrolled without use of insulin 5. Morbid obesity 6. COPD with acute exacerbation, history of 40-pack-year history of smoking, quit 2006 7. History of cerebral aneurysm S/P coiling with residual aneurysm present. 8. Chronic iron deficiency anemia, patient has had extensive evaluation in the past, no obvious source has been found. She has had multiple GI evaluations in the past, follows [facility].  Recommendation: Extremely difficult situation and complex patient with multiple medical comorbidities, with new onset atrial fibrillation, she will need long-term anticoagulation. I had a long discussion with the patient  regarding the risk of bleeding especially in view of chronic anemia which is deficient. Also we have to clarify with neuroradiology, discussed with Dr. [name] regarding intracranial aneurysm and the risk of bleeding. He would prefer to perform cerebral angiography, which we plan to do it on Monday. I will keep her nothing by mouth from Sunday night for possible cerebral angiography.  I will start the patient on IV heparin. Increase beta blocker, continue IV diltiazem and also add digoxin for rate control for now. Eventually I'll plan to take off the digitoxin once rate control is achieved. This was a greater than 90 minute evaluation of all medical records, coordination of care face-to-face encounter and greater than 50% of the time was spent with face-to-face interaction.  [name], MD 09/28/2015, 2:44 PM [facility] Pager: [phone] Office: [phone] If no answer Cell [phone]

## 2015-09-28 NOTE — Progress Notes (Signed)
ANTICOAGULATION CONSULT NOTE - Initial Consult  Pharmacy Consult for heparin Indication: atrial fibrillation  Allergies  Allergen Reactions  . Gold-Containing Drug Products Other (See Comments)    Blisters   . Tape Dermatitis    Patient Measurements: Height: 5\' 6"  (167.6 cm) Weight: 267 lb (121.11 kg) IBW/kg (Calculated) : 59.3 Heparin Dosing Weight: 88.1 kg  Vital Signs: Temp: 97.9 F (36.6 C) (06/09 1249) Temp Source: Oral (06/09 1249) BP: 116/67 mmHg (06/09 1400) Pulse Rate: 158 (06/09 1400)  Labs:  Recent Labs  09/26/15 0714 09/27/15 0430 09/27/15 0856 09/28/15 0218  HGB 9.1*  --  9.0* 8.7*  HCT 30.6*  --  30.1* 30.1*  PLT 350  --  362 399  CREATININE 1.05* 1.07*  --   --     Estimated Creatinine Clearance: 65.8 mL/min (by C-G formula based on Cr of 1.07).   Medical History: Past Medical History  Diagnosis Date  . Cerebral aneurysm   . Hypertension   . Atrial fibrillation (HCC)   . Arthritis   . Anemia   . Asthma   . Aneurysm (HCC)     x2  . Complication of anesthesia     Has woken up during surgery before  . Brain aneurysm     have a "giant aneurysm, Dr. 11/28/15 has stented and coiled in past x 4  . Shortness of breath   . Bronchitis     history of  . Sleep apnea     does not use cpap  . Diabetes mellitus     metformin  . Adrenal disorder, other     "adrenal Incidentaloma"  . Urinary tract infection     history of  . GERD (gastroesophageal reflux disease)     omeprazole  . Rheumatoid arthritis(714.0)     has had for approx. 40years, sees Dr. Bedelia Person  . Depression     due to loss of spouse 6 years ago  . COPD (chronic obstructive pulmonary disease) (HCC)     Dr. [name], uses 2.5L of o2 as needed  . Emphysema of lung (HCC)     requested anethesia consult   . Obesity   . Acute respiratory failure (HCC) 02/05/2017    Medications:  Prescriptions prior to admission  Medication Sig Dispense Refill Last Dose  . acetaminophen  (TYLENOL) 500 MG tablet Take 1 tablet (500 mg total) by mouth 3 (three) times daily. 30 tablet 0 PRN  . albuterol (PROAIR HFA) 108 (90 BASE) MCG/ACT inhaler Inhale 2 puffs into the lungs every 6 (six) hours as needed. For shortness of breath   09/24/2015  . aspirin EC 81 MG tablet Take 81 mg by mouth daily.   09/24/2015  . atenolol (TENORMIN) 25 MG tablet Take 25 mg by mouth daily.   09/24/2015 at am  . budesonide-formoterol (SYMBICORT) 80-4.5 MCG/ACT inhaler Inhale 2 puffs into the lungs 2 (two) times daily. Reported on 09/25/2015   09/24/2015 at Unknown time  . Calcium 600-200 MG-UNIT tablet Take 1 tablet by mouth daily.   09/24/2015  . ferrous sulfate 325 (65 FE) MG tablet Take 325 mg by mouth daily with breakfast.   09/24/2015  . furosemide (LASIX) 20 MG tablet Take 20 mg by mouth daily.   09/24/2015  . ibandronate (BONIVA) 150 MG tablet Take 150 mg by mouth every 30 (thirty) days. Take in the morning with a full glass of water, on an empty stomach, and do not take anything else by mouth or lie down for the  next 30 min.   09/20/2015  . ipratropium-albuterol (DUONEB) 0.5-2.5 (3) MG/3ML SOLN Take 3 mLs by nebulization 3 (three) times daily. 360 mL  09/24/2015  . metFORMIN (GLUCOPHAGE) 500 MG tablet Take 500 mg by mouth 2 (two) times daily with a meal.     09/24/2015  . Multiple Vitamins-Minerals (CENTRUM SILVER ADULT 50+ PO) Take 1 tablet by mouth daily.   09/24/2015  . omeprazole (PRILOSEC) 20 MG capsule Take 20 mg by mouth daily.     09/24/2015  . oxybutynin (DITROPAN) 5 MG tablet Take 0.5 tablets (2.5 mg total) by mouth 2 (two) times daily. 20 tablet 0 09/24/2015  . OXYGEN Inhale 2.5 L into the lungs continuous.   09/24/2015  . polyethylene glycol (MIRALAX / GLYCOLAX) packet Take 17 g by mouth daily. 14 each 0 PRN  . senna-docusate (SENOKOT-S) 8.6-50 MG tablet Take 2 tablets by mouth at bedtime. 60 tablet 0 09/21/2015  . tiotropium (SPIRIVA) 18 MCG inhalation capsule Place 18 mcg into inhaler and inhale daily.    09/24/2015  .  traZODone (DESYREL) 50 MG tablet Take 50 mg by mouth at bedtime.   09/23/2015  . [DISCONTINUED] LORazepam (ATIVAN) 0.5 MG tablet Take 1 tablet (0.5 mg total) by mouth every 8 (eight) hours as needed for anxiety. 10 tablet 0 2-3 days  . [DISCONTINUED] spironolactone-hydrochlorothiazide (ALDACTAZIDE) 25-25 MG per tablet Take 1 tablet by mouth daily.     09/24/2015    Assessment: 70 y/o F presented on 09/25/2015 with respiratory distress.   PMH: cerebral aneurysm, HTN, Afib, Anemia, asthma, DM, adrenal mass, GERD, RA, depression, COPD  Anticoagulation: Patient developed afib 6/9. Hgb currently 8.7 (baseline 11.9). Plts 399 ok.  Goal of Therapy:  Heparin level 0.3-0.7 units/ml Monitor platelets by anticoagulation protocol: Yes   Plan:  Heparin 4000 unit IV bolus Heparin infusion at 1250 units/hr Check heparin level in 6-8 hrs. Daily HL and CBC   Jovon Streetman S. Merilynn Finland, PharmD, BCPS Clinical Staff Pharmacist Pager [phone]  Misty Stanley Stillinger 09/28/2015,3:31 PM

## 2015-09-28 NOTE — Discharge Summary (Deleted)
[name], is a 70 y.o. female  DOB 1945-06-06  MRN [id number].  Admission date:  09/25/2015  Admitting Physician  Eduard Clos, MD  Discharge Date:  09/28/2015   Primary MD  [name], MD  Recommendations for primary care physician for things to follow:  - Please check CBC, BMP in 3 days - Patient on nasal cannula 2.5-3 L continuous   Admission Diagnosis  COPD exacerbation (HCC) [J44.1]   Discharge Diagnosis  COPD exacerbation (HCC) [J44.1]    Principal Problem:   Sepsis (HCC) Active Problems:   Diabetes mellitus type II, uncontrolled (HCC)   OSA (obstructive sleep apnea)   Obesity   Iron deficiency anemia   Acute on chronic respiratory failure with hypoxia (HCC)   Hypertension   Acute bronchitis with COPD (HCC)   Acute hyponatremia   Increased anion gap metabolic acidosis   Leukocytosis   Hypokalemia      Past Medical History  Diagnosis Date  . Cerebral aneurysm   . Hypertension   . Atrial fibrillation (HCC)   . Arthritis   . Anemia   . Asthma   . Aneurysm (HCC)     x2  . Complication of anesthesia     Has woken up during surgery before  . Brain aneurysm     have a "giant aneurysm, Dr. Bedelia Person has stented and coiled in past x 4  . Shortness of breath   . Bronchitis     history of  . Sleep apnea     does not use cpap  . Diabetes mellitus     metformin  . Adrenal disorder, other     "adrenal Incidentaloma"  . Urinary tract infection     history of  . GERD (gastroesophageal reflux disease)     omeprazole  . Rheumatoid arthritis(714.0)     has had for approx. 40years, sees Dr. [name]  . Depression     due to loss of spouse 6 years ago  . COPD (chronic obstructive pulmonary disease) (HCC)     Dr. [name], uses 2.5L of o2 as needed  . Emphysema of lung (HCC)     requested anethesia consult   . Obesity   . Acute respiratory failure (HCC) 02/05/2017     Past Surgical History  Procedure Laterality Date  . Tubal ligation    . Hand surgery      bilateral  . Hand surgery      x 2  . Brain surgery      coiling and stenting  . Mastectomy, partial  02/28/2011    Procedure: MASTECTOMY PARTIAL;  Surgeon: Ernestene Mention, MD;  Location: [facility];  Service: General;  Laterality: Right;  RIGHT PARTIAL MASTECTOMY  WITH NEEDLE LOCALIZATION  . Breast surgery  02/28/11    right partial mastectomy        History of present illness and  Hospital Course:     Kindly see H&P for history of present illness and admission details, please review complete Labs, Consult reports  and Test reports for all details in brief  HPI  from the history and physical done on the day of admission 09/25/2015  HPI: [name] is a 70 y.o. female with medical history significant for COPD on oxygen 3 L/m, sleep apnea patient reports not on CPAP, hypertension, diabetes on metformin, obesity, iron deficiency anemia who presented to the ER with reports of shortness of breath, weakness, fever and generalized pain. EMS was called to the patient's home and they found the patient with a heart rate between 131 40 bpm a low-grade temperature of 99 with a respiratory rate of 25. She was hypertensive with a BP 150/79 and pulse oximetry was 92% on 4 L nasal cannula oxygen. She was given albuterol and Atrovent nebulizers was 125 mg of Solu-Medrol prior to arrival to this facility.  Upon my evaluation of the patient she was quite lethargic and difficult to arouse. Because of this history is limited. Patient reports she has been sick for about 2 weeks but did not seek any medical attention from her primary care physician. She reports coughing up thick sputum but was unable to truly characterize the color of the sputum. She denied nausea or vomiting or GI symptoms. She denied genitourinary symptoms.  ED Course:  Initial vital signs: Rectal temp 100.5-BP 140/66-pulse 137-respirations 32-100  percent nonrebreather mask with 99% saturations Repeat vital signs: BP 144/64-pulse 108-respirations 28-O2 sats 89% with patient noticed to be mouth breathing with nasal cannula in place 2 view chest x-ray: COPD without any infiltrates or edema Lab data: Sodium 126, potassium 3.6, chloride 78, BUN 24, creatinine 1.7, glucose 205, anion gap 18, BNP 95, troponin point-of-care 0.00, lactic acid 1.69, WBC 26,300 with neutrophils 87% and absolute neutrophils 23%, hemoglobin 11.9, platelets 414,000 ABG on 4 L oxygen: PH 7.39-PCO2 48.9-PO2 88-bicarbonate 29.2-T CO2 31-ABE 4.0 Tylenol 1000 mg by mouth 1 Rocephin 2 g IV 1 Magnesium sulfate 2 g IV 1  Hospital Course  70 y.o. female with a Past Medical History of cerebral aneurysm, HTN, Afib, Anemia, asthma, DM, adrenal mass, GERD, RA, depression, COPD, who presents with Sepsis and COPD exacerbation w/ likely CAP, With significant hypercapnia, requiring BiPAP initially , Significantly improved respiratory status, back on oxygen via nasal cannula , leukocytosis trending down,  likely due to to viral GI illnesses .  Acute on chronic hypercapnic/hypoxic respiratory failure. - Patient with baseline COPD, on on home oxygen - Was significant hypercapnia,Initially requiring BiPAP, significantly improved, no further medial BiPAP over last 48 hours. - due to COPD exacerbation, as well very likely acute bronchitis . - Resolved, back to baseline.  COPD exacerbation - Treated with Rocephin and azithromycin - Treated with IV steroids, will be discharged on prednisone taper. - Continue with nebulizer treatment, Spiriva and Symbicort on discharge  Acute bronchitis - Treated with Rocephin and azithromycin, to finish another 3 days of levofloxacin  Diabetes mellitus - Resume metformin on discharge  SIRS - Patient presented with significant leukocytosis, elevated lactic acid, reports fever and abdominal pain at home, possibly related to viral GI infection,  abdominal pain resolved, benign abdominal exam , leukocytosis trending down, no further diarrhea.  Anemia - Anemia of chronic disease , will being 11.9 on admission, currently 9 , most likely delusional factor contributing giving aggressive IV hydration, Hemoccult negative, iron level of 23, ferritin of the 98, normal folate and B-12 level.  Hypertension  - Resumed atenolol, resume Aldacazide in 48 hours  Hyponatremia - Improved with IV fluid  Hypokalemia -  Significantly improved,     Discharge Condition:  Stable   Follow UP  Follow-up Information    Follow up with [name], MD.   Specialty:  Internal Medicine   Contact information:   [address] [place] [address] [phone]         Discharge Instructions  and  Discharge Medications        Discharge Instructions    Discharge instructions    Complete by:  As directed   Follow with Primary MD [name], MD after discharge from SNF  Get CBC, CMP, 2 view Chest X ray checked  by Primary MD next visit.    Activity: As tolerated with Full fall precautions use walker/cane & assistance as needed   Disposition SNF   Diet: Heart Healthy , carbohydrate modified , with feeding assistance and aspiration precautions.  For Heart failure patients - Check your Weight same time everyday, if you gain over 2 pounds, or you develop in leg swelling, experience more shortness of breath or chest pain, call your Primary MD immediately. Follow Cardiac Low Salt Diet and 1.5 lit/day fluid restriction.   On your next visit with your primary care physician please Get Medicines reviewed and adjusted.   Please request your Prim.MD to go over all Hospital Tests and Procedure/Radiological results at the follow up, please get all Hospital records sent to your Prim MD by signing hospital release before you go home.   If you experience worsening of your admission symptoms, develop shortness of breath,  life threatening emergency, suicidal or homicidal thoughts you must seek medical attention immediately by calling 911 or calling your MD immediately  if symptoms less severe.  You Must read complete instructions/literature along with all the possible adverse reactions/side effects for all the Medicines you take and that have been prescribed to you. Take any new Medicines after you have completely understood and accpet all the possible adverse reactions/side effects.   Do not drive, operating heavy machinery, perform activities at heights, swimming or participation in water activities or provide baby sitting services if your were admitted for syncope or siezures until you have seen by Primary MD or a Neurologist and advised to do so again.  Do not drive when taking Pain medications.    Do not take more than prescribed Pain, Sleep and Anxiety Medications  Special Instructions: If you have smoked or chewed Tobacco  in the last 2 yrs please stop smoking, stop any regular Alcohol  and or any Recreational drug use.  Wear Seat belts while driving.   Please note  You were cared for by a hospitalist during your hospital stay. If you have any questions about your discharge medications or the care you received while you were in the hospital after you are discharged, you can call the unit and asked to speak with the hospitalist on call if the hospitalist that took care of you is not available. Once you are discharged, your primary care physician will handle any further medical issues. Please note that NO REFILLS for any discharge medications will be authorized once you are discharged, as it is imperative that you return to your primary care physician (or establish a relationship with a primary care physician if you do not have one) for your aftercare needs so that they can reassess your need for medications and monitor your lab values.            Medication List    TAKE these medications  acetaminophen 325 MG tablet  Commonly known as:  TYLENOL  Take 1 tablet (325 mg total) by mouth every 4 (four) hours as needed for mild pain, moderate pain, fever or headache.     aspirin EC 81 MG tablet  Take 81 mg by mouth daily.     atenolol 25 MG tablet  Commonly known as:  TENORMIN  Take 25 mg by mouth daily.     budesonide-formoterol 80-4.5 MCG/ACT inhaler  Commonly known as:  SYMBICORT  Inhale 2 puffs into the lungs 2 (two) times daily. Reported on 09/25/2015     Calcium 600-200 MG-UNIT tablet  Take 1 tablet by mouth daily.     CENTRUM SILVER ADULT 50+ PO  Take 1 tablet by mouth daily.     ferrous sulfate 325 (65 FE) MG tablet  Take 325 mg by mouth daily with breakfast.     furosemide 20 MG tablet  Commonly known as:  LASIX  Take 20 mg by mouth daily.     ibandronate 150 MG tablet  Commonly known as:  BONIVA  Take 150 mg by mouth every 30 (thirty) days. Take in the morning with a full glass of water, on an empty stomach, and do not take anything else by mouth or lie down for the next 30 min.     ipratropium-albuterol 0.5-2.5 (3) MG/3ML Soln  Commonly known as:  DUONEB  Take 3 mLs by nebulization 3 (three) times daily.     levofloxacin 500 MG tablet  Commonly known as:  LEVAQUIN  Take 1 tablet (500 mg total) by mouth daily. Please take for 3 days and stop  Start taking on:  09/29/2015     LORazepam 0.5 MG tablet  Commonly known as:  ATIVAN  Take 1 tablet (0.5 mg total) by mouth every 8 (eight) hours as needed for anxiety.     metFORMIN 500 MG tablet  Commonly known as:  GLUCOPHAGE  Take 500 mg by mouth 2 (two) times daily with a meal.     omeprazole 20 MG capsule  Commonly known as:  PRILOSEC  Take 20 mg by mouth daily.     oxybutynin 5 MG tablet  Commonly known as:  DITROPAN  Take 0.5 tablets (2.5 mg total) by mouth 2 (two) times daily.     OXYGEN  Inhale 2.5 L into the lungs continuous.     polyethylene glycol packet  Commonly known as:  MIRALAX /  GLYCOLAX  Take 17 g by mouth daily.     predniSONE 10 MG tablet  Commonly known as:  DELTASONE  Please take 40 mg daily for 2 days, then 30 mg daily for 2 days, then 20 mg daily for 2 days, then 10 mg daily for 2 days, then stop.     PROAIR HFA 108 (90 Base) MCG/ACT inhaler  Generic drug:  albuterol  Inhale 2 puffs into the lungs every 6 (six) hours as needed. For shortness of breath     senna-docusate 8.6-50 MG tablet  Commonly known as:  Senokot-S  Take 2 tablets by mouth at bedtime.     spironolactone-hydrochlorothiazide 25-25 MG tablet  Commonly known as:  ALDACTAZIDE  Take 1 tablet by mouth daily.  Start taking on:  09/30/2015     tiotropium 18 MCG inhalation capsule  Commonly known as:  SPIRIVA  Place 18 mcg into inhaler and inhale daily.     traZODone 50 MG tablet  Commonly known as:  DESYREL  Take 50 mg by mouth  at bedtime.          Diet and Activity recommendation: See Discharge Instructions above   Consults obtained -  None   Major procedures and Radiology Reports - PLEASE review detailed and final reports for all details, in brief -      Dg Chest 2 View  09/25/2015  CLINICAL DATA:  I initial evaluation for acute shortness of breath. EXAM: CHEST  2 VIEW COMPARISON:  Prior study from 02/20/2015. FINDINGS: Mild accentuation of the cardiac silhouette, likely related to AP technique. Mediastinal silhouette within normal limits. Atheromatous plaque within the intrathoracic aorta. Lungs are normally inflated. Changes related emphysema present. Mild left basilar subsegmental atelectasis/ scar noted. Irregular biapical pleural thickening, stable. No focal infiltrates. Hazy opacity overlying the lower spine on lateral projection favored to be related to overlying soft tissue attenuation. No pulmonary edema or pleural effusion. No pneumothorax. Remotely healed left-sided rib fractures. No acute osseous abnormality. Scoliosis. Osteopenia. IMPRESSION: 1. Emphysema.  No other  active cardiopulmonary disease. 2. Atherosclerosis. Electronically Signed   By: Rise Mu M.D.   On: 09/25/2015 05:26   Dg Chest Port 1 View  09/26/2015  CLINICAL DATA:  Short of breath.  COPD. EXAM: PORTABLE CHEST 1 VIEW COMPARISON:  Yesterday FINDINGS: Moderate cardiomegaly. Mild bibasilar atelectasis. Healing left lateral mid rib fractures. No sign of pulmonary edema or consolidation. No pneumothorax. No pleural effusion. IMPRESSION: Cardiomegaly without pulmonary edema. Bibasilar atelectasis. Electronically Signed   By: Jolaine Click M.D.   On: 09/26/2015 08:24    Micro Results    Recent Results (from the past 240 hour(s))  Culture, blood (Routine X 2) w Reflex to ID Panel     Status: None (Preliminary result)   Collection Time: 09/25/15  4:58 AM  Result Value Ref Range Status   Specimen Description BLOOD RIGHT FOREARM  Final   Special Requests BOTTLES DRAWN AEROBIC AND ANAEROBIC  Final   Culture NO GROWTH 2 DAYS  Final   Report Status PENDING  Incomplete  Culture, blood (Routine X 2) w Reflex to ID Panel     Status: None (Preliminary result)   Collection Time: 09/25/15  5:58 AM  Result Value Ref Range Status   Specimen Description BLOOD RIGHT HAND  Final   Special Requests BOTTLES DRAWN AEROBIC AND ANAEROBIC  Final   Culture NO GROWTH 2 DAYS  Final   Report Status PENDING  Incomplete  Urine culture     Status: None   Collection Time: 09/25/15  8:38 AM  Result Value Ref Range Status   Specimen Description URINE, CATHETERIZED  Final   Special Requests NONE  Final   Culture NO GROWTH  Final   Report Status 09/26/2015 FINAL  Final  MRSA PCR Screening     Status: None   Collection Time: 09/26/15  5:58 AM  Result Value Ref Range Status   MRSA by PCR NEGATIVE NEGATIVE Final    Comment:        The GeneXpert MRSA Assay (FDA approved for NASAL specimens only), is one component of a comprehensive MRSA colonization surveillance program. It is not intended to  diagnose MRSA infection nor to guide or monitor treatment for MRSA infections.        Today   Subjective:   [name] today has no headache,no chest or  abdominal pain,no new weakness tingling or numbness, feels much better today.   Objective:   Blood pressure 157/77, pulse 77, temperature 98 F (36.7 C), temperature source Oral,  resp. rate 17, height 5\' 6"  (1.676 m), weight 121.11 kg (267 lb), SpO2 100 %.   Intake/Output Summary (Last 24 hours) at 09/28/15 1004 Last data filed at 09/28/15 0002  Gross per 24 hour  Intake    240 ml  Output   1000 ml  Net   -760 ml    Exam Awake Alert, Oriented x 3, No new F.N deficits, Normal affect [place].AT,PERRAL Supple Neck,No JVD, No cervical lymphadenopathy appriciated.  Symmetrical Chest wall movement, Good air movement bilaterally, CTAB RRR,No Gallops,Rubs or new Murmurs, No Parasternal Heave +ve B.Sounds, Abd Soft, Non tender, No organomegaly appriciated, No rebound -guarding or rigidity. No Cyanosis, Clubbing or edema, No new Rash or bruise  Data Review   CBC w Diff:  Lab Results  Component Value Date   WBC 11.8* 09/28/2015   HGB 8.7* 09/28/2015   HCT 30.1* 09/28/2015   PLT 399 09/28/2015   LYMPHOPCT 7 09/25/2015   MONOPCT 6 09/25/2015   EOSPCT 0 09/25/2015   BASOPCT 0 09/25/2015    CMP:  Lab Results  Component Value Date   NA 133* 09/27/2015   K 5.0 09/27/2015   CL 97* 09/27/2015   CO2 27 09/27/2015   BUN 18 09/27/2015   CREATININE 1.07* 09/27/2015   PROT 7.7 06/03/2011   ALBUMIN 3.8 06/03/2011   BILITOT 0.3 06/03/2011   ALKPHOS 59 06/03/2011   AST 13 06/03/2011   ALT 9 06/03/2011  .   Total Time in preparing paper work, data evaluation and todays exam - 35 minutes  [name] M.D on 09/28/2015 at 10:04 AM  [facility]   Office  [phone]

## 2015-09-29 ENCOUNTER — Inpatient Hospital Stay (HOSPITAL_COMMUNITY): Payer: Medicare Other | Admitting: Anesthesiology

## 2015-09-29 ENCOUNTER — Encounter (HOSPITAL_COMMUNITY)
Admission: EM | Disposition: A | Discharge status: Skilled Nursing Facility | Payer: Self-pay | Source: Home / Self Care | Attending: Internal Medicine

## 2015-09-29 HISTORY — PX: CARDIOVERSION: SHX1299

## 2015-09-29 LAB — BASIC METABOLIC PANEL
Anion gap: 7 (ref 5–15)
BUN: 20 mg/dL (ref 6–20)
CALCIUM: 8.8 mg/dL — AB (ref 8.9–10.3)
CHLORIDE: 100 mmol/L — AB (ref 101–111)
CO2: 32 mmol/L (ref 22–32)
CREATININE: 1 mg/dL (ref 0.44–1.00)
GFR, EST NON AFRICAN AMERICAN: 56 mL/min — AB (ref >60)
Glucose, Bld: 150 mg/dL — ABNORMAL HIGH (ref 65–99)
Potassium: 4.2 mmol/L (ref 3.5–5.1)
SODIUM: 139 mmol/L (ref 135–145)

## 2015-09-29 LAB — GLUCOSE, CAPILLARY
GLUCOSE-CAPILLARY: 147 mg/dL — AB (ref 65–99)
GLUCOSE-CAPILLARY: 250 mg/dL — AB (ref 65–99)
GLUCOSE-CAPILLARY: 130 mg/dL — AB (ref 65–99)
GLUCOSE-CAPILLARY: 196 mg/dL — AB (ref 65–99)
Glucose-Capillary: 114 mg/dL — ABNORMAL HIGH (ref 65–99)
Glucose-Capillary: 119 mg/dL — ABNORMAL HIGH (ref 65–99)
Glucose-Capillary: 147 mg/dL — ABNORMAL HIGH (ref 65–99)

## 2015-09-29 LAB — MAGNESIUM: MAGNESIUM: 1.4 mg/dL — AB (ref 1.7–2.4)

## 2015-09-29 LAB — CBC
HEMATOCRIT: 31.9 % — AB (ref 36.0–46.0)
Hemoglobin: 9.2 g/dL — ABNORMAL LOW (ref 12.0–15.0)
MCH: 25 pg — ABNORMAL LOW (ref 26.0–34.0)
MCHC: 28.8 g/dL — AB (ref 30.0–36.0)
MCV: 86.7 fL (ref 78.0–100.0)
PLATELETS: 430 10*3/uL — AB (ref 150–400)
RBC: 3.68 MIL/uL — ABNORMAL LOW (ref 3.87–5.11)
RDW: 15.5 % (ref 11.5–15.5)
WBC: 14.9 10*3/uL — AB (ref 4.0–10.5)

## 2015-09-29 LAB — HEPARIN LEVEL (UNFRACTIONATED)
HEPARIN UNFRACTIONATED: 0.52 [IU]/mL (ref 0.30–0.70)
HEPARIN UNFRACTIONATED: 0.3 [IU]/mL (ref 0.30–0.70)
Heparin Unfractionated: 0.17 IU/mL — ABNORMAL LOW (ref 0.30–0.70)

## 2015-09-29 SURGERY — CARDIOVERSION
Anesthesia: General

## 2015-09-29 MED ORDER — MEPERIDINE HCL 25 MG/ML IJ SOLN: 6.2500 mg | INTRAMUSCULAR | Status: DC | PRN

## 2015-09-29 MED ORDER — ATENOLOL 25 MG PO TABS
25.0000 mg | ORAL_TABLET | Freq: Two times a day (BID) | ORAL | Status: DC
  Administered 2015-09-29 – 2015-09-30 (×2): 25 mg via ORAL
  Filled 2015-09-29 (×5): qty 1

## 2015-09-29 MED ORDER — SOTALOL HCL 80 MG PO TABS
80.0000 mg | ORAL_TABLET | Freq: Two times a day (BID) | ORAL | Status: DC
  Administered 2015-09-29: 80 mg via ORAL
  Filled 2015-09-29: qty 1

## 2015-09-29 MED ORDER — SODIUM CHLORIDE 0.9% FLUSH: 3.0000 mL | INTRAVENOUS | Status: DC | PRN

## 2015-09-29 MED ORDER — MAGNESIUM SULFATE 2 GM/50ML IV SOLN
2.0000 g | Freq: Once | INTRAVENOUS | Status: AC
  Administered 2015-09-29: 2 g via INTRAVENOUS
  Filled 2015-09-29: qty 50

## 2015-09-29 MED ORDER — SODIUM CHLORIDE 0.9 % IV SOLN
INTRAVENOUS | Status: DC | PRN
  Administered 2015-09-29: 09:00:00 via INTRAVENOUS

## 2015-09-29 MED ORDER — PROPOFOL 10 MG/ML IV BOLUS
INTRAVENOUS | Status: DC | PRN
  Administered 2015-09-29: 100 mg via INTRAVENOUS

## 2015-09-29 MED ORDER — HYDROMORPHONE HCL 1 MG/ML IJ SOLN: 0.2500 mg | INTRAMUSCULAR | Status: DC | PRN

## 2015-09-29 MED ORDER — HEPARIN BOLUS VIA INFUSION
2500.0000 [IU] | Freq: Once | INTRAVENOUS | Status: AC
  Administered 2015-09-29: 2500 [IU] via INTRAVENOUS
  Filled 2015-09-29: qty 2500

## 2015-09-29 MED ORDER — PREDNISONE 20 MG PO TABS
30.0000 mg | ORAL_TABLET | Freq: Every day | ORAL | Status: DC
  Administered 2015-09-30: 30 mg via ORAL
  Filled 2015-09-29: qty 1

## 2015-09-29 MED ORDER — DILTIAZEM HCL ER COATED BEADS 180 MG PO CP24
180.0000 mg | ORAL_CAPSULE | Freq: Two times a day (BID) | ORAL | Status: DC
  Administered 2015-09-29 – 2015-09-30 (×4): 180 mg via ORAL
  Filled 2015-09-29 (×4): qty 1

## 2015-09-29 MED ORDER — SODIUM CHLORIDE 0.9% FLUSH
3.0000 mL | Freq: Two times a day (BID) | INTRAVENOUS | Status: DC
  Administered 2015-09-29 – 2015-10-01 (×5): 3 mL via INTRAVENOUS

## 2015-09-29 MED ORDER — SODIUM CHLORIDE 0.9 % IV SOLN
250.0000 mL | INTRAVENOUS | Status: DC
  Administered 2015-09-29: 250 mL via INTRAVENOUS

## 2015-09-29 MED ORDER — ASPIRIN EC 81 MG PO TBEC
81.0000 mg | DELAYED_RELEASE_TABLET | Freq: Every day | ORAL | Status: DC
  Administered 2015-09-29 – 2015-09-30 (×2): 81 mg via ORAL
  Filled 2015-09-29 (×2): qty 1

## 2015-09-29 MED ORDER — ONDANSETRON HCL 4 MG/2ML IJ SOLN
4.0000 mg | Freq: Once | INTRAMUSCULAR | Status: DC | PRN
Start: 2015-09-29 — End: 2015-10-02

## 2015-09-29 NOTE — Anesthesia Postprocedure Evaluation (Signed)
Anesthesia Post Note  Patient: [name]  Procedure(s) Performed: Procedure(s) (LRB): CARDIOVERSION (N/A)  Patient location during evaluation: PACU Anesthesia Type: General Level of consciousness: awake and alert Pain management: pain level controlled Vital Signs Assessment: post-procedure vital signs reviewed and stable Respiratory status: spontaneous breathing, nonlabored ventilation, respiratory function stable and patient connected to nasal cannula oxygen Cardiovascular status: blood pressure returned to baseline and stable Postop Assessment: no signs of nausea or vomiting Anesthetic complications: no    Last Vitals:  Filed Vitals:   09/29/15 0800 09/29/15 0900  BP: 79/61 106/57  Pulse: 120 60  Temp: 36.2 C 36.5 C  Resp: 24 15    Last Pain: There were no vitals filed for this visit.               Mikeya Tomasetti DAVID

## 2015-09-29 NOTE — Progress Notes (Signed)
ANTICOAGULATION CONSULT NOTE - Follow Up Consult  Pharmacy Consult for Heparin  Indication: atrial fibrillation  Allergies  Allergen Reactions  . Gold-Containing Drug Products Other (See Comments)    Blisters   . Tape Dermatitis    Patient Measurements: Height: 5\' 6"  (167.6 cm) Weight: 267 lb (121.11 kg) IBW/kg (Calculated) : 59.3  Vital Signs: Temp: 97.7 F (36.5 C) (06/09 2000) Temp Source: Oral (06/09 2000) BP: 109/58 mmHg (06/09 2100) Pulse Rate: 156 (06/09 2100)  Labs:  Recent Labs  09/26/15 0714 09/27/15 0430 09/27/15 0856 09/28/15 0218 09/28/15 2356  HGB 9.1*  --  9.0* 8.7*  --   HCT 30.6*  --  30.1* 30.1*  --   PLT 350  --  362 399  --   HEPARINUNFRC  --   --   --   --  0.30  CREATININE 1.05* 1.07*  --   --   --     Estimated Creatinine Clearance: 65.8 mL/min (by C-G formula based on Cr of 1.07).   Assessment: 70 y/o F on heparin for afib, initial heparin level is on low end of therapeutic range  Goal of Therapy:  Heparin level 0.3-0.7 units/ml Monitor platelets by anticoagulation protocol: Yes   Plan:  -Increase heparin to 1300 units/hr -0900 HL  Maha Fischel 09/29/2015,12:29 AM

## 2015-09-29 NOTE — Progress Notes (Signed)
ANTICOAGULATION CONSULT NOTE - Follow Up Consult  Pharmacy Consult for Heparin  Indication: atrial fibrillation  Allergies  Allergen Reactions  . Gold-Containing Drug Products Other (See Comments)    Blisters   . Tape Dermatitis    Patient Measurements: Height: 5\' 6"  (167.6 cm) Weight: 267 lb (121.11 kg) IBW/kg (Calculated) : 59.3  Vital Signs: Temp: 97.9 F (36.6 C) (06/10 1217) Temp Source: Oral (06/10 1217) BP: 127/57 mmHg (06/10 1217) Pulse Rate: 74 (06/10 1217)  Labs:  Recent Labs  09/27/15 0430  09/27/15 0856 09/28/15 0218 09/28/15 2356 09/29/15 0351 09/29/15 0944 09/29/15 1832  HGB  --   < > 9.0* 8.7*  --  9.2*  --   --   HCT  --   --  30.1* 30.1*  --  31.9*  --   --   PLT  --   --  362 399  --  430*  --   --   HEPARINUNFRC  --   --   --   --  0.30  --  0.17* 0.52  CREATININE 1.07*  --   --   --   --  1.00  --   --   < > = values in this interval not displayed.  Estimated Creatinine Clearance: 70.4 mL/min (by C-G formula based on Cr of 1).   Assessment: 70 y/o F on heparin for afib. S/p cardioversion with return to NSR. Heparin level is now therapeutic at 0.52. No bleeding noted.   Goal of Therapy:  Heparin level 0.3-0.7 units/ml Monitor platelets by anticoagulation protocol: Yes   Plan:  - Continue heparin gtt 1500 units/hr - F/u AM heparin level to confirm dosing - Daily heparin level and CBC  78, PharmD, BCPS Pager # [phone] 09/29/2015 7:31 PM

## 2015-09-29 NOTE — Progress Notes (Signed)
PROGRESS NOTE                                                                                                                                                                                                             Patient Demographics:    [name], is a 70 y.o. female, DOB - April 22, 1945, JKD:[id number]  Admit date - 09/25/2015   Admitting Physician Eduard Clos, MD  Outpatient Primary MD for the patient is [name], MD  LOS - 4  Chief Complaint  Patient presents with  . Respiratory Distress       Brief Narrative   70 y.o. female with a Past Medical History of cerebral aneurysm, HTN, Afib, Anemia, asthma, DM, adrenal mass, GERD, RA, depression, COPD, who presents with Sepsis and COPD exacerbation w/ likely CAP, With significant hypercapnia, requiring BiPAP initially , Significantly improved respiratory status, back on oxygen via nasal cannula , leukocytosis trending down, is likely due to to viral GI illnesses , patient developed A. fib with RVR, Required Cardizem drip, cardiology consulted regarding further management, status post cardioversion on 09/29/2015, currently normal sinus rhythm, planned for cerebral angiogram on Monday to evaluate for aneurysm prior to anticoagulation.  Subjective:    [name] today has, No headache, No chest pain, Denies any dyspnea or abdominal pain.  Assessment  & Plan :    Principal Problem:   Sepsis (HCC) Active Problems:   Diabetes mellitus type II, uncontrolled (HCC)   OSA (obstructive sleep apnea)   Obesity   Iron deficiency anemia   Acute on chronic respiratory failure with hypoxia (HCC)   Hypertension   Acute bronchitis with COPD (HCC)   Acute hyponatremia   Increased anion gap metabolic acidosis   Leukocytosis   Hypokalemia   Atrial fibrillation (HCC)  Acute on chronic hypercapnic/hypoxic respiratory failure. - Patient with baseline COPD, on on home oxygen - Was significant  hypercapnia,Initially requiring BiPAP, significantly improved, no further medial BiPAP of her last 24 hours. - due to COPD exacerbation, as well very likely acute bronchitis .  COPD exacerbation - Continue with Rocephin and azithromycin - treatedwith IV steroids, transitioned to by mouth prednisone - Continue with nebulizer treatment, Spiriva and Dulera - Leukocytosis most likely related to steroids  A. fib with RVR - Patient with new onset A. fib with RVR, cardiology consult greatly  appreciated, initially on Cardizem drip, digoxin and beta blockers, status post cardioversion this morning, back to NSR. - CHA2DS2-VASCScore: Risk Score 4 (without stroke as it was related to aneurysm), a plan for cerebral angiogram bilateral division for on Monday to see if no contraindication for anticoagulation given her history of cerebral aneurysm, meanwhile will start on aspirin.  Diabetes mellitus - Continue to hold metformin, continue with insulin sliding scale and Lantus.  SIRS - Patient presented with significant leukocytosis, elevated lactic acid, reports fever and abdominal pain at home, possibly related to viral GI infection, abdominal pain resolved, benign abdominal exam , leukocytosis trending down, no further diarrhea.  Anemia - Anemia of chronic disease , will being 11.9 on admission, currently 9 ,  most likely delusional factor contributing giving aggressive IV hydration, Hemoccult negative, iron level of 23, ferritin of the 98, normal folate and B-12 WNL .  Hypertension  - Acceptable on atenolol, continue to hold Aldactone/hydrochlorothiazide  Hyponatremia - Continue to hold Dyazide and Lasix, improving with IV fluids  Hypokalemia - Significantly improved, potassium level is 5 today, so we'll discontinue supplement  Hypomagnesemia - Repleted  OSA - Does not use CPAP at home   Code Status : Full  Family Communication  : None at bedside  Disposition Plan  : We'll need SNF  placement hopefully after cereberal angigramdays.  Consults  :  none  Procedures  : none  DVT Prophylaxis  :  Lovenox   Lab Results  Component Value Date   PLT 430* 09/29/2015    Antibiotics  :    Anti-infectives    Start     Dose/Rate Route Frequency Ordered Stop   09/29/15 0000  levofloxacin (LEVAQUIN) 500 MG tablet     500 mg Oral Daily 09/28/15 0956 09/30/15 2359   09/26/15 0800  cefTRIAXone (ROCEPHIN) 1 g in dextrose 5 % 50 mL IVPB     1 g 100 mL/hr over 30 Minutes Intravenous Every 24 hours 09/25/15 1551 10/03/15 0759   09/26/15 0800  azithromycin (ZITHROMAX) 500 mg in dextrose 5 % 250 mL IVPB     500 mg 250 mL/hr over 60 Minutes Intravenous Every 24 hours 09/25/15 1551 10/03/15 0759   09/25/15 0545  cefTRIAXone (ROCEPHIN) 2 g in dextrose 5 % 50 mL IVPB     2 g 100 mL/hr over 30 Minutes Intravenous  Once 09/25/15 0533 09/25/15 0808   09/25/15 0545  azithromycin (ZITHROMAX) 500 mg in dextrose 5 % 250 mL IVPB     500 mg 250 mL/hr over 60 Minutes Intravenous  Once 09/25/15 0533 09/25/15 1029        Objective:   Filed Vitals:   09/29/15 0900 09/29/15 1021 09/29/15 1207 09/29/15 1217  BP: 106/57 104/72 73/56 127/57  Pulse: 60   74  Temp: 97.7 F (36.5 C)   97.9 F (36.6 C)  TempSrc:    Oral  Resp: 15   22  Height:      Weight:      SpO2: 94%   99%    Wt Readings from Last 3 Encounters:  09/25/15 121.11 kg (267 lb)  02/06/15 117.6 kg (259 lb 4.2 oz)  06/03/11 90.719 kg (200 lb)     Intake/Output Summary (Last 24 hours) at 09/29/15 1416 Last data filed at 09/29/15 1100  Gross per 24 hour  Intake 1158.66 ml  Output   1351 ml  Net -192.34 ml     Physical Exam  NAD, comfortable on bed [place].AT,PERRAL Supple  Neck,No JVD,  Symmetrical Chest wall movement, Good air movement bilaterally, CTAB regular,No Gallops,Rubs or new Murmurs, No Parasternal Heave +ve B.Sounds, Abd Soft, No tenderness,  No rebound - guarding or rigidity. No Cyanosis, Clubbing or  edema, No new Rash or bruise     Data Review:    CBC  Recent Labs Lab 09/25/15 0435 09/26/15 0714 09/27/15 0856 09/28/15 0218 09/29/15 0351  WBC 26.3* 24.7* 15.6* 11.8* 14.9*  HGB 11.9* 9.1* 9.0* 8.7* 9.2*  HCT 38.1 30.6* 30.1* 30.1* 31.9*  PLT 414* 350 362 399 430*  MCV 82.6 86.2 85.0 85.8 86.7  MCH 25.8* 25.6* 25.4* 24.8* 25.0*  MCHC 31.2 29.7* 29.9* 28.9* 28.8*  RDW 14.8 15.1 15.0 15.3 15.5  LYMPHSABS 1.8  --   --   --   --   MONOABS 1.6*  --   --   --   --   EOSABS 0.0  --   --   --   --   BASOSABS 0.0  --   --   --   --     Chemistries   Recent Labs Lab 09/25/15 0435 09/25/15 0747 09/25/15 0900 09/26/15 0714 09/27/15 0430 09/29/15 0351  NA 126* 127*  --  135 133* 139  K 3.6 3.0*  --  4.7 5.0 4.2  CL 78* 82*  --  95* 97* 100*  CO2 30 27  --  31 27 32  GLUCOSE 205* 305*  --  112* 146* 150*  BUN 24* 23*  --  13 18 20   CREATININE 1.70* 1.55*  --  1.05* 1.07* 1.00  CALCIUM 9.2 8.0*  --  8.5* 8.5* 8.8*  MG  --   --  2.0  --   --  1.4*   ------------------------------------------------------------------------------------------------------------------ No results for input(s): CHOL, HDL, LDLCALC, TRIG, CHOLHDL, LDLDIRECT in the last 72 hours.  Lab Results  Component Value Date   HGBA1C 7.7* 09/25/2015   ------------------------------------------------------------------------------------------------------------------ No results for input(s): TSH, T4TOTAL, T3FREE, THYROIDAB in the last 72 hours.  Invalid input(s): FREET3 ------------------------------------------------------------------------------------------------------------------ No results for input(s): VITAMINB12, FOLATE, FERRITIN, TIBC, IRON, RETICCTPCT in the last 72 hours.  Coagulation profile  Recent Labs Lab 09/25/15 0900  INR 1.27    No results for input(s): DDIMER in the last 72 hours.  Cardiac Enzymes No results for input(s): CKMB, TROPONINI, MYOGLOBIN in the last 168 hours.  Invalid  input(s): CK ------------------------------------------------------------------------------------------------------------------    Component Value Date/Time   BNP 95.4 09/25/2015 0435    Inpatient Medications  Scheduled Meds: . atenolol  25 mg Oral BID  . azithromycin  500 mg Intravenous Q24H  . cefTRIAXone (ROCEPHIN)  IV  1 g Intravenous Q24H  . diltiazem  180 mg Oral BID  . ferrous sulfate  325 mg Oral Q breakfast  . insulin aspart  0-15 Units Subcutaneous Q4H  . insulin glargine  5 Units Subcutaneous Daily  . levalbuterol  0.63 mg Nebulization TID  . mometasone-formoterol  2 puff Inhalation BID  . pantoprazole  40 mg Oral Daily  . predniSONE  40 mg Oral Q breakfast  . sodium chloride flush  3 mL Intravenous Q12H  . tiotropium  18 mcg Inhalation Daily   Continuous Infusions: . sodium chloride 250 mL (09/29/15 0710)  . heparin 1,500 Units/hr (09/29/15 1214)   PRN Meds:.HYDROmorphone (DILAUDID) injection, ipratropium-albuterol, LORazepam, meperidine (DEMEROL) injection, ondansetron (ZOFRAN) IV, sodium chloride flush, traZODone  Micro Results Recent Results (from the past 240 hour(s))  Culture, blood (Routine X 2) w Reflex  to ID Panel     Status: None (Preliminary result)   Collection Time: 09/25/15  4:58 AM  Result Value Ref Range Status   Specimen Description BLOOD RIGHT FOREARM  Final   Special Requests BOTTLES DRAWN AEROBIC AND ANAEROBIC  Final   Culture NO GROWTH 3 DAYS  Final   Report Status PENDING  Incomplete  Culture, blood (Routine X 2) w Reflex to ID Panel     Status: None (Preliminary result)   Collection Time: 09/25/15  5:58 AM  Result Value Ref Range Status   Specimen Description BLOOD RIGHT HAND  Final   Special Requests BOTTLES DRAWN AEROBIC AND ANAEROBIC  Final   Culture NO GROWTH 3 DAYS  Final   Report Status PENDING  Incomplete  Urine culture     Status: None   Collection Time: 09/25/15  8:38 AM  Result Value Ref Range Status   Specimen  Description URINE, CATHETERIZED  Final   Special Requests NONE  Final   Culture NO GROWTH  Final   Report Status 09/26/2015 FINAL  Final  MRSA PCR Screening     Status: None   Collection Time: 09/26/15  5:58 AM  Result Value Ref Range Status   MRSA by PCR NEGATIVE NEGATIVE Final    Comment:        The GeneXpert MRSA Assay (FDA approved for NASAL specimens only), is one component of a comprehensive MRSA colonization surveillance program. It is not intended to diagnose MRSA infection nor to guide or monitor treatment for MRSA infections.     Radiology Reports Dg Chest 2 View  09/25/2015  CLINICAL DATA:  I initial evaluation for acute shortness of breath. EXAM: CHEST  2 VIEW COMPARISON:  Prior study from 02/20/2015. FINDINGS: Mild accentuation of the cardiac silhouette, likely related to AP technique. Mediastinal silhouette within normal limits. Atheromatous plaque within the intrathoracic aorta. Lungs are normally inflated. Changes related emphysema present. Mild left basilar subsegmental atelectasis/ scar noted. Irregular biapical pleural thickening, stable. No focal infiltrates. Hazy opacity overlying the lower spine on lateral projection favored to be related to overlying soft tissue attenuation. No pulmonary edema or pleural effusion. No pneumothorax. Remotely healed left-sided rib fractures. No acute osseous abnormality. Scoliosis. Osteopenia. IMPRESSION: 1. Emphysema.  No other active cardiopulmonary disease. 2. Atherosclerosis. Electronically Signed   By: Rise Mu M.D.   On: 09/25/2015 05:26   Dg Chest Port 1 View  09/26/2015  CLINICAL DATA:  Short of breath.  COPD. EXAM: PORTABLE CHEST 1 VIEW COMPARISON:  Yesterday FINDINGS: Moderate cardiomegaly. Mild bibasilar atelectasis. Healing left lateral mid rib fractures. No sign of pulmonary edema or consolidation. No pneumothorax. No pleural effusion. IMPRESSION: Cardiomegaly without pulmonary edema. Bibasilar atelectasis.  Electronically Signed   By: Jolaine Click M.D.   On: 09/26/2015 08:24    Time Spent in minutes  25 minutes   Dymon Summerhill M.D on 09/29/2015 at 2:16 PM  Between 7am to 7pm - Pager - [phone]  After 7pm go to [email or web address] - password [facility]  [facility] -  Office  [phone]

## 2015-09-29 NOTE — Transfer of Care (Signed)
Immediate Anesthesia Transfer of Care Note  Patient: [name]  Procedure(s) Performed: Procedure(s): CARDIOVERSION (N/A)  Patient Location: PACU  Anesthesia Type:General  Level of Consciousness: awake  Airway & Oxygen Therapy: Patient Spontanous Breathing and Patient connected to nasal cannula oxygen  Post-op Assessment: Report given to RN and Post -op Vital signs reviewed and stable  Post vital signs: Reviewed and stable  Last Vitals:  Filed Vitals:   09/29/15 0600 09/29/15 0800  BP: 107/59 79/61  Pulse: 184 120  Temp:  36.2 C  Resp: 28 24    Last Pain: There were no vitals filed for this visit.       Complications: No apparent anesthesia complications

## 2015-09-29 NOTE — CV Procedure (Signed)
.  Direct current cardioversion:  Indication symptomatic A. Fibrillation. New onset < 24 hours. Has been on IV heparin.  Procedure: Using 100 mg of IV Propofol for achieving deep sedation, synchronized direct current cardioversion performed. Patient was delivered with 120 Joules of electricity X 1 with success to NSR. Patient tolerated the procedure well. No immediate complication noted.   I have discontinued IV diltiazem and switched to PC dilt. Discontinue digoxin, continue low dose beta blocker with atenolol. Repeat EKG.  Plans on doing Cerebral angiogram by Dr. Kerby Nora on Monday and if he sees no contraindication for anticoagulation, start Xarelto 20 mg with dinner. For now continue IV heparin.

## 2015-09-29 NOTE — Anesthesia Preprocedure Evaluation (Addendum)
Anesthesia Evaluation  Patient identified by MRN, date of birth, ID band Patient awake    Airway Mallampati: I  TM Distance: >3 FB Neck ROM: Full    Dental  (+) Teeth Intact   Pulmonary sleep apnea , COPD, former smoker,    Pulmonary exam normal        Cardiovascular hypertension, Pt. on medications and Pt. on home beta blockers Normal cardiovascular exam+ dysrhythmias Atrial Fibrillation      Neuro/Psych Depression    GI/Hepatic GERD  Controlled and Medicated,  Endo/Other  diabetes, Type 2, Oral Hypoglycemic Agents  Renal/GU      Musculoskeletal   Abdominal   Peds  Hematology   Anesthesia Other Findings   Reproductive/Obstetrics                            Anesthesia Physical Anesthesia Plan  ASA: III  Anesthesia Plan: General   Post-op Pain Management:    Induction: Intravenous  Airway Management Planned: Mask  Additional Equipment:   Intra-op Plan:   Post-operative Plan:   Informed Consent: I have reviewed the patients History and Physical, chart, labs and discussed the procedure including the risks, benefits and alternatives for the proposed anesthesia with the patient or authorized representative who has indicated his/her understanding and acceptance.     Plan Discussed with: CRNA and Surgeon  Anesthesia Plan Comments:         Anesthesia Quick Evaluation

## 2015-09-29 NOTE — Progress Notes (Signed)
Patient in taken to PACU by writer. Consent signed for cardioversion prior to transport.

## 2015-09-29 NOTE — Interval H&P Note (Signed)
History and Physical Interval Note:  09/29/2015 8:46 AM  [name]  has presented today for surgery, with the diagnosis of /  The various methods of treatment have been discussed with the patient and family. After consideration of risks, benefits and other options for treatment, the patient has consented to  Procedure(s): CARDIOVERSION (N/A) as a surgical intervention .  The patient's history has been reviewed, patient examined, no change in status, stable for surgery.  I have reviewed the patient's chart and labs.  Questions were answered to the patient's satisfaction.     [name]

## 2015-09-29 NOTE — Progress Notes (Signed)
ANTICOAGULATION CONSULT NOTE - Follow Up Consult  Pharmacy Consult for Heparin  Indication: atrial fibrillation  Allergies  Allergen Reactions  . Gold-Containing Drug Products Other (See Comments)    Blisters   . Tape Dermatitis    Patient Measurements: Height: 5\' 6"  (167.6 cm) Weight: 267 lb (121.11 kg) IBW/kg (Calculated) : 59.3  Vital Signs: Temp: 97.7 F (36.5 C) (06/10 0900) Temp Source: Oral (06/10 0800) BP: 104/72 mmHg (06/10 1021) Pulse Rate: 60 (06/10 0900)  Labs:  Recent Labs  09/27/15 0430  09/27/15 0856 09/28/15 0218 09/28/15 2356 09/29/15 0351 09/29/15 0944  HGB  --   < > 9.0* 8.7*  --  9.2*  --   HCT  --   --  30.1* 30.1*  --  31.9*  --   PLT  --   --  362 399  --  430*  --   HEPARINUNFRC  --   --   --   --  0.30  --  0.17*  CREATININE 1.07*  --   --   --   --  1.00  --   < > = values in this interval not displayed.  Estimated Creatinine Clearance: 70.4 mL/min (by C-G formula based on Cr of 1).   Assessment: 70 y/o F on heparin for afib. S/p cardioversion today with return to NSR. F/u heparin level is sub-therapeutic. H/H low, Plt wnl. Per RN, the drip has been running without issues. No s/s of bleeding.   Goal of Therapy:  Heparin level 0.3-0.7 units/ml Monitor platelets by anticoagulation protocol: Yes   Plan:  -Heparin 2500 units bolus -Increase heparin to 1500 units/hr -F/u 6 hr HL  78, PharmD., BCPS Clinical Pharmacist Pager [phone]

## 2015-09-29 NOTE — H&P (View-Only) (Signed)
Patient with history of COPD due to prior tobacco use quit in 2006, 40 pack year history, uncontrolled diabetes mellitus, chronic anemia, moderate chronic renal insufficiency, stage III, morbid obesity, history of cerebral aneurysm status post aneurysm coiling in 2009, admitted in January 2009 with COPD exacerbation, was found to have probable atrial fibrillation at that time. She had no recurrence since then.   She was again admitted to [facility] on 09/25/2015 with community-acquired pneumonia and COPD exacerbation. She was being discharged today but developed atrial fibrillation with rapid ventricular response. I was asked to manage atrial fibrillation.  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 Endovascular coiling of the aneurysm performed on May 21, 2007, by Dr. Deveshwar under general anesthesia. 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 Repeat coiling on 11/05/2007 and 01/14/2008, and again in 2010. She has partially recanalized basal apex aneurysm. She has not had any further follow-up from neurosurgical standpoint since 2011.  She denies any chest pain. Denies symptoms of claudication.  CARDIOLOGY CONSULT NOTE  Patient ID: [name] MRN: [id number] DOB/AGE: 06/09/1945 70 y.o.  Admit date: 09/25/2015 Referring Physician  D. Elgergawy, MD Primary Physician:  POLITE,RONALD D, MD Reason for Consultation  A. Fib  HPI: [name]  is a 70 y.o. female  with history of COPD due to prior tobacco use quit in 2006, 40 pack year history, uncontrolled diabetes mellitus, chronic anemia, history of cerebral aneurysm status post aneurysm coiling in 2009, admitted in January 2009 with COPD exacerbation, was found to have probable atrial fibrillation at that time. She had no recurrence since then. She was again admitted to [facility] on 09/25/2015 with  community-acquired pneumonia and COPD exacerbation. She was being discharged today but developed atrial fibrillation with rapid ventricular response. I was asked to manage atrial fibrillation.  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 Endovascular coiling of the aneurysm performed on May 21, 2007, by Dr. Deveshwar under general anesthesia. 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 Repeat coiling on 11/05/2007 and 01/14/2008, and again in 2010. She has partially recanalized basal apex aneurysm. She has not had any further follow-up from neurosurgical standpoint since 2011.  She denies any chest pain. Denies symptoms of claudication. She felt palpitations this morning and states that she is occasional palpitations for many years work is helped by up to rule out. Each episode lasted a few minutes, she has 1-2 episodes a month. This morning temperature for rapid palpitations, she also had some mild chest discomfort in the form of heart throbbing severely.  Past Medical History  Diagnosis Date  . Cerebral aneurysm   . Hypertension   . Atrial fibrillation (HCC)   . Arthritis   . Anemia   . Asthma   . Aneurysm (HCC)     x2  . Complication of anesthesia     Has woken up during surgery before  . Brain aneurysm     have a "giant aneurysm, Dr. Devenshwar has stented and coiled in past x 4  . Shortness of breath   . Bronchitis     history of  . Sleep apnea     does not use cpap  . Diabetes mellitus     metformin  . Adrenal disorder, other     "  adrenal Incidentaloma"  . Urinary tract infection     history of  . GERD (gastroesophageal reflux disease)     omeprazole  . Rheumatoid arthritis(714.0)     has had for approx. 40years, sees Dr. [name]  . Depression     due to loss of spouse 6 years ago  . COPD (chronic obstructive pulmonary disease) (HCC)     Dr.  [name], uses 2.5L of o2 as needed  . Emphysema of lung (HCC)     requested anethesia consult   . Obesity   . Acute respiratory failure (HCC) 02/05/2017     Past Surgical History  Procedure Laterality Date  . Tubal ligation    . Hand surgery      bilateral  . Hand surgery      x 2  . Brain surgery      coiling and stenting  . Mastectomy, partial  02/28/2011    Procedure: MASTECTOMY PARTIAL;  Surgeon: Ernestene Mention, MD;  Location: [facility] OR;  Service: General;  Laterality: Right;  RIGHT PARTIAL MASTECTOMY  WITH NEEDLE LOCALIZATION  . Breast surgery  02/28/11    right partial mastectomy      Family History  Problem Relation Age of Onset  . Heart disease Mother   . Cancer Father     lung  . Cancer Sister     breast     Social History: Social History   Social History  . Marital Status: Widowed    Spouse Name: N/A  . Number of Children: N/A  . Years of Education: N/A   Occupational History  . Not on file.   Social History Main Topics  . Smoking status: Former Smoker -- 1.50 packs/day for 40 years    Types: Cigarettes  . Smokeless tobacco: Never Used     Comment: quit 2006  . Alcohol Use: 0.6 oz/week    1 Shots of liquor per week  . Drug Use: No  . Sexual Activity: Not on file   Other Topics Concern  . Not on file   Social History Narrative     Prescriptions prior to admission  Medication Sig Dispense Refill Last Dose  . acetaminophen (TYLENOL) 500 MG tablet Take 1 tablet (500 mg total) by mouth 3 (three) times daily. 30 tablet 0 PRN  . albuterol (PROAIR HFA) 108 (90 BASE) MCG/ACT inhaler Inhale 2 puffs into the lungs every 6 (six) hours as needed. For shortness of breath   09/24/2015  . aspirin EC 81 MG tablet Take 81 mg by mouth daily.   09/24/2015  . atenolol (TENORMIN) 25 MG tablet Take 25 mg by mouth daily.   09/24/2015 at am  . budesonide-formoterol (SYMBICORT) 80-4.5 MCG/ACT inhaler Inhale 2 puffs into the lungs 2 (two) times daily. Reported on 09/25/2015    09/24/2015 at Unknown time  . Calcium 600-200 MG-UNIT tablet Take 1 tablet by mouth daily.   09/24/2015  . ferrous sulfate 325 (65 FE) MG tablet Take 325 mg by mouth daily with breakfast.   09/24/2015  . furosemide (LASIX) 20 MG tablet Take 20 mg by mouth daily.   09/24/2015  . ibandronate (BONIVA) 150 MG tablet Take 150 mg by mouth every 30 (thirty) days. Take in the morning with a full glass of water, on an empty stomach, and do not take anything else by mouth or lie down for the next 30 min.   09/20/2015  . ipratropium-albuterol (DUONEB) 0.5-2.5 (3) MG/3ML SOLN Take 3 mLs by nebulization 3 (  three) times daily. 360 mL  09/24/2015  . metFORMIN (GLUCOPHAGE) 500 MG tablet Take 500 mg by mouth 2 (two) times daily with a meal.     09/24/2015  . Multiple Vitamins-Minerals (CENTRUM SILVER ADULT 50+ PO) Take 1 tablet by mouth daily.   09/24/2015  . omeprazole (PRILOSEC) 20 MG capsule Take 20 mg by mouth daily.     09/24/2015  . oxybutynin (DITROPAN) 5 MG tablet Take 0.5 tablets (2.5 mg total) by mouth 2 (two) times daily. 20 tablet 0 09/24/2015  . OXYGEN Inhale 2.5 L into the lungs continuous.   09/24/2015  . polyethylene glycol (MIRALAX / GLYCOLAX) packet Take 17 g by mouth daily. 14 each 0 PRN  . senna-docusate (SENOKOT-S) 8.6-50 MG tablet Take 2 tablets by mouth at bedtime. 60 tablet 0 09/21/2015  . tiotropium (SPIRIVA) 18 MCG inhalation capsule Place 18 mcg into inhaler and inhale daily.    09/24/2015  . traZODone (DESYREL) 50 MG tablet Take 50 mg by mouth at bedtime.   09/23/2015  . [DISCONTINUED] LORazepam (ATIVAN) 0.5 MG tablet Take 1 tablet (0.5 mg total) by mouth every 8 (eight) hours as needed for anxiety. 10 tablet 0 2-3 days  . [DISCONTINUED] spironolactone-hydrochlorothiazide (ALDACTAZIDE) 25-25 MG per tablet Take 1 tablet by mouth daily.     09/24/2015     ROS: General: no fevers/chills/night sweats Eyes: no blurry vision, diplopia, or amaurosis ENT: no sore throat or hearing loss Resp: no cough, wheezing, or  hemoptysis CV: no edema or palpitations GI: no abdominal pain, nausea, vomiting, diarrhea, or constipation GU: no dysuria, frequency, or hematuria Skin: no rash Neuro: History of headaches, denies numbness, tingling, or weakness of extremities Musculoskeletal: Severe bilateral knee pain and arthritis, chronic back pain Heme: no bleeding, DVT, or easy bruising Endo: no polydipsia or polyuria   Physical Exam: Blood pressure 116/67, pulse 158, temperature 97.9 F (36.6 C), temperature source Oral, resp. rate 24, height 5\' 6"  (1.676 m), weight 121.11 kg (267 lb), SpO2 95 %. Body mass index is 43.12 kg/(m^2).   General appearance: alert, cooperative, appears older than stated age and morbidly obese Lungs: Prolonged expiration bilateral, mild expiratory wheeze present left base worse than the right. Heart: irregularly irregular rhythm, S1: variable, S2: normal, no rub and no murmur Abdomen: soft, non-tender; bowel sounds normal; no masses,  no organomegaly and obese with pannus (small) present Extremities: edema 2 plus bilateral below knee pitting Pulses: Bilateral carotids normal without bruit, femoral pulses faint due to obesity, popliteal pulse could not be felt due to obesity and pain from arthritis. DP 2+ bilateral, PT could not be felt, significant edema present. Neurologic: Grossly normal  Labs:   Lab Results  Component Value Date   WBC 11.8* 09/28/2015   HGB 8.7* 09/28/2015   HCT 30.1* 09/28/2015   MCV 85.8 09/28/2015   PLT 399 09/28/2015    Recent Labs Lab 09/27/15 0430  NA 133*  K 5.0  CL 97*  CO2 27  BUN 18  CREATININE 1.07*  CALCIUM 8.5*  GLUCOSE 146*    Recent Labs  02/07/15 0638 09/25/15 0435  BNP 132.1* 95.4    HEMOGLOBIN A1C Lab Results  Component Value Date   HGBA1C 7.7* 09/25/2015   MPG 174 09/25/2015    EKG: atrial fibrillation, rate Uncontrolled. Nonspecific T abnormality..  Echo: 02/08/2015: Left ventricle: The cavity size was normal.  Systolic function was normal. The estimated ejection fraction was in the range of 55% to 60%. Wall motion was normal; there  were no regional wall  motion abnormalities. - Left atrium: The atrium was mildly dilated.   Scheduled Meds: . atenolol  25 mg Oral Daily  . azithromycin  500 mg Intravenous Q24H  . cefTRIAXone (ROCEPHIN)  IV  1 g Intravenous Q24H  . enoxaparin (LOVENOX) injection  40 mg Subcutaneous Q24H  . ferrous sulfate  325 mg Oral Q breakfast  . insulin aspart  0-15 Units Subcutaneous Q4H  . insulin glargine  5 Units Subcutaneous Daily  . ipratropium-albuterol  3 mL Nebulization QID  . mometasone-formoterol  2 puff Inhalation BID  . pantoprazole  40 mg Oral Daily  . [START ON 09/29/2015] predniSONE  40 mg Oral Q breakfast  . tiotropium  18 mcg Inhalation Daily   Continuous Infusions: . diltiazem (CARDIZEM) infusion 15 mg/hr (09/28/15 1312)   PRN Meds:.ipratropium-albuterol, LORazepam  ASSESSMENT AND PLAN:  1. New onset atrial fibrillation with rapid ventricle response. Extensive review of chart also appears to have had A. fib sometime in 2009. Patient has chronic palpitations. CHA2DS2-VASCScore: Risk Score  4(without stroke as it was related to aneurysm),  Yearly risk of stroke  4%. 2. Hypertension 3. Hyperlipidemia 4. Diabetes mellitus type 2 uncontrolled without use of insulin 5. Morbid obesity 6. COPD with acute exacerbation, history of 40-pack-year history of smoking, quit 2006 7. History of cerebral aneurysm S/P coiling with residual aneurysm present. 8. Chronic iron deficiency anemia, patient has had extensive evaluation in the past, no obvious source has been found. She has had multiple GI evaluations in the past, follows [facility].  Recommendation: Extremely difficult situation and complex patient with multiple medical comorbidities, with new onset atrial fibrillation, she will need long-term anticoagulation. I had a long discussion with the patient  regarding the risk of bleeding especially in view of chronic anemia which is deficient. Also we have to clarify with neuroradiology, discussed with Dr. [name] regarding intracranial aneurysm and the risk of bleeding. He would prefer to perform cerebral angiography, which we plan to do it on Monday. I will keep her nothing by mouth from Sunday night for possible cerebral angiography.  I will start the patient on IV heparin. Increase beta blocker, continue IV diltiazem and also add digoxin for rate control for now. Eventually I'll plan to take off the digitoxin once rate control is achieved. This was a greater than 90 minute evaluation of all medical records, coordination of care face-to-face encounter and greater than 50% of the time was spent with face-to-face interaction.  [name], MD 09/28/2015, 2:44 PM [facility] Pager: [phone] Office: [phone] If no answer Cell [phone]

## 2015-09-30 LAB — BASIC METABOLIC PANEL
ANION GAP: 9 (ref 5–15)
BUN: 20 mg/dL (ref 6–20)
CALCIUM: 9.1 mg/dL (ref 8.9–10.3)
CHLORIDE: 99 mmol/L — AB (ref 101–111)
CO2: 31 mmol/L (ref 22–32)
CREATININE: 1.06 mg/dL — AB (ref 0.44–1.00)
GFR, EST NON AFRICAN AMERICAN: 52 mL/min — AB (ref >60)
Glucose, Bld: 102 mg/dL — ABNORMAL HIGH (ref 65–99)
Potassium: 4 mmol/L (ref 3.5–5.1)
SODIUM: 139 mmol/L (ref 135–145)

## 2015-09-30 LAB — CBC
HEMATOCRIT: 33.8 % — AB (ref 36.0–46.0)
HEMOGLOBIN: 9.7 g/dL — AB (ref 12.0–15.0)
MCH: 25.5 pg — ABNORMAL LOW (ref 26.0–34.0)
MCHC: 28.7 g/dL — AB (ref 30.0–36.0)
MCV: 88.7 fL (ref 78.0–100.0)
Platelets: 435 10*3/uL — ABNORMAL HIGH (ref 150–400)
RBC: 3.81 MIL/uL — ABNORMAL LOW (ref 3.87–5.11)
RDW: 15.4 % (ref 11.5–15.5)
WBC: 16.4 10*3/uL — AB (ref 4.0–10.5)

## 2015-09-30 LAB — CULTURE, BLOOD (ROUTINE X 2)
CULTURE: NO GROWTH
Culture: NO GROWTH

## 2015-09-30 LAB — GLUCOSE, CAPILLARY
GLUCOSE-CAPILLARY: 106 mg/dL — AB (ref 65–99)
GLUCOSE-CAPILLARY: 144 mg/dL — AB (ref 65–99)
GLUCOSE-CAPILLARY: 185 mg/dL — AB (ref 65–99)
GLUCOSE-CAPILLARY: 208 mg/dL — AB (ref 65–99)
Glucose-Capillary: 163 mg/dL — ABNORMAL HIGH (ref 65–99)
Glucose-Capillary: 255 mg/dL — ABNORMAL HIGH (ref 65–99)

## 2015-09-30 LAB — HEPARIN LEVEL (UNFRACTIONATED): HEPARIN UNFRACTIONATED: 0.57 [IU]/mL (ref 0.30–0.70)

## 2015-09-30 LAB — MAGNESIUM: MAGNESIUM: 1.7 mg/dL (ref 1.7–2.4)

## 2015-09-30 MED ORDER — PREDNISONE 20 MG PO TABS
20.0000 mg | ORAL_TABLET | Freq: Every day | ORAL | Status: DC
  Administered 2015-10-01 – 2015-10-02 (×2): 20 mg via ORAL
  Filled 2015-09-30 (×3): qty 1

## 2015-09-30 MED ORDER — PREDNISONE 10 MG PO TABS
10.0000 mg | ORAL_TABLET | Freq: Once | ORAL | Status: AC
  Administered 2015-09-30: 10 mg via ORAL
  Filled 2015-09-30: qty 1

## 2015-09-30 MED ORDER — FUROSEMIDE 40 MG PO TABS
20.0000 mg | ORAL_TABLET | Freq: Every day | ORAL | Status: DC
  Administered 2015-09-30 – 2015-10-02 (×3): 20 mg via ORAL
  Filled 2015-09-30 (×3): qty 1

## 2015-09-30 MED ORDER — MAGNESIUM SULFATE IN D5W 1-5 GM/100ML-% IV SOLN
1.0000 g | Freq: Once | INTRAVENOUS | Status: AC
  Administered 2015-09-30: 1 g via INTRAVENOUS
  Filled 2015-09-30: qty 100

## 2015-09-30 NOTE — Progress Notes (Signed)
Pt c/o arthritis flare up that is beginning in her back, requested extra dose of prednisone. PRN dose not available. MD made aware of request and new order received for 1 time dose of 10mg  prednisone. Will continue to monitor patient closely.

## 2015-09-30 NOTE — Progress Notes (Signed)
ANTICOAGULATION CONSULT NOTE - Follow Up Consult  Pharmacy Consult for Heparin  Indication: atrial fibrillation  Allergies  Allergen Reactions  . Gold-Containing Drug Products Other (See Comments)    Blisters   . Tape Dermatitis    Patient Measurements: Height: 5\' 6"  (167.6 cm) Weight: 267 lb (121.11 kg) IBW/kg (Calculated) : 59.3  Vital Signs: Temp: 97.7 F (36.5 C) (06/11 0727) Temp Source: Oral (06/11 0727) BP: 124/60 mmHg (06/11 0727) Pulse Rate: 57 (06/11 0727)  Labs:  Recent Labs  09/28/15 0218  09/29/15 0351 09/29/15 0944 09/29/15 1832 09/30/15 0418  HGB 8.7*  --  9.2*  --   --  9.7*  HCT 30.1*  --  31.9*  --   --  33.8*  PLT 399  --  430*  --   --  435*  HEPARINUNFRC  --   < >  --  0.17* 0.52 0.57  CREATININE  --   --  1.00  --   --  1.06*  < > = values in this interval not displayed.  Estimated Creatinine Clearance: 66.4 mL/min (by C-G formula based on Cr of 1.06).   Assessment: 70 y/o F on heparin for afib. S/p cardioversion with return to NSR. Heparin level remains therapeutic at 0.57. No bleeding noted per RN   Goal of Therapy:  Heparin level 0.3-0.7 units/ml Monitor platelets by anticoagulation protocol: Yes   Plan:  - Continue heparin gtt 1500 units/hr - Daily heparin level and CBC - Likely transition to oral AC on Monday   Friday, PharmD., BCPS Clinical Pharmacist Pager [phone]

## 2015-09-30 NOTE — Progress Notes (Signed)
PROGRESS NOTE                                                                                                                                                                                                             Patient Demographics:    [name], is a 70 y.o. female, DOB - [birth year], SLH:[id number]  Admit date - 09/25/2015   Admitting Physician Eduard Clos, MD  Outpatient Primary MD for the patient is [name], MD  LOS - 5  Chief Complaint  Patient presents with  . Respiratory Distress       Brief Narrative   70 y.o. female with a Past Medical History of cerebral aneurysm, HTN, Afib, Anemia, asthma, DM, adrenal mass, GERD, RA, depression, COPD, who presents with Sepsis and COPD exacerbation w/ likely CAP, With significant hypercapnia, requiring BiPAP initially , Significantly improved respiratory status, back on oxygen via nasal cannula , leukocytosis trending down, is likely due to to viral GI illnesses , patient developed A. fib with RVR, Required Cardizem drip, cardiology consulted regarding further management, status post cardioversion on 09/29/2015, currently normal sinus rhythm, planned for cerebral angiogram on Monday to evaluate for aneurysm prior to anticoagulation.   Subjective:    [name] today has, No headache, No chest pain, Denies any dyspnea or abdominal pain.  Assessment  & Plan :    Principal Problem:   Sepsis (HCC) Active Problems:   Diabetes mellitus type II, uncontrolled (HCC)   OSA (obstructive sleep apnea)   Obesity   Iron deficiency anemia   Acute on chronic respiratory failure with hypoxia (HCC)   Hypertension   Acute bronchitis with COPD (HCC)   Acute hyponatremia   Increased anion gap metabolic acidosis   Leukocytosis   Hypokalemia   Atrial fibrillation (HCC)  Acute on chronic hypercapnic/hypoxic respiratory failure. - Patient with baseline COPD, on on home oxygen - Was significant  hypercapnia,Initially requiring BiPAP, significantly improved, no further medial BiPAP of her last 24 hours. - due to COPD exacerbation, as well very likely acute bronchitis .  COPD exacerbation - Treated with Rocephin and azithromycin,  - treatedwith IV steroids, transitioned to by mouth prednisone - Continue with nebulizer treatment, Spiriva and Dulera - Leukocytosis most likely related to steroids  A. fib with RVR - Patient with new onset A. fib with RVR, cardiology  consult greatly appreciated, initially on Cardizem drip, digoxin and beta blockers, status post cardioversion this morning, back to NSR. - Continue with diltiazem - CHA2DS2-VASCScore:  4 (without stroke as it was related to aneurysm), a plan for cerebral angiogram bilateral division for on Monday to see if no contraindication for anticoagulation given her history of cerebral aneurysm, meanwhile continue with  heparin GTT .  Diabetes mellitus - Continue to hold metformin, continue with insulin sliding scale and Lantus.  SIRS - Patient presented with significant leukocytosis, elevated lactic acid, reports fever and abdominal pain at home, possibly related to viral GI infection, abdominal pain resolved, benign abdominal exam , leukocytosis trending down, no further diarrhea.  Anemia - Anemia of chronic disease , will being 11.9 on admission, currently 9 ,  most likely delusional factor contributing giving aggressive IV hydration, Hemoccult negative, iron level of 23, ferritin of the 98, normal folate and B-12 WNL .  Hypertension  - Acceptable on diltiazem  Hyponatremia - Resolved  Hypokalemia - Significantly improved, potassium level is 5 today, so we'll discontinue supplement  Hypomagnesemia - Repleted  OSA - Does not use CPAP at home   Code Status : Full  Family Communication  : None at bedside  Disposition Plan  : We'll need SNF placement hopefully after cereberal angiogram on Monday.  Consults  :   Cardiology  Procedures  : Cardioversion 09/29/2015  DVT Prophylaxis  : heparin GTT  Lab Results  Component Value Date   PLT 435* 09/30/2015    Antibiotics  :    Anti-infectives    Start     Dose/Rate Route Frequency Ordered Stop   09/29/15 0000  levofloxacin (LEVAQUIN) 500 MG tablet     500 mg Oral Daily 09/28/15 0956 09/30/15 2359   09/26/15 0800  cefTRIAXone (ROCEPHIN) 1 g in dextrose 5 % 50 mL IVPB     1 g 100 mL/hr over 30 Minutes Intravenous Every 24 hours 09/25/15 1551 10/03/15 0759   09/26/15 0800  azithromycin (ZITHROMAX) 500 mg in dextrose 5 % 250 mL IVPB     500 mg 250 mL/hr over 60 Minutes Intravenous Every 24 hours 09/25/15 1551 10/03/15 0759   09/25/15 0545  cefTRIAXone (ROCEPHIN) 2 g in dextrose 5 % 50 mL IVPB     2 g 100 mL/hr over 30 Minutes Intravenous  Once 09/25/15 0533 09/25/15 0808   09/25/15 0545  azithromycin (ZITHROMAX) 500 mg in dextrose 5 % 250 mL IVPB     500 mg 250 mL/hr over 60 Minutes Intravenous  Once 09/25/15 0533 09/25/15 1029        Objective:   Filed Vitals:   09/30/15 0600 09/30/15 0727 09/30/15 0759 09/30/15 0800  BP: 129/53 124/60  134/88  Pulse: 70 57  60  Temp:  97.7 F (36.5 C)  97.5 F (36.4 C)  TempSrc:  Oral  Oral  Resp: 23 18  24   Height:      Weight:      SpO2: 92% 98% 92% 91%    Wt Readings from Last 3 Encounters:  09/25/15 121.11 kg (267 lb)  02/06/15 117.6 kg (259 lb 4.2 oz)  06/03/11 90.719 kg (200 lb)     Intake/Output Summary (Last 24 hours) at 09/30/15 1224 Last data filed at 09/30/15 0600  Gross per 24 hour  Intake    660 ml  Output    500 ml  Net    160 ml     Physical Exam  NAD, comfortable on  bed [place].AT,PERRAL Supple Neck,No JVD,  Symmetrical Chest wall movement, Good air movement bilaterally, CTAB regular,No Gallops,Rubs or new Murmurs, No Parasternal Heave +ve B.Sounds, Abd Soft, No tenderness,  No rebound - guarding or rigidity. No Cyanosis, Clubbing or edema, No new Rash or bruise      Data Review:    CBC  Recent Labs Lab 09/25/15 0435 09/26/15 0714 09/27/15 0856 09/28/15 0218 09/29/15 0351 09/30/15 0418  WBC 26.3* 24.7* 15.6* 11.8* 14.9* 16.4*  HGB 11.9* 9.1* 9.0* 8.7* 9.2* 9.7*  HCT 38.1 30.6* 30.1* 30.1* 31.9* 33.8*  PLT 414* 350 362 399 430* 435*  MCV 82.6 86.2 85.0 85.8 86.7 88.7  MCH 25.8* 25.6* 25.4* 24.8* 25.0* 25.5*  MCHC 31.2 29.7* 29.9* 28.9* 28.8* 28.7*  RDW 14.8 15.1 15.0 15.3 15.5 15.4  LYMPHSABS 1.8  --   --   --   --   --   MONOABS 1.6*  --   --   --   --   --   EOSABS 0.0  --   --   --   --   --   BASOSABS 0.0  --   --   --   --   --     Chemistries   Recent Labs Lab 09/25/15 0747 09/25/15 0900 09/26/15 0714 09/27/15 0430 09/29/15 0351 09/30/15 0418  NA 127*  --  135 133* 139 139  K 3.0*  --  4.7 5.0 4.2 4.0  CL 82*  --  95* 97* 100* 99*  CO2 27  --  31 27 32 31  GLUCOSE 305*  --  112* 146* 150* 102*  BUN 23*  --  13 18 20 20   CREATININE 1.55*  --  1.05* 1.07* 1.00 1.06*  CALCIUM 8.0*  --  8.5* 8.5* 8.8* 9.1  MG  --  2.0  --   --  1.4* 1.7   ------------------------------------------------------------------------------------------------------------------ No results for input(s): CHOL, HDL, LDLCALC, TRIG, CHOLHDL, LDLDIRECT in the last 72 hours.  Lab Results  Component Value Date   HGBA1C 7.7* 09/25/2015   ------------------------------------------------------------------------------------------------------------------ No results for input(s): TSH, T4TOTAL, T3FREE, THYROIDAB in the last 72 hours.  Invalid input(s): FREET3 ------------------------------------------------------------------------------------------------------------------ No results for input(s): VITAMINB12, FOLATE, FERRITIN, TIBC, IRON, RETICCTPCT in the last 72 hours.  Coagulation profile  Recent Labs Lab 09/25/15 0900  INR 1.27    No results for input(s): DDIMER in the last 72 hours.  Cardiac Enzymes No results for input(s): CKMB, TROPONINI,  MYOGLOBIN in the last 168 hours.  Invalid input(s): CK ------------------------------------------------------------------------------------------------------------------    Component Value Date/Time   BNP 95.4 09/25/2015 0435    Inpatient Medications  Scheduled Meds: . aspirin EC  81 mg Oral Daily  . atenolol  25 mg Oral BID  . azithromycin  500 mg Intravenous Q24H  . cefTRIAXone (ROCEPHIN)  IV  1 g Intravenous Q24H  . diltiazem  180 mg Oral BID  . ferrous sulfate  325 mg Oral Q breakfast  . furosemide  20 mg Oral Daily  . insulin aspart  0-15 Units Subcutaneous Q4H  . insulin glargine  5 Units Subcutaneous Daily  . levalbuterol  0.63 mg Nebulization TID  . mometasone-formoterol  2 puff Inhalation BID  . pantoprazole  40 mg Oral Daily  . predniSONE  30 mg Oral Q breakfast  . sodium chloride flush  3 mL Intravenous Q12H  . tiotropium  18 mcg Inhalation Daily   Continuous Infusions: . sodium chloride 250 mL (09/29/15 0710)  . heparin  1,500 Units/hr (09/30/15 0300)   PRN Meds:.HYDROmorphone (DILAUDID) injection, ipratropium-albuterol, LORazepam, meperidine (DEMEROL) injection, ondansetron (ZOFRAN) IV, sodium chloride flush, traZODone  Micro Results Recent Results (from the past 240 hour(s))  Culture, blood (Routine X 2) w Reflex to ID Panel     Status: None   Collection Time: 09/25/15  4:58 AM  Result Value Ref Range Status   Specimen Description BLOOD RIGHT FOREARM  Final   Special Requests BOTTLES DRAWN AEROBIC AND ANAEROBIC  Final   Culture NO GROWTH 5 DAYS  Final   Report Status 09/30/2015 FINAL  Final  Culture, blood (Routine X 2) w Reflex to ID Panel     Status: None   Collection Time: 09/25/15  5:58 AM  Result Value Ref Range Status   Specimen Description BLOOD RIGHT HAND  Final   Special Requests BOTTLES DRAWN AEROBIC AND ANAEROBIC  Final   Culture NO GROWTH 5 DAYS  Final   Report Status 09/30/2015 FINAL  Final  Urine culture     Status: None    Collection Time: 09/25/15  8:38 AM  Result Value Ref Range Status   Specimen Description URINE, CATHETERIZED  Final   Special Requests NONE  Final   Culture NO GROWTH  Final   Report Status 09/26/2015 FINAL  Final  MRSA PCR Screening     Status: None   Collection Time: 09/26/15  5:58 AM  Result Value Ref Range Status   MRSA by PCR NEGATIVE NEGATIVE Final    Comment:        The GeneXpert MRSA Assay (FDA approved for NASAL specimens only), is one component of a comprehensive MRSA colonization surveillance program. It is not intended to diagnose MRSA infection nor to guide or monitor treatment for MRSA infections.     Radiology Reports Dg Chest 2 View  09/25/2015  CLINICAL DATA:  I initial evaluation for acute shortness of breath. EXAM: CHEST  2 VIEW COMPARISON:  Prior study from 02/20/2015. FINDINGS: Mild accentuation of the cardiac silhouette, likely related to AP technique. Mediastinal silhouette within normal limits. Atheromatous plaque within the intrathoracic aorta. Lungs are normally inflated. Changes related emphysema present. Mild left basilar subsegmental atelectasis/ scar noted. Irregular biapical pleural thickening, stable. No focal infiltrates. Hazy opacity overlying the lower spine on lateral projection favored to be related to overlying soft tissue attenuation. No pulmonary edema or pleural effusion. No pneumothorax. Remotely healed left-sided rib fractures. No acute osseous abnormality. Scoliosis. Osteopenia. IMPRESSION: 1. Emphysema.  No other active cardiopulmonary disease. 2. Atherosclerosis. Electronically Signed   By: Rise Mu M.D.   On: 09/25/2015 05:26   Dg Chest Port 1 View  09/26/2015  CLINICAL DATA:  Short of breath.  COPD. EXAM: PORTABLE CHEST 1 VIEW COMPARISON:  Yesterday FINDINGS: Moderate cardiomegaly. Mild bibasilar atelectasis. Healing left lateral mid rib fractures. No sign of pulmonary edema or consolidation. No pneumothorax. No pleural effusion.  IMPRESSION: Cardiomegaly without pulmonary edema. Bibasilar atelectasis. Electronically Signed   By: Jolaine Click M.D.   On: 09/26/2015 08:24    Time Spent in minutes  25 minutes   Damoni Erker M.D on 09/30/2015 at 12:24 PM  Between 7am to 7pm - Pager - [phone]  After 7pm go to [email or web address] - password [email or web address]  [facility] -  Office  [phone]

## 2015-10-01 ENCOUNTER — Inpatient Hospital Stay (HOSPITAL_COMMUNITY): Payer: Medicare Other

## 2015-10-01 ENCOUNTER — Encounter (HOSPITAL_COMMUNITY): Payer: Self-pay | Admitting: Cardiology

## 2015-10-01 DIAGNOSIS — A419 Sepsis, unspecified organism: Principal | ICD-10-CM

## 2015-10-01 HISTORY — PX: IR GENERIC HISTORICAL: IMG1180011

## 2015-10-01 LAB — PROTIME-INR
INR: 1.15 (ref 0.00–1.49)
Prothrombin Time: 14.9 seconds (ref 11.6–15.2)

## 2015-10-01 LAB — CBC
HCT: 33.4 % — ABNORMAL LOW (ref 36.0–46.0)
Hemoglobin: 9.7 g/dL — ABNORMAL LOW (ref 12.0–15.0)
MCH: 25.5 pg — ABNORMAL LOW (ref 26.0–34.0)
MCHC: 29 g/dL — AB (ref 30.0–36.0)
MCV: 87.9 fL (ref 78.0–100.0)
PLATELETS: 412 10*3/uL — AB (ref 150–400)
RBC: 3.8 MIL/uL — ABNORMAL LOW (ref 3.87–5.11)
RDW: 15.4 % (ref 11.5–15.5)
WBC: 15.1 10*3/uL — AB (ref 4.0–10.5)

## 2015-10-01 LAB — GLUCOSE, CAPILLARY
GLUCOSE-CAPILLARY: 163 mg/dL — AB (ref 65–99)
GLUCOSE-CAPILLARY: 166 mg/dL — AB (ref 65–99)
GLUCOSE-CAPILLARY: 371 mg/dL — AB (ref 65–99)
Glucose-Capillary: 102 mg/dL — ABNORMAL HIGH (ref 65–99)
Glucose-Capillary: 86 mg/dL (ref 65–99)
Glucose-Capillary: 200 mg/dL — ABNORMAL HIGH (ref 65–99)

## 2015-10-01 LAB — TSH: TSH: 0.613 u[IU]/mL (ref 0.350–4.500)

## 2015-10-01 LAB — HEPARIN LEVEL (UNFRACTIONATED): Heparin Unfractionated: 0.39 IU/mL (ref 0.30–0.70)

## 2015-10-01 MED ORDER — FENTANYL CITRATE (PF) 100 MCG/2ML IJ SOLN
INTRAMUSCULAR | Status: AC | PRN
  Administered 2015-10-01 (×2): 25 ug via INTRAVENOUS

## 2015-10-01 MED ORDER — DILTIAZEM HCL ER COATED BEADS 120 MG PO CP24
120.0000 mg | ORAL_CAPSULE | Freq: Two times a day (BID) | ORAL | Status: DC
  Administered 2015-10-01 – 2015-10-02 (×2): 120 mg via ORAL
  Filled 2015-10-01 (×2): qty 1

## 2015-10-01 MED ORDER — FENTANYL CITRATE (PF) 100 MCG/2ML IJ SOLN
INTRAMUSCULAR | Status: AC
  Filled 2015-10-01: qty 2

## 2015-10-01 MED ORDER — IOPAMIDOL (ISOVUE-300) INJECTION 61%
INTRAVENOUS | Status: AC
  Administered 2015-10-01: 21 mL
  Filled 2015-10-01: qty 50

## 2015-10-01 MED ORDER — IOPAMIDOL (ISOVUE-300) INJECTION 61%
INTRAVENOUS | Status: AC
  Administered 2015-10-01: 70 mL
  Filled 2015-10-01: qty 150

## 2015-10-01 MED ORDER — ADULT MULTIVITAMIN W/MINERALS CH
1.0000 | ORAL_TABLET | Freq: Every day | ORAL | Status: DC
  Administered 2015-10-01 – 2015-10-02 (×2): 1 via ORAL
  Filled 2015-10-01 (×2): qty 1

## 2015-10-01 MED ORDER — HEPARIN SODIUM (PORCINE) 1000 UNIT/ML IJ SOLN
INTRAMUSCULAR | Status: AC
  Filled 2015-10-01: qty 1

## 2015-10-01 MED ORDER — LIDOCAINE HCL 1 % IJ SOLN
INTRAMUSCULAR | Status: AC
  Administered 2015-10-01: 10 mL
  Filled 2015-10-01: qty 20

## 2015-10-01 MED ORDER — ATENOLOL 25 MG PO TABS
25.0000 mg | ORAL_TABLET | Freq: Every day | ORAL | Status: DC
Start: 2015-10-01 — End: 2015-10-02
  Administered 2015-10-02: 25 mg via ORAL
  Filled 2015-10-01 (×2): qty 1

## 2015-10-01 MED ORDER — MIDAZOLAM HCL 2 MG/2ML IJ SOLN
INTRAMUSCULAR | Status: AC | PRN
  Administered 2015-10-01: 1 mg via INTRAVENOUS
  Administered 2015-10-01: 0.5 mg via INTRAVENOUS

## 2015-10-01 MED ORDER — IOPAMIDOL (ISOVUE-300) INJECTION 61%
INTRAVENOUS | Status: AC
  Administered 2015-10-01: 15 mL
  Filled 2015-10-01: qty 100

## 2015-10-01 MED ORDER — MIDAZOLAM HCL 2 MG/2ML IJ SOLN
INTRAMUSCULAR | Status: AC
  Filled 2015-10-01: qty 2

## 2015-10-01 NOTE — Progress Notes (Signed)
Physical Therapy Treatment Patient Details Name: [name] MRN: [id number] DOB: Sep 25, 1945 Today's Date: 10/01/2015    History of Present Illness This 70 y.o. female admitted to with SOB, weakness, fever, and generalized pain.  Dx: Sepsis, acute on chronic respiratory failrue with hypoxia/acute bronchitis.  PMH includes:  COPD, HTN, Depression, A-Fib, arthritis, ashtma, brain aneurysm, sleep apnea, RA, emphysema     PT Comments     Pt continues to be weak, deconditioned, with poor endurance and activity tolerance.  O2 sats remained stable on 3 L O2 [place] during gait today despite 3/4 DOE.  Pt continues to be appropriate for SNF level rehab at discharge.  PT to follow acutely until d/c confirmed.     Follow Up Recommendations  SNF;Supervision for mobility/OOB     Equipment Recommendations  None recommended by PT    Recommendations for Other Services   NA     Precautions / Restrictions Precautions Precautions: Fall Precaution Comments: monitor 02    Mobility  Bed Mobility Overal bed mobility: Modified Independent             General bed mobility comments: Pt able to get EOB using bed rails for leverage  Transfers Overall transfer level: Needs assistance Equipment used: Rolling walker (2 wheeled) Transfers: Sit to/from Stand Sit to Stand: Supervision         General transfer comment: Supervision for safety. Use of hands for control during transitions  Ambulation/Gait Ambulation/Gait assistance: Min assist Ambulation Distance (Feet): 75 Feet Assistive device: Rolling walker (2 wheeled) Gait Pattern/deviations: Step-through pattern;Staggering left;Staggering right Gait velocity: decreased Gait velocity interpretation: Below normal speed for age/gender General Gait Details: Pt with staggering gait pattern even with RW use.  Assist needed to keep balance at times.  Verbal cues for safe use of RW as pt picks it up, loses her balance when moving around obstacles.  Verbal cues for pursed lip breathing as DOE increased to 3/4 by the end of gait.  Pt was unabel to make it all the way back to her room and sat down in a chair in the hallway.        Balance Overall balance assessment: Needs assistance Sitting-balance support: Feet supported;No upper extremity supported Sitting balance-Leahy Scale: Good     Standing balance support: No upper extremity supported;Bilateral upper extremity supported Standing balance-Leahy Scale: Fair                      Cognition Arousal/Alertness: Awake/alert Behavior During Therapy: WFL for tasks assessed/performed Overall Cognitive Status: Within Functional Limits for tasks assessed                      Exercises General Exercises - Lower Extremity Ankle Circles/Pumps: Other (comment) (encouraged for edema control as well as elevated legs)        Pertinent Vitals/Pain Pain Assessment: No/denies pain           PT Goals (current goals can now be found in the care plan section) Acute Rehab PT Goals Patient Stated Goal: to get stronger  Progress towards PT goals: Progressing toward goals    Frequency  Min 2X/week (per departmental protocols, would benefit from more times)    PT Plan Frequency needs to be updated       End of Session Equipment Utilized During Treatment: Oxygen;Gait belt Activity Tolerance: Patient limited by fatigue Patient left: in chair;with call bell/phone within reach     Time: 9629-5284 PT Time Calculation (  min) (ACUTE ONLY): 28 min  Charges:  $Gait Training: 8-22 mins $Therapeutic Activity: 8-22 mins                      Haylynn Pha B. Aleesia Henney, PT, DPT [phone]   10/01/2015, 3:34 PM

## 2015-10-01 NOTE — Progress Notes (Signed)
PROGRESS NOTE                                                                                                                                                                                                             Patient Demographics:    [name], is a 70 y.o. female, DOB - June 09, 1945, QMG:[id number]  Admit date - 09/25/2015   Admitting Physician Eduard Clos, MD  Outpatient Primary MD for the patient is [name], MD  LOS - 6  Chief Complaint  Patient presents with  . Respiratory Distress       Brief Narrative   70 y.o. female with a Past Medical History of cerebral aneurysm, HTN, Afib, Anemia, asthma, DM, adrenal mass, GERD, RA, depression, COPD, who presents with Sepsis and COPD exacerbation w/ likely CAP, With significant hypercapnia, requiring BiPAP initially , Significantly improved respiratory status, back on oxygen via nasal cannula , leukocytosis trending down, is likely due to to viral GI illnesses , patient developed A. fib with RVR, Required Cardizem drip, cardiology consulted regarding further management, status post cardioversion on 09/29/2015, currently normal sinus rhythm, planned for cerebral angiogram today to evaluate for aneurysm prior to anticoagulation.   Subjective:    [name] today has, No headache, No chest pain, Denies any dyspnea or abdominal pain.  Assessment  & Plan :    Principal Problem:   Sepsis (HCC) Active Problems:   Diabetes mellitus type II, uncontrolled (HCC)   OSA (obstructive sleep apnea)   Obesity   Iron deficiency anemia   Acute on chronic respiratory failure with hypoxia (HCC)   Hypertension   Acute bronchitis with COPD (HCC)   Acute hyponatremia   Increased anion gap metabolic acidosis   Leukocytosis   Hypokalemia   Atrial fibrillation (HCC)  Acute on chronic hypercapnic/hypoxic respiratory failure. - Patient with baseline COPD, on on home oxygen  - Was significant  hypercapnia,Initially requiring BiPAP. - due to COPD exacerbation, as well very likely acute bronchitis .  COPD exacerbation - Treated with Rocephin and azithromycin,  - treatedwith IV steroids, transitioned to by mouth prednisone - Continue with nebulizer treatment, Spiriva and Dulera - Leukocytosis most likely related to steroids  A. fib with RVR - Patient with new onset A. fib with RVR, cardiology consult greatly appreciated, initially on Cardizem drip, digoxin and beta blockers,  status post cardioversion. Remains in NSR. - Heartrate on the lower side, so will decrease Cardizem CD from 180 twice a day to 120 twice a day, and will decrease atenolol from twice a day to once daily - CHA2DS2-VASCScore:  4 (without stroke as it was related to aneurysm), a plan for cerebral angiogram bilateral division for on Monday to see if no contraindication for anticoagulation given her history of cerebral aneurysm, meanwhile continue with  heparin GTT . - follow on TSH.  Diabetes mellitus - Continue to hold metformin, continue with insulin sliding scale and Lantus.  SIRS - Patient presented with significant leukocytosis, elevated lactic acid, reports fever and abdominal pain at home, possibly related to viral GI infection, abdominal pain resolved, benign abdominal exam , leukocytosis trending down, no further diarrhea.  Anemia - Anemia of chronic disease , hemoglobin 11.9 on admission, currently 9 ,  most likely delusional factor contributing giving aggressive IV hydration, Hemoccult negative, iron level of 23, ferritin of the 398, normal folate and B-12 WNL .  Hypertension  - Acceptable on diltiazem and atenolol  Hyponatremia - Resolved  Hypokalemia - Significantly improved, potassium level is 5 today, so we'll discontinue supplement  Hypomagnesemia - Repleted  OSA - Does not use CPAP at home   Code Status : Full  Family Communication  : None at bedside  Disposition Plan  : For SNIF  placement in a.m.  Consults  :  Cardiology, interventional neuroradiology  Procedures  : Cardioversion 09/29/2015  DVT Prophylaxis  : heparin GTT  Lab Results  Component Value Date   PLT 412* 10/01/2015    Antibiotics  :    Anti-infectives    Start     Dose/Rate Route Frequency Ordered Stop   09/29/15 0000  levofloxacin (LEVAQUIN) 500 MG tablet     500 mg Oral Daily 09/28/15 0956 09/30/15 2359   09/26/15 0800  cefTRIAXone (ROCEPHIN) 1 g in dextrose 5 % 50 mL IVPB  Status:  Discontinued     1 g 100 mL/hr over 30 Minutes Intravenous Every 24 hours 09/25/15 1551 09/30/15 1231   09/26/15 0800  azithromycin (ZITHROMAX) 500 mg in dextrose 5 % 250 mL IVPB  Status:  Discontinued     500 mg 250 mL/hr over 60 Minutes Intravenous Every 24 hours 09/25/15 1551 09/30/15 1231   09/25/15 0545  cefTRIAXone (ROCEPHIN) 2 g in dextrose 5 % 50 mL IVPB     2 g 100 mL/hr over 30 Minutes Intravenous  Once 09/25/15 0533 09/25/15 0808   09/25/15 0545  azithromycin (ZITHROMAX) 500 mg in dextrose 5 % 250 mL IVPB     500 mg 250 mL/hr over 60 Minutes Intravenous  Once 09/25/15 0533 09/25/15 1029        Objective:   Filed Vitals:   10/01/15 0711 10/01/15 0821 10/01/15 1002 10/01/15 1021  BP: 111/95  134/56 136/60  Pulse: 68 52 54 49  Temp: 97.7 F (36.5 C)     TempSrc: Oral     Resp: 22 14 16 10   Height:      Weight:      SpO2: 94% 97% 100% 97%    Wt Readings from Last 3 Encounters:  09/25/15 121.11 kg (267 lb)  02/06/15 117.6 kg (259 lb 4.2 oz)  06/03/11 90.719 kg (200 lb)     Intake/Output Summary (Last 24 hours) at 10/01/15 1021 Last data filed at 10/01/15 0720  Gross per 24 hour  Intake 952.25 ml  Output  700 ml  Net 252.25 ml     Physical Exam  NAD, comfortable on bed [place].AT,PERRAL Supple Neck,No JVD,  Symmetrical Chest wall movement, Good air movement bilaterally, CTAB regular,No Gallops,Rubs or new Murmurs, No Parasternal Heave +ve B.Sounds, Abd Soft, No tenderness,  No  rebound - guarding or rigidity. No Cyanosis, Clubbing or edema, No new Rash or bruise     Data Review:    CBC  Recent Labs Lab 09/25/15 0435  09/27/15 0856 09/28/15 0218 09/29/15 0351 09/30/15 0418 10/01/15 0227  WBC 26.3*  < > 15.6* 11.8* 14.9* 16.4* 15.1*  HGB 11.9*  < > 9.0* 8.7* 9.2* 9.7* 9.7*  HCT 38.1  < > 30.1* 30.1* 31.9* 33.8* 33.4*  PLT 414*  < > 362 399 430* 435* 412*  MCV 82.6  < > 85.0 85.8 86.7 88.7 87.9  MCH 25.8*  < > 25.4* 24.8* 25.0* 25.5* 25.5*  MCHC 31.2  < > 29.9* 28.9* 28.8* 28.7* 29.0*  RDW 14.8  < > 15.0 15.3 15.5 15.4 15.4  LYMPHSABS 1.8  --   --   --   --   --   --   MONOABS 1.6*  --   --   --   --   --   --   EOSABS 0.0  --   --   --   --   --   --   BASOSABS 0.0  --   --   --   --   --   --   < > = values in this interval not displayed.  Chemistries   Recent Labs Lab 09/25/15 0747 09/25/15 0900 09/26/15 0714 09/27/15 0430 09/29/15 0351 09/30/15 0418  NA 127*  --  135 133* 139 139  K 3.0*  --  4.7 5.0 4.2 4.0  CL 82*  --  95* 97* 100* 99*  CO2 27  --  31 27 32 31  GLUCOSE 305*  --  112* 146* 150* 102*  BUN 23*  --  13 18 20 20   CREATININE 1.55*  --  1.05* 1.07* 1.00 1.06*  CALCIUM 8.0*  --  8.5* 8.5* 8.8* 9.1  MG  --  2.0  --   --  1.4* 1.7   ------------------------------------------------------------------------------------------------------------------ No results for input(s): CHOL, HDL, LDLCALC, TRIG, CHOLHDL, LDLDIRECT in the last 72 hours.  Lab Results  Component Value Date   HGBA1C 7.7* 09/25/2015   ------------------------------------------------------------------------------------------------------------------ No results for input(s): TSH, T4TOTAL, T3FREE, THYROIDAB in the last 72 hours.  Invalid input(s): FREET3 ------------------------------------------------------------------------------------------------------------------ No results for input(s): VITAMINB12, FOLATE, FERRITIN, TIBC, IRON, RETICCTPCT in the last 72  hours.  Coagulation profile  Recent Labs Lab 09/25/15 0900 10/01/15 0827  INR 1.27 1.15    No results for input(s): DDIMER in the last 72 hours.  Cardiac Enzymes No results for input(s): CKMB, TROPONINI, MYOGLOBIN in the last 168 hours.  Invalid input(s): CK ------------------------------------------------------------------------------------------------------------------    Component Value Date/Time   BNP 95.4 09/25/2015 0435    Inpatient Medications  Scheduled Meds: . atenolol  25 mg Oral Daily  . diltiazem  120 mg Oral BID  . fentaNYL      . ferrous sulfate  325 mg Oral Q breakfast  . furosemide  20 mg Oral Daily  . insulin aspart  0-15 Units Subcutaneous Q4H  . insulin glargine  5 Units Subcutaneous Daily  . iopamidol      . levalbuterol  0.63 mg Nebulization TID  . lidocaine      .  midazolam      . mometasone-formoterol  2 puff Inhalation BID  . pantoprazole  40 mg Oral Daily  . predniSONE  20 mg Oral Q breakfast  . sodium chloride flush  3 mL Intravenous Q12H  . tiotropium  18 mcg Inhalation Daily   Continuous Infusions: . sodium chloride 250 mL (09/29/15 0710)  . heparin 1,500 Units/hr (09/30/15 1921)   PRN Meds:.fentaNYL, ipratropium-albuterol, LORazepam, meperidine (DEMEROL) injection, midazolam, ondansetron (ZOFRAN) IV, sodium chloride flush, traZODone  Micro Results Recent Results (from the past 240 hour(s))  Culture, blood (Routine X 2) w Reflex to ID Panel     Status: None   Collection Time: 09/25/15  4:58 AM  Result Value Ref Range Status   Specimen Description BLOOD RIGHT FOREARM  Final   Special Requests BOTTLES DRAWN AEROBIC AND ANAEROBIC  Final   Culture NO GROWTH 5 DAYS  Final   Report Status 09/30/2015 FINAL  Final  Culture, blood (Routine X 2) w Reflex to ID Panel     Status: None   Collection Time: 09/25/15  5:58 AM  Result Value Ref Range Status   Specimen Description BLOOD RIGHT HAND  Final   Special Requests BOTTLES DRAWN  AEROBIC AND ANAEROBIC  Final   Culture NO GROWTH 5 DAYS  Final   Report Status 09/30/2015 FINAL  Final  Urine culture     Status: None   Collection Time: 09/25/15  8:38 AM  Result Value Ref Range Status   Specimen Description URINE, CATHETERIZED  Final   Special Requests NONE  Final   Culture NO GROWTH  Final   Report Status 09/26/2015 FINAL  Final  MRSA PCR Screening     Status: None   Collection Time: 09/26/15  5:58 AM  Result Value Ref Range Status   MRSA by PCR NEGATIVE NEGATIVE Final    Comment:        The GeneXpert MRSA Assay (FDA approved for NASAL specimens only), is one component of a comprehensive MRSA colonization surveillance program. It is not intended to diagnose MRSA infection nor to guide or monitor treatment for MRSA infections.     Radiology Reports Dg Chest 2 View  09/25/2015  CLINICAL DATA:  I initial evaluation for acute shortness of breath. EXAM: CHEST  2 VIEW COMPARISON:  Prior study from 02/20/2015. FINDINGS: Mild accentuation of the cardiac silhouette, likely related to AP technique. Mediastinal silhouette within normal limits. Atheromatous plaque within the intrathoracic aorta. Lungs are normally inflated. Changes related emphysema present. Mild left basilar subsegmental atelectasis/ scar noted. Irregular biapical pleural thickening, stable. No focal infiltrates. Hazy opacity overlying the lower spine on lateral projection favored to be related to overlying soft tissue attenuation. No pulmonary edema or pleural effusion. No pneumothorax. Remotely healed left-sided rib fractures. No acute osseous abnormality. Scoliosis. Osteopenia. IMPRESSION: 1. Emphysema.  No other active cardiopulmonary disease. 2. Atherosclerosis. Electronically Signed   By: Rise Mu M.D.   On: 09/25/2015 05:26   Dg Chest Port 1 View  09/26/2015  CLINICAL DATA:  Short of breath.  COPD. EXAM: PORTABLE CHEST 1 VIEW COMPARISON:  Yesterday FINDINGS: Moderate cardiomegaly. Mild  bibasilar atelectasis. Healing left lateral mid rib fractures. No sign of pulmonary edema or consolidation. No pneumothorax. No pleural effusion. IMPRESSION: Cardiomegaly without pulmonary edema. Bibasilar atelectasis. Electronically Signed   By: Jolaine Click M.D.   On: 09/26/2015 08:24    Time Spent in minutes  25 minutes   Gaberial Cada M.D on 10/01/2015 at 10:21 AM  Between 7am to 7pm - Pager - [phone]  After 7pm go to [email or web address] - password [facility]  [facility] -  Office  [phone]

## 2015-10-01 NOTE — Progress Notes (Signed)
Subjective:  Doing well. Baseline dyspnea only.  Objective:  Vital Signs in the last 24 hours: Temp:  [97.7 F (36.5 C)-97.9 F (36.6 C)] 97.7 F (36.5 C) (06/12 0711) Pulse Rate:  [44-68] 52 (06/12 0821) Resp:  [11-25] 14 (06/12 0821) BP: (82-131)/(45-95) 111/95 mmHg (06/12 0711) SpO2:  [85 %-99 %] 97 % (06/12 0821)  Intake/Output from previous day: 06/11 0701 - 06/12 0700 In: 1192.3 [P.O.:840; I.V.:352.3] Out: -   Physical Exam: General appearance: alert, cooperative, appears older than stated age and morbidly obese Lungs: Prolonged expiration bilateral, mild expiratory wheeze present left base worse than the right. Heart: Regular  rhythm, S1, S2: normal, no rub and no murmur Abdomen: soft, non-tender; bowel sounds normal; no masses, no organomegaly and obese with pannus (small) present Extremities: edema 2 plus bilateral below knee pitting Pulses: Bilateral carotids normal without bruit, femoral pulses faint due to obesity, popliteal pulse could not be felt due to obesity and pain from arthritis. DP 2+ bilateral, PT could not be felt, significant edema present. Neurologic: Grossly normal Lab Results: BMP  Recent Labs  09/27/15 0430 09/29/15 0351 09/30/15 0418  NA 133* 139 139  K 5.0 4.2 4.0  CL 97* 100* 99*  CO2 27 32 31  GLUCOSE 146* 150* 102*  BUN 18 20 20   CREATININE 1.07* 1.00 1.06*  CALCIUM 8.5* 8.8* 9.1  GFRNONAA 52* 56* 52*  GFRAA 60* >60 >60    CBC  Recent Labs Lab 09/25/15 0435  10/01/15 0227  WBC 26.3*  < > 15.1*  RBC 4.61  < > 3.80*  HGB 11.9*  < > 9.7*  HCT 38.1  < > 33.4*  PLT 414*  < > 412*  MCV 82.6  < > 87.9  MCH 25.8*  < > 25.5*  MCHC 31.2  < > 29.0*  RDW 14.8  < > 15.4  LYMPHSABS 1.8  --   --   MONOABS 1.6*  --   --   EOSABS 0.0  --   --   BASOSABS 0.0  --   --   < > = values in this interval not displayed.  HEMOGLOBIN A1C Lab Results  Component Value Date   HGBA1C 7.7* 09/25/2015   MPG 174 09/25/2015  Cardiac  Studies: EKG: 09/28/2015: atrial fibrillation, rate Uncontrolled. Nonspecific T abnormality. EKG 09/30/2015: Normal sinus rhythm.  Echo: 02/08/2015: Left ventricle: The cavity size was normal. Systolic function was normal. The estimated ejection fraction was in the range of 55% to 60%. Wall motion was normal; there were no regional wall  motion abnormalities. - Left atrium: The atrium was mildly dilated.  Cardioversion 09/29/2015: Atrial fibrillation to normal sinus rhythm with 120 J.  Scheduled Meds: . atenolol  25 mg Oral Daily  . diltiazem  120 mg Oral BID  . fentaNYL      . ferrous sulfate  325 mg Oral Q breakfast  . furosemide  20 mg Oral Daily  . insulin aspart  0-15 Units Subcutaneous Q4H  . insulin glargine  5 Units Subcutaneous Daily  . iopamidol      . levalbuterol  0.63 mg Nebulization TID  . lidocaine      . midazolam      . mometasone-formoterol  2 puff Inhalation BID  . pantoprazole  40 mg Oral Daily  . predniSONE  20 mg Oral Q breakfast  . sodium chloride flush  3 mL Intravenous Q12H  . tiotropium  18 mcg Inhalation Daily   Continuous Infusions: . sodium  chloride 250 mL (09/29/15 0710)  . heparin 1,500 Units/hr (09/30/15 1921)   PRN Meds:.ipratropium-albuterol, LORazepam, meperidine (DEMEROL) injection, ondansetron (ZOFRAN) IV, sodium chloride flush, traZODone   Assessment/Plan:  1. New onset atrial fibrillation with rapid ventricle response. Extensive review of chart also appears to have had A. fib sometime in 2009. Patient has chronic palpitations. Successful cardioversion 09/29/2015. Maintain sinus rhythm. CHA2DS2-VASCScore: Risk Score 4(without stroke as it was related to aneurysm), Yearly risk of stroke 4%. 2. Hypertension 3. Hyperlipidemia 4. Diabetes mellitus type 2 uncontrolled without use of insulin 5. Morbid obesity 6. COPD with acute exacerbation, history of 40-pack-year history of smoking, quit 2006 7. History of cerebral aneurysm S/P coiling  with residual aneurysm present. 8. Chronic iron deficiency anemia, patient has had extensive evaluation in the past, no obvious source has been found. She has had multiple GI evaluations in the past, follows [facility].  Recommendation: Patient needs to be on anticoagulation due to high risk for cardioembolic phenomena. Patient is being evaluated by neuroradiology for evaluation of her prior history of aneurysms to see there is any contraindication. If there is no contraindication, patient can be started on Xarelto 20 mg by mouth after dinner daily with close monitoring of her hemoglobin. I be happy to see her back in the office in 2 weeks. Continue present medications. Consider addition of multivitamin and also iron supplement. Check TSH.   [name], M.D. 10/01/2015, 9:37 AM [facility] Pager: [phone] Office: [phone] If no answer: [phone]

## 2015-10-01 NOTE — Procedures (Signed)
S/P 4 vessel cerebral arteriogram. RT CFA approach. Findings. 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

## 2015-10-01 NOTE — Sedation Documentation (Signed)
Patient is resting comfortably. 

## 2015-10-01 NOTE — Progress Notes (Signed)
ANTICOAGULATION CONSULT NOTE - Follow Up Consult  Pharmacy Consult for Heparin  Indication: atrial fibrillation  Allergies  Allergen Reactions  . Gold-Containing Drug Products Other (See Comments)    Blisters   . Tape Dermatitis    Patient Measurements: Height: 5\' 6"  (167.6 cm) Weight: 267 lb (121.11 kg) IBW/kg (Calculated) : 59.3  Vital Signs: Temp: 97.7 F (36.5 C) (06/12 0711) Temp Source: Oral (06/12 0711) BP: 111/95 mmHg (06/12 0711) Pulse Rate: 52 (06/12 0821)  Labs:  Recent Labs  09/29/15 0351  09/29/15 1832 09/30/15 0418 10/01/15 0227 10/01/15 0827  HGB 9.2*  --   --  9.7* 9.7*  --   HCT 31.9*  --   --  33.8* 33.4*  --   PLT 430*  --   --  435* 412*  --   LABPROT  --   --   --   --   --  14.9  INR  --   --   --   --   --  1.15  HEPARINUNFRC  --   < > 0.52 0.57 0.39  --   CREATININE 1.00  --   --  1.06*  --   --   < > = values in this interval not displayed.  Estimated Creatinine Clearance: 66.4 mL/min (by C-G formula based on Cr of 1.06).   Assessment: 70 y/o F on heparin for afib. S/p cardioversion with return to NSR. Heparin level remains therapeutic at 0.39 units/mL this morning. No bleeding noted per RN. Hgb 9.7, plts 412- stable, no bleeding noted.  Plans for cerebral angiogram today to evaluate prior aneurysm prior to starting long term anticoagulation.  Goal of Therapy:  Heparin level 0.3-0.7 units/ml Monitor platelets by anticoagulation protocol: Yes   Plan:  - Continue heparin gtt 1500 units/hr - Daily heparin level and CBC - Follow for transition to PO anticoagulation  Grete Bosko D. Georgie Eduardo, PharmD, BCPS Clinical Pharmacist Pager: [phone] 10/01/2015 9:30 AM

## 2015-10-01 NOTE — Sedation Documentation (Signed)
Vital signs stable. 

## 2015-10-01 NOTE — Consult Note (Signed)
Chief Complaint: Patient was seen in consultation today for cerebral arteriogram Chief Complaint  Patient presents with  . Respiratory Distress   at the request of Dr Huey Bienenstock  Referring Physician(s): Dr Huey Bienenstock  Supervising Physician: Julieanne Cotton  Patient Status: In-pt   History of Present Illness: [name] is a 70 y.o. female   Admitted through ED for COPD exacerbation 09/25/2015 Respiratory distress Hx Cerebral aneurysm Giant basilar aneurysm embolization 2009 Followed by Dr [name] L ICA and MCA pta/stent L Internal carotid artery aneurysm   Now with Atrial fib requiring cardizem and cardioversion Need for anticoagulation Request for evaluation of cerebral arteries and aneurysms prior to start    Past Medical History  Diagnosis Date  . Cerebral aneurysm   . Hypertension   . Atrial fibrillation (HCC)   . Arthritis   . Anemia   . Asthma   . Aneurysm (HCC)     x2  . Complication of anesthesia     Has woken up during surgery before  . Brain aneurysm     have a "giant aneurysm, Dr. Bedelia Person has stented and coiled in past x 4  . Shortness of breath   . Bronchitis     history of  . Sleep apnea     does not use cpap  . Diabetes mellitus     metformin  . Adrenal disorder, other     "adrenal Incidentaloma"  . Urinary tract infection     history of  . GERD (gastroesophageal reflux disease)     omeprazole  . Rheumatoid arthritis(714.0)     has had for approx. 40years, sees Dr. [name]  . Depression     due to loss of spouse 6 years ago  . COPD (chronic obstructive pulmonary disease) (HCC)     Dr. [name], uses 2.5L of o2 as needed  . Emphysema of lung (HCC)     requested anethesia consult   . Obesity   . Acute respiratory failure (HCC) 02/05/2017    Past Surgical History  Procedure Laterality Date  . Tubal ligation    . Hand surgery      bilateral  . Hand surgery      x 2  . Brain surgery      coiling and  stenting  . Mastectomy, partial  02/28/2011    Procedure: MASTECTOMY PARTIAL;  Surgeon: Ernestene Mention, MD;  Location: [facility] OR;  Service: General;  Laterality: Right;  RIGHT PARTIAL MASTECTOMY  WITH NEEDLE LOCALIZATION  . Breast surgery  02/28/11    right partial mastectomy     Allergies: Gold-containing drug products and Tape  Medications: Prior to Admission medications   Medication Sig Start Date End Date Taking? Authorizing Provider  acetaminophen (TYLENOL) 500 MG tablet Take 1 tablet (500 mg total) by mouth 3 (three) times daily. 02/09/15  Yes Ripudeep Jenna Luo, MD  albuterol (PROAIR HFA) 108 (90 BASE) MCG/ACT inhaler Inhale 2 puffs into the lungs every 6 (six) hours as needed. For shortness of breath   Yes Historical Provider, MD  aspirin EC 81 MG tablet Take 81 mg by mouth daily.   Yes Historical Provider, MD  atenolol (TENORMIN) 25 MG tablet Take 25 mg by mouth daily. 12/01/14  Yes Historical Provider, MD  budesonide-formoterol (SYMBICORT) 80-4.5 MCG/ACT inhaler Inhale 2 puffs into the lungs 2 (two) times daily. Reported on 09/25/2015   Yes Historical Provider, MD  Calcium 600-200 MG-UNIT tablet Take 1 tablet by mouth daily.  Yes Historical Provider, MD  ferrous sulfate 325 (65 FE) MG tablet Take 325 mg by mouth daily with breakfast.   Yes Historical Provider, MD  furosemide (LASIX) 20 MG tablet Take 20 mg by mouth daily.   Yes Historical Provider, MD  ibandronate (BONIVA) 150 MG tablet Take 150 mg by mouth every 30 (thirty) days. Take in the morning with a full glass of water, on an empty stomach, and do not take anything else by mouth or lie down for the next 30 min.   Yes Historical Provider, MD  ipratropium-albuterol (DUONEB) 0.5-2.5 (3) MG/3ML SOLN Take 3 mLs by nebulization 3 (three) times daily. 02/09/15  Yes Ripudeep Jenna Luo, MD  metFORMIN (GLUCOPHAGE) 500 MG tablet Take 500 mg by mouth 2 (two) times daily with a meal.     Yes Historical Provider, MD  Multiple Vitamins-Minerals (CENTRUM  SILVER ADULT 50+ PO) Take 1 tablet by mouth daily.   Yes Historical Provider, MD  omeprazole (PRILOSEC) 20 MG capsule Take 20 mg by mouth daily.     Yes Historical Provider, MD  oxybutynin (DITROPAN) 5 MG tablet Take 0.5 tablets (2.5 mg total) by mouth 2 (two) times daily. 02/09/15  Yes Ripudeep Jenna Luo, MD  OXYGEN Inhale 2.5 L into the lungs continuous.   Yes Historical Provider, MD  polyethylene glycol (MIRALAX / GLYCOLAX) packet Take 17 g by mouth daily. 02/09/15  Yes Ripudeep Jenna Luo, MD  senna-docusate (SENOKOT-S) 8.6-50 MG tablet Take 2 tablets by mouth at bedtime. 02/09/15  Yes Ripudeep Jenna Luo, MD  tiotropium (SPIRIVA) 18 MCG inhalation capsule Place 18 mcg into inhaler and inhale daily.    Yes Historical Provider, MD  traZODone (DESYREL) 50 MG tablet Take 50 mg by mouth at bedtime.   Yes Historical Provider, MD  acetaminophen (TYLENOL) 325 MG tablet Take 1 tablet (325 mg total) by mouth every 4 (four) hours as needed for mild pain, moderate pain, fever or headache. 09/28/15   Leana Roe Elgergawy, MD  LORazepam (ATIVAN) 0.5 MG tablet Take 1 tablet (0.5 mg total) by mouth every 8 (eight) hours as needed for anxiety. 09/28/15   Leana Roe Elgergawy, MD  predniSONE (DELTASONE) 10 MG tablet Please take 40 mg daily for 2 days, then 30 mg daily for 2 days, then 20 mg daily for 2 days, then 10 mg daily for 2 days, then stop. 09/28/15   Leana Roe Elgergawy, MD  spironolactone-hydrochlorothiazide (ALDACTAZIDE) 25-25 MG tablet Take 1 tablet by mouth daily. 09/30/15   Starleen Arms, MD     Family History  Problem Relation Age of Onset  . Heart disease Mother   . Cancer Father     lung  . Cancer Sister     breast    Social History   Social History  . Marital Status: Widowed    Spouse Name: N/A  . Number of Children: N/A  . Years of Education: N/A   Social History Main Topics  . Smoking status: Former Smoker -- 1.50 packs/day for 40 years    Types: Cigarettes  . Smokeless tobacco: Never Used      Comment: quit 2006  . Alcohol Use: 0.6 oz/week    1 Shots of liquor per week  . Drug Use: No  . Sexual Activity: Not Asked   Other Topics Concern  . None   Social History Narrative     Review of Systems: A 12 point ROS discussed and pertinent positives are indicated in the HPI above.  All  other systems are negative.  Review of Systems  Constitutional: Positive for activity change and fatigue. Negative for fever and appetite change.  HENT: Negative for tinnitus.   Eyes: Negative for visual disturbance.  Respiratory: Positive for cough and shortness of breath.   Gastrointestinal: Negative for abdominal pain.  Neurological: Negative for dizziness, tremors, seizures, syncope, facial asymmetry, speech difficulty, weakness, light-headedness, numbness and headaches.  Psychiatric/Behavioral: Negative for behavioral problems and confusion.    Vital Signs: BP 111/95 mmHg  Pulse 52  Temp(Src) 97.7 F (36.5 C) (Oral)  Resp 14  Ht 5\' 6"  (1.676 m)  Wt 267 lb (121.11 kg)  BMI 43.12 kg/m2  SpO2 97%  Physical Exam  Constitutional: She is oriented to person, place, and time.  Cardiovascular: Normal rate, regular rhythm and normal heart sounds.   Pulmonary/Chest: Effort normal. She has wheezes.  Abdominal: Soft. Bowel sounds are normal.  Musculoskeletal: Normal range of motion.  Neurological: She is alert and oriented to person, place, and time.  Skin: Skin is warm and dry.  Psychiatric: She has a normal mood and affect. Her behavior is normal. Judgment and thought content normal.  Nursing note and vitals reviewed.   Mallampati Score:  MD Evaluation Airway: WNL Heart: WNL Abdomen: WNL Chest/ Lungs: WNL ASA  Classification: 3 Mallampati/Airway Score: One  Imaging: Dg Chest 2 View  09/25/2015  CLINICAL DATA:  I initial evaluation for acute shortness of breath. EXAM: CHEST  2 VIEW COMPARISON:  Prior study from 02/20/2015. FINDINGS: Mild accentuation of the cardiac silhouette,  likely related to AP technique. Mediastinal silhouette within normal limits. Atheromatous plaque within the intrathoracic aorta. Lungs are normally inflated. Changes related emphysema present. Mild left basilar subsegmental atelectasis/ scar noted. Irregular biapical pleural thickening, stable. No focal infiltrates. Hazy opacity overlying the lower spine on lateral projection favored to be related to overlying soft tissue attenuation. No pulmonary edema or pleural effusion. No pneumothorax. Remotely healed left-sided rib fractures. No acute osseous abnormality. Scoliosis. Osteopenia. IMPRESSION: 1. Emphysema.  No other active cardiopulmonary disease. 2. Atherosclerosis. Electronically Signed   By: Rise Mu M.D.   On: 09/25/2015 05:26   Dg Chest Port 1 View  09/26/2015  CLINICAL DATA:  Short of breath.  COPD. EXAM: PORTABLE CHEST 1 VIEW COMPARISON:  Yesterday FINDINGS: Moderate cardiomegaly. Mild bibasilar atelectasis. Healing left lateral mid rib fractures. No sign of pulmonary edema or consolidation. No pneumothorax. No pleural effusion. IMPRESSION: Cardiomegaly without pulmonary edema. Bibasilar atelectasis. Electronically Signed   By: Jolaine Click M.D.   On: 09/26/2015 08:24    Labs:  CBC:  Recent Labs  09/28/15 0218 09/29/15 0351 09/30/15 0418 10/01/15 0227  WBC 11.8* 14.9* 16.4* 15.1*  HGB 8.7* 9.2* 9.7* 9.7*  HCT 30.1* 31.9* 33.8* 33.4*  PLT 399 430* 435* 412*    COAGS:  Recent Labs  09/25/15 0900 10/01/15 0827  INR 1.27 1.15  APTT 27  --     BMP:  Recent Labs  09/26/15 0714 09/27/15 0430 09/29/15 0351 09/30/15 0418  NA 135 133* 139 139  K 4.7 5.0 4.2 4.0  CL 95* 97* 100* 99*  CO2 31 27 32 31  GLUCOSE 112* 146* 150* 102*  BUN 13 18 20 20   CALCIUM 8.5* 8.5* 8.8* 9.1  CREATININE 1.05* 1.07* 1.00 1.06*  GFRNONAA 53* 52* 56* 52*  GFRAA >60 60* >60 >60    LIVER FUNCTION TESTS: No results for input(s): BILITOT, AST, ALT, ALKPHOS, PROT, ALBUMIN in the  last 8760 hours.  TUMOR  MARKERS: No results for input(s): AFPTM, CEA, CA199, CHROMGRNA in the last 8760 hours.  Assessment and Plan:  Hx giant Basilar artery aneurysm embolization 2009 Followed with dr [name] Now admitted with COPD exacerbation  Atrial fib requiring Cardizem and cardioversion Will need anticoagualation Plan for cerebral arteriogram to evaluate aneurysm and cerebral arteries prior to starting meds Risks and Benefits discussed with the patient including, but not limited to bleeding, infection, vascular injury, contrast induced renal failure, stroke or even death. All of the patient's questions were answered, patient is agreeable to proceed. Consent signed and in chart.  Thank you for this interesting consult.  I greatly enjoyed meeting KELE WITHEM and look forward to participating in their care.  A copy of this report was sent to the requesting provider on this date.  Electronically Signed: Regine Christian A 10/01/2015, 9:25 AM   I spent a total of 40 Minutes    in face to face in clinical consultation, greater than 50% of which was counseling/coordinating care for cerebral arteriogram

## 2015-10-01 NOTE — Progress Notes (Signed)
CSW continuing to follow for SNF placement- per MD note possible DC to SNF 6/13- facility updated  Merlyn Lot, [facility] Clinical Social Worker [phone]

## 2015-10-01 NOTE — Care Management Important Message (Signed)
Important Message  Patient Details  Name: [name] MRN: [id number] Date of Birth: [birth year]   Medicare Important Message Given:  Yes    Kyla Balzarine 10/01/2015, 10:19 AM

## 2015-10-02 ENCOUNTER — Inpatient Hospital Stay (HOSPITAL_COMMUNITY): Payer: Medicare Other

## 2015-10-02 LAB — CBC
HEMATOCRIT: 33.3 % — AB (ref 36.0–46.0)
HEMOGLOBIN: 9.8 g/dL — AB (ref 12.0–15.0)
MCH: 25.5 pg — ABNORMAL LOW (ref 26.0–34.0)
MCHC: 29.4 g/dL — ABNORMAL LOW (ref 30.0–36.0)
MCV: 86.5 fL (ref 78.0–100.0)
Platelets: 413 10*3/uL — ABNORMAL HIGH (ref 150–400)
RBC: 3.85 MIL/uL — ABNORMAL LOW (ref 3.87–5.11)
RDW: 15.5 % (ref 11.5–15.5)
WBC: 12.5 10*3/uL — AB (ref 4.0–10.5)

## 2015-10-02 LAB — HEPATIC FUNCTION PANEL
ALT: 21 U/L (ref 14–54)
AST: 15 U/L (ref 15–41)
Albumin: 2.7 g/dL — ABNORMAL LOW (ref 3.5–5.0)
Alkaline Phosphatase: 39 U/L (ref 38–126)
TOTAL PROTEIN: 5.8 g/dL — AB (ref 6.5–8.1)
Total Bilirubin: 0.4 mg/dL (ref 0.3–1.2)

## 2015-10-02 LAB — GLUCOSE, CAPILLARY
GLUCOSE-CAPILLARY: 114 mg/dL — AB (ref 65–99)
Glucose-Capillary: 130 mg/dL — ABNORMAL HIGH (ref 65–99)
Glucose-Capillary: 197 mg/dL — ABNORMAL HIGH (ref 65–99)
Glucose-Capillary: 220 mg/dL — ABNORMAL HIGH (ref 65–99)

## 2015-10-02 LAB — BASIC METABOLIC PANEL
Anion gap: 7 (ref 5–15)
BUN: 18 mg/dL (ref 6–20)
CHLORIDE: 89 mmol/L — AB (ref 101–111)
CO2: 41 mmol/L — ABNORMAL HIGH (ref 22–32)
CREATININE: 1 mg/dL (ref 0.44–1.00)
Calcium: 9.3 mg/dL (ref 8.9–10.3)
GFR calc non Af Amer: 56 mL/min — ABNORMAL LOW (ref >60)
Glucose, Bld: 152 mg/dL — ABNORMAL HIGH (ref 65–99)
POTASSIUM: 3.9 mmol/L (ref 3.5–5.1)
SODIUM: 137 mmol/L (ref 135–145)

## 2015-10-02 LAB — HEPARIN LEVEL (UNFRACTIONATED): Heparin Unfractionated: <0.1 IU/mL — ABNORMAL LOW (ref 0.30–0.70)

## 2015-10-02 MED ORDER — RIVAROXABAN 20 MG PO TABS: 20.0000 mg | ORAL_TABLET | Freq: Every day | ORAL | Status: DC

## 2015-10-02 MED ORDER — INSULIN ASPART 100 UNIT/ML ~~LOC~~ SOLN: 0.0000 [IU] | SUBCUTANEOUS | Status: DC

## 2015-10-02 MED ORDER — PANTOPRAZOLE SODIUM 40 MG PO TBEC: 40.0000 mg | DELAYED_RELEASE_TABLET | Freq: Every day | ORAL | Status: DC

## 2015-10-02 MED ORDER — RIVAROXABAN 20 MG PO TABS
20.0000 mg | ORAL_TABLET | Freq: Every day | ORAL | Status: DC
Start: 2015-10-02 — End: 2015-10-02

## 2015-10-02 MED ORDER — DILTIAZEM HCL ER COATED BEADS 120 MG PO CP24
120.0000 mg | ORAL_CAPSULE | Freq: Two times a day (BID) | ORAL | Status: DC
Start: 2015-10-02 — End: 2016-01-14

## 2015-10-02 MED ORDER — RIVAROXABAN 20 MG PO TABS
20.0000 mg | ORAL_TABLET | Freq: Once | ORAL | Status: AC
  Administered 2015-10-02: 20 mg via ORAL
  Filled 2015-10-02: qty 1

## 2015-10-02 MED ORDER — PREDNISONE 10 MG PO TABS: ORAL_TABLET | ORAL | Status: DC

## 2015-10-02 MED ORDER — INSULIN GLARGINE 100 UNIT/ML ~~LOC~~ SOLN: 5.0000 [IU] | Freq: Every day | SUBCUTANEOUS | Status: DC

## 2015-10-02 NOTE — Discharge Instructions (Signed)
Follow with Primary MD [name], MD after discharge from SNF  Get CBC, CMP, 2 view Chest X ray checked  by Primary MD next visit.    Activity: As tolerated with Full fall precautions use walker/cane & assistance as needed   Disposition SNF   Diet: Heart Healthy , carbohydrate modified , with feeding assistance and aspiration precautions.  For Heart failure patients - Check your Weight same time everyday, if you gain over 2 pounds, or you develop in leg swelling, experience more shortness of breath or chest pain, call your Primary MD immediately. Follow Cardiac Low Salt Diet and 1.5 lit/day fluid restriction.   On your next visit with your primary care physician please Get Medicines reviewed and adjusted.   Please request your Prim.MD to go over all Hospital Tests and Procedure/Radiological results at the follow up, please get all Hospital records sent to your Prim MD by signing hospital release before you go home.   If you experience worsening of your admission symptoms, develop shortness of breath, life threatening emergency, suicidal or homicidal thoughts you must seek medical attention immediately by calling 911 or calling your MD immediately  if symptoms less severe.  You Must read complete instructions/literature along with all the possible adverse reactions/side effects for all the Medicines you take and that have been prescribed to you. Take any new Medicines after you have completely understood and accpet all the possible adverse reactions/side effects.   Do not drive, operating heavy machinery, perform activities at heights, swimming or participation in water activities or provide baby sitting services if your were admitted for syncope or siezures until you have seen by Primary MD or a Neurologist and advised to do so again.  Do not drive when taking Pain medications.    Do not take more than prescribed Pain, Sleep and Anxiety Medications  Special Instructions: If you  have smoked or chewed Tobacco  in the last 2 yrs please stop smoking, stop any regular Alcohol  and or any Recreational drug use.  Wear Seat belts while driving.   Please note  You were cared for by a hospitalist during your hospital stay. If you have any questions about your discharge medications or the care you received while you were in the hospital after you are discharged, you can call the unit and asked to speak with the hospitalist on call if the hospitalist that took care of you is not available. Once you are discharged, your primary care physician will handle any further medical issues. Please note that NO REFILLS for any discharge medications will be authorized once you are discharged, as it is imperative that you return to your primary care physician (or establish a relationship with a primary care physician if you do not have one) for your aftercare needs so that they can reassess your need for medications and monitor your lab values.   Information on my medicine - XARELTO (Rivaroxaban)  This medication education was reviewed with me or my healthcare representative as part of my discharge preparation.  Why was Xarelto prescribed for you? Xarelto was prescribed for you to reduce the risk of a blood clot forming that can cause a stroke if you have a medical condition called atrial fibrillation (a type of irregular heartbeat).  What do you need to know about xarelto ? Take your Xarelto ONCE DAILY at the same time every day with your evening meal. If you have difficulty swallowing the tablet whole, you may crush it and mix in  applesauce just prior to taking your dose.  Take Xarelto exactly as prescribed by your doctor and DO NOT stop taking Xarelto without talking to the doctor who prescribed the medication.  Stopping without other stroke prevention medication to take the place of Xarelto may increase your risk of developing a clot that causes a stroke.  Refill your prescription  before you run out.  After discharge, you should have regular check-up appointments with your healthcare provider that is prescribing your Xarelto.  In the future your dose may need to be changed if your kidney function or weight changes by a significant amount.  What do you do if you miss a dose? If you are taking Xarelto ONCE DAILY and you miss a dose, take it as soon as you remember on the same day then continue your regularly scheduled once daily regimen the next day. Do not take two doses of Xarelto at the same time or on the same day.   Important Safety Information A possible side effect of Xarelto is bleeding. You should call your healthcare provider right away if you experience any of the following: ? Bleeding from an injury or your nose that does not stop. ? Unusual colored urine (red or dark brown) or unusual colored stools (red or black). ? Unusual bruising for unknown reasons. ? A serious fall or if you hit your head (even if there is no bleeding).  Some medicines may interact with Xarelto and might increase your risk of bleeding while on Xarelto. To help avoid this, consult your healthcare provider or pharmacist prior to using any new prescription or non-prescription medications, including herbals, vitamins, non-steroidal anti-inflammatory drugs (NSAIDs) and supplements.  This website has more information on Xarelto: VisitDestination.com.br.    DILTIAZEM  Brand Names: [place] Cardizem; Cardizem CD; Cardizem LA; Cartia XT; Dilacor XR [DSC]; Dilt-CD [DSC]; Dilt-XR; DilTIAZem CD; Diltzac [DSC]; Matzim LA; Taztia XT; Tiazac  What is this drug used for?  It is used to treat high blood pressure.  It is used to treat chest pain or pressure.  It is used to treat certain types of abnormal heartbeats.  It may be given to you for other reasons. Talk with the doctor.   This is not a list of all drugs or health problems that interact with this drug. Tell your doctor and pharmacist about  all of your drugs (prescription or OTC, natural products, vitamins) and health problems. You must check to make sure that it is safe for you to take this drug with all of your drugs and health problems. Do not start, stop, or change the dose of any drug without checking with your doctor.  What are some things I need to know or do while I take this drug?  Tell all of your health care providers that you take this drug. This includes your doctors, nurses, pharmacists, and dentists.  Avoid driving and doing other tasks or actions that call for you to be alert until you see how this drug affects you.  To lower the chance of feeling dizzy or passing out, rise slowly over a few minutes when sitting or lying down. Be careful climbing stairs.  Talk with your doctor before you drink alcohol.  Check blood pressure and heart rate as the doctor has told you. Talk with the doctor.  You may need to have an ECG checked before starting this drug and while taking it. Talk with your doctor.  If you are taking this drug and have high blood  pressure, talk with your doctor before using OTC products that may raise blood pressure. These include cough or cold drugs, diet pills, stimulants, ibuprofen or like products, and some natural products or aids.  If you drink grapefruit juice or eat grapefruit often, talk with your doctor.  This drug may affect how much of some other drugs are in your body. If you are taking other drugs, talk with your doctor. You may need to have your blood work checked more closely while taking this drug with your other drugs.  If you are 61 or older, use this drug with care. You could have more side effects.  Tell your doctor if you are pregnant or plan on getting pregnant. You will need to talk about the benefits and risks of using this drug while you are pregnant. What are some side effects that I need to call my doctor about right away? WARNING/CAUTION: Even though it may be rare, some  people may have very bad and sometimes deadly side effects when taking a drug. Tell your doctor or get medical help right away if you have any of the following signs or symptoms that may be related to a very bad side effect:  Signs of an allergic reaction, like rash; hives; itching; red, swollen, blistered, or peeling skin with or without fever; wheezing; tightness in the chest or throat; trouble breathing or talking; unusual hoarseness; or swelling of the mouth, face, lips, tongue, or throat.  Signs of liver problems like dark urine, feeling tired, not hungry, upset stomach or stomach pain, light-colored stools, throwing up, or yellow skin or eyes.  Very bad dizziness or passing out.  Slow heartbeat.  A new or worse heartbeat that does not feel normal.  Shortness of breath, a big weight gain, or swelling in the arms or legs.  A very bad skin reaction (Stevens-Johnson syndrome/toxic epidermal necrolysis) may happen. It can cause very bad health problems that may not go away, and sometimes death. Get medical help right away if you have signs like red, swollen, blistered, or peeling skin (with or without fever); red or irritated eyes; or sores in your mouth, throat, nose, or eyes.  What are some other side effects of this drug? All drugs may cause side effects. However, many people have no side effects or only have minor side effects. Call your doctor or get medical help if any of these side effects or any other side effects bother you or do not go away: All products:  Flushing. All oral products:  Headache.  Upset stomach.  Dizziness.  Feeling tired or weak.  Runny nose.  Sore throat. Injection:  Irritation where the shot is given. These are not all of the side effects that may occur. If you have questions about side effects, call your doctor. Call your doctor for medical advice about side effects. You may report side effects to your national health agency.  How is this drug  best taken? Use this drug as ordered by your doctor. Read all information given to you. Follow all instructions closely. All oral products:  To gain the most benefit, do not miss doses.  Swallow whole. Do not chew, break, or crush.  Keep taking this drug as you have been told by your doctor or other health care provider, even if you feel well. Long-acting capsules (24 hour):  Some drugs may need to be taken with food or on an empty stomach. For some drugs it does not matter. Check with your pharmacist  about how to take this drug.  Some products may be opened and sprinkled on a spoonful of applesauce. Some products must be swallowed whole. Check with your pharmacist to see if you can open this product.  Take with or without food.  What do I do if I miss a dose? All oral products:  Take a missed dose as soon as you think about it.  If it is close to the time for your next dose, skip the missed dose and go back to your normal time.  Do not take 2 doses at the same time or extra doses.  All oral products:  Store at room temperature.  Protect from light.  Store in a dry place. Do not store in a bathroom.

## 2015-10-02 NOTE — Clinical Social Work Placement (Signed)
   CLINICAL SOCIAL WORK PLACEMENT  NOTE  Date:  10/02/2015  Patient Details  Name: [name] MRN: [id number] Date of Birth: Aug 25, 1945  Clinical Social Work is seeking post-discharge placement for this patient at the Skilled  Nursing Facility level of care (*CSW will initial, date and re-position this form in  chart as items are completed):      Patient/family provided with [facility] Clinical Social Work Department's list of facilities offering this level of care within the geographic area requested by the patient (or if unable, by the patient's family).      Patient/family informed of their freedom to choose among providers that offer the needed level of care, that participate in Medicare, Medicaid or managed care program needed by the patient, have an available bed and are willing to accept the patient.      Patient/family informed of [place] [facility]'s ownership interest in [facility] and [facility], as well as of the fact that they are under no obligation to receive care at these facilities.  PASRR submitted to EDS on 09/27/15     PASRR number received on 09/27/15     Existing PASRR number confirmed on       FL2 transmitted to all facilities in geographic area requested by pt/family on 09/27/15     FL2 transmitted to all facilities within larger geographic area on       Patient informed that his/her managed care company has contracts with or will negotiate with certain facilities, including the following:        Yes   Patient/family informed of bed offers received.  Patient chooses bed at [facility]     Physician recommends and patient chooses bed at      Patient to be transferred to [facility] on 10/02/15.  Patient to be transferred to facility by [facility]     Patient family notified on 10/02/15 of transfer.  Name of family member notified:  Patient calling family     PHYSICIAN       Additional Comment:     _______________________________________________ Mearl Latin, LCSWA 10/02/2015, 11:43 AM

## 2015-10-02 NOTE — Progress Notes (Signed)
Patient will DC to: [facility] Anticipated DC date: 10/02/15 Family notified: Patient will contact family with cell phone Transport by: [facility] 1:30pm   Per MD patient ready for DC to [facility]. RN, patient, patient's family, and facility notified of DC. RN given number for report. DC packet on chart. Ambulance transport requested for patient.   CSW signing off.  Cristobal Goldmann, [place] Clinical Social Worker [phone]

## 2015-10-02 NOTE — Progress Notes (Signed)
ANTICOAGULATION CONSULT NOTE - Follow Up Consult  Pharmacy Consult for Heparin  Indication: atrial fibrillation  Allergies  Allergen Reactions  . Gold-Containing Drug Products Other (See Comments)    Blisters   . Tape Dermatitis    Patient Measurements: Height: 5\' 6"  (167.6 cm) Weight: 267 lb (121.11 kg) IBW/kg (Calculated) : 59.3  Vital Signs: Temp: 98.6 F (37 C) (06/13 0756) Temp Source: Oral (06/13 0756) BP: 141/59 mmHg (06/13 0802) Pulse Rate: 60 (06/13 0802)  Labs:  Recent Labs  09/30/15 0418 10/01/15 0227 10/01/15 0827 10/02/15 0306  HGB 9.7* 9.7*  --  9.8*  HCT 33.8* 33.4*  --  33.3*  PLT 435* 412*  --  413*  LABPROT  --   --  14.9  --   INR  --   --  1.15  --   HEPARINUNFRC 0.57 0.39  --  <0.10*  CREATININE 1.06*  --   --  1.00    Estimated Creatinine Clearance: 70.4 mL/min (by C-G formula based on Cr of 1).   Assessment 70 y/o F on heparin for afib. S/p cardioversion with return to NSR. Heparin level undetectable due to heparin not being re-started s/p angiogram until ~0100 this AM- it is currently running (confirmed when I went to speak with her) at 1500 units/hr.  To transition to Xarelto for AFib tonight. SCr 1, CrCl >12mL/min. Hgb 9.8, plts 413- no bleeding noted.  Goal of Therapy:  Heparin level 0.3-0.7 units/ml Monitor platelets by anticoagulation protocol: Yes   Plan -Continue heparin at 1500 units/hr until 1659 tonight, then to receive first dose of Xarelto 20mg  qsupper at 1700 -CBC q72h while here (though discharging possibly today per my discussion with patient/RN) -education completed  Mellany Dinsmore D. Leticia Coletta, PharmD, BCPS Clinical Pharmacist Pager: [phone] 10/02/2015 9:20 AM

## 2015-10-02 NOTE — Care Management Note (Addendum)
Case Management Note  Patient Details  Name: [name] MRN: [id number] Date of Birth: [birth year]  Subjective/Objective:   Per CMA:  Xarelto CO-PAY- $ 35.00, PRIOR APPROVAL NEEDED - # [phone].  TC to [employer] [facility]: PRIOR APPROVAL GRANTED BY [employer], [place] #[id number].  Pt provided with card for free 30-day trial.             Expected Discharge Plan:  Skilled Nursing Facility  In-House Referral:  Clinical Social Work  Discharge planning Services  CM Consult, Medication Assistance  Status of Service:  Completed, signed off  Medicare Important Message Given:  Yes  Magdalene River, RN 10/02/2015, 8:36 AM

## 2015-10-02 NOTE — Discharge Summary (Signed)
[name], is a 70 y.o. female  DOB [birth year]  MRN [id number].  Admission date:  09/25/2015  Admitting Physician  Eduard Clos, MD  Discharge Date:  10/02/2015   Primary MD  [name], MD  Recommendations for primary care physician for things to follow:  - Please check CBC, BMP in 3 days, and check CBC every 72 hours 3 as she is started on Xarelto - Patient on nasal cannula 2.5-3 L continuous - Follow with cardiology Dr. Jacinto Halim as an outpatient   Admission Diagnosis  COPD exacerbation (HCC) [J44.1]   Discharge Diagnosis  COPD exacerbation (HCC) [J44.1]    Principal Problem:   Sepsis (HCC) Active Problems:   Diabetes mellitus type II, uncontrolled (HCC)   OSA (obstructive sleep apnea)   Obesity   Iron deficiency anemia   Acute on chronic respiratory failure with hypoxia (HCC)   Hypertension   Acute bronchitis with COPD (HCC)   Acute hyponatremia   Increased anion gap metabolic acidosis   Leukocytosis   Hypokalemia   Atrial fibrillation [facility])      Past Medical History  Diagnosis Date  . Cerebral aneurysm   . Hypertension   . Atrial fibrillation (HCC)   . Arthritis   . Anemia   . Asthma   . Aneurysm (HCC)     x2  . Complication of anesthesia     Has woken up during surgery before  . Brain aneurysm     have a "giant aneurysm, Dr. Bedelia Person has stented and coiled in past x 4  . Shortness of breath   . Bronchitis     history of  . Sleep apnea     does not use cpap  . Diabetes mellitus     metformin  . Adrenal disorder, other     "adrenal Incidentaloma"  . Urinary tract infection     history of  . GERD (gastroesophageal reflux disease)     omeprazole  . Rheumatoid arthritis(714.0)     has had for approx. 40years, sees Dr. [name]  . Depression     due to loss of spouse 6 years ago  . COPD (chronic obstructive pulmonary disease) (HCC)     Dr. [name], uses  2.5L of o2 as needed  . Emphysema of lung (HCC)     requested anethesia consult   . Obesity   . Acute respiratory failure (HCC) 02/05/2017    Past Surgical History  Procedure Laterality Date  . Tubal ligation    . Hand surgery      bilateral  . Hand surgery      x 2  . Brain surgery      coiling and stenting  . Mastectomy, partial  02/28/2011    Procedure: MASTECTOMY PARTIAL;  Surgeon: Ernestene Mention, MD;  Location: [facility] OR;  Service: General;  Laterality: Right;  RIGHT PARTIAL MASTECTOMY  WITH NEEDLE LOCALIZATION  . Breast surgery  02/28/11    right partial mastectomy   . Cardioversion N/A 09/29/2015  Procedure: CARDIOVERSION;  Surgeon: [name], MD;  Location: [facility] OR;  Service: Cardiovascular;  Laterality: N/A;       History of present illness and  Hospital Course:     Kindly see H&P for history of present illness and admission details, please review complete Labs, Consult reports and Test reports for all details in brief  HPI  from the history and physical done on the day of admission 09/25/2015 HPI: [name] is a 70 y.o. female with medical history significant for COPD on oxygen 3 L/m, sleep apnea patient reports not on CPAP, hypertension, diabetes on metformin, obesity, iron deficiency anemia who presented to the ER with reports of shortness of breath, weakness, fever and generalized pain. EMS was called to the patient's home and they found the patient with a heart rate between 131 40 bpm a low-grade temperature of 99 with a respiratory rate of 25. She was hypertensive with a BP 150/79 and pulse oximetry was 92% on 4 L nasal cannula oxygen. She was given albuterol and Atrovent nebulizers was 125 mg of Solu-Medrol prior to arrival to this facility.  Upon my evaluation of the patient she was quite lethargic and difficult to arouse. Because of this history is limited. Patient reports she has been sick for about 2 weeks but did not seek any medical attention from her primary care  physician. She reports coughing up thick sputum but was unable to truly characterize the color of the sputum. She denied nausea or vomiting or GI symptoms. She denied genitourinary symptoms.   Hospital Course  70 y.o. female with a Past Medical History of cerebral aneurysm, HTN, Afib, Anemia, asthma, DM, adrenal mass, GERD, RA, depression, COPD, who presents with Sepsis and COPD exacerbation w/ likely CAP, With significant hypercapnia, requiring BiPAP initially , Significantly improved respiratory status, back on oxygen via nasal cannula , leukocytosis trending down, is likely due to to viral GI illnesses , patient developed A. fib with RVR, Required Cardizem drip, cardiology consulted regarding further management, status post cardioversion on 09/29/2015, currently normal sinus rhythm, hasd cerebral angiogram 6/12 to evaluate for cerebral aneurysm. No contraindication for anticoagulation.  Acute on chronic hypercapnic/hypoxic respiratory failure. - Patient with baseline COPD, on on home oxygen  - Was significant hypercapnia,Initially requiring BiPAP. - due to COPD exacerbation, as well very likely acute bronchitis .  COPD exacerbation - Treated with Rocephin and azithromycin,  - treatedwith IV steroids, transitioned to by mouth prednisone - Continue with nebulizer treatment, Spiriva and Dulera - Leukocytosis most likely related to steroids  A. fib with RVR - Patient with new onset A. fib with RVR, cardiology consult greatly appreciated, initially on Cardizem drip, digoxin and beta blockers, status post cardioversion. Remains in NSR. -Continue with Cardizem CD 120 mg twice a day, atenolol 25 mg oral daily - CHA2DS2-VASCScore: 4 (without stroke as it was related to aneurysm),History of cerebral aneurysm S/P coiling with small neck and small residual aneurysm present by cerebral angio 10/01/2015. No contraindication for anticoagulation.  Diabetes mellitus - Continue to hold metformin, will  discharge on insulin sliding scale and Lantus.  SIRS - Patient presented with significant leukocytosis, elevated lactic acid, reports fever and abdominal pain at home, possibly related to viral GI infection, abdominal pain resolved, benign abdominal exam , leukocytosis trending down, no further diarrhea.  Anemia - Anemia of chronic disease , hemoglobin 11.9 on admission, currently 9 , most likely delusional factor contributing giving aggressive IV hydration, Hemoccult negative, iron level of 23, ferritin  of the 398, normal folate and B-12 WNL . - Opinion with iron supplement and multivitamin on discharge  Hypertension  - Acceptable on diltiazem and atenolol  Hyponatremia - Resolved  Hypokalemia - Significantly improved  Hypomagnesemia - Repleted  Patient had complained of right flank pain, this is most likely related to atelectasis, encouraged to use incentive spirometry, negative right upper quadrant ultrasound, normal LFTs   Discharge Condition:  stable  Follow UP  Follow-up Information    Follow up with [name], MD.   Specialty:  Internal Medicine   Contact information:   [address] [place] [address] [phone]       Follow up with [facility] .   Specialty:  Skilled Nursing Facility   Contact information:   [address] [place] [address] [phone]      Follow up with [name], MD On 10/17/2015.   Specialty:  Cardiology   Why:  Appointment at 11 AM. Come 15 min early. Bring all medications and papers   Contact information:   [address] [place] [address] [phone]       Schedule an appointment as soon as possible for a visit with [name], MD.   Specialty:  Internal Medicine   Why:  After discharge from SNF   Contact information:   [address] [place] [address] [phone]         Discharge Instructions  and  Discharge  Medications     Discharge Instructions    Discharge instructions    Complete by:  As directed   Follow with Primary MD [name], MD after discharge from SNF - Least check CBC every 72 hours X 3 as she is started on Xarelto.  Get CBC, CMP, 2 view Chest X ray checked  by Primary MD next visit.    Activity: As tolerated with Full fall precautions use walker/cane & assistance as needed   Disposition SNF   Diet: Heart Healthy , carbohydrate modified , with feeding assistance and aspiration precautions.  For Heart failure patients - Check your Weight same time everyday, if you gain over 2 pounds, or you develop in leg swelling, experience more shortness of breath or chest pain, call your Primary MD immediately. Follow Cardiac Low Salt Diet and 1.5 lit/day fluid restriction.   On your next visit with your primary care physician please Get Medicines reviewed and adjusted.   Please request your Prim.MD to go over all Hospital Tests and Procedure/Radiological results at the follow up, please get all Hospital records sent to your Prim MD by signing hospital release before you go home.   If you experience worsening of your admission symptoms, develop shortness of breath, life threatening emergency, suicidal or homicidal thoughts you must seek medical attention immediately by calling 911 or calling your MD immediately  if symptoms less severe.  You Must read complete instructions/literature along with all the possible adverse reactions/side effects for all the Medicines you take and that have been prescribed to you. Take any new Medicines after you have completely understood and accpet all the possible adverse reactions/side effects.   Do not drive, operating heavy machinery, perform activities at heights, swimming or participation in water activities or provide baby sitting services if your were admitted for syncope or siezures until you have seen by Primary MD or a Neurologist and  advised to do so again.  Do not drive when taking Pain  medications.    Do not take more than prescribed Pain, Sleep and Anxiety Medications  Special Instructions: If you have smoked or chewed Tobacco  in the last 2 yrs please stop smoking, stop any regular Alcohol  and or any Recreational drug use.  Wear Seat belts while driving.   Please note  You were cared for by a hospitalist during your hospital stay. If you have any questions about your discharge medications or the care you received while you were in the hospital after you are discharged, you can call the unit and asked to speak with the hospitalist on call if the hospitalist that took care of you is not available. Once you are discharged, your primary care physician will handle any further medical issues. Please note that NO REFILLS for any discharge medications will be authorized once you are discharged, as it is imperative that you return to your primary care physician (or establish a relationship with a primary care physician if you do not have one) for your aftercare needs so that they can reassess your need for medications and monitor your lab values.            Medication List    STOP taking these medications        aspirin EC 81 MG tablet     metFORMIN 500 MG tablet  Commonly known as:  GLUCOPHAGE     omeprazole 20 MG capsule  Commonly known as:  PRILOSEC     spironolactone-hydrochlorothiazide 25-25 MG tablet  Commonly known as:  ALDACTAZIDE      TAKE these medications        acetaminophen 325 MG tablet  Commonly known as:  TYLENOL  Take 1 tablet (325 mg total) by mouth every 4 (four) hours as needed for mild pain, moderate pain, fever or headache.     atenolol 25 MG tablet  Commonly known as:  TENORMIN  Take 25 mg by mouth daily.     budesonide-formoterol 80-4.5 MCG/ACT inhaler  Commonly known as:  SYMBICORT  Inhale 2 puffs into the lungs 2 (two) times daily. Reported on 09/25/2015     Calcium 600-200  MG-UNIT tablet  Take 1 tablet by mouth daily.     CENTRUM SILVER ADULT 50+ PO  Take 1 tablet by mouth daily.     diltiazem 120 MG 24 hr capsule  Commonly known as:  CARDIZEM CD  Take 1 capsule (120 mg total) by mouth 2 (two) times daily.     ferrous sulfate 325 (65 FE) MG tablet  Take 325 mg by mouth daily with breakfast.     furosemide 20 MG tablet  Commonly known as:  LASIX  Take 20 mg by mouth daily.     ibandronate 150 MG tablet  Commonly known as:  BONIVA  Take 150 mg by mouth every 30 (thirty) days. Take in the morning with a full glass of water, on an empty stomach, and do not take anything else by mouth or lie down for the next 30 min.     insulin aspart 100 UNIT/ML injection  Commonly known as:  novoLOG  Inject 0-15 Units into the skin every 4 (four) hours.     insulin glargine 100 UNIT/ML injection  Commonly known as:  LANTUS  Inject 0.05 mLs (5 Units total) into the skin daily.     ipratropium-albuterol 0.5-2.5 (3) MG/3ML Soln  Commonly known as:  DUONEB  Take 3 mLs by nebulization 3 (three) times daily.  LORazepam 0.5 MG tablet  Commonly known as:  ATIVAN  Take 1 tablet (0.5 mg total) by mouth every 8 (eight) hours as needed for anxiety.     oxybutynin 5 MG tablet  Commonly known as:  DITROPAN  Take 0.5 tablets (2.5 mg total) by mouth 2 (two) times daily.     OXYGEN  Inhale 2.5 L into the lungs continuous.     pantoprazole 40 MG tablet  Commonly known as:  PROTONIX  Take 1 tablet (40 mg total) by mouth daily.     polyethylene glycol packet  Commonly known as:  MIRALAX / GLYCOLAX  Take 17 g by mouth daily.     predniSONE 10 MG tablet  Commonly known as:  DELTASONE  Please take   20 mg daily for 2 days, then 10 mg daily for 2 days, then stop.     PROAIR HFA 108 (90 Base) MCG/ACT inhaler  Generic drug:  albuterol  Inhale 2 puffs into the lungs every 6 (six) hours as needed. For shortness of breath     rivaroxaban 20 MG Tabs tablet  Commonly  known as:  XARELTO  Take 1 tablet (20 mg total) by mouth daily with supper. Start tomorrow on 6/14, already received dose during hospital stay today  Start taking on:  10/03/2015     senna-docusate 8.6-50 MG tablet  Commonly known as:  Senokot-S  Take 2 tablets by mouth at bedtime.     tiotropium 18 MCG inhalation capsule  Commonly known as:  SPIRIVA  Place 18 mcg into inhaler and inhale daily.     traZODone 50 MG tablet  Commonly known as:  DESYREL  Take 50 mg by mouth at bedtime.          Diet and Activity recommendation: See Discharge Instructions above   Consults obtained -  Cardiology Interventional neuroradiology Dr. Titus Dubin   Major procedures and Radiology Reports - PLEASE review detailed and final reports for all details, in brief -  Cardioversion 09/29/2015 Cerebral angiogram 10/01/2015    Dg Chest 2 View  09/25/2015  CLINICAL DATA:  I initial evaluation for acute shortness of breath. EXAM: CHEST  2 VIEW COMPARISON:  Prior study from 02/20/2015. FINDINGS: Mild accentuation of the cardiac silhouette, likely related to AP technique. Mediastinal silhouette within normal limits. Atheromatous plaque within the intrathoracic aorta. Lungs are normally inflated. Changes related emphysema present. Mild left basilar subsegmental atelectasis/ scar noted. Irregular biapical pleural thickening, stable. No focal infiltrates. Hazy opacity overlying the lower spine on lateral projection favored to be related to overlying soft tissue attenuation. No pulmonary edema or pleural effusion. No pneumothorax. Remotely healed left-sided rib fractures. No acute osseous abnormality. Scoliosis. Osteopenia. IMPRESSION: 1. Emphysema.  No other active cardiopulmonary disease. 2. Atherosclerosis. Electronically Signed   By: Rise Mu M.D.   On: 09/25/2015 05:26   Dg Chest Port 1 View  09/26/2015  CLINICAL DATA:  Short of breath.  COPD. EXAM: PORTABLE CHEST 1 VIEW COMPARISON:  Yesterday  FINDINGS: Moderate cardiomegaly. Mild bibasilar atelectasis. Healing left lateral mid rib fractures. No sign of pulmonary edema or consolidation. No pneumothorax. No pleural effusion. IMPRESSION: Cardiomegaly without pulmonary edema. Bibasilar atelectasis. Electronically Signed   By: Jolaine Click M.D.   On: 09/26/2015 08:24   US Abdomen Limited Ruq  10/02/2015  CLINICAL DATA:  Right upper quadrant for 1 day EXAM: US ABDOMEN LIMITED - RIGHT UPPER QUADRANT COMPARISON:  CT scan abdomen and pelvis 06/03/2011 FINDINGS: Gallbladder: No gallstones are noted within  gallbladder. No thickening of gallbladder wall. No sonographic Murphy's sign. Common bile duct: Diameter: 6 mm in diameter within normal limits. Liver: No focal lesion identified. Mild heterogeneous liver echogenicity without intrahepatic biliary ductal dilatation. IMPRESSION: 1. No gallstones are noted within gallbladder. Normal CBD. No sonographic Murphy's sign. Electronically Signed   By: Natasha Mead M.D.   On: 10/02/2015 10:15    Micro Results    Recent Results (from the past 240 hour(s))  Culture, blood (Routine X 2) w Reflex to ID Panel     Status: None   Collection Time: 09/25/15  4:58 AM  Result Value Ref Range Status   Specimen Description BLOOD RIGHT FOREARM  Final   Special Requests BOTTLES DRAWN AEROBIC AND ANAEROBIC  Final   Culture NO GROWTH 5 DAYS  Final   Report Status 09/30/2015 FINAL  Final  Culture, blood (Routine X 2) w Reflex to ID Panel     Status: None   Collection Time: 09/25/15  5:58 AM  Result Value Ref Range Status   Specimen Description BLOOD RIGHT HAND  Final   Special Requests BOTTLES DRAWN AEROBIC AND ANAEROBIC  Final   Culture NO GROWTH 5 DAYS  Final   Report Status 09/30/2015 FINAL  Final  Urine culture     Status: None   Collection Time: 09/25/15  8:38 AM  Result Value Ref Range Status   Specimen Description URINE, CATHETERIZED  Final   Special Requests NONE  Final   Culture NO GROWTH  Final    Report Status 09/26/2015 FINAL  Final  MRSA PCR Screening     Status: None   Collection Time: 09/26/15  5:58 AM  Result Value Ref Range Status   MRSA by PCR NEGATIVE NEGATIVE Final    Comment:        The GeneXpert MRSA Assay (FDA approved for NASAL specimens only), is one component of a comprehensive MRSA colonization surveillance program. It is not intended to diagnose MRSA infection nor to guide or monitor treatment for MRSA infections.        Today   Subjective:   [name] today has no headache,no chestno new weakness tingling or numbness, complaints failure during the day for right flank pain, resolved, feels much better .  Objective:   Blood pressure 141/59, pulse 60, temperature 98.6 F (37 C), temperature source Oral, resp. rate 17, height 5\' 6"  (1.676 m), weight 121.11 kg (267 lb), SpO2 96 %.   Intake/Output Summary (Last 24 hours) at 10/02/15 1101 Last data filed at 10/02/15 0600  Gross per 24 hour  Intake    331 ml  Output   1350 ml  Net  -1019 ml    Exam Awake Alert, Oriented x 3, No new F.N deficits, Normal affect [place].AT,PERRAL Supple Neck,No JVD, No cervical lymphadenopathy appriciated.  Symmetrical Chest wall movement, Good air movement bilaterally, CTAB RRR,No Gallops,Rubs or new Murmurs, No Parasternal Heave +ve B.Sounds, Abd Soft, Non tender No Cyanosis, Clubbing or edema, No new Rash or bruise  Data Review   CBC w Diff: Lab Results  Component Value Date   WBC 12.5* 10/02/2015   HGB 9.8* 10/02/2015   HCT 33.3* 10/02/2015   PLT 413* 10/02/2015   LYMPHOPCT 7 09/25/2015   MONOPCT 6 09/25/2015   EOSPCT 0 09/25/2015   BASOPCT 0 09/25/2015    CMP: Lab Results  Component Value Date   NA 137 10/02/2015   K 3.9 10/02/2015   CL 89* 10/02/2015   CO2  41* 10/02/2015   BUN 18 10/02/2015   CREATININE 1.00 10/02/2015   PROT 5.8* 10/02/2015   ALBUMIN 2.7* 10/02/2015   BILITOT 0.4 10/02/2015   ALKPHOS 39 10/02/2015   AST 15 10/02/2015    ALT 21 10/02/2015  .   Total Time in preparing paper work, data evaluation and todays exam - 35 minutes  Lina Hitch M.D on 10/02/2015 at 11:01 AM  [facility]   Office  [phone]

## 2015-10-02 NOTE — Consult Note (Signed)
   [facility] CM Inpatient Consult   10/02/2015  [name] 06/04/1945 [id number]  Patient screened for potential [facility] Care Management services. Patient is eligible for [facility] for COPD exacerbation.This 70 y.o. female admitted to with SOB, weakness, fever, and generalized pain. Dx: Sepsis, acute on chronic respiratory failrue with hypoxia/acute bronchitis. PMH includes: COPD, HTN, Depression, A-Fib, arthritis, and asthma.   Electronic medical record reveals patient's discharge plan is for a Skilled nursing facility stay and  there were no identifiable [facility] care management needs for community follow up at this time.   If patient's post hospital needs change please place a [facility] Care Management consult. For questions please contact:   Charlesetta Shanks, RN BSN CCM [facility]  [phone] business mobile phone Toll free office [phone]

## 2015-10-02 NOTE — Progress Notes (Signed)
OT Cancellation Note  Patient Details Name: ETHELEEN VALTIERRA MRN: [id number] DOB: 1946/02/03   Cancelled Treatment:    Reason Eval/Treat Not Completed: Patient at procedure or test/ unavailable. Pt is currently down for abdominal ultrasound. Will check back as schedule allows.  Evette Georges [phone] 10/02/2015, 10:05 AM

## 2015-10-02 NOTE — Progress Notes (Signed)
ANTICOAGULATION CONSULT NOTE - Follow Up Consult  Pharmacy Consult for Heparin  Indication: atrial fibrillation  Allergies  Allergen Reactions  . Gold-Containing Drug Products Other (See Comments)    Blisters   . Tape Dermatitis    Patient Measurements: Height: 5\' 6"  (167.6 cm) Weight: 267 lb (121.11 kg) IBW/kg (Calculated) : 59.3  Vital Signs: Temp: 97.9 F (36.6 C) (06/13 0400) Temp Source: Axillary (06/13 0400) BP: 123/54 mmHg (06/13 0000) Pulse Rate: 54 (06/13 0400)  Labs:  Recent Labs  09/30/15 0418 10/01/15 0227 10/01/15 0827 10/02/15 0306  HGB 9.7* 9.7*  --  9.8*  HCT 33.8* 33.4*  --  33.3*  PLT 435* 412*  --  413*  LABPROT  --   --  14.9  --   INR  --   --  1.15  --   HEPARINUNFRC 0.57 0.39  --  <0.10*  CREATININE 1.06*  --   --  1.00    Estimated Creatinine Clearance: 70.4 mL/min (by C-G formula based on Cr of 1).   Assessment Heparin level undetectable due to heparin not being re-started s/p angiogram until ~0100 this AM  Goal of Therapy:  Heparin level 0.3-0.7 units/ml Monitor platelets by anticoagulation protocol: Yes   Plan -Continue heparin at 1500 units/hr -Check 1000 HL  10/04/15 10/02/2015,4:20 AM

## 2015-10-02 NOTE — Progress Notes (Signed)
Subjective:  Doing well. Baseline dyspnea only. C/O CP on deep breath. Has been scheduled for abdominal US. On IV heparin  Objective:  Vital Signs in the last 24 hours: Temp:  [97.7 F (36.5 C)-98.6 F (37 C)] 98.6 F (37 C) (06/13 0756) Pulse Rate:  [49-63] 60 (06/13 0802) Resp:  [10-25] 17 (06/13 0802) BP: (102-145)/(45-74) 141/59 mmHg (06/13 0802) SpO2:  [88 %-100 %] 96 % (06/13 0802)  Intake/Output from previous day: 06/12 0701 - 06/13 0700 In: 331 [P.O.:240; I.V.:91] Out: 2050 [Urine:2050]  Physical Exam: General appearance: alert, cooperative, appears older than stated age and morbidly obese Lungs: Prolonged expiration bilateral, mild expiratory wheeze present left base worse than the right. Heart: Regular  rhythm, S1, S2: normal, no rub and no murmur Abdomen: soft, non-tender; bowel sounds normal; no masses, no organomegaly and obese with pannus (small) present Extremities: edema 2 plus bilateral below knee pitting Pulses: Bilateral carotids normal without bruit, femoral pulses faint due to obesity, popliteal pulse could not be felt due to obesity and pain from arthritis. DP 2+ bilateral, PT could not be felt, significant edema present. Neurologic: Grossly normal Lab Results: BMP  Recent Labs  09/29/15 0351 09/30/15 0418 10/02/15 0306  NA 139 139 137  K 4.2 4.0 3.9  CL 100* 99* 89*  CO2 32 31 41*  GLUCOSE 150* 102* 152*  BUN 20 20 18   CREATININE 1.00 1.06* 1.00  CALCIUM 8.8* 9.1 9.3  GFRNONAA 56* 52* 56*  GFRAA >60 >60 >60    CBC  Recent Labs Lab 10/02/15 0306  WBC 12.5*  RBC 3.85*  HGB 9.8*  HCT 33.3*  PLT 413*  MCV 86.5  MCH 25.5*  MCHC 29.4*  RDW 15.5   TSH Normal at 0.613  HEMOGLOBIN A1C Lab Results  Component Value Date   HGBA1C 7.7* 09/25/2015   MPG 174 09/25/2015  Cardiac Studies: EKG: 09/28/2015: atrial fibrillation, rate Uncontrolled. Nonspecific T abnormality. EKG 09/30/2015: Normal sinus rhythm.  Echo: 02/08/2015: Left  ventricle: The cavity size was normal. Systolic function was normal. The estimated ejection fraction was in the range of 55% to 60%. Wall motion was normal; there were no regional wall  motion abnormalities. - Left atrium: The atrium was mildly dilated.  Cardioversion 09/29/2015: Atrial fibrillation to normal sinus rhythm with 120 J.  Scheduled Meds: . atenolol  25 mg Oral Daily  . diltiazem  120 mg Oral BID  . ferrous sulfate  325 mg Oral Q breakfast  . furosemide  20 mg Oral Daily  . insulin aspart  0-15 Units Subcutaneous Q4H  . insulin glargine  5 Units Subcutaneous Daily  . levalbuterol  0.63 mg Nebulization TID  . mometasone-formoterol  2 puff Inhalation BID  . multivitamin with minerals  1 tablet Oral Daily  . pantoprazole  40 mg Oral Daily  . predniSONE  20 mg Oral Q breakfast  . sodium chloride flush  3 mL Intravenous Q12H  . tiotropium  18 mcg Inhalation Daily   Continuous Infusions: . sodium chloride 250 mL (10/01/15 1300)  . heparin 1,500 Units/hr (10/02/15 0240)   PRN Meds:.ipratropium-albuterol, LORazepam, meperidine (DEMEROL) injection, ondansetron (ZOFRAN) IV, sodium chloride flush, traZODone   Assessment/Plan:  1. New onset atrial fibrillation with rapid ventricle response. Extensive review of chart also appears to have had A. fib sometime in 2009. Patient has chronic palpitations. Successful cardioversion 09/29/2015. Maintain sinus rhythm. CHA2DS2-VASCScore: Risk Score 4(without stroke as it was related to aneurysm), Yearly risk of stroke 4%. 2. Hypertension 3.  Hyperlipidemia 4. Diabetes mellitus type 2 uncontrolled without use of insulin 5. Morbid obesity 6. COPD with acute exacerbation, history of 40-pack-year history of smoking, quit 2006 7. History of cerebral aneurysm S/P coiling with small neck and small residual aneurysm present by cerebral angio 10/01/2015. No contraindication for anticoagulation. 8. Chronic iron deficiency anemia, patient has had  extensive evaluation in the past, no obvious source has been found. She has had multiple GI evaluations in the past, follows [facility].  Recommendation: Patient needs to be on anticoagulation due to high risk for cardioembolic phenomena. Patient can be started on Xarelto 20 mg by mouth after dinner daily with close monitoring of her hemoglobin.   [name], M.D. 10/02/2015, 8:24 AM [facility] Pager: [phone] Office: [phone] If no answer: [phone]

## 2015-11-16 ENCOUNTER — Institutional Professional Consult (permissible substitution): Payer: Medicare Other | Admitting: Internal Medicine

## 2016-01-06 ENCOUNTER — Inpatient Hospital Stay (HOSPITAL_COMMUNITY): Payer: Medicare Other

## 2016-01-06 ENCOUNTER — Emergency Department (HOSPITAL_COMMUNITY): Payer: Medicare Other

## 2016-01-06 ENCOUNTER — Inpatient Hospital Stay (HOSPITAL_COMMUNITY)
Admission: EM | Admit: 2016-01-06 | Discharge: 2016-01-14 | DRG: 208 | Disposition: A | Discharge status: Home Health | Payer: Medicare Other | Attending: Family Medicine | Admitting: Family Medicine

## 2016-01-06 ENCOUNTER — Encounter (HOSPITAL_COMMUNITY): Payer: Self-pay | Admitting: Emergency Medicine

## 2016-01-06 DIAGNOSIS — E8770 Fluid overload, unspecified: Secondary | ICD-10-CM | POA: Diagnosis present

## 2016-01-06 DIAGNOSIS — Z79899 Other long term (current) drug therapy: Secondary | ICD-10-CM | POA: Diagnosis not present

## 2016-01-06 DIAGNOSIS — I4891 Unspecified atrial fibrillation: Secondary | ICD-10-CM | POA: Diagnosis present

## 2016-01-06 DIAGNOSIS — Z9981 Dependence on supplemental oxygen: Secondary | ICD-10-CM | POA: Diagnosis not present

## 2016-01-06 DIAGNOSIS — F329 Major depressive disorder, single episode, unspecified: Secondary | ICD-10-CM | POA: Diagnosis present

## 2016-01-06 DIAGNOSIS — E876 Hypokalemia: Secondary | ICD-10-CM | POA: Diagnosis not present

## 2016-01-06 DIAGNOSIS — Z888 Allergy status to other drugs, medicaments and biological substances status: Secondary | ICD-10-CM

## 2016-01-06 DIAGNOSIS — Z9011 Acquired absence of right breast and nipple: Secondary | ICD-10-CM

## 2016-01-06 DIAGNOSIS — R778 Other specified abnormalities of plasma proteins: Secondary | ICD-10-CM

## 2016-01-06 DIAGNOSIS — Z7951 Long term (current) use of inhaled steroids: Secondary | ICD-10-CM

## 2016-01-06 DIAGNOSIS — J9622 Acute and chronic respiratory failure with hypercapnia: Secondary | ICD-10-CM

## 2016-01-06 DIAGNOSIS — J440 Chronic obstructive pulmonary disease with acute lower respiratory infection: Secondary | ICD-10-CM | POA: Diagnosis present

## 2016-01-06 DIAGNOSIS — I482 Chronic atrial fibrillation, unspecified: Secondary | ICD-10-CM

## 2016-01-06 DIAGNOSIS — Z87891 Personal history of nicotine dependence: Secondary | ICD-10-CM | POA: Diagnosis not present

## 2016-01-06 DIAGNOSIS — J9621 Acute and chronic respiratory failure with hypoxia: Secondary | ICD-10-CM | POA: Diagnosis present

## 2016-01-06 DIAGNOSIS — R197 Diarrhea, unspecified: Secondary | ICD-10-CM

## 2016-01-06 DIAGNOSIS — Z794 Long term (current) use of insulin: Secondary | ICD-10-CM

## 2016-01-06 DIAGNOSIS — Z4659 Encounter for fitting and adjustment of other gastrointestinal appliance and device: Secondary | ICD-10-CM

## 2016-01-06 DIAGNOSIS — E669 Obesity, unspecified: Secondary | ICD-10-CM | POA: Diagnosis present

## 2016-01-06 DIAGNOSIS — R7989 Other specified abnormal findings of blood chemistry: Secondary | ICD-10-CM | POA: Diagnosis not present

## 2016-01-06 DIAGNOSIS — Z91048 Other nonmedicinal substance allergy status: Secondary | ICD-10-CM

## 2016-01-06 DIAGNOSIS — R262 Difficulty in walking, not elsewhere classified: Secondary | ICD-10-CM

## 2016-01-06 DIAGNOSIS — R1310 Dysphagia, unspecified: Secondary | ICD-10-CM | POA: Diagnosis present

## 2016-01-06 DIAGNOSIS — Z66 Do not resuscitate: Secondary | ICD-10-CM | POA: Diagnosis present

## 2016-01-06 DIAGNOSIS — I481 Persistent atrial fibrillation: Secondary | ICD-10-CM | POA: Diagnosis not present

## 2016-01-06 DIAGNOSIS — J441 Chronic obstructive pulmonary disease with (acute) exacerbation: Secondary | ICD-10-CM | POA: Diagnosis present

## 2016-01-06 DIAGNOSIS — G4730 Sleep apnea, unspecified: Secondary | ICD-10-CM | POA: Diagnosis present

## 2016-01-06 DIAGNOSIS — Z7983 Long term (current) use of bisphosphonates: Secondary | ICD-10-CM

## 2016-01-06 DIAGNOSIS — N183 Chronic kidney disease, stage 3 (moderate): Secondary | ICD-10-CM | POA: Diagnosis present

## 2016-01-06 DIAGNOSIS — D649 Anemia, unspecified: Secondary | ICD-10-CM

## 2016-01-06 DIAGNOSIS — Z23 Encounter for immunization: Secondary | ICD-10-CM | POA: Diagnosis not present

## 2016-01-06 DIAGNOSIS — E119 Type 2 diabetes mellitus without complications: Secondary | ICD-10-CM

## 2016-01-06 DIAGNOSIS — E1122 Type 2 diabetes mellitus with diabetic chronic kidney disease: Secondary | ICD-10-CM | POA: Diagnosis present

## 2016-01-06 DIAGNOSIS — M069 Rheumatoid arthritis, unspecified: Secondary | ICD-10-CM | POA: Diagnosis present

## 2016-01-06 DIAGNOSIS — N179 Acute kidney failure, unspecified: Secondary | ICD-10-CM | POA: Diagnosis present

## 2016-01-06 DIAGNOSIS — I129 Hypertensive chronic kidney disease with stage 1 through stage 4 chronic kidney disease, or unspecified chronic kidney disease: Secondary | ICD-10-CM | POA: Diagnosis present

## 2016-01-06 DIAGNOSIS — J189 Pneumonia, unspecified organism: Secondary | ICD-10-CM | POA: Diagnosis present

## 2016-01-06 DIAGNOSIS — Z7901 Long term (current) use of anticoagulants: Secondary | ICD-10-CM

## 2016-01-06 DIAGNOSIS — R5381 Other malaise: Secondary | ICD-10-CM | POA: Diagnosis not present

## 2016-01-06 DIAGNOSIS — K219 Gastro-esophageal reflux disease without esophagitis: Secondary | ICD-10-CM | POA: Diagnosis present

## 2016-01-06 DIAGNOSIS — M6281 Muscle weakness (generalized): Secondary | ICD-10-CM

## 2016-01-06 DIAGNOSIS — J810 Acute pulmonary edema: Secondary | ICD-10-CM | POA: Diagnosis present

## 2016-01-06 DIAGNOSIS — Z6841 Body Mass Index (BMI) 40.0 and over, adult: Secondary | ICD-10-CM

## 2016-01-06 DIAGNOSIS — J9601 Acute respiratory failure with hypoxia: Secondary | ICD-10-CM

## 2016-01-06 DIAGNOSIS — D638 Anemia in other chronic diseases classified elsewhere: Secondary | ICD-10-CM | POA: Diagnosis present

## 2016-01-06 DIAGNOSIS — Z9289 Personal history of other medical treatment: Secondary | ICD-10-CM

## 2016-01-06 LAB — URINALYSIS, ROUTINE W REFLEX MICROSCOPIC
BILIRUBIN URINE: NEGATIVE
GLUCOSE, UA: 250 mg/dL — AB
HGB URINE DIPSTICK: NEGATIVE
KETONES UR: NEGATIVE mg/dL
Leukocytes, UA: NEGATIVE
Nitrite: NEGATIVE
PROTEIN: NEGATIVE mg/dL
Specific Gravity, Urine: 1.013 (ref 1.005–1.030)
pH: 5 (ref 5.0–8.0)

## 2016-01-06 LAB — CBC WITH DIFFERENTIAL/PLATELET
BASOS PCT: 0 %
Basophils Absolute: 0 10*3/uL (ref 0.0–0.1)
EOS ABS: 0 10*3/uL (ref 0.0–0.7)
EOS PCT: 0 %
HEMATOCRIT: 37.6 % (ref 36.0–46.0)
HEMOGLOBIN: 10.8 g/dL — AB (ref 12.0–15.0)
LYMPHS PCT: 4 %
Lymphs Abs: 1.3 10*3/uL (ref 0.7–4.0)
MCH: 26.7 pg (ref 26.0–34.0)
MCHC: 28.7 g/dL — ABNORMAL LOW (ref 30.0–36.0)
MCV: 92.8 fL (ref 78.0–100.0)
Monocytes Absolute: 2.3 10*3/uL — ABNORMAL HIGH (ref 0.1–1.0)
Monocytes Relative: 7 %
NEUTROS ABS: 28.8 10*3/uL — AB (ref 1.7–7.7)
Neutrophils Relative %: 89 %
PLATELETS: 389 10*3/uL (ref 150–400)
RBC: 4.05 MIL/uL (ref 3.87–5.11)
RDW: 15.5 % (ref 11.5–15.5)
WBC: 32.4 10*3/uL — ABNORMAL HIGH (ref 4.0–10.5)

## 2016-01-06 LAB — GLUCOSE, CAPILLARY
GLUCOSE-CAPILLARY: 369 mg/dL — AB (ref 65–99)
Glucose-Capillary: 306 mg/dL — ABNORMAL HIGH (ref 65–99)
Glucose-Capillary: 316 mg/dL — ABNORMAL HIGH (ref 65–99)
Glucose-Capillary: 320 mg/dL — ABNORMAL HIGH (ref 65–99)
Glucose-Capillary: 323 mg/dL — ABNORMAL HIGH (ref 65–99)
Glucose-Capillary: 326 mg/dL — ABNORMAL HIGH (ref 65–99)
Glucose-Capillary: 349 mg/dL — ABNORMAL HIGH (ref 65–99)
Glucose-Capillary: 350 mg/dL — ABNORMAL HIGH (ref 65–99)

## 2016-01-06 LAB — BLOOD GAS, ARTERIAL
Acid-Base Excess: 15.7 mmol/L — ABNORMAL HIGH (ref 0.0–2.0)
Bicarbonate: 43.9 mmol/L — ABNORMAL HIGH (ref 20.0–28.0)
Delivery systems: POSITIVE
Drawn by: 28340
Expiratory PAP: 8
FIO2: 80
Inspiratory PAP: 18
O2 Saturation: 98.7 %
Patient temperature: 98.6
RATE: 10 {breaths}/min
pCO2 arterial: 113 mmHg (ref 32.0–48.0)
pH, Arterial: 7.213 — ABNORMAL LOW (ref 7.350–7.450)
pO2, Arterial: 147 mmHg — ABNORMAL HIGH (ref 83.0–108.0)

## 2016-01-06 LAB — MRSA PCR SCREENING: MRSA by PCR: POSITIVE — AB

## 2016-01-06 LAB — COMPREHENSIVE METABOLIC PANEL
ALK PHOS: 62 U/L (ref 38–126)
ALT: 13 U/L — ABNORMAL LOW (ref 14–54)
ANION GAP: 12 (ref 5–15)
AST: 15 U/L (ref 15–41)
Albumin: 2.9 g/dL — ABNORMAL LOW (ref 3.5–5.0)
BILIRUBIN TOTAL: 0.4 mg/dL (ref 0.3–1.2)
BUN: 16 mg/dL (ref 6–20)
CO2: 41 mmol/L — ABNORMAL HIGH (ref 22–32)
CREATININE: 1.22 mg/dL — AB (ref 0.44–1.00)
Calcium: 9.3 mg/dL (ref 8.9–10.3)
Chloride: 83 mmol/L — ABNORMAL LOW (ref 101–111)
GFR, EST AFRICAN AMERICAN: 51 mL/min — AB (ref >60)
GFR, EST NON AFRICAN AMERICAN: 44 mL/min — AB (ref >60)
Glucose, Bld: 231 mg/dL — ABNORMAL HIGH (ref 65–99)
Potassium: 4.2 mmol/L (ref 3.5–5.1)
SODIUM: 136 mmol/L (ref 135–145)
Total Protein: 7 g/dL (ref 6.5–8.1)

## 2016-01-06 LAB — BRAIN NATRIURETIC PEPTIDE: B Natriuretic Peptide: 192.3 pg/mL — ABNORMAL HIGH (ref 0.0–100.0)

## 2016-01-06 LAB — STREP PNEUMONIAE URINARY ANTIGEN: Strep Pneumo Urinary Antigen: NEGATIVE

## 2016-01-06 LAB — I-STAT ARTERIAL BLOOD GAS, ED
Acid-Base Excess: 19 mmol/L — ABNORMAL HIGH (ref 0.0–2.0)
Bicarbonate: 47.4 mmol/L — ABNORMAL HIGH (ref 20.0–28.0)
O2 Saturation: 95 %
PCO2 ART: 77.9 mmHg — AB (ref 32.0–48.0)
PH ART: 7.392 (ref 7.350–7.450)
TCO2: 50 mmol/L (ref 0–100)
pO2, Arterial: 84 mmHg (ref 83.0–108.0)

## 2016-01-06 LAB — TROPONIN I
TROPONIN I: 0.08 ng/mL — AB (ref <0.03)
Troponin I: 0.13 ng/mL
Troponin I: 0.03 ng/mL
Troponin I: <0.03 ng/mL

## 2016-01-06 LAB — HEPARIN LEVEL (UNFRACTIONATED): HEPARIN UNFRACTIONATED: 0.96 [IU]/mL — AB (ref 0.30–0.70)

## 2016-01-06 LAB — APTT
APTT: 42 s — AB (ref 24–36)
aPTT: 32 s (ref 24–36)

## 2016-01-06 LAB — LACTIC ACID, PLASMA: Lactic Acid, Venous: 1.8 mmol/L (ref 0.5–1.9)

## 2016-01-06 LAB — PROCALCITONIN: Procalcitonin: 1.43 ng/mL

## 2016-01-06 LAB — MAGNESIUM: Magnesium: 1.2 mg/dL — ABNORMAL LOW (ref 1.7–2.4)

## 2016-01-06 LAB — TRIGLYCERIDES: Triglycerides: 76 mg/dL (ref <150)

## 2016-01-06 LAB — PHOSPHORUS: Phosphorus: 4.1 mg/dL (ref 2.5–4.6)

## 2016-01-06 MED ORDER — SODIUM CHLORIDE 0.9% FLUSH
3.0000 mL | Freq: Two times a day (BID) | INTRAVENOUS | Status: DC
  Administered 2016-01-07 – 2016-01-10 (×4): 3 mL via INTRAVENOUS

## 2016-01-06 MED ORDER — DEXTROSE 5 % IV SOLN
1.0000 g | Freq: Three times a day (TID) | INTRAVENOUS | Status: DC
  Administered 2016-01-06 – 2016-01-07 (×3): 1 g via INTRAVENOUS
  Filled 2016-01-06 (×4): qty 1

## 2016-01-06 MED ORDER — AZITHROMYCIN 500 MG IV SOLR
500.0000 mg | Freq: Once | INTRAVENOUS | Status: AC
Start: 2016-01-06 — End: 2016-01-06
  Administered 2016-01-06: 500 mg via INTRAVENOUS
  Filled 2016-01-06: qty 500

## 2016-01-06 MED ORDER — INSULIN ASPART 100 UNIT/ML ~~LOC~~ SOLN
0.0000 [IU] | SUBCUTANEOUS | Status: DC
  Administered 2016-01-06: 15 [IU] via SUBCUTANEOUS
  Administered 2016-01-06: 20 [IU] via SUBCUTANEOUS
  Administered 2016-01-06: 15 [IU] via SUBCUTANEOUS

## 2016-01-06 MED ORDER — AMIODARONE HCL IN DEXTROSE 360-4.14 MG/200ML-% IV SOLN
30.0000 mg/h | INTRAVENOUS | Status: DC
  Administered 2016-01-07 – 2016-01-08 (×4): 30 mg/h via INTRAVENOUS
  Filled 2016-01-06 (×6): qty 200

## 2016-01-06 MED ORDER — SODIUM CHLORIDE 0.9 % IV SOLN: 250.0000 mL | INTRAVENOUS | Status: DC | PRN

## 2016-01-06 MED ORDER — FENTANYL CITRATE (PF) 100 MCG/2ML IJ SOLN
50.0000 ug | INTRAMUSCULAR | Status: DC | PRN
Start: 2016-01-06 — End: 2016-01-09
  Administered 2016-01-06 – 2016-01-07 (×3): 50 ug via INTRAVENOUS
  Filled 2016-01-06: qty 2

## 2016-01-06 MED ORDER — AMIODARONE LOAD VIA INFUSION
150.0000 mg | Freq: Once | INTRAVENOUS | Status: AC
  Administered 2016-01-06: 150 mg via INTRAVENOUS
  Filled 2016-01-06: qty 83.34

## 2016-01-06 MED ORDER — ACETAMINOPHEN 325 MG PO TABS: 650.0000 mg | ORAL_TABLET | ORAL | Status: DC | PRN

## 2016-01-06 MED ORDER — FENTANYL CITRATE (PF) 100 MCG/2ML IJ SOLN
50.0000 ug | INTRAMUSCULAR | Status: DC | PRN
  Administered 2016-01-07: 50 ug via INTRAVENOUS
  Filled 2016-01-06 (×2): qty 2

## 2016-01-06 MED ORDER — DEXTROSE 5 % IV SOLN
1.0000 g | Freq: Once | INTRAVENOUS | Status: AC
  Administered 2016-01-06: 1 g via INTRAVENOUS
  Filled 2016-01-06: qty 10

## 2016-01-06 MED ORDER — HEPARIN (PORCINE) IN NACL 100-0.45 UNIT/ML-% IJ SOLN
1700.0000 [IU]/h | INTRAMUSCULAR | Status: DC
  Administered 2016-01-06: 1150 [IU]/h via INTRAVENOUS
  Administered 2016-01-07: 1400 [IU]/h via INTRAVENOUS
  Administered 2016-01-08 – 2016-01-09 (×3): 1700 [IU]/h via INTRAVENOUS
  Filled 2016-01-06 (×7): qty 250

## 2016-01-06 MED ORDER — CHLORHEXIDINE GLUCONATE 0.12% ORAL RINSE (MEDLINE KIT)
15.0000 mL | Freq: Two times a day (BID) | OROMUCOSAL | Status: DC
  Administered 2016-01-06 – 2016-01-09 (×7): 15 mL via OROMUCOSAL

## 2016-01-06 MED ORDER — PROPOFOL 10 MG/ML IV BOLUS
INTRAVENOUS | Status: AC | PRN
  Administered 2016-01-06: 2422 ug via INTRAVENOUS
  Administered 2016-01-06: 4844 ug via INTRAVENOUS

## 2016-01-06 MED ORDER — FUROSEMIDE 10 MG/ML IJ SOLN
40.0000 mg | Freq: Once | INTRAMUSCULAR | Status: AC
  Administered 2016-01-06: 40 mg via INTRAVENOUS
  Filled 2016-01-06: qty 4

## 2016-01-06 MED ORDER — SODIUM CHLORIDE 0.9 % IV SOLN
2000.0000 mg | Freq: Once | INTRAVENOUS | Status: AC
  Administered 2016-01-06: 2000 mg via INTRAVENOUS
  Filled 2016-01-06: qty 2000

## 2016-01-06 MED ORDER — KETAMINE HCL-SODIUM CHLORIDE 100-0.9 MG/10ML-% IV SOSY
PREFILLED_SYRINGE | INTRAVENOUS | Status: AC
  Filled 2016-01-06: qty 10

## 2016-01-06 MED ORDER — PROPOFOL 1000 MG/100ML IV EMUL
0.0000 ug/kg/min | INTRAVENOUS | Status: DC
  Administered 2016-01-06: 20 ug/kg/min via INTRAVENOUS
  Administered 2016-01-06: 30.003 ug/kg/min via INTRAVENOUS
  Administered 2016-01-06 (×2): 30 ug/kg/min via INTRAVENOUS
  Administered 2016-01-07: 45 ug/kg/min via INTRAVENOUS
  Administered 2016-01-07: 40 ug/kg/min via INTRAVENOUS
  Administered 2016-01-07: 20 ug/kg/min via INTRAVENOUS
  Administered 2016-01-07: 40 ug/kg/min via INTRAVENOUS
  Administered 2016-01-07: 30 ug/kg/min via INTRAVENOUS
  Administered 2016-01-08: 45 ug/kg/min via INTRAVENOUS
  Administered 2016-01-08: 40 ug/kg/min via INTRAVENOUS
  Administered 2016-01-08: 20 ug/kg/min via INTRAVENOUS
  Administered 2016-01-08 – 2016-01-09 (×2): 40 ug/kg/min via INTRAVENOUS
  Administered 2016-01-09: 35 ug/kg/min via INTRAVENOUS
  Filled 2016-01-06 (×16): qty 100

## 2016-01-06 MED ORDER — PROPOFOL 1000 MG/100ML IV EMUL
INTRAVENOUS | Status: AC
  Filled 2016-01-06: qty 100

## 2016-01-06 MED ORDER — FENTANYL CITRATE (PF) 100 MCG/2ML IJ SOLN
INTRAMUSCULAR | Status: AC
  Filled 2016-01-06: qty 2

## 2016-01-06 MED ORDER — DILTIAZEM HCL 25 MG/5ML IV SOLN
10.0000 mg | Freq: Once | INTRAVENOUS | Status: AC
  Administered 2016-01-06: 10 mg via INTRAVENOUS
  Filled 2016-01-06: qty 5

## 2016-01-06 MED ORDER — METOPROLOL TARTRATE 5 MG/5ML IV SOLN
2.5000 mg | INTRAVENOUS | Status: DC | PRN
  Administered 2016-01-06 – 2016-01-07 (×5): 5 mg via INTRAVENOUS
  Filled 2016-01-06 (×5): qty 5

## 2016-01-06 MED ORDER — ORAL CARE MOUTH RINSE
15.0000 mL | Freq: Four times a day (QID) | OROMUCOSAL | Status: DC
  Administered 2016-01-06 – 2016-01-09 (×11): 15 mL via OROMUCOSAL

## 2016-01-06 MED ORDER — KETAMINE HCL 10 MG/ML IJ SOLN
INTRAMUSCULAR | Status: AC | PRN
  Administered 2016-01-06: 100 mg via INTRAVENOUS

## 2016-01-06 MED ORDER — IPRATROPIUM-ALBUTEROL 0.5-2.5 (3) MG/3ML IN SOLN
3.0000 mL | Freq: Four times a day (QID) | RESPIRATORY_TRACT | Status: DC
  Administered 2016-01-06 – 2016-01-10 (×16): 3 mL via RESPIRATORY_TRACT
  Filled 2016-01-06 (×15): qty 3

## 2016-01-06 MED ORDER — VANCOMYCIN HCL IN DEXTROSE 1-5 GM/200ML-% IV SOLN
1000.0000 mg | Freq: Two times a day (BID) | INTRAVENOUS | Status: DC
  Administered 2016-01-06: 1000 mg via INTRAVENOUS
  Filled 2016-01-06 (×2): qty 200

## 2016-01-06 MED ORDER — ASPIRIN 81 MG PO CHEW: 324.0000 mg | CHEWABLE_TABLET | ORAL | Status: AC

## 2016-01-06 MED ORDER — MAGNESIUM SULFATE 2 GM/50ML IV SOLN
2.0000 g | Freq: Once | INTRAVENOUS | Status: AC
  Administered 2016-01-06: 2 g via INTRAVENOUS
  Filled 2016-01-06: qty 50

## 2016-01-06 MED ORDER — LORAZEPAM 2 MG/ML IJ SOLN
INTRAMUSCULAR | Status: AC
  Filled 2016-01-06: qty 1

## 2016-01-06 MED ORDER — ASPIRIN 300 MG RE SUPP
300.0000 mg | RECTAL | Status: AC
  Administered 2016-01-06: 300 mg via RECTAL
  Filled 2016-01-06: qty 1

## 2016-01-06 MED ORDER — LORAZEPAM 2 MG/ML IJ SOLN
2.0000 mg | Freq: Once | INTRAMUSCULAR | Status: AC
  Administered 2016-01-06: 2 mg via INTRAVENOUS

## 2016-01-06 MED ORDER — PANTOPRAZOLE SODIUM 40 MG PO PACK
40.0000 mg | PACK | Freq: Every day | ORAL | Status: DC
  Administered 2016-01-06 – 2016-01-08 (×3): 40 mg
  Filled 2016-01-06 (×5): qty 20

## 2016-01-06 MED ORDER — SODIUM CHLORIDE 0.9% FLUSH: 3.0000 mL | INTRAVENOUS | Status: DC | PRN

## 2016-01-06 MED ORDER — SODIUM CHLORIDE 0.9 % IV SOLN
INTRAVENOUS | Status: AC
  Administered 2016-01-06: 2.6 [IU]/h via INTRAVENOUS
  Filled 2016-01-06 (×2): qty 2.5

## 2016-01-06 MED ORDER — ONDANSETRON HCL 4 MG/2ML IJ SOLN: 4.0000 mg | Freq: Four times a day (QID) | INTRAMUSCULAR | Status: DC | PRN

## 2016-01-06 MED ORDER — DILTIAZEM HCL 100 MG IV SOLR
5.0000 mg/h | INTRAVENOUS | Status: DC
  Administered 2016-01-06: 15 mg/h via INTRAVENOUS
  Administered 2016-01-06: 5 mg/h via INTRAVENOUS
  Filled 2016-01-06 (×2): qty 100

## 2016-01-06 MED ORDER — DOCUSATE SODIUM 50 MG/5ML PO LIQD: 100.0000 mg | Freq: Two times a day (BID) | ORAL | Status: DC | PRN

## 2016-01-06 MED ORDER — BISACODYL 10 MG RE SUPP
10.0000 mg | Freq: Every day | RECTAL | Status: DC | PRN
Start: 2016-01-06 — End: 2016-01-09

## 2016-01-06 MED ORDER — AMIODARONE HCL IN DEXTROSE 360-4.14 MG/200ML-% IV SOLN
60.0000 mg/h | INTRAVENOUS | Status: AC
  Administered 2016-01-06 (×2): 60 mg/h via INTRAVENOUS
  Filled 2016-01-06 (×3): qty 200

## 2016-01-06 MED ORDER — METHYLPREDNISOLONE SODIUM SUCC 40 MG IJ SOLR
40.0000 mg | Freq: Two times a day (BID) | INTRAMUSCULAR | Status: DC
  Administered 2016-01-06 – 2016-01-08 (×6): 40 mg via INTRAVENOUS
  Filled 2016-01-06 (×8): qty 1

## 2016-01-06 MED ORDER — SUCCINYLCHOLINE CHLORIDE 20 MG/ML IJ SOLN
INTRAMUSCULAR | Status: AC | PRN
  Administered 2016-01-06: 150 mg via INTRAVENOUS

## 2016-01-06 NOTE — ED Triage Notes (Signed)
Pt brought to ED by [facility] from home for respiratory distress, pt found unresponsive by sister and called EMS, pt found very lethargic, and cyanotic on EMS arrival, CHF, afib on blood thinner no complaint lately with medication due to depression. Home 4L O2 dependent with SPO2 63% on [place]. 125 Solumedrol IV given by EMS and neb treatment PTA. BP 134/74, HR 120-130, R-29, SPO2 92% on CPAP

## 2016-01-06 NOTE — Progress Notes (Signed)
eLink Physician-Brief Progress Note Patient Name: [name] DOB: [birth year] MRN: [id number]   Date of Service  01/06/2016  HPI/Events of Note  Persistent afib with RVR despite cardizem drip to 15 mg/hr.  HR now 160's.  Systolic bp remains greater than 100.  eICU Interventions  Amiodarone load and drip     Intervention Category Intermediate Interventions: Arrhythmia - evaluation and management  Henry Russel, P 01/06/2016, 8:29 PM

## 2016-01-06 NOTE — H&P (Signed)
PULMONARY / CRITICAL CARE MEDICINE   Name: [name] MRN: [id number] DOB: 1945-08-30    ADMISSION DATE:  01/06/2016 CONSULTATION DATE:  01/06/16  REFERRING MD:  EDP , Dr. Preston Fleeting   CHIEF COMPLAINT:  Resp Failure   HISTORY OF PRESENT ILLNESS:   70 yo female former smoker with known COPD and chronic resp failure on home O2 at 3-4 l./m with multiple comorbidities with DM, chronic anemia , chronic renal disease-stage III , cerebral aneurysm s/p coiling in 2009 , Afib with RVR 09/2015 s/p cardioversion and begin anticoagulation (xarelto) found lethargic at home by family. EMS brought to ER at [facility] found to be hypoxic and hypercarbic . Placed on BIPAP without sigificant improvement requiring intubation and vent support. ABG showed severe hypercarbia with PCO2 >100. Most recent admission was in June 2017 with similar presentation. Discharged to SNF and then to home. Dr. Tyson Alias conversation with family . Pt is a DNR .    PAST MEDICAL HISTORY :  She  has a past medical history of Acute respiratory failure (HCC) (02/05/2017); Adrenal disorder, other; Anemia; Aneurysm (HCC); Arthritis; Asthma; Atrial fibrillation (HCC); Brain aneurysm; Bronchitis; Cerebral aneurysm; Complication of anesthesia; COPD (chronic obstructive pulmonary disease) (HCC); Depression; Diabetes mellitus; Emphysema of lung (HCC); GERD (gastroesophageal reflux disease); Hypertension; Obesity; Rheumatoid arthritis(714.0); Shortness of breath; Sleep apnea; and Urinary tract infection.  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  Allergies  Allergen Reactions  . Gold-Containing Drug Products Other (See Comments)    Blisters   . Tape Dermatitis    No current facility-administered medications on file prior to encounter.    Current Outpatient  Prescriptions on File Prior to Encounter  Medication Sig  . acetaminophen (TYLENOL) 325 MG tablet Take 1 tablet (325 mg total) by mouth every 4 (four) hours as needed for mild pain, moderate pain, fever or headache.  . albuterol (PROAIR HFA) 108 (90 BASE) MCG/ACT inhaler Inhale 2 puffs into the lungs every 6 (six) hours as needed. For shortness of breath  . atenolol (TENORMIN) 25 MG tablet Take 25 mg by mouth daily.  . budesonide-formoterol (SYMBICORT) 80-4.5 MCG/ACT inhaler Inhale 2 puffs into the lungs 2 (two) times daily. Reported on 09/25/2015  . Calcium 600-200 MG-UNIT tablet Take 1 tablet by mouth daily.  Marland Kitchen diltiazem (CARDIZEM CD) 120 MG 24 hr capsule Take 1 capsule (120 mg total) by mouth 2 (two) times daily.  . ferrous sulfate 325 (65 FE) MG tablet Take 325 mg by mouth daily with breakfast.  . furosemide (LASIX) 20 MG tablet Take 20 mg by mouth daily.  Marland Kitchen ibandronate (BONIVA) 150 MG tablet Take 150 mg by mouth every 30 (thirty) days. Take in the morning with a full glass of water, on an empty stomach, and do not take anything else by mouth or lie down for the next 30 min.  . insulin aspart (NOVOLOG) 100 UNIT/ML injection Inject 0-15 Units into the skin every 4 (four) hours.  . insulin glargine (LANTUS) 100 UNIT/ML injection Inject 0.05 mLs (5 Units total) into the skin daily.  Marland Kitchen ipratropium-albuterol (DUONEB) 0.5-2.5 (3) MG/3ML SOLN Take 3 mLs by nebulization 3 (three) times daily.  Marland Kitchen LORazepam (ATIVAN) 0.5 MG tablet Take 1 tablet (0.5 mg total) by mouth every 8 (eight) hours as needed for anxiety.  . Multiple Vitamins-Minerals (CENTRUM SILVER ADULT 50+ PO) Take 1 tablet by mouth  daily.  . oxybutynin (DITROPAN) 5 MG tablet Take 0.5 tablets (2.5 mg total) by mouth 2 (two) times daily.  . OXYGEN Inhale 2.5 L into the lungs continuous.  . pantoprazole (PROTONIX) 40 MG tablet Take 1 tablet (40 mg total) by mouth daily.  . polyethylene glycol (MIRALAX / GLYCOLAX) packet Take 17 g by mouth daily.   . predniSONE (DELTASONE) 10 MG tablet Please take   20 mg daily for 2 days, then 10 mg daily for 2 days, then stop.  . rivaroxaban (XARELTO) 20 MG TABS tablet Take 1 tablet (20 mg total) by mouth daily with supper. Start tomorrow on 6/14, already received dose during hospital stay today  . senna-docusate (SENOKOT-S) 8.6-50 MG tablet Take 2 tablets by mouth at bedtime.  Marland Kitchen tiotropium (SPIRIVA) 18 MCG inhalation capsule Place 18 mcg into inhaler and inhale daily.   . traZODone (DESYREL) 50 MG tablet Take 50 mg by mouth at bedtime.    FAMILY HISTORY:  Her indicated that her mother is deceased. She indicated that her father is deceased. She indicated that the status of her sister is unknown.    SOCIAL HISTORY: She  reports that she has quit smoking. Her smoking use included Cigarettes. She has a 60.00 pack-year smoking history. She has never used smokeless tobacco. She reports that she drinks about 0.6 oz of alcohol per week . She reports that she does not use drugs.  REVIEW OF SYSTEMS:   Unable to obtain as pt is on vent /sedated   SUBJECTIVE:  Sedated on vent  Admit for Vent Depend RF   VITAL SIGNS: BP 129/66 (BP Location: Left Arm)   Pulse 119   Temp 97.9 F (36.6 C) (Axillary)   Resp 21   Ht 5\' 4"  (1.626 m)   Wt 121.1 kg (267 lb)   SpO2 97%   BMI 45.83 kg/m   HEMODYNAMICS:    VENTILATOR SETTINGS: Vent Mode: BIPAP;PCV FiO2 (%):  [50 %-100 %] 50 % Set Rate:  [10 bmp] 10 bmp PEEP:  [6 cmH20-8 cmH20] 6 cmH20  INTAKE / OUTPUT: No intake/output data recorded.  PHYSICAL EXAMINATION: General: sedated on vent  Neuro: sedated, grimmaces to pain  HEENT:  ETT  Cardiovascular:  ST , no m/r/g  Lungs: coarse BS , diminished in bases  Abdomen:  Obese , hypoactive BS  Musculoskeletal:  Intact  Skin:  Intact , no rash   LABS:  BMET  Recent Labs Lab 01/06/16 0538  NA 136  K 4.2  CL 83*  CO2 41*  BUN 16  CREATININE 1.22*  GLUCOSE 231*    Electrolytes  Recent  Labs Lab 01/06/16 0538  CALCIUM 9.3    CBC  Recent Labs Lab 01/06/16 0538  WBC 32.4*  HGB 10.8*  HCT 37.6  PLT 389    Coag's No results for input(s): APTT, INR in the last 168 hours.  Sepsis Markers No results for input(s): LATICACIDVEN, PROCALCITON, O2SATVEN in the last 168 hours.  ABG  Recent Labs Lab 01/06/16 0535  PHART 7.213*  PCO2ART 113*  PO2ART 147*    Liver Enzymes  Recent Labs Lab 01/06/16 0538  AST 15  ALT 13*  ALKPHOS 62  BILITOT 0.4  ALBUMIN 2.9*    Cardiac Enzymes  Recent Labs Lab 01/06/16 0538  TROPONINI 0.08*    Glucose No results for input(s): GLUCAP in the last 168 hours.  Imaging Dg Chest Portable 1 View  Result Date: 01/06/2016 CLINICAL DATA:  Patient with history of respiratory  distress. EXAM: PORTABLE CHEST 1 VIEW COMPARISON:  Chest radiograph 01/06/2016 FINDINGS: ET tube terminates in the mid trachea. Enteric tube courses inferior to the diaphragm. Monitoring leads overlie the patient. Stable cardiac and mediastinal contours. Bilateral mid lower lung perihilar interstitial pulmonary opacities, grossly unchanged. Apical emphysematous change. Biapical pleural parenchymal thickening. Old left rib fractures. IMPRESSION: Grossly unchanged mid and lower lung interstitial opacities which may represent atelectasis, pneumonia or edema. Electronically Signed   By: Annia Belt M.D.   On: 01/06/2016 07:47   Dg Chest Port 1 View  Result Date: 01/06/2016 CLINICAL DATA:  Dyspnea today.  On BiPAP. EXAM: PORTABLE CHEST 1 VIEW COMPARISON:  Radiographs 09/26/2015 FINDINGS: Lung bases excluded from the field of view. The lungs are hyperinflated with chronic bronchial thickening. Patchy opacities at both lung bases. Cardiomediastinal contours are unchanged. There is chronic biapical pleural parenchymal scarring. Remote left rib fractures. IMPRESSION: Patchy bibasilar opacities may be atelectasis or pneumonia, pulmonary edema is felt less likely.  Background emphysema with bronchial thickening and hyperinflation. Electronically Signed   By: Rubye Oaks M.D.   On: 01/06/2016 06:06     STUDIES:    CULTURES: 9/17 BC x 2 >> 9/17 Sputum >>  ANTIBIOTICS: 9/17 Ceftaz>> 9/17 Vanc >>   SIGNIFICANT EVENTS: Admitted with hypercarbic/hypoxic RF on vent to ICU   LINES/TUBES: 9/17 ETT >>  DISCUSSION: 70 yo female with severe COPD on home O2 at 3-4l/m admitted with COPD exacerbation +HCAP w/ hypercarbic/hypoxic RF on vent .   ASSESSMENT / PLAN:  PULMONARY A: Acute on Chronic  Hypercarbic/Hypoxic Resp Failure +/- PNA/fluid overload requiring vent support  COPD exacerbation (home symbicort/spiriva) Home O2 at 4l/m  ?OSA/OHS  not on CPAP at home   P:   Cont full Vent support  Check abg in 1 hr  Check sputum cx  Lasix 40mg  x 1 in ER 9/17  VAP protocol  Check cxr /abg in am  Daily eval for wean/SBT  Check strep /leg urine ag  Solumedrol 40mg  q12  ABX per ID sxn   CARDIOVASCULAR A:  Tachycardia  HTN  Echo 01/2015 EF 55-60%, mild dilated LA  Atrial Fib (dx 09/2015 ) cardioversion 09/2015 , cerebral angiogram d/t prev aneurysm coliing 2009, no contraindication for anticoagualtion d/ch on xarelto  P:  Hold Xarelto  Hep drip  Hold home cardizem/atenolol for now , restart as b/p improves  Tr troponin  Metoprolol As needed    RENAL A:   Acute on chronic Kidney disease ( scr 09/2015 1.0 )  P:   Hold additional lasix at this time  Follow chem panel  Replace electrolytes as indicated    GASTROINTESTINAL A:   Nutrition  P:   PPI  NPO  Begin TF   HEMATOLOGIC A:   Anemia  P:  Monitor H/H on hep     INFECTIOUS A:   HCAP  P:   Ceftaz /[place] 1  Pan Culture  Check La/ PCT   ENDOCRINE A:   DM  P:   SSI   NEUROLOGIC A:   Sedated on Vent  Unresponsive  P:   RASS goal: 0 to -1  PAD protocol w/ propofol /fent  Avoid oversedation  Hold home trazodone    FAMILY  - Updates:   -  Inter-disciplinary family meet or Palliative Care meeting due by:  9/24   Rubye Oaks NP-C  Pulmonary and Critical Care Medicine [facility] Pager: [phone]  01/06/2016, 7:55 AM   STAFF NOTE: Leland Her  Tyson Alias, MD FACP have personally reviewed patient's available data, including medical history, events of note, physical examination and test results as part of my evaluation. I have discussed with resident/NP and other care providers such as pharmacist, RN and RRT. In addition, I personally evaluated patient and elicited key findings of: vent, sedated, ronchi bases, pcxr , wbc noted, c/w HCAP, after last hosp stay she als went to rehab, does NOT need atypical coverage, use hCAP coverage, dc azithro, follow pcxr, increase rate vent but with copd avoid high rates and autopeep, requires some solulmedral BDers, I have had extensive discussions with family sister. We discussed patients current circumstances and organ failures. We also discussed patient's prior wishes under circumstances such as this. Family has decided to NOT perform resuscitation if arrest but to continue current medical support for now. Use heparin for stroke prevention for now, I  udpated sister  The patient is critically ill with multiple organ systems failure and requires high complexity decision making for assessment and support, frequent evaluation and titration of therapies, application of advanced monitoring technologies and extensive interpretation of multiple databases.   Critical Care Time devoted to patient care services described in this note is 35 Minutes. This time reflects time of care of this signee: Rory Percy, MD FACP. This critical care time does not reflect procedure time, or teaching time or supervisory time of PA/NP/Med student/Med Resident etc but could involve care discussion time. Rest per NP/medical resident whose note is outlined above and that I agree with   Mcarthur Rossetti. Tyson Alias, MD,  FACP Pgr: [phone] [facility] 01/06/2016 11:02 AM

## 2016-01-06 NOTE — ED Notes (Signed)
Attempted report 

## 2016-01-06 NOTE — ED Provider Notes (Signed)
[facility]-EMERGENCY DEPT Provider Note   CSN: [id number] Arrival date & time:        History   Chief Complaint Chief Complaint  Patient presents with  . Respiratory Distress    HPI [name] is a 70 y.o. female.  The history is provided by the EMS personnel. The history is limited by the condition of the patient (Respiratory distress, poorly responsive).  She was brought in by EMS on CPAP. There've been called to the home for unresponsive patient and she was noted to be hypoxic and in respiratory distress. Oxygen saturation was in the 50s. On CPAP, oxygen saturation did come up onto the upper 80s/low 90s. They noticed some fluid on the left lung and poor air movement on the right. She received methylprednisolone and an albuterol with ipratropium nebulizer treatment with little change. According to family, patient had not been taking any of her medications for the last 2 days. She been using her home nebulizer throughout the day but had been refusing to come to the hospital.  Past Medical History:  Diagnosis Date  . Acute respiratory failure (HCC) 02/05/2017  . Adrenal disorder, other    "adrenal Incidentaloma"  . Anemia   . Aneurysm (HCC)    x2  . Arthritis   . Asthma   . Atrial fibrillation (HCC)   . Brain aneurysm    have a "giant aneurysm, Dr. Bedelia Personevenshwar has stented and coiled in past x 4  . Bronchitis    history of  . Cerebral aneurysm   . Complication of anesthesia    Has woken up during surgery before  . COPD (chronic obstructive pulmonary disease) (HCC)    Dr. Nehemiah SettlePolite, uses 2.5L of o2 as needed  . Depression    due to loss of spouse 6 years ago  . Diabetes mellitus    metformin  . Emphysema of lung (HCC)    requested anethesia consult   . GERD (gastroesophageal reflux disease)    omeprazole  . Hypertension   . Obesity   . Rheumatoid arthritis(714.0)    has had for approx. 40years, sees Dr. Nehemiah SettlePolite  . Shortness of breath   . Sleep apnea    does not use cpap    . Urinary tract infection    history of    Patient Active Problem List   Diagnosis Date Noted  . Atrial fibrillation (HCC)   . Acute bronchitis with COPD (HCC) 09/25/2015  . Acute hyponatremia 09/25/2015  . Increased anion gap metabolic acidosis 09/25/2015  . Leukocytosis 09/25/2015  . Sepsis (HCC) 09/25/2015  . Hypokalemia 09/25/2015  . COPD exacerbation (HCC)   . Fracture, ribs 02/06/2015  . Acute on chronic respiratory failure with hypoxia (HCC) 02/06/2015  . Obesity   . Iron deficiency anemia   . Hypertension   . Calcification of right breast 02/28/2011  . Family history of breast cancer 02/10/2011  . Diabetes mellitus type II, uncontrolled (HCC) 04/01/2007  . MITRAL VALVE PROLAPSE 04/01/2007  . ASTHMA 04/01/2007  . COPD (chronic obstructive pulmonary disease) (HCC) 04/01/2007  . POSTMENOPAUSAL SYNDROME 04/01/2007  . Rheumatoid arthritis (HCC) 04/01/2007  . VERTIGO 04/01/2007  . OSA (obstructive sleep apnea) 04/01/2007  . PALPITATIONS, HX OF 04/01/2007    Past Surgical History:  Procedure Laterality Date  . BRAIN SURGERY     coiling and stenting  . BREAST SURGERY  02/28/11   right partial mastectomy   . CARDIOVERSION N/A 09/29/2015   Procedure: CARDIOVERSION;  Surgeon: Yates DecampJay Ganji, MD;  Location: [facility] OR;  Service: Cardiovascular;  Laterality: N/A;  . HAND SURGERY     bilateral  . HAND SURGERY     x 2  . MASTECTOMY, PARTIAL  02/28/2011   Procedure: MASTECTOMY PARTIAL;  Surgeon: Ernestene Mention, MD;  Location: [facility] OR;  Service: General;  Laterality: Right;  RIGHT PARTIAL MASTECTOMY  WITH NEEDLE LOCALIZATION  . TUBAL LIGATION      OB History    No data available       Home Medications    Prior to Admission medications   Medication Sig Start Date End Date Taking? Authorizing Provider  acetaminophen (TYLENOL) 325 MG tablet Take 1 tablet (325 mg total) by mouth every 4 (four) hours as needed for mild pain, moderate pain, fever or headache. 09/28/15   Starleen Arms, MD  albuterol (PROAIR HFA) 108 (90 BASE) MCG/ACT inhaler Inhale 2 puffs into the lungs every 6 (six) hours as needed. For shortness of breath    Historical Provider, MD  atenolol (TENORMIN) 25 MG tablet Take 25 mg by mouth daily. 12/01/14   Historical Provider, MD  budesonide-formoterol (SYMBICORT) 80-4.5 MCG/ACT inhaler Inhale 2 puffs into the lungs 2 (two) times daily. Reported on 09/25/2015    Historical Provider, MD  Calcium 600-200 MG-UNIT tablet Take 1 tablet by mouth daily.    Historical Provider, MD  diltiazem (CARDIZEM CD) 120 MG 24 hr capsule Take 1 capsule (120 mg total) by mouth 2 (two) times daily. 10/02/15   Leana Roe Elgergawy, MD  ferrous sulfate 325 (65 FE) MG tablet Take 325 mg by mouth daily with breakfast.    Historical Provider, MD  furosemide (LASIX) 20 MG tablet Take 20 mg by mouth daily.    Historical Provider, MD  ibandronate (BONIVA) 150 MG tablet Take 150 mg by mouth every 30 (thirty) days. Take in the morning with a full glass of water, on an empty stomach, and do not take anything else by mouth or lie down for the next 30 min.    Historical Provider, MD  insulin aspart (NOVOLOG) 100 UNIT/ML injection Inject 0-15 Units into the skin every 4 (four) hours. 10/02/15   Leana Roe Elgergawy, MD  insulin glargine (LANTUS) 100 UNIT/ML injection Inject 0.05 mLs (5 Units total) into the skin daily. 10/02/15   Leana Roe Elgergawy, MD  ipratropium-albuterol (DUONEB) 0.5-2.5 (3) MG/3ML SOLN Take 3 mLs by nebulization 3 (three) times daily. 02/09/15   Ripudeep Jenna Luo, MD  LORazepam (ATIVAN) 0.5 MG tablet Take 1 tablet (0.5 mg total) by mouth every 8 (eight) hours as needed for anxiety. 09/28/15   Leana Roe Elgergawy, MD  Multiple Vitamins-Minerals (CENTRUM SILVER ADULT 50+ PO) Take 1 tablet by mouth daily.    Historical Provider, MD  oxybutynin (DITROPAN) 5 MG tablet Take 0.5 tablets (2.5 mg total) by mouth 2 (two) times daily. 02/09/15   Ripudeep Jenna Luo, MD  OXYGEN Inhale 2.5 L into the  lungs continuous.    Historical Provider, MD  pantoprazole (PROTONIX) 40 MG tablet Take 1 tablet (40 mg total) by mouth daily. 10/02/15   Leana Roe Elgergawy, MD  polyethylene glycol (MIRALAX / GLYCOLAX) packet Take 17 g by mouth daily. 02/09/15   Ripudeep Jenna Luo, MD  predniSONE (DELTASONE) 10 MG tablet Please take   20 mg daily for 2 days, then 10 mg daily for 2 days, then stop. 10/02/15   Leana Roe Elgergawy, MD  rivaroxaban (XARELTO) 20 MG TABS tablet Take 1 tablet (20 mg total)  by mouth daily with supper. Start tomorrow on 6/14, already received dose during hospital stay today 10/03/15   Starleen Arms, MD  senna-docusate (SENOKOT-S) 8.6-50 MG tablet Take 2 tablets by mouth at bedtime. 02/09/15   Ripudeep Jenna Luo, MD  tiotropium (SPIRIVA) 18 MCG inhalation capsule Place 18 mcg into inhaler and inhale daily.     Historical Provider, MD  traZODone (DESYREL) 50 MG tablet Take 50 mg by mouth at bedtime.    Historical Provider, MD    Family History Family History  Problem Relation Age of Onset  . Heart disease Mother   . Cancer Father     lung  . Cancer Sister     breast    Social History Social History  Substance Use Topics  . Smoking status: Former Smoker    Packs/day: 1.50    Years: 40.00    Types: Cigarettes  . Smokeless tobacco: Never Used     Comment: quit 2006  . Alcohol use 0.6 oz/week    1 Shots of liquor per week     Allergies   Gold-containing drug products and Tape   Review of Systems Review of Systems  Unable to perform ROS: Severe respiratory distress     Physical Exam Updated Vital Signs BP 129/66 (BP Location: Left Arm)   Pulse 119   Temp 97.9 F (36.6 C) (Axillary)   Resp 21   Ht 5\' 4"  (1.626 m)   Wt 267 lb (121.1 kg)   SpO2 100%   BMI 45.83 kg/m   Physical Exam  Nursing note and vitals reviewed.  70 year old female, resting comfortably and in no acute distress. Vital signs are significant for tachycardia and tachypnea. Oxygen saturation is  100%, which is normal - but only while on BiPAP. Head is normocephalic and atraumatic. PERRLA, EOMI. Oropharynx is clear. Neck is nontender and supple without adenopathy or JVD. Back is nontender and there is no CVA tenderness. Lungs have markedly diminished air flow. Rales are heard at the left base. No definite wheezing appreciated. Chest is nontender. Heart has regular rate and rhythm without murmur. Abdomen is soft, flat, nontender without masses or hepatosplenomegaly and peristalsis is normoactive. Extremities have 3+ edema, full range of motion is present. Skin is warm and dry without rash. Neurologic: She is somnolent but arousable and will respond to pain but will not answer questions, cranial nerves are intact, there are no gross motor or sensory deficits.  ED Treatments / Results  Labs (all labs ordered are listed, but only abnormal results are displayed) Labs Reviewed  COMPREHENSIVE METABOLIC PANEL - Abnormal; Notable for the following:       Result Value   Chloride 83 (*)    CO2 41 (*)    Glucose, Bld 231 (*)    Creatinine, Ser 1.22 (*)    Albumin 2.9 (*)    ALT 13 (*)    GFR calc non Af Amer 44 (*)    GFR calc Af Amer 51 (*)    All other components within normal limits  TROPONIN I - Abnormal; Notable for the following:    Troponin I 0.08 (*)    All other components within normal limits  BRAIN NATRIURETIC PEPTIDE - Abnormal; Notable for the following:    B Natriuretic Peptide 192.3 (*)    All other components within normal limits  CBC WITH DIFFERENTIAL/PLATELET - Abnormal; Notable for the following:    WBC 32.4 (*)    Hemoglobin 10.8 (*)  MCHC 28.7 (*)    Neutro Abs 28.8 (*)    Monocytes Absolute 2.3 (*)    All other components within normal limits  BLOOD GAS, ARTERIAL - Abnormal; Notable for the following:    pH, Arterial 7.213 (*)    pCO2 arterial 113 (*)    pO2, Arterial 147 (*)    Bicarbonate 43.9 (*)    Acid-Base Excess 15.7 (*)    All other components  within normal limits  I-STAT ARTERIAL BLOOD GAS, ED  I-STAT ARTERIAL BLOOD GAS, ED    EKG  EKG Interpretation  Date/Time:  Sunday January 06 2016 05:25:53 EDT Ventricular Rate:  121 PR Interval:    QRS Duration: 84 QT Interval:  298 QTC Calculation: 423 R Axis:   34 Text Interpretation:  Sinus tachycardia Probable left atrial enlargement Low voltage, precordial leads When compared with ECG of 09/30/2015, HEART RATE has increased Confirmed by Preston Fleeting  MD, Jaquese Irving ([id number]) on 01/06/2016 5:50:55 AM       Radiology Dg Chest Port 1 View  Result Date: 01/06/2016 CLINICAL DATA:  Dyspnea today.  On BiPAP. EXAM: PORTABLE CHEST 1 VIEW COMPARISON:  Radiographs 09/26/2015 FINDINGS: Lung bases excluded from the field of view. The lungs are hyperinflated with chronic bronchial thickening. Patchy opacities at both lung bases. Cardiomediastinal contours are unchanged. There is chronic biapical pleural parenchymal scarring. Remote left rib fractures. IMPRESSION: Patchy bibasilar opacities may be atelectasis or pneumonia, pulmonary edema is felt less likely. Background emphysema with bronchial thickening and hyperinflation. Electronically Signed   By: Rubye Oaks M.D.   On: 01/06/2016 06:06    Procedures Procedures (including critical care time) INTUBATION Performed by: [facility]  Required items: required blood products, implants, devices, and special equipment available Patient identity confirmed: provided demographic data and hospital-assigned identification number Time out: Immediately prior to procedure a "time out" was called to verify the correct patient, procedure, equipment, support staff and site/side marked as required.  Indications: Acute respiratory failure   Intubation method: Glidescope Laryngoscopy   Preoxygenation: BVM  Sedatives: Ketamine  Paralytic: Succinylcholine  Tube Size: 7.5 cuffed  Post-procedure assessment: chest rise and ETCO2 monitor Breath sounds: equal and  absent over the epigastrium Tube secured with: ETT holder Chest x-ray interpreted by radiologist and me.  Chest x-ray findings: endotracheal tube in appropriate position  Patient tolerated the procedure well with no immediate complications.    CRITICAL CARE Performed by: Dione Booze Total critical care time: 130 minutes Critical care time was exclusive of separately billable procedures and treating other patients. Critical care was necessary to treat or prevent imminent or life-threatening deterioration. Critical care was time spent personally by me on the following activities: development of treatment plan with patient and/or surrogate as well as nursing, discussions with consultants, evaluation of patient's response to treatment, examination of patient, obtaining history from patient or surrogate, ordering and performing treatments and interventions, ordering and review of laboratory studies, ordering and review of radiographic studies, pulse oximetry and re-evaluation of patient's condition.  Medications Ordered in ED Medications  azithromycin (ZITHROMAX) 500 mg in dextrose 5 % 250 mL IVPB (500 mg Intravenous New Bag/Given 01/06/16 0702)  Ketamine HCl-Sodium Chloride 100-0.9 MG/10ML-% SOSY (not administered)  ketamine (KETALAR) injection (100 mg Intravenous Given 01/06/16 0657)  succinylcholine (ANECTINE) injection (150 mg Intravenous Given 01/06/16 0658)  propofol (DIPRIVAN) 1000 MG/100ML infusion (not administered)  LORazepam (ATIVAN) 2 MG/ML injection (not administered)  LORazepam (ATIVAN) injection 2 mg (not administered)  furosemide (LASIX) injection 40 mg (40 mg Intravenous  Given 01/06/16 0652)  cefTRIAXone (ROCEPHIN) 1 g in dextrose 5 % 50 mL IVPB (1 g Intravenous New Bag/Given 01/06/16 2683)     Initial Impression / Assessment and Plan / ED Course  I have reviewed the triage vital signs and the nursing notes.  Pertinent labs & imaging results that were available during my care  of the patient were reviewed by me and considered in my medical decision making (see chart for details).  Clinical Course   Acute respiratory failure which is probably related to medication noncompliance. Unclear at this point how much his COPD and how much is CHF. Review of old records shows ED visits and hospitalizations for COPD. She does have history of respiratory failure with hypercarbia. Chest x-ray will be obtained to help and determine if she has significant pulmonary edema. Screening labs are obtained. In the meantime, she is being maintained on BiPAP.  Chest x-ray shows no evidence of pulmonary edema, possible infiltrate present. She is started on antibodies for community-acquired pneumonia-ceftriaxone and azithromycin. ABG showed respiratory acidosis. Review of prior ABG shows that she has baseline CO2 retention. Oxygen saturation was 98%, so oxygen level was decreased. An attempt was made to see if she would improve on BiPAP. However, we are unable to find an oxygen level that was low enough to prevent over oxygenation, but high enough to prevent severe hypoxia. Decision was made to intubate the patient. She was intubated using rapid sequence induction with ketamine and succinylcholine. Case is discussed with Dr. Tyson Alias of pulmonary critical care medicine who agrees to admit the patient.  Final Clinical Impressions(s) / ED Diagnoses   Final diagnoses:  Acute and chronic respiratory failure with hypercapnia (HCC)  COPD exacerbation (HCC)  Elevated troponin I level  Normochromic normocytic anemia    New Prescriptions New Prescriptions   No medications on file     Dione Booze, MD 01/06/16 [id number]

## 2016-01-06 NOTE — Progress Notes (Signed)
Patient intubated by ED physician with a 7.5 ETT taped at 24 cm at lips, good color change on ETCO2 detector, good equal BBS, SATS 99%, X-ray confirmed placement. Placed on above vent settings MD aware. Will continue to monitor patient.

## 2016-01-06 NOTE — Progress Notes (Signed)
eLink Physician-Brief Progress Note Patient Name: [name] DOB: [birth year] MRN: [id number]   Date of Service  01/06/2016  HPI/Events of Note  Low magnesium  eICU Interventions  replaced     Intervention Category Minor Interventions: Electrolytes abnormality - evaluation and management  Henry Russel, P 01/06/2016, 7:28 PM

## 2016-01-06 NOTE — ED Notes (Signed)
Pt sister at bedside. Updated on pt by dr. Preston Fleeting and this RN.

## 2016-01-06 NOTE — ED Notes (Signed)
Critical care NP at bedside.  

## 2016-01-06 NOTE — Progress Notes (Signed)
ANTICOAGULATION & ANTIBIOTIC CONSULT NOTE - Initial Consult  Pharmacy Consult for Heparin; Vancomycin + Ceftazidime Indication: atrial fibrillation; HCAP  Allergies  Allergen Reactions  . Gold-Containing Drug Products Other (See Comments)    Blisters   . Tape Dermatitis    Patient Measurements: Height: 5\' 10"  (177.8 cm) Weight: 267 lb (121.1 kg) IBW/kg (Calculated) : 68.5 Heparin Dosing Weight: 84.2  Vital Signs: Temp: 97.9 F (36.6 C) (09/17 0530) Temp Source: Axillary (09/17 0530) BP: 129/66 (09/17 0530) Pulse Rate: 119 (09/17 0530)  Labs:  Recent Labs  01/06/16 0538  HGB 10.8*  HCT 37.6  PLT 389  CREATININE 1.22*  TROPONINI 0.08*    Estimated Creatinine Clearance: 60.6 mL/min (by C-G formula based on SCr of 1.22 mg/dL (H)).   Medical History: Past Medical History:  Diagnosis Date  . Acute respiratory failure (HCC) 02/05/2017  . Adrenal disorder, other    "adrenal Incidentaloma"  . Anemia   . Aneurysm (HCC)    x2  . Arthritis   . Asthma   . Atrial fibrillation (HCC)   . Brain aneurysm    have a "giant aneurysm, Dr. 02/07/2017 has stented and coiled in past x 4  . Bronchitis    history of  . Cerebral aneurysm   . Complication of anesthesia    Has woken up during surgery before  . COPD (chronic obstructive pulmonary disease) (HCC)    Dr. Bedelia Person, uses 2.5L of o2 as needed  . Depression    due to loss of spouse 6 years ago  . Diabetes mellitus    metformin  . Emphysema of lung (HCC)    requested anethesia consult   . GERD (gastroesophageal reflux disease)    omeprazole  . Hypertension   . Obesity   . Rheumatoid arthritis(714.0)    has had for approx. 40years, sees Dr. [name]  . Shortness of breath   . Sleep apnea    does not use cpap  . Urinary tract infection    history of   Assessment: 70 year old fe female with COPD, DM, CKD III, chronic anemia, and atrial fibrillation with RVR back in June initiated on Xarelto was found lethargic at  home by family and brought to ED by EMS. Patient was hypoxic and hypercarbic and intubated in the ED. Pharmacy has been consulted to transition from Xarelto to Heparin drip for atrial fibrillation as well as start Vancomycin and Ceftazidime for possible HCAP.  Last dose of Xarelto was sometime yesterday. Family is unsure of exact time of day and states patient took when she could remember but that all medications from her pill container were gone for Saturday. Baseline aPTT is 32. Heparin level is elevated at 0.96.   Patient has significant leukocytosis (WBC 32.4; ANC 28). Temp is 97.9. Patient was given Azithromycin 500mg  and Ceftriaxone 1g in the ED at 0700 AM. Due to recent hospitalization and SNF stay, MD is broadening coverage for HCAP. Cultures are pending. Current SCr is 1.22 (baseline is about 1) with normalized CrCl ~55 to 60 mL/min.   Goal of Therapy:  Heparin level 0.3-0.7 units/ml aPTT 66-102 seconds Monitor platelets by anticoagulation protocol: Yes  Vancomycin trough 15-20 mcg/mL  Plan:  No bolus due to recent Xarelto and unknown exact time of dose. Start Heparin at 1150 units/hr. Check aPTT in 8 hours. Daily HL and aPTT while on therapy Continue to monitor H&H and platelets   Start Vancomycin 2g IV now, then 1g IV every 12 hours.  Start  Ceftazidime 1g IV every 8 hours. Monitor renal function and adjust dosing as appropriate. Monitor culture results and ability to narrow therapy.   Link Snuffer, PharmD, BCPS Clinical Pharmacist [phone]  01/06/2016,8:59 AM

## 2016-01-06 NOTE — ED Notes (Signed)
Critical care MD at bedside 

## 2016-01-06 NOTE — ED Notes (Signed)
CRITICAL VALUE ALERT  Critical value received:  Troponin 0.08   

## 2016-01-06 NOTE — ED Notes (Signed)
Propofol started at 38mcg/kg/min per dr Preston Fleeting. 20mg  bolus \\gived  due to pt reaching for ET tube. Pt continues to be restless. Additional medication requested.

## 2016-01-06 NOTE — Progress Notes (Signed)
eLink Physician-Brief Progress Note Patient Name: [name] DOB: 1945/06/23 MRN: [id number]   Date of Service  01/06/2016  HPI/Events of Note  afib with RVR.  Systolic bp greater than 100.  eICU Interventions  cardizem drip.     Intervention Category Intermediate Interventions: Arrhythmia - evaluation and management  Henry Russel, P 01/06/2016, 4:36 PM

## 2016-01-07 ENCOUNTER — Inpatient Hospital Stay (HOSPITAL_COMMUNITY): Payer: Medicare Other

## 2016-01-07 LAB — CBC
HEMATOCRIT: 32.2 % — AB (ref 36.0–46.0)
Hemoglobin: 9.5 g/dL — ABNORMAL LOW (ref 12.0–15.0)
MCH: 26 pg (ref 26.0–34.0)
MCHC: 29.5 g/dL — ABNORMAL LOW (ref 30.0–36.0)
MCV: 88 fL (ref 78.0–100.0)
Platelets: 344 10*3/uL (ref 150–400)
RBC: 3.66 MIL/uL — ABNORMAL LOW (ref 3.87–5.11)
RDW: 15.3 % (ref 11.5–15.5)
WBC: 22.1 10*3/uL — AB (ref 4.0–10.5)

## 2016-01-07 LAB — HEMOGLOBIN A1C
Hgb A1c MFr Bld: 7.5 % — ABNORMAL HIGH (ref 4.8–5.6)
MEAN PLASMA GLUCOSE: 169 mg/dL

## 2016-01-07 LAB — GLUCOSE, CAPILLARY
GLUCOSE-CAPILLARY: 239 mg/dL — AB (ref 65–99)
GLUCOSE-CAPILLARY: 204 mg/dL — AB (ref 65–99)
GLUCOSE-CAPILLARY: 174 mg/dL — AB (ref 65–99)
GLUCOSE-CAPILLARY: 164 mg/dL — AB (ref 65–99)
GLUCOSE-CAPILLARY: 175 mg/dL — AB (ref 65–99)
GLUCOSE-CAPILLARY: 166 mg/dL — AB (ref 65–99)
GLUCOSE-CAPILLARY: 173 mg/dL — AB (ref 65–99)
GLUCOSE-CAPILLARY: 146 mg/dL — AB (ref 65–99)
GLUCOSE-CAPILLARY: 145 mg/dL — AB (ref 65–99)
GLUCOSE-CAPILLARY: 145 mg/dL — AB (ref 65–99)
GLUCOSE-CAPILLARY: 148 mg/dL — AB (ref 65–99)
GLUCOSE-CAPILLARY: 133 mg/dL — AB (ref 65–99)
GLUCOSE-CAPILLARY: 177 mg/dL — AB (ref 65–99)
GLUCOSE-CAPILLARY: 194 mg/dL — AB (ref 65–99)
GLUCOSE-CAPILLARY: 168 mg/dL — AB (ref 65–99)
Glucose-Capillary: 144 mg/dL — ABNORMAL HIGH (ref 65–99)
Glucose-Capillary: 150 mg/dL — ABNORMAL HIGH (ref 65–99)
Glucose-Capillary: 151 mg/dL — ABNORMAL HIGH (ref 65–99)
Glucose-Capillary: 185 mg/dL — ABNORMAL HIGH (ref 65–99)
Glucose-Capillary: 191 mg/dL — ABNORMAL HIGH (ref 65–99)
Glucose-Capillary: 199 mg/dL — ABNORMAL HIGH (ref 65–99)
Glucose-Capillary: 296 mg/dL — ABNORMAL HIGH (ref 65–99)

## 2016-01-07 LAB — BASIC METABOLIC PANEL
Anion gap: 15 (ref 5–15)
BUN: 28 mg/dL — AB (ref 6–20)
CALCIUM: 8.8 mg/dL — AB (ref 8.9–10.3)
CHLORIDE: 84 mmol/L — AB (ref 101–111)
CO2: 35 mmol/L — AB (ref 22–32)
CREATININE: 1.71 mg/dL — AB (ref 0.44–1.00)
GFR calc non Af Amer: 29 mL/min — ABNORMAL LOW (ref >60)
GFR, EST AFRICAN AMERICAN: 34 mL/min — AB (ref >60)
GLUCOSE: 184 mg/dL — AB (ref 65–99)
Potassium: 3.1 mmol/L — ABNORMAL LOW (ref 3.5–5.1)
Sodium: 134 mmol/L — ABNORMAL LOW (ref 135–145)

## 2016-01-07 LAB — PHOSPHORUS
PHOSPHORUS: 2.3 mg/dL — AB (ref 2.5–4.6)
Phosphorus: 2.2 mg/dL — ABNORMAL LOW (ref 2.5–4.6)

## 2016-01-07 LAB — BLOOD GAS, ARTERIAL
Acid-Base Excess: 15 mmol/L — ABNORMAL HIGH (ref 0.0–2.0)
BICARBONATE: 39.3 mmol/L — AB (ref 20.0–28.0)
DRAWN BY: 36277
FIO2: 0.5
LHR: 24 {breaths}/min
MECHVT: 580 mL
O2 Saturation: 96.9 %
PATIENT TEMPERATURE: 97.9
PCO2 ART: 48.1 mmHg — AB (ref 32.0–48.0)
PEEP: 5 cmH2O
PO2 ART: 83.2 mmHg (ref 83.0–108.0)
pH, Arterial: 7.521 — ABNORMAL HIGH (ref 7.350–7.450)

## 2016-01-07 LAB — MAGNESIUM
MAGNESIUM: 1.6 mg/dL — AB (ref 1.7–2.4)
Magnesium: 1.5 mg/dL — ABNORMAL LOW (ref 1.7–2.4)

## 2016-01-07 LAB — HEPARIN LEVEL (UNFRACTIONATED)
HEPARIN UNFRACTIONATED: 0.24 [IU]/mL — AB (ref 0.30–0.70)
Heparin Unfractionated: 0.24 IU/mL — ABNORMAL LOW (ref 0.30–0.70)

## 2016-01-07 LAB — POCT I-STAT 3, ART BLOOD GAS (G3+)
Acid-Base Excess: 11 mmol/L — ABNORMAL HIGH (ref 0.0–2.0)
BICARBONATE: 37.4 mmol/L — AB (ref 20.0–28.0)
O2 Saturation: 96 %
PH ART: 7.437 (ref 7.350–7.450)
TCO2: 39 mmol/L (ref 0–100)
pCO2 arterial: 55.5 mmHg — ABNORMAL HIGH (ref 32.0–48.0)
pO2, Arterial: 79 mmHg — ABNORMAL LOW (ref 83.0–108.0)

## 2016-01-07 LAB — PROCALCITONIN: Procalcitonin: 0.95 ng/mL

## 2016-01-07 LAB — LEGIONELLA PNEUMOPHILA SEROGP 1 UR AG: L. pneumophila Serogp 1 Ur Ag: NEGATIVE

## 2016-01-07 LAB — APTT: APTT: 49 s — AB (ref 24–36)

## 2016-01-07 MED ORDER — DEXTROSE 5 % IV SOLN
1.0000 g | Freq: Two times a day (BID) | INTRAVENOUS | Status: DC
  Filled 2016-01-07: qty 1

## 2016-01-07 MED ORDER — VITAL HIGH PROTEIN PO LIQD: 1000.0000 mL | ORAL | Status: DC

## 2016-01-07 MED ORDER — METOPROLOL TARTRATE 5 MG/5ML IV SOLN
5.0000 mg | Freq: Three times a day (TID) | INTRAVENOUS | Status: DC
  Administered 2016-01-07 (×2): 5 mg via INTRAVENOUS
  Filled 2016-01-07 (×4): qty 5

## 2016-01-07 MED ORDER — LORAZEPAM 2 MG/ML IJ SOLN
INTRAMUSCULAR | Status: AC
  Filled 2016-01-07: qty 1

## 2016-01-07 MED ORDER — HEPARIN BOLUS VIA INFUSION
1250.0000 [IU] | Freq: Once | INTRAVENOUS | Status: AC
  Administered 2016-01-07: 1250 [IU] via INTRAVENOUS
  Filled 2016-01-07: qty 1250

## 2016-01-07 MED ORDER — DILTIAZEM LOAD VIA INFUSION
10.0000 mg | Freq: Once | INTRAVENOUS | Status: AC
  Administered 2016-01-07: 10 mg via INTRAVENOUS
  Filled 2016-01-07: qty 10

## 2016-01-07 MED ORDER — SODIUM CHLORIDE 0.9 % IV SOLN
INTRAVENOUS | Status: DC
  Administered 2016-01-07 – 2016-01-09 (×4): via INTRAVENOUS

## 2016-01-07 MED ORDER — MAGNESIUM SULFATE 2 GM/50ML IV SOLN
2.0000 g | Freq: Once | INTRAVENOUS | Status: AC
  Administered 2016-01-07: 2 g via INTRAVENOUS
  Filled 2016-01-07: qty 50

## 2016-01-07 MED ORDER — PRO-STAT SUGAR FREE PO LIQD
60.0000 mL | Freq: Four times a day (QID) | ORAL | Status: DC
  Administered 2016-01-07 – 2016-01-08 (×7): 60 mL
  Filled 2016-01-07 (×9): qty 60

## 2016-01-07 MED ORDER — DEXTROSE 5 % IV SOLN
2.0000 g | INTRAVENOUS | Status: AC
  Administered 2016-01-07 – 2016-01-12 (×6): 2 g via INTRAVENOUS
  Filled 2016-01-07 (×6): qty 2

## 2016-01-07 MED ORDER — INSULIN GLARGINE 100 UNIT/ML ~~LOC~~ SOLN
10.0000 [IU] | Freq: Every day | SUBCUTANEOUS | Status: DC
  Filled 2016-01-07: qty 0.1

## 2016-01-07 MED ORDER — VITAL HIGH PROTEIN PO LIQD
1000.0000 mL | ORAL | Status: DC
  Administered 2016-01-07: 1000 mL
  Filled 2016-01-07: qty 1000

## 2016-01-07 MED ORDER — VANCOMYCIN HCL 10 G IV SOLR
1250.0000 mg | INTRAVENOUS | Status: DC
  Filled 2016-01-07: qty 1250

## 2016-01-07 MED ORDER — POTASSIUM CHLORIDE 20 MEQ/15ML (10%) PO SOLN
40.0000 meq | Freq: Once | ORAL | Status: AC
  Administered 2016-01-07: 40 meq
  Filled 2016-01-07: qty 30

## 2016-01-07 MED ORDER — DEXTROSE 5 % IV SOLN
5.0000 mg/h | INTRAVENOUS | Status: DC
  Administered 2016-01-07 – 2016-01-08 (×3): 10 mg/h via INTRAVENOUS
  Filled 2016-01-07 (×7): qty 100

## 2016-01-07 MED ORDER — LORAZEPAM 2 MG/ML IJ SOLN
1.0000 mg | Freq: Once | INTRAMUSCULAR | Status: AC
  Administered 2016-01-07: 1 mg via INTRAVENOUS

## 2016-01-07 MED ORDER — PRO-STAT SUGAR FREE PO LIQD
30.0000 mL | Freq: Two times a day (BID) | ORAL | Status: DC
  Filled 2016-01-07: qty 30

## 2016-01-07 NOTE — Progress Notes (Signed)
ANTICOAGULATION CONSULT NOTE  Pharmacy Consult for Heparin Indication: atrial fibrillation  Allergies  Allergen Reactions  . Gold-Containing Drug Products Other (See Comments)    Blisters   . Tape Dermatitis    Patient Measurements: Height: 5\' 10"  (177.8 cm) Weight: 240 lb 8 oz (109.1 kg) IBW/kg (Calculated) : 68.5  Vital Signs: Temp: 99.5 F (37.5 C) (09/18 1100) Temp Source: Oral (09/18 1100) BP: 124/70 (09/18 1100) Pulse Rate: 112 (09/18 1100)  Labs:  Recent Labs  01/06/16 0538 01/06/16 0948 01/06/16 1353 01/06/16 2146 01/07/16 0337 01/07/16 0855  HGB 10.8*  --   --   --  9.5*  --   HCT 37.6  --   --   --  32.2*  --   PLT 389  --   --   --  344  --   APTT  --  32  --  42*  --  49*  HEPARINUNFRC  --  0.96*  --   --   --  0.24*  CREATININE 1.22*  --   --   --  1.71*  --   TROPONINI 0.08* 0.13* 0.03* <0.03  --   --     Estimated Creatinine Clearance: 40.9 mL/min (by C-G formula based on SCr of 1.71 mg/dL (H)).   Assessment: 70 y.o. female with VDRF, h/o Afib and Xarelto at home. Xarelto currently on hold, now on heparin, diltiazem, and amiodarone here. Most recent aPTT are correlative, but subtherapeutic. Since levels are now correlative, we can stop monitoring aPTT. Hemoglobin lower today, at 9.5; PLTC lower today, but still wnl. No bleeding noted.   Goal of Therapy:  Heparin Level 0.3-0.7 Monitor platelets by anticoagulation protocol: Yes   Plan:  Bolus 1250 units then increase heparin 1550 units/hr 8 hour heparin level Monitor H/H and bleeding Daily CBC, HL  66, BS, PharmD Clinical Pharmacy Resident [phone] (Pager) 01/07/2016 11:37 AM

## 2016-01-07 NOTE — Progress Notes (Signed)
RN paged Pola Corn regarding pt low urine output. MD aware. New orders received. RN will follow orders and continue to monitor.  Roselie Awkward, RN

## 2016-01-07 NOTE — Progress Notes (Signed)
PCCM INTERVAL PROGRESS NOTE  Long discussion with patient and sister who is retired long time Insurance underwriter. Consensus decision is that we should stay the course with life support and aggressive interventions. If extubated and failed, she would want to be intubated. If the conversation of HD or tracheostomy developed, we would need to revisit this conversation. They feel as though she may not be a good candidate for these interventions.    Joneen Roach, AGACNP-BC [facility] Pulmonology/Critical Care Pager [phone] or [phone]  01/07/2016 4:12 PM

## 2016-01-07 NOTE — Progress Notes (Signed)
PULMONARY / CRITICAL CARE MEDICINE   Name: [name] MRN: [id number] DOB: [birth year]    ADMISSION DATE:  01/06/2016 CONSULTATION DATE:  01/06/16  REFERRING MD:  EDP , Dr. Preston Fleeting   CHIEF COMPLAINT:  Resp Failure   HISTORY OF PRESENT ILLNESS:   70 yo female former smoker with known COPD and chronic resp failure on home O2 at 3-4 l./m with multiple comorbidities with DM, chronic anemia , chronic renal disease-stage III , cerebral aneurysm s/p coiling in 2009 , Afib with RVR 09/2015 s/p cardioversion and begin anticoagulation (xarelto) found lethargic at home by family. EMS brought to ER at [facility] found to be hypoxic and hypercarbic . Placed on BIPAP without sigificant improvement requiring intubation and vent support. ABG showed severe hypercarbia with PCO2 >100. Most recent admission was in June 2017 with similar presentation. Discharged to SNF and then to home. Dr. Tyson Alias conversation with family . Pt is a DNR .   SUBJECTIVE:  Remains on vent. Interval course complicated by AF RVR and progressive renal failure with history of CKD III. Sister will be coming to see her today. She is retired [employer] fund[employer].   VITAL SIGNS: BP (!) 116/59   Pulse (!) 153   Temp 99.2 F (37.3 C) (Oral)   Resp (!) 24   Ht 5\' 10"  (1.778 m)   Wt 109.1 kg (240 lb 8 oz)   SpO2 92%   BMI 34.51 kg/m   HEMODYNAMICS:    VENTILATOR SETTINGS: Vent Mode: PRVC FiO2 (%):  [50 %] 50 % Set Rate:  [24 bmp] 24 bmp Vt Set:  [500 mL-580 mL] 500 mL PEEP:  [5 cmH20] 5 cmH20 Plateau Pressure:  [18 cmH20-27 cmH20] 18 cmH20  INTAKE / OUTPUT: I/O last 3 completed shifts: In: 2128.4 [I.V.:1428.4; IV Piggyback:700] Out: 1279 [Urine:1279]  PHYSICAL EXAMINATION:  General: Obese female in NAD on vent Neuro: sedated on vent, facial grimace to pain only HEENT:  ETT, [place]/AT, PERRL, no JVD Cardiovascular:  ST, no m/r/g Lungs: coarse BS , diminished in bases Abdomen:  Obese , hypoactive BS Musculoskeletal:  Intact Skin:  Intact , no  rash  LABS:  BMET  Recent Labs Lab 01/06/16 0538 01/07/16 0337  NA 136 134*  K 4.2 3.1*  CL 83* 84*  CO2 41* 35*  BUN 16 28*  CREATININE 1.22* 1.71*  GLUCOSE 231* 184*    Electrolytes  Recent Labs Lab 01/06/16 0538 01/06/16 0948 01/07/16 0337  CALCIUM 9.3  --  8.8*  MG  --  1.2*  --   PHOS  --  4.1  --     CBC  Recent Labs Lab 01/06/16 0538 01/07/16 0337  WBC 32.4* 22.1*  HGB 10.8* 9.5*  HCT 37.6 32.2*  PLT 389 344    Coag's  Recent Labs Lab 01/06/16 0948 01/06/16 2146 01/07/16 0855  APTT 32 42* 49*    Sepsis Markers  Recent Labs Lab 01/06/16 0948  LATICACIDVEN 1.8  PROCALCITON 1.43    ABG  Recent Labs Lab 01/06/16 0535 01/06/16 0839 01/07/16 0419  PHART 7.213* 7.392 7.521*  PCO2ART 113* 77.9* 48.1*  PO2ART 147* 84.0 83.2    Liver Enzymes  Recent Labs Lab 01/06/16 0538  AST 15  ALT 13*  ALKPHOS 62  BILITOT 0.4  ALBUMIN 2.9*    Cardiac Enzymes  Recent Labs Lab 01/06/16 0948 01/06/16 1353 01/06/16 2146  TROPONINI 0.13* 0.03* <0.03    Glucose  Recent Labs Lab 01/07/16 0420 01/07/16 0525 01/07/16 0634 01/07/16 0748 01/07/16  0859 01/07/16 1001  GLUCAP 174* 185* 199* 191* 164* 175*    Imaging Dg Chest Port 1 View  Result Date: 01/07/2016 CLINICAL DATA:  Respiratory failure. EXAM: PORTABLE CHEST 1 VIEW COMPARISON:  10/06/2015. FINDINGS: Endotracheal tube and NG tube in stable position. Heart size normal. Low lung volumes with bibasilar atelectasis and/or infiltrates. Findings have progressed slightly from prior exam. Small bilateral pleural effusions. Biapical pleural thickening noted consistent with scarring. Old left rib fractures. IMPRESSION: 1. Lines and tubes in stable position. 2. Low lung volumes with bibasilar atelectasis and/or infiltrates. Bibasilar pneumonia cannot be excluded. Findings have progressed from prior exam . Small bilateral pleural effusions. Electronically Signed   By: Maisie Fus  Register    On: 01/07/2016 06:43   Dg Abd Portable 1v  Result Date: 01/06/2016 CLINICAL DATA:  Orogastric tube placement EXAM: PORTABLE ABDOMEN - 1 VIEW COMPARISON:  Portable exam 1215 hours without priors for comparison FINDINGS: Tip of orogastric tube projects over gastric antrum. Visualized bowel gas pattern normal. Lung bases clear. IMPRESSION: Tip of orogastric tube projects over gastric antrum. Electronically Signed   By: Ulyses Southward M.D.   On: 01/06/2016 12:37     STUDIES:  CXR 9/18 > progression of bibasilar infiltrates.   CULTURES: 9/17 BC x 2 >> 9/17 Sputum > Abundant gram negative coccobacilli >>> strep > neg leg urine ag >>>  ANTIBIOTICS: 9/17 Ceftaz>> 9/17 Vanc >> Azithromycin 9/17 only  SIGNIFICANT EVENTS: Admitted with hypercarbic/hypoxic RF on vent to ICU   LINES/TUBES: 9/17 ETT >>  DISCUSSION: 70 yo female with severe COPD on home O2 at 3-4l/m admitted with COPD exacerbation +HCAP w/ hypercarbic/hypoxic RF requiring intubation. Course complicated by AF RVR (of which she has history) and acute renal failure (hx CKD III).    ASSESSMENT / PLAN:  PULMONARY A: Acute on Chronic  Hypercarbic/Hypoxic Resp Failure +/- PNA/fluid overload requiring vent support  COPD exacerbation (home symbicort/spiriva) Home O2 at 4l/m  ?OSA/OHS  not on CPAP at home   P:   Cont full Vent support - settings reviewed and adjusted based on ABG this AM, Repeat ABG 1300 Will attempt to determine volume status with IVC ultrasound vs CVP VAP protocol  Daily eval for wean/SBT  Solumedrol 40mg  q12   CARDIOVASCULAR A:  Tachycardia  HTN  Echo 01/2015 EF 55-60%, mild dilated LA  Atrial Fib (dx 09/2015 ) cardioversion 09/2015 , cerebral angiogram d/t prev aneurysm coliing 2009, no contraindication for anticoagualtion d/ch on xarelto  Echo 2016 > wnl P:  Telemetry monitoring Hep drip Home diltiazem to PO Will schedule IV metoprolol in lieu oh home atenolol  RENAL A:   Acute on chronic Kidney  disease ( scr 09/2015 1.0 )  Hypokalemia  P:   Hold further diuresis at this time, may actually be dry. Follow chem panel  K corrected via Elink protocol Replace electrolytes as indicated   GASTROINTESTINAL A:   Nutrition  P:   PPI  NPO  Begin TF today  HEMATOLOGIC A:   Anemia  P:  Monitor H/H on hep   INFECTIOUS A:   HCAP  P:   Ceftaz /Vanc Day 2  Pan Culture  Trend PCT  ENDOCRINE A:   DM  P:   Continue insulin gtt for now.  NEUROLOGIC A:   Sedated on Vent  Unresponsive  P:   RASS goal: 0 to -1  PAD protocol w/ propofol /fent  Holding home trazodone  WUA now   FAMILY  - Updates: Family updated by  RN 9/18 AM and will be coming in today for GOC discussion. Will update at that time.   - Inter-disciplinary family meet or Palliative Care meeting due by:  9/24  Joneen Roach, AGACNP-BC  Pulmonology/Critical Care Pager [phone] or [phone]  01/07/2016 10:36 AM

## 2016-01-07 NOTE — Progress Notes (Signed)
eLink Physician-Brief Progress Note Patient Name: [name] DOB: 05-14-1945 MRN: [id number]   Date of Service  01/07/2016  HPI/Events of Note  Low mg  eICU Interventions  Replace mg     Intervention Category Intermediate Interventions: Other:  Louann Sjogren 01/07/2016, 9:19 PM

## 2016-01-07 NOTE — Progress Notes (Signed)
eLink Physician-Brief Progress Note Patient Name: [name] DOB: [birth year] MRN: [id number]   Date of Service  01/07/2016  HPI/Events of Note  Low urine output  eICU Interventions  Start NS infusion at 75cc/hr     Intervention Category Evaluation Type: Other  Clois Treanor 01/07/2016, 2:40 AM

## 2016-01-07 NOTE — Care Management Note (Signed)
Case Management Note  Patient Details  Name: [name] MRN: [id number] Date of Birth: 21-Aug-1945  Subjective/Objective:    Pt admitted for Acute on Chronic  Hypercarbic/Hypoxic Resp Failure +/- PNA/fluid overload requiring vent support                 Action/Plan:  Pt coming from home ; previously discharged to SNF - however has been discharged from SNF to home.  Pt is on home O2 3-4 liters.  CM will inform CSW of tenative consult for discharge depending on pts progression.   Expected Discharge Date:                  Expected Discharge Plan:     In-House Referral:  Clinical Social Work  Discharge planning Services  CM Consult  Post Acute Care Choice:    Choice offered to:     DME Arranged:    DME Agency:     HH Arranged:    HH Agency:     Status of Service:  In process, will continue to follow  If discussed at Long Length of Stay Meetings, dates discussed:    Additional Comments:  Cherylann Parr, RN 01/07/2016, 8:49 AM

## 2016-01-07 NOTE — Progress Notes (Signed)
eLink Physician-Brief Progress Note Patient Name: [name] DOB: July 31, 1945 MRN: [id number]   Date of Service  01/07/2016  HPI/Events of Note  K 3.1  eICU Interventions  Replete 40 meq by NG tube        [name] 01/07/2016, 5:12 AM

## 2016-01-07 NOTE — Progress Notes (Signed)
Pharmacy Antibiotic Note  [name] is a 69 y.o. female admitted on 01/06/2016 with HCAP. Today is day 3 of abx for treatment of HCAP. Afebrile with WBC down to 22.1. Started on CAP coverage in the ED, but broadened d/t recent hospitalization and SNF placement. Cultures returned with H. Influenzae. Renal function declined today, as well. Will de-escalate therapy back to ceftriaxone.   Plan: Ceftriaxone 2g IV every 24 hours Monitor culture results and clinical improvement  Height: 5\' 10"  (177.8 cm) Weight: 240 lb 8 oz (109.1 kg) IBW/kg (Calculated) : 68.5  Temp (24hrs), Avg:98.9 F (37.2 C), Min:97.9 F (36.6 C), Max:99.7 F (37.6 C)   Recent Labs Lab 01/06/16 0538 01/06/16 0948 01/07/16 0337  WBC 32.4*  --  22.1*  CREATININE 1.22*  --  1.71*  LATICACIDVEN  --  1.8  --     Estimated Creatinine Clearance: 40.9 mL/min (by C-G formula based on SCr of 1.71 mg/dL (H)).    Allergies  Allergen Reactions  . Gold-Containing Drug Products Other (See Comments)    Blisters   . Tape Dermatitis    Antimicrobials this admission:  9/17 Azith x1 9/17 CTX x1; 9/18>> 9/17 Vanc >>9/18  9/17 10/17 >>9/18  Dose Adjustments This Admission: Ceftazidime 1g IV q8h >> Ceftazidime 1g IV q12h Vancomycin 1000 mg IV q12h >> Vancomycin 1250 mg IV q24h  Microbiology results:  9/17 BCx: Sent 9/17 Trach aspirate: Moderate H. Influenzae (beta-lactamase positive) 9/17 MRSA PCR: positive  Imaging: 9/18 CXR: Bibasilar pneumonia cannot be excluded.   Thank you for allowing pharmacy to be a part of this patient's care.  10/18, Alfredo Bach, PharmD Clinical Pharmacy Resident [phone] (Pager) 01/07/2016 12:17 PM

## 2016-01-07 NOTE — Progress Notes (Signed)
Pt still in Afib RVR 120's-160's. [facility] paged. New orders received. Per Dr.Smith continue Cardizem gtt with Amiodarone gtt for now. RN will follow orders and continue to monitor.  Roselie Awkward, RN

## 2016-01-07 NOTE — Progress Notes (Signed)
Called to room per RN stating that patient is desatting. Patient was assessed and noted coarse rhonchus breath sounds. Pt was manually ventilated  bagged lavage on 100% FiO2 with NS10cc due to patient desaturation. Got back copious amounts of thick green purulent  Secretions/muscous plugs. Pt tolerated suctioning well no distress or complications noted. Pt sats are now stable. RN aware. RT will continue to monitor.

## 2016-01-07 NOTE — Progress Notes (Signed)
Initial Nutrition Assessment  DOCUMENTATION CODES:   Obesity unspecified  INTERVENTION:    Initiate TF via OGT with Vital High Protein at goal rate of 10 ml/h (240 ml per day) and Prostat 60 ml QID to provide 1040 kcals, 141 gm protein, 201 ml free water daily.  Total intake with Propofol and TF will be 1808 kcal (slightly exceeding calorie goal to ensure adequate protein intake).  NUTRITION DIAGNOSIS:   Inadequate oral intake related to inability to eat as evidenced by NPO status.  GOAL:   Provide needs based on ASPEN/SCCM guidelines  MONITOR:   Vent status, Labs, Weight trends, I & O's, TF tolerance  REASON FOR ASSESSMENT:   Consult Enteral/tube feeding initiation and management  ASSESSMENT:   70 yo female with severe COPD on home O2 at 3-4l/m admitted with COPD exacerbation +HCAP w/ hypercarbic/hypoxic RF on vent .   Labs reviewed: sodium, potassium, magnesium low. CBG's: [phone] Medications reviewed and include solumedrol, insulin, propofol.  Patient is currently intubated on ventilator support Temp (24hrs), Avg:99 F (37.2 C), Min:97.9 F (36.6 C), Max:100.2 F (37.9 C)  Propofol: 29.1 ml/hr providing 768 kcal.   Diet Order:  Diet NPO time specified  Skin:  Reviewed, no issues  Last BM:  PTA  Height:   Ht Readings from Last 1 Encounters:  01/06/16 5\' 10"  (1.778 m)    Weight:   Wt Readings from Last 1 Encounters:  01/07/16 240 lb 8 oz (109.1 kg)    Ideal Body Weight:  68.2 kg  BMI:  Body mass index is 34.51 kg/m.  Estimated Nutritional Needs:   Kcal:  01/09/16  Protein:  136 gm  Fluid:  2 L  EDUCATION NEEDS:   No education needs identified at this time  [id number], RD, LDN, CNSC Pager [phone] After Hours Pager [phone]

## 2016-01-07 NOTE — Progress Notes (Signed)
Pt BP 86/60 on Cardizem gtt and Amio gtt. Dr. [name] paged and informed RN to stop Cardizem gtt at this time. RN will continue to monitor.  Roselie Awkward, RN

## 2016-01-07 NOTE — Progress Notes (Signed)
ANTICOAGULATION CONSULT NOTE  Pharmacy Consult for Heparin Indication: atrial fibrillation  Allergies  Allergen Reactions  . Gold-Containing Drug Products Other (See Comments)    Blisters   . Tape Dermatitis    Patient Measurements: Height: 5\' 10"  (177.8 cm) Weight: 242 lb 4.8 oz (109.9 kg) IBW/kg (Calculated) : 68.5  Vital Signs: Temp: 98.4 F (36.9 C) (09/17 1933) Temp Source: Oral (09/17 1933) BP: 119/72 (09/17 2315) Pulse Rate: 124 (09/17 2315)  Labs:  Recent Labs  01/06/16 0538 01/06/16 0948 01/06/16 1353 01/06/16 2146  HGB 10.8*  --   --   --   HCT 37.6  --   --   --   PLT 389  --   --   --   APTT  --  32  --  42*  HEPARINUNFRC  --  0.96*  --   --   CREATININE 1.22*  --   --   --   TROPONINI 0.08* 0.13* 0.03* <0.03    Estimated Creatinine Clearance: 57.6 mL/min (by C-G formula based on SCr of 1.22 mg/dL (H)).   Assessment: 70 y.o. female with VDRF, h/o Afib and Xarelto on hold, for heparin  Goal of Therapy:  APTT 66-102 while Xarelto affecting anti-Xa level Monitor platelets by anticoagulation protocol: Yes   Plan:  Increase Heparin 1400 units/hr Follow-up am labs.   66 01/07/2016,12:06 AM

## 2016-01-07 NOTE — Progress Notes (Signed)
ANTICOAGULATION CONSULT NOTE  Pharmacy Consult for Heparin Indication: atrial fibrillation  Allergies  Allergen Reactions  . Gold-Containing Drug Products Other (See Comments)    Blisters   . Tape Dermatitis    Patient Measurements: Height: 5\' 10"  (177.8 cm) Weight: 240 lb 8 oz (109.1 kg) IBW/kg (Calculated) : 68.5  Vital Signs: Temp: 98.9 F (37.2 C) (09/18 2030) Temp Source: Axillary (09/18 2030) BP: 134/49 (09/18 2000) Pulse Rate: 101 (09/18 2000)  Labs:  Recent Labs  01/06/16 0538 01/06/16 0948 01/06/16 1353 01/06/16 2146 01/07/16 0337 01/07/16 0855 01/07/16 2044  HGB 10.8*  --   --   --  9.5*  --   --   HCT 37.6  --   --   --  32.2*  --   --   PLT 389  --   --   --  344  --   --   APTT  --  32  --  42*  --  49*  --   HEPARINUNFRC  --  0.96*  --   --   --  0.24* 0.24*  CREATININE 1.22*  --   --   --  1.71*  --   --   TROPONINI 0.08* 0.13* 0.03* <0.03  --   --   --    Estimated Creatinine Clearance: 40.9 mL/min (by C-G formula based on SCr of 1.71 mg/dL (H)).  Assessment: 70 y.o. female with VDRF, h/o Afib and Xarelto at home. Xarelto currently on hold, now on heparin, diltiazem, and amiodarone here. Most recent aPTT are correlative, but subtherapeutic. Since levels are now correlative, we can stop monitoring aPTT. Hemoglobin lower today, at 9.5; PLTC lower today, but still wnl. No bleeding noted.   Heparin level = 0.24 is subtherapeutic  Goal of Therapy:  Heparin Level 0.3-0.7 Monitor platelets by anticoagulation protocol: Yes   Plan:  Increase heparin 1700 units/hr Confirm heparin level with AM labs Monitor H/H and  S/sx of bleeding Daily CBC, Heparin level  66, PharmD Clinical Pharmacist Pager: [phone] 01/07/2016 9:14 PM

## 2016-01-08 ENCOUNTER — Inpatient Hospital Stay (HOSPITAL_COMMUNITY): Payer: Medicare Other

## 2016-01-08 DIAGNOSIS — I481 Persistent atrial fibrillation: Secondary | ICD-10-CM

## 2016-01-08 LAB — GLUCOSE, CAPILLARY
GLUCOSE-CAPILLARY: 140 mg/dL — AB (ref 65–99)
GLUCOSE-CAPILLARY: 143 mg/dL — AB (ref 65–99)
GLUCOSE-CAPILLARY: 147 mg/dL — AB (ref 65–99)
GLUCOSE-CAPILLARY: 217 mg/dL — AB (ref 65–99)
Glucose-Capillary: 150 mg/dL — ABNORMAL HIGH (ref 65–99)
Glucose-Capillary: 142 mg/dL — ABNORMAL HIGH (ref 65–99)
Glucose-Capillary: 155 mg/dL — ABNORMAL HIGH (ref 65–99)
Glucose-Capillary: 140 mg/dL — ABNORMAL HIGH (ref 65–99)
Glucose-Capillary: 154 mg/dL — ABNORMAL HIGH (ref 65–99)
Glucose-Capillary: 124 mg/dL — ABNORMAL HIGH (ref 65–99)
Glucose-Capillary: 158 mg/dL — ABNORMAL HIGH (ref 65–99)
Glucose-Capillary: 170 mg/dL — ABNORMAL HIGH (ref 65–99)

## 2016-01-08 LAB — BASIC METABOLIC PANEL
ANION GAP: 13 (ref 5–15)
Anion gap: 16 — ABNORMAL HIGH (ref 5–15)
BUN: 39 mg/dL — ABNORMAL HIGH (ref 6–20)
BUN: 40 mg/dL — ABNORMAL HIGH (ref 6–20)
CALCIUM: 8.4 mg/dL — AB (ref 8.9–10.3)
CHLORIDE: 93 mmol/L — AB (ref 101–111)
CO2: 34 mmol/L — AB (ref 22–32)
CO2: 32 mmol/L (ref 22–32)
CREATININE: 1.45 mg/dL — AB (ref 0.44–1.00)
Calcium: 8.9 mg/dL (ref 8.9–10.3)
Chloride: 92 mmol/L — ABNORMAL LOW (ref 101–111)
Creatinine, Ser: 1.34 mg/dL — ABNORMAL HIGH (ref 0.44–1.00)
GFR calc Af Amer: 41 mL/min — ABNORMAL LOW (ref >60)
GFR calc non Af Amer: 39 mL/min — ABNORMAL LOW (ref >60)
GFR, EST AFRICAN AMERICAN: 45 mL/min — AB (ref >60)
GFR, EST NON AFRICAN AMERICAN: 36 mL/min — AB (ref >60)
GLUCOSE: 135 mg/dL — AB (ref 65–99)
GLUCOSE: 213 mg/dL — AB (ref 65–99)
Potassium: 3 mmol/L — ABNORMAL LOW (ref 3.5–5.1)
Potassium: 4.3 mmol/L (ref 3.5–5.1)
SODIUM: 137 mmol/L (ref 135–145)
Sodium: 143 mmol/L (ref 135–145)

## 2016-01-08 LAB — CBC
HCT: 30.8 % — ABNORMAL LOW (ref 36.0–46.0)
Hemoglobin: 9.3 g/dL — ABNORMAL LOW (ref 12.0–15.0)
MCH: 25.9 pg — AB (ref 26.0–34.0)
MCHC: 30.2 g/dL (ref 30.0–36.0)
MCV: 85.8 fL (ref 78.0–100.0)
Platelets: 338 10*3/uL (ref 150–400)
RBC: 3.59 MIL/uL — ABNORMAL LOW (ref 3.87–5.11)
RDW: 16.4 % — AB (ref 11.5–15.5)
WBC: 18.8 10*3/uL — ABNORMAL HIGH (ref 4.0–10.5)

## 2016-01-08 LAB — PHOSPHORUS
PHOSPHORUS: 3.4 mg/dL (ref 2.5–4.6)
Phosphorus: 4.1 mg/dL (ref 2.5–4.6)

## 2016-01-08 LAB — MAGNESIUM
MAGNESIUM: 2.3 mg/dL (ref 1.7–2.4)
MAGNESIUM: 2 mg/dL (ref 1.7–2.4)

## 2016-01-08 LAB — PROCALCITONIN: Procalcitonin: 0.82 ng/mL

## 2016-01-08 LAB — HEPARIN LEVEL (UNFRACTIONATED): HEPARIN UNFRACTIONATED: 0.38 [IU]/mL (ref 0.30–0.70)

## 2016-01-08 MED ORDER — INSULIN ASPART 100 UNIT/ML ~~LOC~~ SOLN
0.0000 [IU] | SUBCUTANEOUS | Status: DC
  Administered 2016-01-08 (×2): 3 [IU] via SUBCUTANEOUS
  Administered 2016-01-08: 5 [IU] via SUBCUTANEOUS
  Administered 2016-01-09 (×2): 3 [IU] via SUBCUTANEOUS
  Administered 2016-01-09: 8 [IU] via SUBCUTANEOUS
  Administered 2016-01-09: 5 [IU] via SUBCUTANEOUS
  Administered 2016-01-09: 8 [IU] via SUBCUTANEOUS
  Administered 2016-01-09 – 2016-01-10 (×3): 3 [IU] via SUBCUTANEOUS
  Administered 2016-01-10: 2 [IU] via SUBCUTANEOUS
  Administered 2016-01-10: 3 [IU] via SUBCUTANEOUS
  Administered 2016-01-10: 5 [IU] via SUBCUTANEOUS
  Administered 2016-01-11: 2 [IU] via SUBCUTANEOUS
  Administered 2016-01-11 – 2016-01-12 (×3): 3 [IU] via SUBCUTANEOUS
  Administered 2016-01-12: 5 [IU] via SUBCUTANEOUS
  Administered 2016-01-12: 3 [IU] via SUBCUTANEOUS
  Administered 2016-01-13: 5 [IU] via SUBCUTANEOUS
  Administered 2016-01-13: 3 [IU] via SUBCUTANEOUS
  Administered 2016-01-13: 8 [IU] via SUBCUTANEOUS
  Administered 2016-01-13: 3 [IU] via SUBCUTANEOUS
  Administered 2016-01-14: 5 [IU] via SUBCUTANEOUS

## 2016-01-08 MED ORDER — MUPIROCIN 2 % EX OINT
1.0000 "application " | TOPICAL_OINTMENT | Freq: Two times a day (BID) | CUTANEOUS | Status: AC
  Administered 2016-01-08 – 2016-01-12 (×10): 1 via NASAL
  Filled 2016-01-08: qty 22

## 2016-01-08 MED ORDER — CHLORHEXIDINE GLUCONATE CLOTH 2 % EX PADS
6.0000 | MEDICATED_PAD | Freq: Every day | CUTANEOUS | Status: AC
  Administered 2016-01-08 – 2016-01-12 (×5): 6 via TOPICAL

## 2016-01-08 MED ORDER — POTASSIUM CHLORIDE 20 MEQ/15ML (10%) PO SOLN: 40.0000 meq | Freq: Two times a day (BID) | ORAL | Status: DC

## 2016-01-08 MED ORDER — METOPROLOL TARTRATE 25 MG PO TABS
25.0000 mg | ORAL_TABLET | Freq: Two times a day (BID) | ORAL | Status: DC
  Administered 2016-01-08 – 2016-01-14 (×11): 25 mg via ORAL
  Filled 2016-01-08 (×13): qty 1

## 2016-01-08 MED ORDER — AMIODARONE HCL 200 MG PO TABS
200.0000 mg | ORAL_TABLET | Freq: Every day | ORAL | Status: AC
  Administered 2016-01-08 – 2016-01-10 (×3): 200 mg via ORAL
  Filled 2016-01-08 (×3): qty 1

## 2016-01-08 MED ORDER — FUROSEMIDE 10 MG/ML IJ SOLN
40.0000 mg | Freq: Four times a day (QID) | INTRAMUSCULAR | Status: AC
  Administered 2016-01-08 (×2): 40 mg via INTRAVENOUS
  Filled 2016-01-08 (×2): qty 4

## 2016-01-08 MED ORDER — INSULIN GLARGINE 100 UNIT/ML ~~LOC~~ SOLN
40.0000 [IU] | Freq: Every day | SUBCUTANEOUS | Status: DC
  Administered 2016-01-08 – 2016-01-10 (×3): 40 [IU] via SUBCUTANEOUS
  Filled 2016-01-08 (×3): qty 0.4

## 2016-01-08 MED ORDER — VANCOMYCIN HCL 10 G IV SOLR
1250.0000 mg | INTRAVENOUS | Status: AC
  Administered 2016-01-08 – 2016-01-12 (×5): 1250 mg via INTRAVENOUS
  Filled 2016-01-08 (×5): qty 1250

## 2016-01-08 MED ORDER — POTASSIUM CHLORIDE 20 MEQ/15ML (10%) PO SOLN
40.0000 meq | ORAL | Status: AC
  Administered 2016-01-08 (×2): 40 meq
  Filled 2016-01-08 (×3): qty 30

## 2016-01-08 NOTE — Progress Notes (Signed)
ANTICOAGULATION CONSULT NOTE  Pharmacy Consult for Heparin Indication: atrial fibrillation  Allergies  Allergen Reactions  . Gold-Containing Drug Products Other (See Comments)    Blisters   . Tape Dermatitis    Patient Measurements: Height: 5\' 10"  (177.8 cm) Weight: 245 lb 13 oz (111.5 kg) IBW/kg (Calculated) : 68.5  Vital Signs: Temp: 98 F (36.7 C) (09/19 1127) Temp Source: Oral (09/19 1127) BP: 124/55 (09/19 1140) Pulse Rate: 86 (09/19 1140)  Labs:  Recent Labs  01/06/16 0538  01/06/16 0948 01/06/16 1353 01/06/16 2146 01/07/16 0337 01/07/16 0855 01/07/16 2044 01/08/16 0456  HGB 10.8*  --   --   --   --  9.5*  --   --  9.3*  HCT 37.6  --   --   --   --  32.2*  --   --  30.8*  PLT 389  --   --   --   --  344  --   --  338  APTT  --   --  32  --  42*  --  49*  --   --   HEPARINUNFRC  --   < > 0.96*  --   --   --  0.24* 0.24* 0.38  CREATININE 1.22*  --   --   --   --  1.71*  --   --  1.45*  TROPONINI 0.08*  --  0.13* 0.03* <0.03  --   --   --   --   < > = values in this interval not displayed. Estimated Creatinine Clearance: 48.8 mL/min (by C-G formula based on SCr of 1.45 mg/dL (H)).  Assessment: 70 y.o. female with VDRF, h/o Afib (on Xarelto PTA now on hold). On heparin for afib while in the hospital. Hemoglobin lower today, at 9.3; PLTC stable. No bleeding noted. Previous level therapeutic after rate increase to 1700 units/hr.  Goal of Therapy:  Heparin Level 0.3-0.7 Monitor platelets by anticoagulation protocol: Yes   Plan:  Continue heparin 1700 units/hr Monitor H/H and  S/sx of bleeding Daily CBC, Heparin level  66, BS, PharmD Clinical Pharmacy Resident [phone] (Pager) 01/08/2016 12:12 PM

## 2016-01-08 NOTE — Progress Notes (Signed)
Pharmacy Antibiotic Note  [name] is a 70 y.o. female admitted on 01/06/2016 with HCAP. Today is day 4 of abx for treatment of HCAP. Afebrile with WBC down to 18.8. De-escalated antibiotics yesterday. Cultures returned with H. Influenzae, and now show staph aureus, as well. MRSA PCR is positive. Renal function improved today. Until sensitivities return, will add vancomycin back on. Last dose of vancomycin was 9/17.   Plan: Ceftriaxone 2g IV every 24 hours Vancomycin 1250mg  IV q24h Monitor c/s, clinical status, VT at ss as needed, and ability to de-escalate  Height: 5\' 10"  (177.8 cm) Weight: 245 lb 13 oz (111.5 kg) IBW/kg (Calculated) : 68.5  Temp (24hrs), Avg:98.9 F (37.2 C), Min:98 F (36.7 C), Max:100.5 F (38.1 C)   Recent Labs Lab 01/06/16 0538 01/06/16 0948 01/07/16 0337 01/08/16 0456  WBC 32.4*  --  22.1* 18.8*  CREATININE 1.22*  --  1.71* 1.45*  LATICACIDVEN  --  1.8  --   --     Estimated Creatinine Clearance: 48.8 mL/min (by C-G formula based on SCr of 1.45 mg/dL (H)).    Allergies  Allergen Reactions  . Gold-Containing Drug Products Other (See Comments)    Blisters   . Tape Dermatitis    Antimicrobials this admission:  9/17 Azith x1 9/17 CTX x1; 9/18>> 9/17 Vanc >>9/18, 9/19>> 9/17 10/17 >>9/18  Dose Adjustments This Admission: Ceftazidime 1g IV q8h >> Ceftazidime 1g IV q12h Vancomycin 1000 mg IV q12h >> Vancomycin 1250 mg IV q24h  Microbiology results:  9/17 BCx: Sent 9/17 Trach aspirate: Moderate H. Influenzae (beta-lactamase positive) and staph aureus. 9/17 MRSA PCR: positive  Imaging: 9/18 CXR: Bibasilar pneumonia cannot be excluded.   Thank you for allowing pharmacy to be a part of this patient's care.  10/17, 10/18, PharmD Clinical Pharmacy Resident [phone] (Pager) 01/08/2016 12:13 PM

## 2016-01-08 NOTE — Progress Notes (Signed)
PULMONARY / CRITICAL CARE MEDICINE   Name: [name] MRN: [id number] DOB: [birth year]    ADMISSION DATE:  01/06/2016 CONSULTATION DATE:  01/06/16  REFERRING MD:  EDP , Dr. Preston Fleeting   CHIEF COMPLAINT:  Resp Failure   HISTORY OF PRESENT ILLNESS:   70 yo female former smoker with known COPD and chronic resp failure on home O2 at 3-4 l./m with multiple comorbidities with DM, chronic anemia , chronic renal disease-stage III , cerebral aneurysm s/p coiling in 2009 , Afib with RVR 09/2015 s/p cardioversion and begin anticoagulation (xarelto) found lethargic at home by family. EMS brought to ER at [facility] found to be hypoxic and hypercarbic . Placed on BIPAP without sigificant improvement requiring intubation and vent support. ABG showed severe hypercarbia with PCO2 >100. Most recent admission was in June 2017 with similar presentation. Discharged to SNF and then to home. Dr. Tyson Alias conversation with family . Pt is a DNR .   SUBJECTIVE:  Remains on vent Yesterday she indicated that wanted to be re-intubated if necessary   VITAL SIGNS: BP (!) 138/44 (BP Location: Right Arm)   Pulse 87   Temp 98.1 F (36.7 C) (Oral)   Resp (!) 24   Ht 5\' 10"  (1.778 m)   Wt 111.5 kg (245 lb 13 oz)   SpO2 97%   BMI 35.27 kg/m   HEMODYNAMICS:    VENTILATOR SETTINGS: Vent Mode: PRVC FiO2 (%):  [40 %-50 %] 40 % Set Rate:  [24 bmp] 24 bmp Vt Set:  [500 mL] 500 mL PEEP:  [5 cmH20] 5 cmH20 Pressure Support:  [12 cmH20-14 cmH20] 12 cmH20 Plateau Pressure:  [24 cmH20-25 cmH20] 25 cmH20  INTAKE / OUTPUT: I/O last 3 completed shifts: In: 4132 [I.V.:3597.2; NG/GT:134.8; IV Piggyback:400] Out: 1384 [Urine:1384]  PHYSICAL EXAMINATION:  General: sedated comfortably on vent HENT: NCAT, ETT in place PULM: no wheezing today, normal effort with vent support CV: RRR, no mgr GI: BS+, soft, nontender MSK: no bony abnormal Neuro: awake on vent, following commands  LABS:  BMET  Recent Labs Lab 01/06/16 0538  01/07/16 0337 01/08/16 0456  NA 136 134* 143  K 4.2 3.1* 3.0*  CL 83* 84* 93*  CO2 41* 35* 34*  BUN 16 28* 39*  CREATININE 1.22* 1.71* 1.45*  GLUCOSE 231* 184* 135*    Electrolytes  Recent Labs Lab 01/06/16 0538  01/07/16 0337 01/07/16 1215 01/07/16 1626 01/08/16 0456  CALCIUM 9.3  --  8.8*  --   --  8.9  MG  --   < >  --  1.6* 1.5* 2.3  PHOS  --   < >  --  2.2* 2.3* 3.4  < > = values in this interval not displayed.  CBC  Recent Labs Lab 01/06/16 0538 01/07/16 0337 01/08/16 0456  WBC 32.4* 22.1* 18.8*  HGB 10.8* 9.5* 9.3*  HCT 37.6 32.2* 30.8*  PLT 389 344 338    Coag's  Recent Labs Lab 01/06/16 0948 01/06/16 2146 01/07/16 0855  APTT 32 42* 49*    Sepsis Markers  Recent Labs Lab 01/06/16 0948 01/07/16 1215 01/08/16 0456  LATICACIDVEN 1.8  --   --   PROCALCITON 1.43 0.95 0.82    ABG  Recent Labs Lab 01/06/16 0839 01/07/16 0419 01/07/16 1241  PHART 7.392 7.521* 7.437  PCO2ART 77.9* 48.1* 55.5*  PO2ART 84.0 83.2 79.0*    Liver Enzymes  Recent Labs Lab 01/06/16 0538  AST 15  ALT 13*  ALKPHOS 62  BILITOT 0.4  ALBUMIN 2.9*    Cardiac Enzymes  Recent Labs Lab 01/06/16 0948 01/06/16 1353 01/06/16 2146  TROPONINI 0.13* 0.03* <0.03    Glucose  Recent Labs Lab 01/08/16 0202 01/08/16 0302 01/08/16 0433 01/08/16 0522 01/08/16 0626 01/08/16 0750  GLUCAP 143* 150* 142* 147* 155* 140*    Imaging Dg Chest Port 1 View  Result Date: 01/08/2016 CLINICAL DATA:  Intubation. EXAM: PORTABLE CHEST 1 VIEW COMPARISON:  Nineteen 2017. FINDINGS: Endotracheal tube and NG tube in stable position. Cardiomegaly with pulmonary vascular prominence and bilateral pulmonary interstitial prominence increased from prior exam. Findings are consistent with worsening congestive heart failure. Bibasilar pneumonia cannot be excluded. Small right pleural effusion. No pneumothorax. IMPRESSION: 1.  Lines and tubes stable position. 2. Congestive heart  failure with progressive pulmonary interstitial edema and small right pleural effusion . Bibasilar pneumonia cannot be excluded. Electronically Signed   By: Maisie Fus  Register   On: 01/08/2016 07:45     STUDIES:  CXR 9/18 > progression of bibasilar infiltrates.   CULTURES: 9/17 BC x 2 >> 9/17 Sputum > Abundant gram negative coccobacilli >>> haemophilus, staph aureus strep > neg leg urine ag >>>  ANTIBIOTICS: 9/17 Ceftaz>> 9/17 Vanc >>   Azithromycin 9/17 only  SIGNIFICANT EVENTS: Admitted with hypercarbic/hypoxic RF on vent to ICU   LINES/TUBES: 9/17 ETT >>  DISCUSSION: 70 yo female with severe COPD on home O2 at 3-4l/m admitted with COPD exacerbation +HCAP w/ hypercarbic/hypoxic RF requiring intubation. Course complicated by AF RVR (of which she has history) and acute renal failure (hx CKD III).    ASSESSMENT / PLAN:  PULMONARY A: Acute on Chronic  Hypercarbic/Hypoxic Resp Failure with severe CAP and acute pulmonary edema COPD exacerbation (home symbicort/spiriva) Home O2 at 4l/m  ?OSA/OHS  not on CPAP at home   P:   Pressure support wean Continue solumedrol and bronchodilators Consider extubation today VAP prevention   CARDIOVASCULAR A:  HTN  Echo 01/2015 EF 55-60%, mild dilated LA  Atrial Fib (dx 09/2015 ) cardioversion 09/2015 , cerebral angiogram d/t prev aneurysm coliing 2009, no contraindication for anticoagualtion d/ch on xarelto  Echo 2016 > wnl P:  Telemetry monitoring Hep drip Continue dilt gtt Convert amio to po Continue metoprolol, convert to oral Diuresing today  RENAL A:   Acute on chronic Kidney disease (scr 09/2015 1.0 )  Hypokalemia  P:   Diurese now Follow chem panel  Continue K replacement and repeat later today Monitor urine output  GASTROINTESTINAL A:   Nutrition  P:   PPI  NPO  Continue tube feedings  HEMATOLOGIC A:   Anemia  P:  Monitor H/H on hep   INFECTIOUS A:   HCAP > Staph and haemophilus in sputum P:   Ceftaz  /Vanc Day 2  Pan Culture   ENDOCRINE A:   DM  P:   Convert insulin gtt to sub cutaneous insulin (glargine and regular)  NEUROLOGIC A:   Sedated on Vent  Unresponsive  P:   RASS goal: 0 to -1  PAD protocol w/ propofol /fent  Holding home trazodone  WUA now   FAMILY  - Updates: Family updated by Joneen Roach 9/18 > desires to continue with vent support for now  - Inter-disciplinary family meet or Palliative Care meeting due by:  9/24  My cc time 31 minutes  Heber Force, MD [place] [facility] Pager: [phone] Cell: [phone] After 3pm or if no response, call [phone]   01/08/2016 8:26 AM

## 2016-01-08 NOTE — Progress Notes (Signed)
Found VT 580, back to 500. PIP decrease with change.

## 2016-01-08 NOTE — Consult Note (Signed)
   [facility] CM Inpatient Consult   01/08/2016  TRACI GAFFORD [birth year] [id number]   Patient screened for potential [facility] Care Management services. Chart review reveals the patient is a 70 yo female former smoker with known COPD and chronic resp failure on home O2 at 3-4 l./m with multiple comorbidities with DM, chronic anemia , chronic renal disease-stage III , cerebral aneurysm s/p coiling in 2009 , Afib with RVR 09/2015 s/p cardioversion and begin anticoagulation (xarelto) found lethargic at home by family.  Placed on BIPAP without sigificant improvement requiring intubation and vent support. ABG showed severe hypercarbia with PCO2 >100. Most recent admission was in June 2017 with similar presentation. Discharged to SNF and then to home. Dr. Tyson Alias conversation with family . Pt is a DNR .  Patient remains on vent per notes.  Patient is eligible for [facility] Care Management services under patient's Medicare plan.   Will follow for progress questions contact:   Charlesetta Shanks, RN BSN CCM [facility]  [phone] business mobile phone Toll free office [phone]

## 2016-01-08 NOTE — Progress Notes (Signed)
eLink Physician-Brief Progress Note Patient Name: [name] DOB: 08/18/1945 MRN: [id number]   Date of Service  01/08/2016  HPI/Events of Note  K 3.0  eICU Interventions  repleted        [name] 01/08/2016, 7:05 AM

## 2016-01-09 ENCOUNTER — Inpatient Hospital Stay (HOSPITAL_COMMUNITY): Payer: Medicare Other

## 2016-01-09 DIAGNOSIS — J15212 Pneumonia due to Methicillin resistant Staphylococcus aureus: Secondary | ICD-10-CM

## 2016-01-09 LAB — CULTURE, RESPIRATORY

## 2016-01-09 LAB — GLUCOSE, CAPILLARY
GLUCOSE-CAPILLARY: 192 mg/dL — AB (ref 65–99)
GLUCOSE-CAPILLARY: 170 mg/dL — AB (ref 65–99)
Glucose-Capillary: 167 mg/dL — ABNORMAL HIGH (ref 65–99)
Glucose-Capillary: 205 mg/dL — ABNORMAL HIGH (ref 65–99)
Glucose-Capillary: 262 mg/dL — ABNORMAL HIGH (ref 65–99)
Glucose-Capillary: 297 mg/dL — ABNORMAL HIGH (ref 65–99)

## 2016-01-09 LAB — CULTURE, RESPIRATORY W GRAM STAIN

## 2016-01-09 LAB — CBC
HEMATOCRIT: 30.1 % — AB (ref 36.0–46.0)
HEMOGLOBIN: 9 g/dL — AB (ref 12.0–15.0)
MCH: 26.1 pg (ref 26.0–34.0)
MCHC: 29.9 g/dL — AB (ref 30.0–36.0)
MCV: 87.2 fL (ref 78.0–100.0)
Platelets: 331 10*3/uL (ref 150–400)
RBC: 3.45 MIL/uL — ABNORMAL LOW (ref 3.87–5.11)
RDW: 16.5 % — AB (ref 11.5–15.5)
WBC: 12.5 10*3/uL — ABNORMAL HIGH (ref 4.0–10.5)

## 2016-01-09 LAB — BASIC METABOLIC PANEL
ANION GAP: 10 (ref 5–15)
Anion gap: 10 (ref 5–15)
BUN: 50 mg/dL — AB (ref 6–20)
BUN: 47 mg/dL — ABNORMAL HIGH (ref 6–20)
CALCIUM: 8.8 mg/dL — AB (ref 8.9–10.3)
CHLORIDE: 92 mmol/L — AB (ref 101–111)
CHLORIDE: 93 mmol/L — AB (ref 101–111)
CO2: 37 mmol/L — AB (ref 22–32)
CO2: 39 mmol/L — AB (ref 22–32)
CREATININE: 1.36 mg/dL — AB (ref 0.44–1.00)
Calcium: 8.5 mg/dL — ABNORMAL LOW (ref 8.9–10.3)
Creatinine, Ser: 1.22 mg/dL — ABNORMAL HIGH (ref 0.44–1.00)
GFR calc Af Amer: 45 mL/min — ABNORMAL LOW (ref >60)
GFR calc Af Amer: 51 mL/min — ABNORMAL LOW (ref >60)
GFR calc non Af Amer: 38 mL/min — ABNORMAL LOW (ref >60)
GFR calc non Af Amer: 44 mL/min — ABNORMAL LOW (ref >60)
GLUCOSE: 277 mg/dL — AB (ref 65–99)
GLUCOSE: 204 mg/dL — AB (ref 65–99)
POTASSIUM: 3.5 mmol/L (ref 3.5–5.1)
Potassium: 4 mmol/L (ref 3.5–5.1)
SODIUM: 139 mmol/L (ref 135–145)
Sodium: 142 mmol/L (ref 135–145)

## 2016-01-09 LAB — HEPARIN LEVEL (UNFRACTIONATED): Heparin Unfractionated: 0.51 IU/mL (ref 0.30–0.70)

## 2016-01-09 MED ORDER — RIVAROXABAN 20 MG PO TABS
20.0000 mg | ORAL_TABLET | Freq: Every day | ORAL | Status: DC
  Filled 2016-01-09: qty 1

## 2016-01-09 MED ORDER — ORAL CARE MOUTH RINSE
15.0000 mL | Freq: Two times a day (BID) | OROMUCOSAL | Status: DC
  Administered 2016-01-09 (×3): 15 mL via OROMUCOSAL

## 2016-01-09 MED ORDER — FUROSEMIDE 10 MG/ML IJ SOLN
40.0000 mg | Freq: Four times a day (QID) | INTRAMUSCULAR | Status: AC
  Administered 2016-01-09 (×2): 40 mg via INTRAVENOUS
  Filled 2016-01-09 (×2): qty 4

## 2016-01-09 MED ORDER — DIPHENHYDRAMINE HCL 50 MG/ML IJ SOLN
12.5000 mg | Freq: Once | INTRAMUSCULAR | Status: AC
  Administered 2016-01-09: 12.5 mg via INTRAVENOUS
  Filled 2016-01-09: qty 1

## 2016-01-09 MED ORDER — HEPARIN (PORCINE) IN NACL 100-0.45 UNIT/ML-% IJ SOLN
1700.0000 [IU]/h | INTRAMUSCULAR | Status: DC
  Administered 2016-01-10: 1700 [IU]/h via INTRAVENOUS
  Filled 2016-01-09 (×3): qty 250

## 2016-01-09 MED ORDER — CHLORHEXIDINE GLUCONATE 0.12 % MT SOLN
15.0000 mL | Freq: Two times a day (BID) | OROMUCOSAL | Status: DC
  Administered 2016-01-09: 15 mL via OROMUCOSAL

## 2016-01-09 MED ORDER — METHYLPREDNISOLONE SODIUM SUCC 40 MG IJ SOLR
40.0000 mg | Freq: Every day | INTRAMUSCULAR | Status: DC
  Administered 2016-01-09: 40 mg via INTRAVENOUS
  Filled 2016-01-09 (×2): qty 1

## 2016-01-09 MED ORDER — FENTANYL CITRATE (PF) 100 MCG/2ML IJ SOLN: 12.5000 ug | INTRAMUSCULAR | Status: DC | PRN

## 2016-01-09 NOTE — Progress Notes (Signed)
Inpatient Diabetes Program Recommendations  AACE/ADA: New Consensus Statement on Inpatient Glycemic Control (2015)  Target Ranges:  Prepandial:   less than 140 mg/dL      Peak postprandial:   less than 180 mg/dL (1-2 hours)      Critically ill patients:  140 - 180 mg/dL   Lab Results  Component Value Date   GLUCAP 262 (H) 01/09/2016   HGBA1C 7.5 (H) 01/06/2016    Review of Glycemic Control Results for [name], [name] (MRN [id number]) as of 01/09/2016 10:19  Ref. Range 01/08/2016 17:11 01/08/2016 20:45 01/09/2016 00:02 01/09/2016 03:38 01/09/2016 08:41  Glucose-Capillary Latest Ref Range: 65 - 99 mg/dL 354 (H) 656 (H) 812 (H) 297 (H) 262 (H)   Diabetes history: DM2 Outpatient Diabetes medications: Lantus 5 units + Novolog correction 0-15 units q 4 hrs. Current orders for Inpatient glycemic control: Lantus 40 units + Novolog correction 0-15 units q 4 hrs.  Inpatient Diabetes Program Recommendations:  Noted elevated CBGS. Please consider adding Tubefeed coverage 2-3 units q 4 hrs along with Novolog correction while on steroids and hold if tubefeed stopped or held.  Thank you, [name]. [name], RN, MSN, CDE Inpatient Glycemic Control Team Team Pager [phone] (8am-5pm) 01/09/2016 10:23 AM

## 2016-01-09 NOTE — Progress Notes (Signed)
ANTICOAGULATION CONSULT NOTE - Follow Up Consult  Pharmacy Consult for Heparin Indication: atrial fibrillation  Allergies  Allergen Reactions  . Gold-Containing Drug Products Other (See Comments)    Blisters   . Tape Dermatitis    Patient Measurements: Height: 5\' 10"  (177.8 cm) Weight: 237 lb 14 oz (107.9 kg) IBW/kg (Calculated) : 68.5 Heparin Dosing Weight: 84.2 kg  Vital Signs: Temp: 98.2 F (36.8 C) (09/20 0845) Temp Source: Oral (09/20 0845) BP: 114/103 (09/20 0900) Pulse Rate: 76 (09/20 0900)  Labs:  Recent Labs  01/06/16 1353 01/06/16 2146  01/07/16 0337  01/07/16 0855 01/07/16 2044 01/08/16 0456 01/08/16 1445 01/09/16 0753  HGB  --   --   < > 9.5*  --   --   --  9.3*  --  9.0*  HCT  --   --   --  32.2*  --   --   --  30.8*  --  30.1*  PLT  --   --   --  344  --   --   --  338  --  331  APTT  --  42*  --   --   --  49*  --   --   --   --   HEPARINUNFRC  --   --   --   --   < > 0.24* 0.24* 0.38  --  0.51  CREATININE  --   --   < > 1.71*  --   --   --  1.45* 1.34* 1.36*  TROPONINI 0.03* <0.03  --   --   --   --   --   --   --   --   < > = values in this interval not displayed.  Estimated Creatinine Clearance: 51.2 mL/min (by C-G formula based on SCr of 1.36 mg/dL (H)).  Assessment: 30 yoF with VDRF, h/o Afib (on xarelto PTA - on hold). On heparin for afib while in the hospital. Hgb and PLTC stable. No s/sx of bleeding noted. Heparin level this am therapeutic (0.51). She is now extubated and can be transitioned back to PTA xarelto.  Goal of Therapy:  Heparin level 0.3-0.7   Plan:  Continue heparin 1700 units/hr until 1700 hours Start PTA Xarelto 20mg  daily with supper at 1700 hours Monitor CBC every 72 hours Monitor s/sx of bleeding  66, BS, PharmD Clinical Pharmacy Resident [phone] (Pager) 01/09/2016 10:11 AM

## 2016-01-09 NOTE — Progress Notes (Signed)
PULMONARY / CRITICAL CARE MEDICINE   Name: [name] MRN: [id number] DOB: 06-Dec-1945    ADMISSION DATE:  01/06/2016 CONSULTATION DATE:  01/06/16  REFERRING MD:  EDP , Dr. Preston Fleeting   CHIEF COMPLAINT:  Resp Failure   HISTORY OF PRESENT ILLNESS:   70 yo female former smoker with known COPD and chronic resp failure on home O2 at 3-4 l./m with multiple comorbidities with DM, chronic anemia , chronic renal disease-stage III , cerebral aneurysm s/p coiling in 2009 , Afib with RVR 09/2015 s/p cardioversion and begin anticoagulation (xarelto) found lethargic at home by family. EMS brought to ER at [facility] found to be hypoxic and hypercarbic . Placed on BIPAP without sigificant improvement requiring intubation and vent support. ABG showed severe hypercarbia with PCO2 >100. Most recent admission was in June 2017 with similar presentation. Discharged to SNF and then to home. Dr. Tyson Alias conversation with family . Pt is a DNR .   SUBJECTIVE:  Passing SBT Overall no acute events   VITAL SIGNS: BP (!) 107/47   Pulse 68   Temp 97.2 F (36.2 C) (Axillary)   Resp (!) 24   Ht 5\' 10"  (1.778 m)   Wt 107.9 kg (237 lb 14 oz)   SpO2 95%   BMI 34.13 kg/m   HEMODYNAMICS:    VENTILATOR SETTINGS: Vent Mode: PRVC FiO2 (%):  [50 %] 50 % Set Rate:  [24 bmp] 24 bmp Vt Set:  [500 mL] 500 mL PEEP:  [5 cmH20] 5 cmH20 Pressure Support:  [5 cmH20] 5 cmH20 Plateau Pressure:  [16 cmH20-25 cmH20] 16 cmH20  INTAKE / OUTPUT: I/O last 3 completed shifts: In: 4744.5 [I.V.:4214.5; NG/GT:230; IV Piggyback:300] Out: 5745 [Urine:5745]  PHYSICAL EXAMINATION:  General: sedated comfortably on vent HENT: NCAT, ETT in place PULM: no wheezing today, normal effort with vent support CV: RRR, no mgr GI: BS+, soft, nontender MSK: no bony abnormal Neuro: awake on vent, following commands  LABS:  BMET  Recent Labs Lab 01/07/16 0337 01/08/16 0456 01/08/16 1445  NA 134* 143 137  K 3.1* 3.0* 4.3  CL 84* 93* 92*   CO2 35* 34* 32  BUN 28* 39* 40*  CREATININE 1.71* 1.45* 1.34*  GLUCOSE 184* 135* 213*    Electrolytes  Recent Labs Lab 01/07/16 0337  01/07/16 1626 01/08/16 0456 01/08/16 1445  CALCIUM 8.8*  --   --  8.9 8.4*  MG  --   < > 1.5* 2.3 2.0  PHOS  --   < > 2.3* 3.4 4.1  < > = values in this interval not displayed.  CBC  Recent Labs Lab 01/06/16 0538 01/07/16 0337 01/08/16 0456  WBC 32.4* 22.1* 18.8*  HGB 10.8* 9.5* 9.3*  HCT 37.6 32.2* 30.8*  PLT 389 344 338    Coag's  Recent Labs Lab 01/06/16 0948 01/06/16 2146 01/07/16 0855  APTT 32 42* 49*    Sepsis Markers  Recent Labs Lab 01/06/16 0948 01/07/16 1215 01/08/16 0456  LATICACIDVEN 1.8  --   --   PROCALCITON 1.43 0.95 0.82    ABG  Recent Labs Lab 01/06/16 0839 01/07/16 0419 01/07/16 1241  PHART 7.392 7.521* 7.437  PCO2ART 77.9* 48.1* 55.5*  PO2ART 84.0 83.2 79.0*    Liver Enzymes  Recent Labs Lab 01/06/16 0538  AST 15  ALT 13*  ALKPHOS 62  BILITOT 0.4  ALBUMIN 2.9*    Cardiac Enzymes  Recent Labs Lab 01/06/16 0948 01/06/16 1353 01/06/16 2146  TROPONINI 0.13* 0.03* <0.03  Glucose  Recent Labs Lab 01/08/16 1027 01/08/16 1316 01/08/16 1711 01/08/16 2045 01/09/16 0002 01/09/16 0338  GLUCAP 124* 158* 217* 170* 205* 297*    Imaging Dg Chest Port 1 View  Result Date: 01/09/2016 CLINICAL DATA:  Hypoxia EXAM: PORTABLE CHEST 1 VIEW COMPARISON:  January 08, 2016 FINDINGS: Endotracheal tube tip is 4.2 cm above the carina. Nasogastric tube tip and side port are below the diaphragm. No pneumothorax. There is a small right pleural effusion. There is mild interstitial edema, stable. There is no appreciable airspace consolidation. Heart is borderline enlarged with mild pulmonary venous hypertension. No adenopathy evident. There is calcification in the aortic arch. IMPRESSION: Tube positions as described without pneumothorax. Persistent right pleural effusion with interstitial  edema consistent with a degree of congestive heart failure. No airspace consolidation. Stable cardiac silhouette. There is aortic atherosclerosis. Electronically Signed   By: Bretta Bang III M.D.   On: 01/09/2016 07:13     STUDIES:  CXR 9/18 > progression of bibasilar infiltrates.   CULTURES: 9/17 BC x 2 >> 9/17 Sputum > Abundant gram negative coccobacilli >>> haemophilus, MRSA strep > neg leg urine ag >>>  ANTIBIOTICS: 9/17 Ceftaz>>9/18 9/18 ceftriaxone >> 9/17 Vanc >>   Azithromycin 9/17 only  SIGNIFICANT EVENTS: Admitted with hypercarbic/hypoxic RF on vent to ICU   LINES/TUBES: 9/17 ETT >>9/20  DISCUSSION: 70 yo female with severe COPD on home O2 at 3-4l/m admitted with COPD exacerbation +HCAP w/ hypercarbic/hypoxic RF requiring intubation. Course complicated by AF RVR (of which she has history) and acute renal failure (hx CKD III).    ASSESSMENT / PLAN:  PULMONARY A: Acute on Chronic  Hypercarbic/Hypoxic Resp Failure with severe CAP and acute pulmonary edema COPD exacerbation (home symbicort/spiriva) Home O2 at 4l/m  ?OSA/OHS  not on CPAP at home   P:   Extubate Maintain bronchodilators Wean solumedrol Titrate O2 for O2 > 90%   CARDIOVASCULAR A:  HTN  Echo 01/2015 EF 55-60%, mild dilated LA  Atrial Fib (dx 09/2015 ) cardioversion 09/2015 , cerebral angiogram d/t prev aneurysm coliing 2009, no contraindication for anticoagualtion d/ch on xarelto  Echo 2016 > wnl P:  Telemetry monitoring  Hep drip > convert to Xarelto this evening Continue dilt gtt after extubation Continue amio to po Continue metoprolol oral Diuresing today again  RENAL A:   Acute on chronic Kidney disease (scr 09/2015 1.0 )  Hypokalemia  P:   Diurese now Follow chem panel  Continue K replacement and repeat later today Monitor urine output  GASTROINTESTINAL A:   Nutrition  P:   PPI  NPO  Four hours post extubation  HEMATOLOGIC A:   Anemia  P:  Monitor H/H on hep    INFECTIOUS A:   HCAP > MRSA and haemophilus in sputum P:   Ceftriaxone  Clement Husbands Day4 Pan Culture   ENDOCRINE A:   DM  P:   Convert insulin gtt to sub cutaneous insulin (glargine and regular)  NEUROLOGIC A:   Sedated on Vent  Unresponsive  P:   Stop sedation protocol   FAMILY  - Updates: Family updated by Joneen Roach 9/18 > desires to continue with vent support for now  - Inter-disciplinary family meet or Palliative Care meeting due by:  9/24  My cc time 32 minutes  Heber Stratford, MD [place] [facility]M Pager: [phone] Cell: [phone] After 3pm or if no response, call [phone]   01/09/2016 8:04 AM

## 2016-01-09 NOTE — Progress Notes (Signed)
ANTICOAGULATION CONSULT NOTE - Follow Up Consult  Pharmacy Consult for Heparin Indication: atrial fibrillation  Allergies  Allergen Reactions  . Gold-Containing Drug Products Other (See Comments)    Blisters   . Tape Dermatitis    Patient Measurements: Height: 5\' 10"  (177.8 cm) Weight: 237 lb 14 oz (107.9 kg) IBW/kg (Calculated) : 68.5 Heparin Dosing Weight: 84.2 kg  Vital Signs: Temp: 98.7 F (37.1 C) (09/20 1112) Temp Source: Oral (09/20 1112) BP: 121/50 (09/20 1400) Pulse Rate: 67 (09/20 1400)  Labs:  Recent Labs  01/06/16 2146  01/07/16 0337  01/07/16 0855 01/07/16 2044 01/08/16 0456 01/08/16 1445 01/09/16 0753  HGB  --   < > 9.5*  --   --   --  9.3*  --  9.0*  HCT  --   --  32.2*  --   --   --  30.8*  --  30.1*  PLT  --   --  344  --   --   --  338  --  331  APTT 42*  --   --   --  49*  --   --   --   --   HEPARINUNFRC  --   --   --   < > 0.24* 0.24* 0.38  --  0.51  CREATININE  --   < > 1.71*  --   --   --  1.45* 1.34* 1.36*  TROPONINI <0.03  --   --   --   --   --   --   --   --   < > = values in this interval not displayed.  Estimated Creatinine Clearance: 51.2 mL/min (by C-G formula based on SCr of 1.36 mg/dL (H)).  Assessment: 5 yoF with VDRF, h/o Afib on chronic Xarelto which has been held this admission on IV heparin per pharmacy.   Orders received today to transition back to Xarelto, however patient did NOT pass their swallow evaluation and is to now continue IV heparin therapy.  H/H is low and trending down - will watch. The platelets are within normal limits. Heparin level has been therapeutic on 1700 units/hr.   Goal of Therapy:  Heparin level 0.3-0.7   Plan:  Continue heparin 1700 units/hr Daily Heparin level and CBC while on therapy.  Monitor CBC every 72 hours Monitor s/sx of bleeding Follow-up ability to take orals and restart Xarelto  66, PharmD, BCPS Clinical Pharmacist [phone] 01/09/2016 2:54 PM

## 2016-01-09 NOTE — Care Management Note (Signed)
Case Management Note  Patient Details  Name: [name] MRN: [id number] Date of Birth: 06-23-1945  Subjective/Objective:    Pt admitted for Acute on Chronic  Hypercarbic/Hypoxic Resp Failure +/- PNA/fluid overload requiring vent support                 Action/Plan:  Pt coming from home ; previously discharged to SNF - however has been discharged from SNF to home.  Pt is on home O2 3-4 liters.  CM will inform CSW of tenative consult for discharge depending on pts progression.   Expected Discharge Date:                  Expected Discharge Plan:     In-House Referral:  Clinical Social Work  Discharge planning Services  CM Consult  Post Acute Care Choice:    Choice offered to:     DME Arranged:    DME Agency:     HH Arranged:    HH Agency:     Status of Service:  In process, will continue to follow  If discussed at Long Length of Stay Meetings, dates discussed:    Additional Comment Pt has been medically cleared for PT/OT eval - eval ordered Cherylann Parr, RN 01/09/2016, 1:36 PM

## 2016-01-09 NOTE — Progress Notes (Signed)
eLink Physician-Brief Progress Note Patient Name: [name] DOB: September 22, 1945 MRN: [id number]   Date of Service  01/09/2016  HPI/Events of Note  Patient requests sleeping aid. Patient is NPO.   eICU Interventions  Will order: 1. Benadryl 12.5 mg IV X 1 now.      Intervention Category Intermediate Interventions: Other:  Merna Baldi Dennard Nip 01/09/2016, 8:21 PM

## 2016-01-09 NOTE — Procedures (Signed)
Extubation Procedure Note  Patient Details:   Name: [name] DOB: [birth year] MRN: [id number]   Airway Documentation:     Evaluation  O2 sats: stable throughout Complications: No apparent complications Patient did tolerate procedure well. Bilateral Breath Sounds: Clear   Yes   Pt extubated to 5L [place] (wears 4L at home). Pt able to speak and has a strong productive cough post extubation. Pt stable throughout with no complications. Pt with clear/diminished BS throughout. RT will continue to monitor.   Ermalinda Barrios M 01/09/2016, 9:12 AM

## 2016-01-10 LAB — GLUCOSE, CAPILLARY
GLUCOSE-CAPILLARY: 194 mg/dL — AB (ref 65–99)
Glucose-Capillary: 118 mg/dL — ABNORMAL HIGH (ref 65–99)
Glucose-Capillary: 120 mg/dL — ABNORMAL HIGH (ref 65–99)
Glucose-Capillary: 129 mg/dL — ABNORMAL HIGH (ref 65–99)
Glucose-Capillary: 155 mg/dL — ABNORMAL HIGH (ref 65–99)
Glucose-Capillary: 172 mg/dL — ABNORMAL HIGH (ref 65–99)
Glucose-Capillary: 207 mg/dL — ABNORMAL HIGH (ref 65–99)

## 2016-01-10 LAB — CBC WITH DIFFERENTIAL/PLATELET
BASOS ABS: 0 10*3/uL (ref 0.0–0.1)
Basophils Relative: 0 %
EOS PCT: 0 %
Eosinophils Absolute: 0 10*3/uL (ref 0.0–0.7)
HEMATOCRIT: 32.5 % — AB (ref 36.0–46.0)
HEMOGLOBIN: 9.2 g/dL — AB (ref 12.0–15.0)
LYMPHS ABS: 1.6 10*3/uL (ref 0.7–4.0)
LYMPHS PCT: 11 %
MCH: 25.6 pg — ABNORMAL LOW (ref 26.0–34.0)
MCHC: 28.3 g/dL — ABNORMAL LOW (ref 30.0–36.0)
MCV: 90.5 fL (ref 78.0–100.0)
MONOS PCT: 8 %
Monocytes Absolute: 1.2 10*3/uL — ABNORMAL HIGH (ref 0.1–1.0)
NEUTROS PCT: 81 %
Neutro Abs: 11.9 10*3/uL — ABNORMAL HIGH (ref 1.7–7.7)
Platelets: 348 10*3/uL (ref 150–400)
RBC: 3.59 MIL/uL — AB (ref 3.87–5.11)
RDW: 16.3 % — ABNORMAL HIGH (ref 11.5–15.5)
WBC: 14.7 10*3/uL — AB (ref 4.0–10.5)

## 2016-01-10 LAB — BASIC METABOLIC PANEL
ANION GAP: 7 (ref 5–15)
BUN: 43 mg/dL — AB (ref 6–20)
CHLORIDE: 96 mmol/L — AB (ref 101–111)
CO2: 40 mmol/L — ABNORMAL HIGH (ref 22–32)
Calcium: 8.5 mg/dL — ABNORMAL LOW (ref 8.9–10.3)
Creatinine, Ser: 1.16 mg/dL — ABNORMAL HIGH (ref 0.44–1.00)
GFR calc Af Amer: 54 mL/min — ABNORMAL LOW (ref >60)
GFR, EST NON AFRICAN AMERICAN: 47 mL/min — AB (ref >60)
Glucose, Bld: 120 mg/dL — ABNORMAL HIGH (ref 65–99)
POTASSIUM: 3.6 mmol/L (ref 3.5–5.1)
SODIUM: 143 mmol/L (ref 135–145)

## 2016-01-10 LAB — HEPARIN LEVEL (UNFRACTIONATED): HEPARIN UNFRACTIONATED: 0.57 [IU]/mL (ref 0.30–0.70)

## 2016-01-10 MED ORDER — PREDNISONE 10 MG PO TABS
10.0000 mg | ORAL_TABLET | Freq: Every day | ORAL | Status: DC
Start: 2016-01-19 — End: 2016-01-10

## 2016-01-10 MED ORDER — PREDNISONE 20 MG PO TABS: 20.0000 mg | ORAL_TABLET | Freq: Every day | ORAL | Status: DC

## 2016-01-10 MED ORDER — PANTOPRAZOLE SODIUM 40 MG PO TBEC
40.0000 mg | DELAYED_RELEASE_TABLET | Freq: Every day | ORAL | Status: DC
  Administered 2016-01-10: 40 mg via ORAL
  Filled 2016-01-10: qty 1

## 2016-01-10 MED ORDER — RIVAROXABAN 20 MG PO TABS: 20.0000 mg | ORAL_TABLET | Freq: Every day | ORAL | Status: DC

## 2016-01-10 MED ORDER — PREDNISONE 10 MG PO TABS: 10.0000 mg | ORAL_TABLET | Freq: Every day | ORAL | Status: DC

## 2016-01-10 MED ORDER — TIOTROPIUM BROMIDE MONOHYDRATE 18 MCG IN CAPS
18.0000 ug | ORAL_CAPSULE | Freq: Every day | RESPIRATORY_TRACT | Status: DC
  Administered 2016-01-11 – 2016-01-14 (×4): 18 ug via RESPIRATORY_TRACT
  Filled 2016-01-10 (×2): qty 5

## 2016-01-10 MED ORDER — PREDNISONE 20 MG PO TABS: 30.0000 mg | ORAL_TABLET | Freq: Every day | ORAL | Status: DC

## 2016-01-10 MED ORDER — MOMETASONE FURO-FORMOTEROL FUM 100-5 MCG/ACT IN AERO
2.0000 | INHALATION_SPRAY | Freq: Two times a day (BID) | RESPIRATORY_TRACT | Status: DC
  Administered 2016-01-10 – 2016-01-14 (×8): 2 via RESPIRATORY_TRACT
  Filled 2016-01-10 (×2): qty 8.8

## 2016-01-10 MED ORDER — PREDNISONE 10 MG PO TABS: 40.0000 mg | ORAL_TABLET | Freq: Every day | ORAL | Status: DC

## 2016-01-10 MED ORDER — DILTIAZEM HCL ER 60 MG PO CP12
120.0000 mg | ORAL_CAPSULE | Freq: Two times a day (BID) | ORAL | Status: DC
  Administered 2016-01-10 – 2016-01-14 (×9): 120 mg via ORAL
  Filled 2016-01-10 (×9): qty 2

## 2016-01-10 MED ORDER — PREDNISONE 20 MG PO TABS
30.0000 mg | ORAL_TABLET | Freq: Every day | ORAL | Status: DC
  Administered 2016-01-13 – 2016-01-14 (×2): 30 mg via ORAL
  Filled 2016-01-10 (×2): qty 1

## 2016-01-10 MED ORDER — PREDNISONE 20 MG PO TABS
40.0000 mg | ORAL_TABLET | Freq: Every day | ORAL | Status: AC
  Administered 2016-01-10 – 2016-01-12 (×3): 40 mg via ORAL
  Filled 2016-01-10 (×4): qty 2

## 2016-01-10 MED ORDER — RIVAROXABAN 20 MG PO TABS
20.0000 mg | ORAL_TABLET | Freq: Every day | ORAL | Status: DC
  Administered 2016-01-10 – 2016-01-13 (×4): 20 mg via ORAL
  Filled 2016-01-10 (×4): qty 1

## 2016-01-10 MED ORDER — ALBUTEROL SULFATE (2.5 MG/3ML) 0.083% IN NEBU
2.5000 mg | INHALATION_SOLUTION | RESPIRATORY_TRACT | Status: DC | PRN
  Administered 2016-01-10 – 2016-01-13 (×4): 2.5 mg via RESPIRATORY_TRACT
  Filled 2016-01-10 (×4): qty 3

## 2016-01-10 NOTE — Progress Notes (Signed)
PULMONARY / CRITICAL CARE MEDICINE   Name: [name] MRN: [id number] DOB: June 07, 1945    ADMISSION DATE:  01/06/2016 CONSULTATION DATE:  01/06/16  REFERRING MD:  EDP , Dr. Preston Fleeting   CHIEF COMPLAINT:  Resp Failure   HISTORY OF PRESENT ILLNESS:   70 yo female former smoker with known COPD and chronic resp failure on home O2 at 3-4 l./m with multiple comorbidities with DM, chronic anemia , chronic renal disease-stage III , cerebral aneurysm s/p coiling in 2009 , Afib with RVR 09/2015 s/p cardioversion and begin anticoagulation (xarelto) found lethargic at home by family. EMS brought to ER at [facility] found to be hypoxic and hypercarbic . Placed on BIPAP without sigificant improvement requiring intubation and vent support. ABG showed severe hypercarbia with PCO2 >100. Most recent admission was in June 2017 with similar presentation. Discharged to SNF and then to home. Dr. Tyson Alias conversation with family . Pt is a DNR .   SUBJECTIVE:  Extubated Did well Didn't pass swalllow   VITAL SIGNS: BP (!) 130/52 (BP Location: Right Arm)   Pulse 63   Temp 97.6 F (36.4 C) (Oral)   Resp 17   Ht 5\' 10"  (1.778 m)   Wt 109 kg (240 lb 4.8 oz)   SpO2 92%   BMI 34.48 kg/m   HEMODYNAMICS:    VENTILATOR SETTINGS: Vent Mode: BIPAP FiO2 (%):  [40 %-100 %] 55 % Set Rate:  [15 bmp] 15 bmp  INTAKE / OUTPUT: I/O last 3 completed shifts: In: 4329 [I.V.:3849; NG/GT:230; IV Piggyback:250] Out: 6350 [Urine:6350]  PHYSICAL EXAMINATION:  General: sitting up in chair HENT: NCAT, OP clear PULM: no wheezing today, normal effort CV: RRR, no mgr GI: BS+, soft, nontender MSK: no bony abnormal Neuro: awake, following commands  LABS:  BMET  Recent Labs Lab 01/09/16 0753 01/09/16 1647 01/10/16 0211  NA 139 142 143  K 3.5 4.0 3.6  CL 92* 93* 96*  CO2 37* 39* 40*  BUN 50* 47* 43*  CREATININE 1.36* 1.22* 1.16*  GLUCOSE 277* 204* 120*    Electrolytes  Recent Labs Lab 01/07/16 1626  01/08/16 0456 01/08/16 1445 01/09/16 0753 01/09/16 1647 01/10/16 0211  CALCIUM  --  8.9 8.4* 8.5* 8.8* 8.5*  MG 1.5* 2.3 2.0  --   --   --   PHOS 2.3* 3.4 4.1  --   --   --     CBC  Recent Labs Lab 01/08/16 0456 01/09/16 0753 01/10/16 0211  WBC 18.8* 12.5* 14.7*  HGB 9.3* 9.0* 9.2*  HCT 30.8* 30.1* 32.5*  PLT 338 331 348    Coag's  Recent Labs Lab 01/06/16 0948 01/06/16 2146 01/07/16 0855  APTT 32 42* 49*    Sepsis Markers  Recent Labs Lab 01/06/16 0948 01/07/16 1215 01/08/16 0456  LATICACIDVEN 1.8  --   --   PROCALCITON 1.43 0.95 0.82    ABG  Recent Labs Lab 01/06/16 0839 01/07/16 0419 01/07/16 1241  PHART 7.392 7.521* 7.437  PCO2ART 77.9* 48.1* 55.5*  PO2ART 84.0 83.2 79.0*    Liver Enzymes  Recent Labs Lab 01/06/16 0538  AST 15  ALT 13*  ALKPHOS 62  BILITOT 0.4  ALBUMIN 2.9*    Cardiac Enzymes  Recent Labs Lab 01/06/16 0948 01/06/16 1353 01/06/16 2146  TROPONINI 0.13* 0.03* <0.03    Glucose  Recent Labs Lab 01/09/16 1103 01/09/16 1535 01/09/16 2008 01/09/16 2359 01/10/16 0357 01/10/16 0743  GLUCAP 167* 192* 170* 129* 120* 118*    Imaging  No results found.   STUDIES:  CXR 9/18 > progression of bibasilar infiltrates  CULTURES: 9/17 BC x 2 >> 9/17 Sputum > Abundant gram negative coccobacilli >>> haemophilus, MRSA strep > neg leg urine ag >>>  ANTIBIOTICS: 9/17 Ceftaz>>9/18 9/18 ceftriaxone >> 7 days 9/17 Vanc >>  7 days Azithromycin 9/17 only  SIGNIFICANT EVENTS: Admitted with hypercarbic/hypoxic RF on vent to ICU   LINES/TUBES: 9/17 ETT >>9/20  DISCUSSION: 70 yo female with severe COPD on home O2 at 3-4l/m admitted with COPD exacerbation +HCAP w/ hypercarbic/hypoxic RF requiring intubation. Course complicated by AF RVR (of which she has history) and acute renal failure (hx CKD III).   Doing much better as of 9/21.  ASSESSMENT / PLAN:  PULMONARY A: Acute on Chronic  Hypercarbic/Hypoxic Resp  Failure with severe CAP and acute pulmonary edema > resolved COPD exacerbation (home symbicort/spiriva) Home O2 at 4l/m > resolved ?OSA/OHS  not on CPAP at home   P:   Restart Spiriva and Dulera in hospita (OK to use symbicort at home again) Albuterol prn Prednisone taper Titrate O2 for O2 > 90%   CARDIOVASCULAR A:  HTN  Echo 01/2015 EF 55-60%, mild dilated LA  Atrial Fib (dx 09/2015 ) cardioversion 09/2015 , cerebral angiogram d/t prev aneurysm coliing 2009, no contraindication for anticoagualtion d/ch on xarelto  Echo 2016 > wnl P:  Telemetry monitoring  Hep drip > convert to Xarelto this evening Change dilt to oral Amio stop today Change hep to oral anticoagulant Continue metoprolol oral Diuresing today again  RENAL A:   Acute on chronic Kidney disease (scr 09/2015 1.0 )  Hypokalemia  P:   Monitor BMET and UOP Replace electrolytes as needed  GASTROINTESTINAL A:   Nutrition  P:   PPI  SLP evaluation  HEMATOLOGIC A:   Anemia  P:  Monitor H/H on hep   INFECTIOUS A:   HCAP > MRSA and haemophilus in sputum P:   Ceftriaxone  /Vanc Day5 > plan 7 days Pan Culture   ENDOCRINE A:   DM  P:   Regular insulin SSI here Consider restarting glargine  NEUROLOGIC A:   No acute issues Deconditioning P:   PT consult   FAMILY  - Updates: Family updated by Joneen Roach 9/18 > desires to continue with vent support for now  - Inter-disciplinary family meet or Palliative Care meeting due by:  9/24  Out of ICU today to tele  Heber Rahway, MD [facility] Pager: [phone] Cell: [phone] After 3pm or if no response, call [phone]   01/10/2016 9:06 AM

## 2016-01-10 NOTE — Progress Notes (Signed)
Patient resting comfortably on 5L [place] with O2 Sat 94% and no respiratory distress noted. BIPAP not needed at this time. RT will continue to monitor as needed.

## 2016-01-10 NOTE — Evaluation (Signed)
Occupational Therapy Evaluation Patient Details Name: [name] MRN: [id number] DOB: [birth year] Today's Date: 01/10/2016    History of Present Illness 70 yo admitted wafter being found lethargic at home and hypoxic respiratory failure. Intubated 9/17-9/20. PMHx: COPD, DM, anemia, CKD, AFib   Clinical Impression   Pt was performing self care at a supervision level prior to admission. Presents with poor activity tolerance, decreased balance and generalized weakness interfering with mobility and ability to participate maximally in ADL. Pt with increased anxiety with transfers. Pt is adamant she does not want SNF level rehab, recommending CIR prior to return home. Will follow.    Follow Up Recommendations  CIR;Supervision/Assistance - 24 hour    Equipment Recommendations  None recommended by OT    Recommendations for Other Services       Precautions / Restrictions Precautions Precautions: Fall Precaution Comments: watch sats      Mobility Bed Mobility Pt on BSC.  Transfers Overall transfer level: Needs assistance Equipment used: Rolling walker (2 wheeled) Transfers: Sit to/from [employer] Sit to Stand: Min assist Stand pivot transfers: Min assist       General transfer comment: min assist from [facility], cues for hand placement    Balance Overall balance assessment: Needs assistance   Sitting balance-Leahy Scale: Fair       Standing balance-Leahy Scale: Poor                              ADL Overall ADL's : Needs assistance/impaired Eating/Feeding: Independent;Sitting   Grooming: Wash/dry hands;Sitting;Set up   Upper Body Bathing: Minimal assitance;Sitting   Lower Body Bathing: Moderate assistance;Sit to/from stand   Upper Body Dressing : Minimal assistance;Sitting   Lower Body Dressing: Moderate assistance;Sit to/from stand   Toilet Transfer: Minimal assistance;Stand-pivot;RW;BSC   Toileting- Clothing Manipulation and  Hygiene: Total assistance;Sit to/from stand         General ADL Comments: Pt with anxiety with mobility, becomes SOB and fatigues quickly.     Vision     Perception     Praxis      Pertinent Vitals/Pain Pain Assessment: No/denies pain     Hand Dominance Left   Extremity/Trunk Assessment Upper Extremity Assessment Upper Extremity Assessment: Generalized weakness   Lower Extremity Assessment Lower Extremity Assessment: Generalized weakness   Cervical / Trunk Assessment Cervical / Trunk Assessment: Kyphotic   Communication Communication Communication: No difficulties   Cognition Arousal/Alertness: Awake/alert Behavior During Therapy: Anxious Overall Cognitive Status: Within Functional Limits for tasks assessed                     General Comments       Exercises       Shoulder Instructions      Home Living Family/patient expects to be discharged to:: Private residence Living Arrangements: Other relatives (sister) Available Help at Discharge: Family;Available 24 hours/day Type of Home: House Home Access: Stairs to enter [employer] of Steps: 2 Entrance Stairs-Rails: Right;Left Home Layout: Two level;Able to live on main level with bedroom/bathroom;1/2 bath on main level Alternate Level Stairs-Number of Steps: flight Alternate Level Stairs-Rails: Right;Left     Bathroom Toilet: Standard     Home Equipment: Walker - 4 wheels;Bedside commode;Cane - single point   Additional Comments: sister is currently staying with her      Prior Functioning/Environment Level of Independence: Independent with assistive device(s)        Comments: sleeps on  couch, sponge bathes and washes hair over sink        OT Problem List: Decreased strength;Decreased activity tolerance;Impaired balance (sitting and/or standing);Decreased knowledge of use of DME or AE;Cardiopulmonary status limiting activity;Obesity;Decreased coordination;Impaired UE  functional use   OT Treatment/Interventions: Self-care/ADL training;DME and/or AE instruction;Energy conservation;Therapeutic activities;Patient/family education;Balance training    OT Goals(Current goals can be found in the care plan section) Acute Rehab OT Goals Patient Stated Goal: return home OT Goal Formulation: With patient Time For Goal Achievement: 01/24/16 Potential to Achieve Goals: Good ADL Goals Pt Will Perform Grooming: with supervision;standing (2 activities) Pt Will Perform Upper Body Bathing: with supervision;sitting Pt Will Perform Lower Body Bathing: with supervision;sit to/from stand Pt Will Perform Upper Body Dressing: with supervision;sitting Pt Will Perform Lower Body Dressing: with supervision;sit to/from stand Pt Will Transfer to Toilet: with supervision;ambulating;bedside commode (over toilet) Pt Will Perform Toileting - Clothing Manipulation and hygiene: with supervision;sit to/from stand Additional ADL Goal #1: Pt will employ energy conservation and pursed lip breathing techniques in ADL and mobility independently.  OT Frequency: Min 2X/week   Barriers to D/C:            Co-evaluation              End of Session Equipment Utilized During Treatment: Gait belt;Rolling walker;Oxygen  Activity Tolerance: Patient limited by fatigue Patient left: in chair;with call bell/phone within reach   Time: 1034-1110 OT Time Calculation (min): 36 min Charges:  OT General Charges $OT Visit: 1 Procedure OT Evaluation $OT Eval Moderate Complexity: 1 Procedure OT Treatments $Self Care/Home Management : 8-22 mins G-Codes:    Evern Bio 01/10/2016, 11:33 AM  [phone]

## 2016-01-10 NOTE — Progress Notes (Signed)
Rehab Admissions Coordinator Note:  Patient was screened by Trish Mage for appropriateness for an Inpatient Acute Rehab Consult.  Noted PT recommending possible need for CIR. May want to consider ordering inpatient rehab consult.  Trish Mage 01/10/2016, 10:26 AM  I can be reached at [phone].

## 2016-01-10 NOTE — Evaluation (Signed)
Clinical/Bedside Swallow Evaluation Patient Details  Name: [name] MRN: [id number] Date of Birth: August 09, 1945  Today's Date: 01/10/2016 Time: SLP Start Time (ACUTE ONLY): 7846 SLP Stop Time (ACUTE ONLY): 0943 SLP Time Calculation (min) (ACUTE ONLY): 16 min  Past Medical History:  Past Medical History:  Diagnosis Date  . Acute respiratory failure (HCC) 02/05/2017  . Adrenal disorder, other    "adrenal Incidentaloma"  . Anemia   . Aneurysm (HCC)    x2  . Arthritis   . Asthma   . Atrial fibrillation (HCC)   . 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 Bronchitis    history of  . Cerebral aneurysm   . Complication of anesthesia    Has woken up during surgery before  . COPD (chronic obstructive pulmonary disease) (HCC)    Dr. [name], uses 2.5L of o2 as needed  . Depression    due to loss of spouse 6 years ago  . Diabetes mellitus    metformin  . Emphysema of lung (HCC)    requested anethesia consult   . GERD (gastroesophageal reflux disease)    omeprazole  . Hypertension   . Obesity   . Rheumatoid arthritis(714.0)    has had for approx. 40years, sees Dr. [name]  . Shortness of breath   . Sleep apnea    does not use cpap  . Urinary tract infection    history of   Past Surgical History:  Past Surgical History:  Procedure Laterality Date  . BRAIN SURGERY     coiling and stenting  . BREAST SURGERY  02/28/11   right partial mastectomy   . CARDIOVERSION N/A 09/29/2015   Procedure: CARDIOVERSION;  Surgeon: [name], MD;  Location: [facility] OR;  Service: Cardiovascular;  Laterality: N/A;  . HAND SURGERY     bilateral  . HAND SURGERY     x 2  . MASTECTOMY, PARTIAL  02/28/2011   Procedure: MASTECTOMY PARTIAL;  Surgeon: Ernestene Mention, MD;  Location: [facility] OR;  Service: General;  Laterality: Right;  RIGHT PARTIAL MASTECTOMY  WITH NEEDLE LOCALIZATION  . TUBAL LIGATION     HPI:  70 yo female with severe COPD on home O2 at  3-4l/m admitted with COPD exacerbation +HCAP w/ hypercarbic/hypoxic RF requiring intubation 9/17-9/20. Course complicated by AF RVR (of which she has history) and acute renal failure (hx CKD III).    Assessment / Plan / Recommendation Clinical Impression  Pt had throat clearing following initial cup sip of thin liquid. SLP provided cueing for more effortful cough, which was productive at clearing secretions. Upon additional trials with challenging, no further overt s/s of aspiration were noted across consistencies. Her voice is mildly rough in quality today, but she does have good vocal intensity. Recommend Dys 3 diet and thin liquids but as an added precaution would not use straws and would administer medications whole in puree. Will f/u to assess for tolerance and readiness to advance.    Aspiration Risk  Mild aspiration risk    Diet Recommendation Dysphagia 3 (Mech soft);Thin liquid   Liquid Administration via: Cup;No straw Medication Administration: Whole meds with puree Supervision: Patient able to self feed;Full supervision/cueing for compensatory strategies Compensations: Slow rate;Small sips/bites Postural Changes: Seated upright at 90 degrees    Other  Recommendations Oral Care Recommendations: Oral care BID   Follow up Recommendations  (tba)      Frequency and Duration min  2x/week  2 weeks       Prognosis Prognosis for Safe Diet Advancement: Good      Swallow Study   General HPI: 70 yo female with severe COPD on home O2 at 3-4l/m admitted with COPD exacerbation +HCAP w/ hypercarbic/hypoxic RF requiring intubation 9/17-9/20. Course complicated by AF RVR (of which she has history) and acute renal failure (hx CKD III).  Type of Study: Bedside Swallow Evaluation Previous Swallow Assessment: none in chart Diet Prior to this Study: NPO Temperature Spikes Noted: No Respiratory Status: Nasal cannula History of Recent Intubation: Yes Length of Intubations (days): 3 days Date  extubated: 01/09/16 Behavior/Cognition: Alert;Cooperative;Pleasant mood Oral Cavity Assessment: Within Functional Limits Oral Care Completed by SLP: No Oral Cavity - Dentition: Adequate natural dentition Vision: Functional for self-feeding Self-Feeding Abilities: Able to feed self Patient Positioning: Upright in chair Baseline Vocal Quality: Other (comment) (mildly rough) Volitional Cough: Congested    Oral/Motor/Sensory Function Overall Oral Motor/Sensory Function: Within functional limits   Ice Chips Ice chips: Not tested   Thin Liquid Thin Liquid: Impaired Presentation: Cup;Self Fed Pharyngeal  Phase Impairments: Suspected delayed Swallow;Throat Clearing - Immediate    Nectar Thick Nectar Thick Liquid: Not tested   Honey Thick Honey Thick Liquid: Not tested   Puree Puree: Within functional limits Presentation: Self Fed;Spoon   Solid   GO   Solid: Within functional limits Presentation: Self Georjean Mode 01/10/2016,11:58 AM  Maxcine Ham, M.A. CCC-SLP [phone]

## 2016-01-10 NOTE — Progress Notes (Signed)
ANTICOAGULATION CONSULT NOTE - Follow Up Consult  Pharmacy Consult for Heparin Indication: atrial fibrillation  Allergies  Allergen Reactions  . Gold-Containing Drug Products Other (See Comments)    Blisters   . Tape Dermatitis    Patient Measurements: Height: 5\' 10"  (177.8 cm) Weight: 240 lb 4.8 oz (109 kg) IBW/kg (Calculated) : 68.5 Heparin Dosing Weight: 84.2 kg  Vital Signs: Temp: 98.5 F (36.9 C) (09/21 1143) Temp Source: Oral (09/21 1143) BP: 118/93 (09/21 1100) Pulse Rate: 63 (09/21 1100)  Labs:  Recent Labs  01/08/16 0456  01/09/16 0753 01/09/16 1647 01/10/16 0211  HGB 9.3*  --  9.0*  --  9.2*  HCT 30.8*  --  30.1*  --  32.5*  PLT 338  --  331  --  348  HEPARINUNFRC 0.38  --  0.51  --  0.57  CREATININE 1.45*  < > 1.36* 1.22* 1.16*  < > = values in this interval not displayed.  Estimated Creatinine Clearance: 60.3 mL/min (by C-G formula based on SCr of 1.16 mg/dL (H)).  Assessment: 65 yoF with VDRF, h/o Afib (on xarelto PTA - on hold). On heparin for afib while in the hospital. Hgb and PLTC stable. No s/sx of bleeding noted. Heparin level this am therapeutic (0.57). She is now extubated, has passed her swallow evaluation, and can be transitioned back to PTA xarelto.  Goal of Therapy:  Heparin level 0.3-0.7   Plan:  Continue heparin 1700 units/hr until 1030 Start PTA Xarelto 20mg  daily at 1030 Monitor CBC every 72 hours Monitor s/sx of bleeding  66, BS, PharmD Clinical Pharmacy Resident [phone] (Pager) 01/10/2016 12:00 PM

## 2016-01-10 NOTE — Evaluation (Addendum)
Physical Therapy Evaluation Patient Details Name: [name] MRN: [id number] DOB: [birth year] Today's Date: 01/10/2016   History of Present Illness  70 yo admitted wafter being found lethargic at home and hypoxic respiratory failure. Intubated 9/17-9/20. PMHx: COPD, DM, anemia, CKD, AFib  Clinical Impression  Pt pleasant but very eager to have something to drink and explained need for SLP to clear pt first. Pt with generalized weakness with assist to perform all transfers and sats dropping to 80% with limited pivot transfer on 14L venti mask at 55% FiO2. With 2 min seated rest and slow deep breathing able to recover to 90%. Pt educated for transfers, breathing, function, RW use and D/C needs. Pt with decreased strength, function, mobility, gait and pulmonary status who will benefit from acute therapy to maximize mobility and independence to decrease burden of care. Pt [facility] to [facility] and is very clear in her desire to return home with family assist and not to ST-SNF.     Follow Up Recommendations Supervision/Assistance - 24 hour;CIR    Equipment Recommendations  None recommended by PT    Recommendations for Other Services OT consult     Precautions / Restrictions Precautions Precautions: Fall Precaution Comments: watch sats      Mobility  Bed Mobility Overal bed mobility: Needs Assistance Bed Mobility: Supine to Sit     Supine to sit: Min assist;HOB elevated     General bed mobility comments: cues for sequence with assist to elevate trunk. Pt with HOB 20degrees use of rails and increased time  Transfers Overall transfer level: Needs assistance   Transfers: Sit to/from Stand;Stand Pivot Transfers Sit to Stand: From elevated surface;Mod assist Stand pivot transfers: Min assist       General transfer comment: Pt able to stand from low bed with min assist but knees buckling and returned to sitting, from elevated bed able to achieve full standing  and maintain, assist and cues to control descent, shift weight and perform standing marching with RW prior to pivot to chair. Assist to rise from surface, assist to control Rw and pelvis for transition to chair  Ambulation/Gait                Stairs            Wheelchair Mobility    Modified Rankin (Stroke Patients Only)       Balance Overall balance assessment: Needs assistance   Sitting balance-Leahy Scale: Fair       Standing balance-Leahy Scale: Poor                               Pertinent Vitals/Pain Pain Assessment: No/denies pain    Home Living Family/patient expects to be discharged to:: Private residence Living Arrangements: Other relatives Available Help at Discharge: Family;Available 24 hours/day Type of Home: House Home Access: Stairs to enter Entrance Stairs-Rails: Doctor, general practice of Steps: 2 Home Layout: Two level;Able to live on main level with bedroom/bathroom;1/2 bath on main level Home Equipment: Walker - 4 wheels;Bedside commode;Cane - single point Additional Comments: sister is currently staying with her    Prior Function Level of Independence: Independent with assistive device(s)               Hand Dominance        Extremity/Trunk Assessment   Upper Extremity Assessment: Generalized weakness           Lower Extremity Assessment:  Generalized weakness      Cervical / Trunk Assessment: Kyphotic  Communication   Communication: No difficulties  Cognition Arousal/Alertness: Awake/alert Behavior During Therapy: WFL for tasks assessed/performed Overall Cognitive Status: Within Functional Limits for tasks assessed                      General Comments      Exercises     Assessment/Plan    PT Assessment Patient needs continued PT services  PT Problem List Decreased strength;Decreased mobility;Decreased activity tolerance;Decreased balance;Decreased knowledge of use of DME           PT Treatment Interventions Gait training;Functional mobility training;Stair training;Patient/family education;Therapeutic exercise;DME instruction;Therapeutic activities    PT Goals (Current goals can be found in the Care Plan section)  Acute Rehab PT Goals Patient Stated Goal: return home PT Goal Formulation: With patient Time For Goal Achievement: 01/24/16 Potential to Achieve Goals: Good    Frequency Min 3X/week   Barriers to discharge Decreased caregiver support      Co-evaluation               End of Session Equipment Utilized During Treatment: Gait belt;Oxygen Activity Tolerance: Patient tolerated treatment well Patient left: in chair;with call bell/phone within reach;with nursing/sitter in room Nurse Communication: Mobility status;Precautions         Time: 3754-3606 PT Time Calculation (min) (ACUTE ONLY): 29 min   Charges:   PT Evaluation $PT Eval Moderate Complexity: 1 Procedure PT Treatments $Therapeutic Activity: 8-22 mins   PT G Codes:        Delorse Lek 01/10/2016, 8:35 AM Delaney Meigs, PT [phone]

## 2016-01-11 ENCOUNTER — Other Ambulatory Visit: Payer: Self-pay

## 2016-01-11 DIAGNOSIS — R5381 Other malaise: Secondary | ICD-10-CM

## 2016-01-11 DIAGNOSIS — R262 Difficulty in walking, not elsewhere classified: Secondary | ICD-10-CM

## 2016-01-11 DIAGNOSIS — I482 Chronic atrial fibrillation: Secondary | ICD-10-CM

## 2016-01-11 LAB — GLUCOSE, CAPILLARY
GLUCOSE-CAPILLARY: 63 mg/dL — AB (ref 65–99)
GLUCOSE-CAPILLARY: 136 mg/dL — AB (ref 65–99)
GLUCOSE-CAPILLARY: 161 mg/dL — AB (ref 65–99)
GLUCOSE-CAPILLARY: 160 mg/dL — AB (ref 65–99)
Glucose-Capillary: 85 mg/dL (ref 65–99)

## 2016-01-11 LAB — CULTURE, BLOOD (ROUTINE X 2)
CULTURE: NO GROWTH
Culture: NO GROWTH

## 2016-01-11 LAB — BASIC METABOLIC PANEL
ANION GAP: 6 (ref 5–15)
BUN: 34 mg/dL — ABNORMAL HIGH (ref 6–20)
CALCIUM: 8.9 mg/dL (ref 8.9–10.3)
CHLORIDE: 94 mmol/L — AB (ref 101–111)
CO2: 40 mmol/L — AB (ref 22–32)
Creatinine, Ser: 1.13 mg/dL — ABNORMAL HIGH (ref 0.44–1.00)
GFR calc Af Amer: 56 mL/min — ABNORMAL LOW (ref >60)
GFR calc non Af Amer: 48 mL/min — ABNORMAL LOW (ref >60)
GLUCOSE: 90 mg/dL (ref 65–99)
Potassium: 3.6 mmol/L (ref 3.5–5.1)
Sodium: 140 mmol/L (ref 135–145)

## 2016-01-11 LAB — CBC
HEMATOCRIT: 35.3 % — AB (ref 36.0–46.0)
Hemoglobin: 9.8 g/dL — ABNORMAL LOW (ref 12.0–15.0)
MCH: 25.6 pg — ABNORMAL LOW (ref 26.0–34.0)
MCHC: 27.8 g/dL — ABNORMAL LOW (ref 30.0–36.0)
MCV: 92.2 fL (ref 78.0–100.0)
Platelets: 389 10*3/uL (ref 150–400)
RBC: 3.83 MIL/uL — ABNORMAL LOW (ref 3.87–5.11)
RDW: 16.3 % — AB (ref 11.5–15.5)
WBC: 17.9 10*3/uL — AB (ref 4.0–10.5)

## 2016-01-11 LAB — VANCOMYCIN, TROUGH: Vancomycin Tr: 31 ug/mL (ref 15–20)

## 2016-01-11 MED ORDER — INFLUENZA VAC SPLIT QUAD 0.5 ML IM SUSY
0.5000 mL | PREFILLED_SYRINGE | INTRAMUSCULAR | Status: AC
  Administered 2016-01-12: 0.5 mL via INTRAMUSCULAR
  Filled 2016-01-11: qty 0.5

## 2016-01-11 MED ORDER — PNEUMOCOCCAL VAC POLYVALENT 25 MCG/0.5ML IJ INJ
0.5000 mL | INJECTION | INTRAMUSCULAR | Status: AC
  Administered 2016-01-12: 0.5 mL via INTRAMUSCULAR
  Filled 2016-01-11: qty 0.5

## 2016-01-11 NOTE — Progress Notes (Signed)
Rehab admissions - I met with patient.  She is fearful and does not feel ready for inpatient rehab today.  She would like to wait until Monday and hopes that she feels better after the weekend.  She is really hopeful that she can discharge directly home without inpatient rehab.  She does not really want to participate in 3 hours of therapy a day.  I talked to her at length about rehab and told her I would check back on Monday for her progress and potential inpatient rehab admission.  Call me for questions.  #[phone]

## 2016-01-11 NOTE — Progress Notes (Signed)
Speech Language Pathology Treatment: Dysphagia  Patient Details Name: [name] MRN: [id number] DOB: 1945/06/28 Today's Date: 01/11/2016 Time: 0277-4128 SLP Time Calculation (min) (ACUTE ONLY): 17 min  Assessment / Plan / Recommendation Clinical Impression  Pt had baseline coughing upon SLP arrival which was productive of small amounts of thicker secretions. Her voice sounds clear today, and she reports less hoarseness than previous date. Pt consumed ~8 oz of thin liquids with one episode of delayed coughing after intake, which was again productive of small amount of secretions. This did not appear to be tinged with the juice she had been drinking. Her vocal quality sounded mildly rough after this episode of coughing, but during intake was unchanged. Would continue with current diet and precautions (NO straws, meds whole in puree). SLP will continue to follow closely for tolerance given recent intubation and current respiratory status.   HPI HPI: 70 yo female with severe COPD on home O2 at 3-4l/m admitted with COPD exacerbation +HCAP w/ hypercarbic/hypoxic RF requiring intubation 9/17-9/20. Course complicated by AF RVR (of which she has history) and acute renal failure (hx CKD III).       SLP Plan  Continue with current plan of care     Recommendations  Diet recommendations: Dysphagia 3 (mechanical soft);Thin liquid Liquids provided via: Cup;No straw Medication Administration: Whole meds with puree Supervision: Patient able to self feed;Full supervision/cueing for compensatory strategies Compensations: Slow rate;Small sips/bites Postural Changes and/or Swallow Maneuvers: Seated upright 90 degrees;Upright 30-60 min after meal                Oral Care Recommendations: Oral care BID Follow up Recommendations:  (tba) Plan: Continue with current plan of care       GO                Maxcine Ham 01/11/2016, 10:23 AM  Maxcine Ham, M.A. CCC-SLP [phone]

## 2016-01-11 NOTE — Consult Note (Signed)
Physical Medicine and Rehabilitation Consult  Reason for Consult: Debility Referring Physician: Dr. Sharion DoveZapata   HPI: [name] is a 70 y.o. female with history of A fib, brain aneurysm s/p coiling, CKD, severe COPD with chronic respiratory failure--on 3-4L oxygen at home who was admitted 01/06/16 with hypoxic hypercarbic respiratory failure due to AECOPD. She was intubated in ED and started on IV solumedrol as well as IV antibiotics due to concerns of HCAP (recent SNF stay for COPD exacerbation).   She was noted to have rapid A fib requiring IV cardizmen.  Sputum cultures positive for MRSA and H flu and antibiotics adjusted to Vanc/Ceftriaxone with recommendations for 7 day treatment. She tolerated extubation on 01/09/16 and therapy evaluations done yesterday showing debility. CIR recommended for follow up therapy   Review of Systems  HENT: Negative for hearing loss.   Eyes: Negative for blurred vision and double vision.  Respiratory: Positive for cough, sputum production and shortness of breath.   Cardiovascular: Negative for chest pain and palpitations.  Gastrointestinal: Negative for abdominal pain and heartburn.  Musculoskeletal: Positive for falls and myalgias.  Skin: Negative for itching and rash.  Neurological: Positive for sensory change (BLE give out leading to falls) and weakness. Negative for dizziness and headaches.  Psychiatric/Behavioral: Positive for memory loss.      Past Medical History:  Diagnosis Date  . Acute respiratory failure (HCC) 02/05/2017  . Adrenal disorder, other    "adrenal Incidentaloma"  . Anemia   . Aneurysm (HCC)    x2  . Arthritis   . Asthma   . Atrial fibrillation (HCC)   . Brain aneurysm    have a "giant aneurysm, Dr. Bedelia Personevenshwar has stented and coiled in past x 4  . Bronchitis    history of  . Cerebral aneurysm   . Complication of anesthesia    Has woken up during surgery before  . COPD (chronic obstructive pulmonary disease) (HCC)     Dr. Nehemiah SettlePolite, uses 2.5L of o2 as needed  . Depression    due to loss of spouse 6 years ago  . Diabetes mellitus    metformin  . Emphysema of lung (HCC)    requested anethesia consult   . GERD (gastroesophageal reflux disease)    omeprazole  . Hypertension   . Obesity   . Rheumatoid arthritis(714.0)    has had for approx. 40years, sees Dr. Nehemiah SettlePolite  . Shortness of breath   . Sleep apnea    does not use cpap  . Urinary tract infection    history of    Past Surgical History:  Procedure Laterality Date  . BRAIN SURGERY     coiling and stenting  . BREAST SURGERY  02/28/11   right partial mastectomy   . CARDIOVERSION N/A 09/29/2015   Procedure: CARDIOVERSION;  Surgeon: Yates DecampJay Ganji, MD;  [facility];  Service: Cardiovascular;  Laterality: N/A;  . HAND SURGERY     bilateral  . HAND SURGERY     x 2  . MASTECTOMY, PARTIAL  02/28/2011   Procedure: MASTECTOMY PARTIAL;  Surgeon: Ernestene MentionHaywood M Ingram, MD;  [facility];  Service: General;  Laterality: Right;  RIGHT PARTIAL MASTECTOMY  WITH NEEDLE LOCALIZATION  . TUBAL LIGATION      Family History  Problem Relation Age of Onset  . Heart disease Mother   . Cancer Father     lung  . Cancer Sister     breast    Social History:  Lives alone--sister has been assisting for past few weeks?    Per reports she has quit smoking. Her smoking use included Cigarettes. She has a 60.00 pack-year smoking history. She has never used smokeless tobacco. Per reports she drinks about 0.6 oz of alcohol per week . She reports that she does not use drugs.   Allergies  Allergen Reactions  . Gold-Containing Drug Products Other (See Comments)    Blisters   . Tape Dermatitis    Medications Prior to Admission  Medication Sig Dispense Refill  . acetaminophen (TYLENOL) 325 MG tablet Take 1 tablet (325 mg total) by mouth every 4 (four) hours as needed for mild pain, moderate pain, fever or headache.    Marland Kitchen atenolol (TENORMIN) 25 MG tablet Take 25 mg by  mouth daily.    . budesonide-formoterol (SYMBICORT) 80-4.5 MCG/ACT inhaler Inhale 2 puffs into the lungs 2 (two) times daily. Reported on 09/25/2015    . Calcium 600-200 MG-UNIT tablet Take 1 tablet by mouth daily.    Marland Kitchen diltiazem (CARDIZEM CD) 120 MG 24 hr capsule Take 1 capsule (120 mg total) by mouth 2 (two) times daily.    . ferrous sulfate 325 (65 FE) MG tablet Take 325 mg by mouth daily with breakfast.    . furosemide (LASIX) 20 MG tablet Take 20 mg by mouth daily.    Marland Kitchen HYDROcodone-acetaminophen (NORCO/VICODIN) 5-325 MG tablet Take 1 tablet by mouth every 6 (six) hours as needed for moderate pain.    Marland Kitchen ibandronate (BONIVA) 150 MG tablet Take 150 mg by mouth every 30 (thirty) days. Take in the morning with a full glass of water, on an empty stomach, and do not take anything else by mouth or lie down for the next 30 min.    . insulin aspart (NOVOLOG) 100 UNIT/ML injection Inject 0-15 Units into the skin every 4 (four) hours. 10 mL 11  . insulin glargine (LANTUS) 100 UNIT/ML injection Inject 0.05 mLs (5 Units total) into the skin daily. 10 mL 11  . ipratropium-albuterol (DUONEB) 0.5-2.5 (3) MG/3ML SOLN Take 3 mLs by nebulization 3 (three) times daily. 360 mL   . LORazepam (ATIVAN) 0.5 MG tablet Take 1 tablet (0.5 mg total) by mouth every 8 (eight) hours as needed for anxiety. 15 tablet 0  . Multiple Vitamins-Minerals (CENTRUM SILVER ADULT 50+ PO) Take 1 tablet by mouth daily.    Marland Kitchen oxybutynin (DITROPAN) 5 MG tablet Take 0.5 tablets (2.5 mg total) by mouth 2 (two) times daily. 20 tablet 0  . OXYGEN Inhale 2.5 L into the lungs continuous.    . pantoprazole (PROTONIX) 40 MG tablet Take 1 tablet (40 mg total) by mouth daily.    . polyethylene glycol (MIRALAX / GLYCOLAX) packet Take 17 g by mouth daily. 14 each 0  . predniSONE (DELTASONE) 10 MG tablet Please take   20 mg daily for 2 days, then 10 mg daily for 2 days, then stop.    . rivaroxaban (XARELTO) 20 MG TABS tablet Take 1 tablet (20 mg total) by  mouth daily with supper. Start tomorrow on 6/14, already received dose during hospital stay today 30 tablet   . senna-docusate (SENOKOT-S) 8.6-50 MG tablet Take 2 tablets by mouth at bedtime. 60 tablet 0  . tiotropium (SPIRIVA) 18 MCG inhalation capsule Place 18 mcg into inhaler and inhale daily.     . traZODone (DESYREL) 50 MG tablet Take 50 mg by mouth at bedtime.    Marland Kitchen albuterol (PROAIR HFA) 108 (90 BASE) MCG/ACT  inhaler Inhale 2 puffs into the lungs every 6 (six) hours as needed. For shortness of breath      Home: Home Living Family/patient expects to be discharged to:: Private residence Living Arrangements: Other relatives (sister) Available Help at Discharge: Family, Available 24 hours/day Type of Home: House Home Access: Stairs to enter [employer] of Steps: 2 Entrance Stairs-Rails: Right, Left Home Layout: Two level, Able to live on main level with bedroom/bathroom, 1/2 bath on main level Alternate Level Stairs-Number of Steps: flight Alternate Level Stairs-Rails: Right, Left Bathroom Toilet: Standard Home Equipment: Walker - 4 wheels, Bedside commode, Cane - single point Additional Comments: sister is currently staying with her  Functional History: Prior Function Level of Independence: Independent with assistive device(s) Comments: sleeps on couch, sponge bathes and washes hair over sink Functional Status:  Mobility: Bed Mobility Overal bed mobility: Needs Assistance Bed Mobility: Supine to Sit Supine to sit: Min assist, HOB elevated General bed mobility comments: pt in bed Transfers Overall transfer level: Needs assistance Equipment used: Rolling walker (2 wheeled) Transfers: Sit to/from Stand, [employer] Transfers Sit to Stand: Min assist Stand pivot transfers: Min assist General transfer comment: min assist from [facility], cues for hand placement      ADL: ADL Overall ADL's : Needs assistance/impaired Eating/Feeding: Independent, Sitting Grooming:  Wash/dry hands, Sitting, Set up Upper Body Bathing: Minimal assitance, Sitting Lower Body Bathing: Moderate assistance, Sit to/from stand Upper Body Dressing : Minimal assistance, Sitting Lower Body Dressing: Moderate assistance, Sit to/from stand Toilet Transfer: Minimal assistance, Stand-pivot, RW, BSC Toileting- Clothing Manipulation and Hygiene: Total assistance, Sit to/from stand General ADL Comments: Pt with anxiety with mobility, becomes SOB and fatigues quickly.  Cognition: Cognition Overall Cognitive Status: Within Functional Limits for tasks assessed Orientation Level: Oriented X4 Cognition Arousal/Alertness: Awake/alert Behavior During Therapy: Anxious Overall Cognitive Status: Within Functional Limits for tasks assessed   Blood pressure (!) 134/49, pulse (!) 55, temperature 98 F (36.7 C), temperature source Oral, resp. rate 18, height 5\' 10"  (1.778 m), weight 109.4 kg (241 lb 2.9 oz), SpO2 99 %. Physical Exam  Nursing note and vitals reviewed. Constitutional: She is oriented to person, place, and time. She appears well-developed and well-nourished.  HENT:  Head: Normocephalic and atraumatic.  Mouth/Throat: Oropharynx is clear and moist.  Eyes: Conjunctivae are normal. Pupils are equal, round, and reactive to light.  Neck: Normal range of motion. Neck supple.  Cardiovascular: Normal rate and regular rhythm.  Exam reveals no friction rub.   No murmur heard. Respiratory: No stridor. She is in respiratory distress. She has wheezes.  Congested cough and increase WOB with conversation.   GI: Soft. Bowel sounds are normal. She exhibits no distension. There is no tenderness.  Musculoskeletal: She exhibits no edema or tenderness.  Neurological: She is alert and oriented to person, place, and time. She displays abnormal reflex. No cranial nerve deficit. Coordination normal.  Generally appropriate. Intact insight and awareness. Able to follow basic commands without difficulty.  UE 3/5 deltoid, 3+ bicep, tricep 4/5 wrist/HI. LE: 2+ HF, 3KE and 4/5 ADF/PF. No gross sensory findings.   Skin: Skin is warm and dry.  Psychiatric: Her affect is blunt. She is agitated. Cognition and memory are impaired.    Results for orders placed or performed during the hospital encounter of 01/06/16 (from the past 24 hour(s))  Glucose, capillary     Status: Abnormal   Collection Time: 01/10/16 11:41 AM  Result Value Ref Range   Glucose-Capillary 194 (H) 65 -  99 mg/dL   Comment 1 Document in Chart   Glucose, capillary     Status: Abnormal   Collection Time: 01/10/16  3:47 PM  Result Value Ref Range   Glucose-Capillary 207 (H) 65 - 99 mg/dL   Comment 1 Notify RN   Glucose, capillary     Status: Abnormal   Collection Time: 01/10/16  8:10 PM  Result Value Ref Range   Glucose-Capillary 172 (H) 65 - 99 mg/dL   Comment 1 Notify RN    Comment 2 Document in Chart   Glucose, capillary     Status: Abnormal   Collection Time: 01/10/16 11:37 PM  Result Value Ref Range   Glucose-Capillary 155 (H) 65 - 99 mg/dL   Comment 1 Notify RN    Comment 2 Document in Chart   CBC     Status: Abnormal   Collection Time: 01/11/16  3:34 AM  Result Value Ref Range   WBC 17.9 (H) 4.0 - 10.5 K/uL   RBC 3.83 (L) 3.87 - 5.11 MIL/uL   Hemoglobin 9.8 (L) 12.0 - 15.0 g/dL   HCT 57.8 (L) 46.9 - 62.9 %   MCV 92.2 78.0 - 100.0 fL   MCH 25.6 (L) 26.0 - 34.0 pg   MCHC 27.8 (L) 30.0 - 36.0 g/dL   RDW 52.8 (H) 41.3 - 24.4 %   Platelets 389 150 - 400 K/uL  Basic metabolic panel     Status: Abnormal   Collection Time: 01/11/16  3:34 AM  Result Value Ref Range   Sodium 140 135 - 145 mmol/L   Potassium 3.6 3.5 - 5.1 mmol/L   Chloride 94 (L) 101 - 111 mmol/L   CO2 40 (H) 22 - 32 mmol/L   Glucose, Bld 90 65 - 99 mg/dL   BUN 34 (H) 6 - 20 mg/dL   Creatinine, Ser 0.10 (H) 0.44 - 1.00 mg/dL   Calcium 8.9 8.9 - 27.2 mg/dL   GFR calc non Af Amer 48 (L) >60 mL/min   GFR calc Af Amer 56 (L) >60 mL/min   Anion gap 6  5 - 15  Glucose, capillary     Status: None   Collection Time: 01/11/16  5:39 AM  Result Value Ref Range   Glucose-Capillary 85 65 - 99 mg/dL   No results found.  Assessment/Plan: Diagnosis: Severe deconditioning related to acute on chronic respiratory failure 1. Does the need for close, 24 hr/day medical supervision in concert with the patient's rehab needs make it unreasonable for this patient to be served in a less intensive setting? Yes 2. Co-Morbidities requiring supervision/potential complications: COPD, afib, htn, anemia 3. Due to bladder management, bowel management, safety, skin/wound care, disease management, medication administration, pain management and patient education, does the patient require 24 hr/day rehab nursing? Yes 4. Does the patient require coordinated care of a physician, rehab nurse, PT (1-2 hrs/day, 5 days/week) and OT (1-2 hrs/day, 5 days/week) to address physical and functional deficits in the context of the above medical diagnosis(es)? Yes Addressing deficits in the following areas: balance, endurance, locomotion, strength, transferring, bowel/bladder control, bathing, dressing, feeding, grooming, toileting and psychosocial support 5. Can the patient actively participate in an intensive therapy program of at least 3 hrs of therapy per day at least 5 days per week? Yes 6. The potential for patient to make measurable gains while on inpatient rehab is excellent 7. Anticipated functional outcomes upon discharge from inpatient rehab are modified independent and supervision  with PT, modified independent and supervision  with OT, n/a with SLP. 8. Estimated rehab length of stay to reach the above functional goals is: 7-11 days 9. Does the patient have adequate social supports and living environment to accommodate these discharge functional goals? Yes 10. Anticipated D/C setting: Home 11. Anticipated post D/C treatments: HH therapy and Outpatient therapy 12. Overall  Rehab/Functional Prognosis: excellent  RECOMMENDATIONS: This patient's condition is appropriate for continued rehabilitative care in the following setting: CIR Patient has agreed to participate in recommended program. Yes Note that insurance prior authorization may be required for reimbursement for recommended care.  Comment: Pt was a limited ambulator PTA due to oxygen dependent COPD. Rehab Admissions Coordinator to follow up.  Thanks,  Ranelle Oyster, MD, [place]     01/11/2016

## 2016-01-11 NOTE — Progress Notes (Signed)
Pharmacy Antibiotic Note  [name] is a 70 y.o. female admitted on 01/06/2016.  Pharmacy has been consulted for vancomycin dosing for HCAP.  Vanc/Rocephin day #6/7. 9/17 Trach aspirate with H flu and MRSA.  WBC 17.9 on steroids.  Afebrile. Creat 1.13.  Vancomycin level 31 mcg/ml drawn at 0951 and dose was hung at 0830 per RN - so NOT a trough level.  Vancomycin level was drawn during infusion.   Plan: Continue vanc 1250 q24 until 9/23 PM Continue Rocephin 2 gm q24 until 9/23 PM  For 7 days of both  Height: 5\' 10"  (177.8 cm) Weight: 241 lb 2.9 oz (109.4 kg) IBW/kg (Calculated) : 68.5  Temp (24hrs), Avg:98.2 F (36.8 C), Min:97.6 F (36.4 C), Max:98.9 F (37.2 C)   Recent Labs Lab 01/06/16 0948 01/07/16 0337 01/08/16 0456 01/08/16 1445 01/09/16 0753 01/09/16 1647 01/10/16 0211 01/11/16 0334 01/11/16 0951  WBC  --  22.1* 18.8*  --  12.5*  --  14.7* 17.9*  --   CREATININE  --  1.71* 1.45* 1.34* 1.36* 1.22* 1.16* 1.13*  --   LATICACIDVEN 1.8  --   --   --   --   --   --   --   --   VANCOTROUGH  --   --   --   --   --   --   --   --  31*    Estimated Creatinine Clearance: 62.1 mL/min (by C-G formula based on SCr of 1.13 mg/dL (H)).    Allergies  Allergen Reactions  . Gold-Containing Drug Products Other (See Comments)    Blisters   . Tape Dermatitis    Thank you for allowing pharmacy to be a part of this patient's care.  01/13/16, Pharm.D. Herby Abraham 01/11/2016 2:42 PM

## 2016-01-11 NOTE — Clinical Social Work Note (Signed)
CSW met with patient. No supports at bedside. Discussed SNF as CIR backup. Patient does not want to go to SNF. Discussed bad experience that she had at SNF earlier this summer. CSW provided emotional support. Patient wants either CIR or HHPT. She is already established with [facility]. RNCM notified.  CSW signing off. Consult again if any social work needs arise.  Dayton Scrape, [place]

## 2016-01-11 NOTE — Progress Notes (Signed)
PROGRESS NOTE [facility]   [name]   TOI:[id number] DOB: [birth year]  DOA: 01/06/2016 PCP: [name], MD   Brief Narrative:   70 yo female former smoker with known COPD and chronic resp failure on home O2 at 3-4 l./m with multiple comorbidities with DM, chronic anemia , chronic renal disease-stage III , cerebral aneurysm s/p coiling in 2009 , Afib with RVR 09/2015 s/p cardioversion and begin anticoagulation (xarelto) found lethargic at home by family. EMS brought to ER at [facility] found to be hypoxic and hypercarbic . Placed on BIPAP without sigificant improvement requiring intubation and vent support. ABG showed severe hypercarbia with PCO2 >100. Most recent admission was in June 2017 with similar presentation. Discharged to SNF and then to home.   Subjective:  Patient seen and examined today. Patient report improvement in her breathing almost back to baseline. On nasal cannula 4 L. Patient also reported that she's feeling weak and unable to do her daily activities. Physical therapy was consulted which recommended inpatient rehabilitation. No acute events overnight.  Assessment & Plan:   70 yo female with severe COPD on home O2 at 3-4l/m admitted with COPD exacerbation +HCAP w/ hypercarbic/hypoxic RF requiring intubation. Course complicated by AF RVR (of which she has history) and acute renal failure (hx CKD III).       Acute on Chronic  Hypercarbic/Hypoxic Resp Failure with severe CAP and acute pulmonary edema > resolved. COPD exacerbation (home symbicort/spiriva) Home O2 at 4l/m > resolved ?OSA/OHS  not on CPAP at home  Continue Spiriva and Dulera in [place] (OK to use symbicort at home again) Albuterol prn Prednisone taper Titrate O2 for O2 > 90%  HTN  Echo 01/2015 EF 55-60%, mild dilated LA  Atrial Fib (dx 09/2015 ) cardioversion 09/2015 , cerebral angiogram d/t prev aneurysm coliing 2009, no contraindication for anticoagualtion d/ch on xarelto Telemetry monitoring    Hep drip > convert to Xarelto today  Change dilt to oral Continue metoprolol oral Diuresis PRN    Acute on chronic Kidney disease (scr 09/2015 1.0 )  Hypokalemia  Monitor BMET and UOP Replace electrolytes as needed  GERD PPI  SLP evaluation    Anemia  Monitor H/H on hep   HCAP > MRSA and haemophilus in sputum Will complete a total of 7 day of abx   DM  Regular insulin SSI here Consider restarting glargine  Deconditioning  For inpatient rehab  DVT prophylaxis: Xarelto Code Status: DNR Family Communication: None at bedside, plan discussed with patient in details. Disposition Plan: For inpatient rehab once medically stable  Consultants:   PCCM  Procedures:  None  Antimicrobials:  Ceftriaxone 9/18  Vanco 9/19   Objective: Vitals:   01/11/16 0548 01/11/16 0913 01/11/16 0915 01/11/16 0916  BP: (!) 134/49     Pulse: (!) 55     Resp: 18     Temp: 98 F (36.7 C)     TempSrc: Oral     SpO2: 99% 92% 92% 92%  Weight: 109.4 kg (241 lb 2.9 oz)     Height:        Intake/Output Summary (Last 24 hours) at 01/11/16 1633 Last data filed at 01/11/16 1002  Gross per 24 hour  Intake              400 ml  Output              275 ml  Net  125 ml   Filed Weights   01/10/16 0500 01/10/16 2150 01/11/16 0548  Weight: 109 kg (240 lb 4.8 oz) 110.2 kg (243 lb) 109.4 kg (241 lb 2.9 oz)    Examination:  General exam: Appears calm and comfortable, Obese Respiratory system: Clear to auscultation. No wheezes,crackle or rhonchi Cardiovascular system: S1 & S2 heard, RRR. No JVD, murmurs, rubs or gallops Gastrointestinal system: Abdomen obese is nondistended, soft and nontender. No organomegaly or masses. Central nervous system: Alert and oriented. No focal neurological deficits. Extremities: No pedal edema. Skin: No rashes, lesions or ulcers Psychiatry: Judgement and insight appear normal. Mood & affect appropriate.    Data Reviewed: I have personally  reviewed following labs and imaging studies  CBC:  Recent Labs Lab 01/06/16 0538 01/07/16 0337 01/08/16 0456 01/09/16 0753 01/10/16 0211 01/11/16 0334  WBC 32.4* 22.1* 18.8* 12.5* 14.7* 17.9*  NEUTROABS 28.8*  --   --   --  11.9*  --   HGB 10.8* 9.5* 9.3* 9.0* 9.2* 9.8*  HCT 37.6 32.2* 30.8* 30.1* 32.5* 35.3*  MCV 92.8 88.0 85.8 87.2 90.5 92.2  PLT 389 344 338 331 348 389   Basic Metabolic Panel:  Recent Labs Lab 01/06/16 0948  01/07/16 1215 01/07/16 1626 01/08/16 0456 01/08/16 1445 01/09/16 0753 01/09/16 1647 01/10/16 0211 01/11/16 0334  NA  --   < >  --   --  143 137 139 142 143 140  K  --   < >  --   --  3.0* 4.3 3.5 4.0 3.6 3.6  CL  --   < >  --   --  93* 92* 92* 93* 96* 94*  CO2  --   < >  --   --  34* 32 37* 39* 40* 40*  GLUCOSE  --   < >  --   --  135* 213* 277* 204* 120* 90  BUN  --   < >  --   --  39* 40* 50* 47* 43* 34*  CREATININE  --   < >  --   --  1.45* 1.34* 1.36* 1.22* 1.16* 1.13*  CALCIUM  --   < >  --   --  8.9 8.4* 8.5* 8.8* 8.5* 8.9  MG 1.2*  --  1.6* 1.5* 2.3 2.0  --   --   --   --   PHOS 4.1  --  2.2* 2.3* 3.4 4.1  --   --   --   --   < > = values in this interval not displayed. GFR: Estimated Creatinine Clearance: 62.1 mL/min (by C-G formula based on SCr of 1.13 mg/dL (H)). Liver Function Tests:  Recent Labs Lab 01/06/16 0538  AST 15  ALT 13*  ALKPHOS 62  BILITOT 0.4  PROT 7.0  ALBUMIN 2.9*   No results for input(s): LIPASE, AMYLASE in the last 168 hours. No results for input(s): AMMONIA in the last 168 hours. Coagulation Profile: No results for input(s): INR, PROTIME in the last 168 hours. Cardiac Enzymes:  Recent Labs Lab 01/06/16 0538 01/06/16 0948 01/06/16 1353 01/06/16 2146  TROPONINI 0.08* 0.13* 0.03* <0.03   BNP (last 3 results) No results for input(s): PROBNP in the last 8760 hours. HbA1C: No results for input(s): HGBA1C in the last 72 hours. CBG:  Recent Labs Lab 01/10/16 2010 01/10/16 2337 01/11/16 0539  01/11/16 0845 01/11/16 1217  GLUCAP 172* 155* 85 63* 136*   Lipid Profile: No results for input(s): CHOL, HDL, LDLCALC,  TRIG, CHOLHDL, LDLDIRECT in the last 72 hours. Thyroid Function Tests: No results for input(s): TSH, T4TOTAL, FREET4, T3FREE, THYROIDAB in the last 72 hours. Anemia Panel: No results for input(s): VITAMINB12, FOLATE, FERRITIN, TIBC, IRON, RETICCTPCT in the last 72 hours. Sepsis Labs:  Recent Labs Lab 01/06/16 0948 01/07/16 1215 01/08/16 0456  PROCALCITON 1.43 0.95 0.82  LATICACIDVEN 1.8  --   --     Recent Results (from the past 240 hour(s))  Culture, blood (routine x 2)     Status: None   Collection Time: 01/06/16  9:43 AM  Result Value Ref Range Status   Specimen Description BLOOD RIGHT HAND  Final   Special Requests BOTTLES DRAWN AEROBIC ONLY  4CC  Final   Culture NO GROWTH 5 DAYS  Final   Report Status 01/11/2016 FINAL  Final  Culture, blood (routine x 2)     Status: None   Collection Time: 01/06/16  9:51 AM  Result Value Ref Range Status   Specimen Description BLOOD RIGHT HAND  Final   Special Requests BOTTLES DRAWN AEROBIC ONLY  5CC  Final   Culture NO GROWTH 5 DAYS  Final   Report Status 01/11/2016 FINAL  Final  Culture, respiratory (tracheal aspirate)     Status: None   Collection Time: 01/06/16 10:10 AM  Result Value Ref Range Status   Specimen Description TRACHEAL ASPIRATE  Final   Special Requests NONE  Final   Gram Stain   Final    ABUNDANT WBC PRESENT, PREDOMINANTLY PMN ABUNDANT GRAM NEGATIVE COCCOBACILLI    Culture   Final    MODERATE HAEMOPHILUS INFLUENZAE BETA LACTAMASE POSITIVE FEW METHICILLIN RESISTANT STAPHYLOCOCCUS AUREUS    Report Status 01/09/2016 FINAL  Final   Organism ID, Bacteria METHICILLIN RESISTANT STAPHYLOCOCCUS AUREUS  Final      Susceptibility   Methicillin resistant staphylococcus aureus - MIC*    CIPROFLOXACIN >=8 RESISTANT Resistant     ERYTHROMYCIN >=8 RESISTANT Resistant     GENTAMICIN <=0.5 SENSITIVE  Sensitive     OXACILLIN >=4 RESISTANT Resistant     TETRACYCLINE <=1 SENSITIVE Sensitive     VANCOMYCIN <=0.5 SENSITIVE Sensitive     TRIMETH/SULFA <=10 SENSITIVE Sensitive     CLINDAMYCIN <=0.25 SENSITIVE Sensitive     RIFAMPIN <=0.5 SENSITIVE Sensitive     Inducible Clindamycin NEGATIVE Sensitive     * FEW METHICILLIN RESISTANT STAPHYLOCOCCUS AUREUS  MRSA PCR Screening     Status: Abnormal   Collection Time: 01/06/16 10:48 AM  Result Value Ref Range Status   MRSA by PCR POSITIVE (A) NEGATIVE Final    Comment:        The GeneXpert MRSA Assay (FDA approved for NASAL specimens only), is one component of a comprehensive MRSA colonization surveillance program. It is not intended to diagnose MRSA infection nor to guide or monitor treatment for MRSA infections. RESULT CALLED TO, READ BACK BY AND VERIFIED WITH: C. MASLINZ 1252 [id number] N. MORRIS      Radiology Studies: No results found.   Scheduled Meds: . cefTRIAXone (ROCEPHIN)  IV  2 g Intravenous Q24H  . Chlorhexidine Gluconate Cloth  6 each Topical Q0600  . diltiazem  120 mg Oral Q12H  . [START ON 01/12/2016] Influenza vac split quadrivalent PF  0.5 mL Intramuscular Tomorrow-1000  . insulin aspart  0-15 Units Subcutaneous Q4H  . metoprolol tartrate  25 mg Oral BID  . mometasone-formoterol  2 puff Inhalation BID  . mupirocin ointment  1 application Nasal BID  . [START ON  01/12/2016] pneumococcal 23 valent vaccine  0.5 mL Intramuscular Tomorrow-1000  . predniSONE  40 mg Oral Q breakfast   Followed by  . [START ON 01/13/2016] predniSONE  30 mg Oral Q breakfast   Followed by  . [START ON 01/16/2016] predniSONE  20 mg Oral Q breakfast   Followed by  . [START ON 01/18/2016] predniSONE  10 mg Oral Q breakfast  . rivaroxaban  20 mg Oral Q supper  . tiotropium  18 mcg Inhalation Daily  . vancomycin  1,250 mg Intravenous Q24H   Continuous Infusions:    LOS: 5 days   Latrelle Dodrill, MD [facility] Pager [phone]  If  7PM-7AM, please contact night-coverage [email or web address] Password [facility] 01/11/2016, 4:33 PM

## 2016-01-11 NOTE — Progress Notes (Signed)
Nutrition Follow-up  DOCUMENTATION CODES:   Obesity unspecified  INTERVENTION:  Encouraged adequate healthful PO intake with protein rich foods No further nutrition interventions warranted at this time  NUTRITION DIAGNOSIS:   Inadequate oral intake related to inability to eat as evidenced by NPO status.  discontinued  GOAL:   Patient will meet greater than or equal to 90% of their needs  progressing  MONITOR:   PO intake, Labs, I & O's, Skin  REASON FOR ASSESSMENT:   Consult Enteral/tube feeding initiation and management  ASSESSMENT:   70 yo female with severe COPD on home O2 at 3-4l/m admitted with COPD exacerbation +HCAP w/ hypercarbic/hypoxic RF on vent .   Pt states that her appetite is good and she is eating well. Per nursing notes pt only ate a few bites of breakfast and it appears that patient ate about 50% of lunch. Pt denies and nutrition needs. She denies any swallowing difficulty and is upset about being on Dysphagia 3 diet. RD encouraged adequate healthful PO intake with protein at every meal.  Labs: glucose ranging 63 to 172 mg/dL, low chloride,elevated BUN, low hemoglobin  Diet Order:  DIET DYS 3 Room service appropriate? Yes; Fluid consistency: Thin  Skin:  Reviewed, no issues  Last BM:  PTA  Height:   Ht Readings from Last 1 Encounters:  01/06/16 5\' 10"  (1.778 m)    Weight:   Wt Readings from Last 1 Encounters:  01/11/16 241 lb 2.9 oz (109.4 kg)    Ideal Body Weight:  68.2 kg  BMI:  Body mass index is 34.61 kg/m.  Estimated Nutritional Needs:   Kcal:  1650-1950  Protein:  100-110 grams  Fluid:  2.8 L/day  EDUCATION NEEDS:   No education needs identified at this time  01/13/16 RD, CSP, LDN Inpatient Clinical Dietitian Pager: [phone] After Hours Pager: [phone]

## 2016-01-11 NOTE — Consult Note (Addendum)
   [facility] CM Inpatient Consult   01/11/2016  [name] 1946/01/02 [id number]   Follow up:  Patient evaluated for community based chronic disease management services with [facility] Care Management Program as a benefit of patient's Medicare Insurance. Spoke with patient at bedside to explain [facility] Care Management services.  Patient endorses Dr. Renford Dills to be her primary care provider.  Patient expresses her difficulty getting to her MD appointments due to higher 02 needs and lack of transportation. States, "I am just too weak and short of breath to drive.  I have been trying to get the portable smaller tanks to get to appointments and unable to manage the larger tanks due to being short of breath and weak.  I use [facility] for oxygen and home health. I didn't like that rehab place I was at.  Fortunately, my sister has come to stay with me for a little while."  Consent form obtained.     Patient will receive post hospital discharge call and will be evaluated for monthly home visits for assessments and disease process education.  Left contact information and [facility] literature at bedside. Made Inpatient Case Manager aware that [facility] Care Management following. Of note, [facility] Care Management services does not replace or interfere with any services that are arranged by inpatient case management or social work.  For additional questions or referrals please contact:    Charlesetta Shanks, RN BSN CCM [facility]  [phone] business mobile phone Toll free office [phone]  Chart reviewed for disposition, will assign to community as appropriate patient being access for rehab needs.

## 2016-01-11 NOTE — Progress Notes (Signed)
Could u change pt to ACHS instead of q4hr CBG's pt is eating now, thanks Stefanie Libel RN

## 2016-01-11 NOTE — Progress Notes (Signed)
Pt takes Trazadone 50 mg to help with sleep at home, thanks Glenna Fellows.

## 2016-01-12 DIAGNOSIS — M6281 Muscle weakness (generalized): Secondary | ICD-10-CM

## 2016-01-12 DIAGNOSIS — R197 Diarrhea, unspecified: Secondary | ICD-10-CM

## 2016-01-12 LAB — CBC
HCT: 34.6 % — ABNORMAL LOW (ref 36.0–46.0)
Hemoglobin: 9.5 g/dL — ABNORMAL LOW (ref 12.0–15.0)
MCH: 25.3 pg — AB (ref 26.0–34.0)
MCHC: 27.5 g/dL — AB (ref 30.0–36.0)
MCV: 92.3 fL (ref 78.0–100.0)
PLATELETS: 372 10*3/uL (ref 150–400)
RBC: 3.75 MIL/uL — ABNORMAL LOW (ref 3.87–5.11)
RDW: 16.1 % — AB (ref 11.5–15.5)
WBC: 15.6 10*3/uL — ABNORMAL HIGH (ref 4.0–10.5)

## 2016-01-12 LAB — BASIC METABOLIC PANEL
Anion gap: 9 (ref 5–15)
BUN: 29 mg/dL — AB (ref 6–20)
CO2: 39 mmol/L — ABNORMAL HIGH (ref 22–32)
CREATININE: 1.1 mg/dL — AB (ref 0.44–1.00)
Calcium: 9 mg/dL (ref 8.9–10.3)
Chloride: 95 mmol/L — ABNORMAL LOW (ref 101–111)
GFR calc Af Amer: 58 mL/min — ABNORMAL LOW (ref >60)
GFR, EST NON AFRICAN AMERICAN: 50 mL/min — AB (ref >60)
GLUCOSE: 100 mg/dL — AB (ref 65–99)
Potassium: 3.8 mmol/L (ref 3.5–5.1)
SODIUM: 143 mmol/L (ref 135–145)

## 2016-01-12 LAB — GLUCOSE, CAPILLARY
GLUCOSE-CAPILLARY: 77 mg/dL (ref 65–99)
GLUCOSE-CAPILLARY: 84 mg/dL (ref 65–99)
GLUCOSE-CAPILLARY: 175 mg/dL — AB (ref 65–99)
GLUCOSE-CAPILLARY: 237 mg/dL — AB (ref 65–99)
GLUCOSE-CAPILLARY: 159 mg/dL — AB (ref 65–99)
Glucose-Capillary: 183 mg/dL — ABNORMAL HIGH (ref 65–99)
Glucose-Capillary: 95 mg/dL (ref 65–99)

## 2016-01-12 NOTE — Progress Notes (Signed)
Patient refused BIPAP QHS while here in the hospital and at home. RT will continue to monitor as needed.

## 2016-01-12 NOTE — Progress Notes (Addendum)
PROGRESS NOTE [facility]   [name]   ZYS:[id number] DOB: [birth year]  DOA: 01/06/2016 PCP: [name], MD   Brief Narrative:   70 yo female former smoker with known COPD and chronic resp failure on home O2 at 3-4 l./m with multiple comorbidities with DM, chronic anemia , chronic renal disease-stage III , cerebral aneurysm s/p coiling in 2009 , Afib with RVR 09/2015 s/p cardioversion and begin anticoagulation (xarelto) found lethargic at home by family. EMS brought to ER at [facility] found to be hypoxic and hypercarbic . Placed on BIPAP without sigificant improvement requiring intubation and vent support. ABG showed severe hypercarbia with PCO2 >100. Most recent admission was in June 2017 with similar presentation. Patient transferred from the ICU to [facility] on 9/22. Breathing well, on O2 at baseline. Patient was complaining of weakness, and inpatient rehab did an evaluation, recommending admission. Patient refused to go, because she is not ready for physical therapy.   Subjective:  Patient seen and examined at bedside. Patient much better this morning, feels her strength is getting back. No c/o NB diarrhea since 2 days ago. Patient report improvement in her breathing almost back to baseline. On nasal cannula 4 L. No acute events overnight.   Assessment & Plan:   70 yo female with severe COPD on home O2 at 3-4l/m admitted with COPD exacerbation +HCAP w/ hypercarbic/hypoxic RF requiring intubation. Course complicated by AF RVR (of which she has history) and acute renal failure (hx CKD III). Now with generalize weakness due to deconditioning and also with diarrhea.   Diarrhea - for 2 days, multiples episodes. Elevated WBC but this could be related to prednisone use.  Sent for cdiff    Addendum 11:20 AM - nurse report the stools are not watery and does not smell like C Diff. Will DC c diff order and continue to monitor.    Acute on Chronic  Hypercarbic/Hypoxic Resp Failure with severe CAP  and acute pulmonary edema > resolved. COPD exacerbation (home symbicort/spiriva) Home O2 at 4l/m > resolved ?OSA/OHS  not on CPAP at home  Continue Spiriva and Dulera in hospital (OK to use symbicort at home again) Albuterol prn Prednisone taper Titrate O2 for O2 > 90%   HTN  Echo 01/2015 EF 55-60%, mild dilated LA  Atrial Fib (dx 09/2015 ) cardioversion 09/2015 , cerebral angiogram d/t prev aneurysm coliing 2009, no contraindication for anticoagualtion d/ch on xarelto Telemetry monitoring  Continue AC  Change dilt to oral Continue metoprolol oral Diuresis PRN   Acute on chronic Kidney disease (scr 09/2015 1.0 ) Creatinine back to baseline  Hypokalemia - resolved  Monitor BMP Replace electrolytes as needed  GERD PPI     Anemia of chronic disease - H/H stable  Monitor cbc - on AC  HCAP > MRSA and haemophilus in sputum Will complete a total of 7 day of abx   DM- stable  Regular insulin SSI here Consider restarting glargine  Deconditioning and dysphagia  Rehab medicine recommended inp rehab  SLP to re-eval patient want change in diet  If patient does not agree with inp rehab will d/c home on Monday with [facility]  DVT prophylaxis: Xarelto Code Status: DNR Family Communication: None at bedside, plan discussed with patient in details. Disposition Plan: d/c in 48 hrs to either inp rehab or home with [facility] and PT  Consultants:   PCCM  Procedures:  None  Antimicrobials:  Ceftriaxone 9/18  Vanco 9/19   Objective: Vitals:   01/11/16 0915 01/11/16 9323  01/11/16 1944 01/12/16 0435  BP:   (!) 161/56 (!) 131/40  Pulse:   77 (!) 58  Resp:   20 20  Temp:   98.4 F (36.9 C) 98.2 F (36.8 C)  TempSrc:   Oral Oral  SpO2: 92% 92% 92% 91%  Weight:    108.9 kg (240 lb 1.6 oz)  Height:        Intake/Output Summary (Last 24 hours) at 01/12/16 0831 Last data filed at 01/12/16 0530  Gross per 24 hour  Intake             1486 ml  Output              900 ml  Net               586 ml   Filed Weights   01/10/16 2150 01/11/16 0548 01/12/16 0435  Weight: 110.2 kg (243 lb) 109.4 kg (241 lb 2.9 oz) 108.9 kg (240 lb 1.6 oz)    Examination:  General exam: Appears calm and comfortable, Obese Respiratory system: Good air entry, slight wheezing at the left upper lobes. No crackles or rhonchi  Cardiovascular system: S1 & S2 heard, RRR. No JVD, murmurs, rubs or gallops Gastrointestinal system: Abdomen obese is nondistended, soft and nontender. No organomegaly or masses. Central nervous system: Alert and oriented. No focal neurological deficits. Extremities: No pedal edema. Skin: No rashes, lesions or ulcers Psychiatry: Judgement and insight appear normal. Mood & affect appropriate.    Data Reviewed: I have personally reviewed following labs and imaging studies  CBC:  Recent Labs Lab 01/06/16 0538  01/08/16 0456 01/09/16 0753 01/10/16 0211 01/11/16 0334 01/12/16 0126  WBC 32.4*  < > 18.8* 12.5* 14.7* 17.9* 15.6*  NEUTROABS 28.8*  --   --   --  11.9*  --   --   HGB 10.8*  < > 9.3* 9.0* 9.2* 9.8* 9.5*  HCT 37.6  < > 30.8* 30.1* 32.5* 35.3* 34.6*  MCV 92.8  < > 85.8 87.2 90.5 92.2 92.3  PLT 389  < > 338 331 348 389 372  < > = values in this interval not displayed. Basic Metabolic Panel:  Recent Labs Lab 01/06/16 0948  01/07/16 1215 01/07/16 1626 01/08/16 0456 01/08/16 1445 01/09/16 0753 01/09/16 1647 01/10/16 0211 01/11/16 0334 01/12/16 0126  NA  --   < >  --   --  143 137 139 142 143 140 143  K  --   < >  --   --  3.0* 4.3 3.5 4.0 3.6 3.6 3.8  CL  --   < >  --   --  93* 92* 92* 93* 96* 94* 95*  CO2  --   < >  --   --  34* 32 37* 39* 40* 40* 39*  GLUCOSE  --   < >  --   --  135* 213* 277* 204* 120* 90 100*  BUN  --   < >  --   --  39* 40* 50* 47* 43* 34* 29*  CREATININE  --   < >  --   --  1.45* 1.34* 1.36* 1.22* 1.16* 1.13* 1.10*  CALCIUM  --   < >  --   --  8.9 8.4* 8.5* 8.8* 8.5* 8.9 9.0  MG 1.2*  --  1.6* 1.5* 2.3 2.0  --   --   --   --    --   PHOS 4.1  --  2.2*  2.3* 3.4 4.1  --   --   --   --   --   < > = values in this interval not displayed. GFR: Estimated Creatinine Clearance: 63.6 mL/min (by C-G formula based on SCr of 1.1 mg/dL (H)). Liver Function Tests:  Recent Labs Lab 01/06/16 0538  AST 15  ALT 13*  ALKPHOS 62  BILITOT 0.4  PROT 7.0  ALBUMIN 2.9*   No results for input(s): LIPASE, AMYLASE in the last 168 hours. No results for input(s): AMMONIA in the last 168 hours. Coagulation Profile: No results for input(s): INR, PROTIME in the last 168 hours. Cardiac Enzymes:  Recent Labs Lab 01/06/16 0538 01/06/16 0948 01/06/16 1353 01/06/16 2146  TROPONINI 0.08* 0.13* 0.03* <0.03   BNP (last 3 results) No results for input(s): PROBNP in the last 8760 hours. HbA1C: No results for input(s): HGBA1C in the last 72 hours. CBG:  Recent Labs Lab 01/11/16 1641 01/11/16 2112 01/12/16 0106 01/12/16 0421 01/12/16 0759  GLUCAP 161* 160* 95 77 84   Lipid Profile: No results for input(s): CHOL, HDL, LDLCALC, TRIG, CHOLHDL, LDLDIRECT in the last 72 hours. Thyroid Function Tests: No results for input(s): TSH, T4TOTAL, FREET4, T3FREE, THYROIDAB in the last 72 hours. Anemia Panel: No results for input(s): VITAMINB12, FOLATE, FERRITIN, TIBC, IRON, RETICCTPCT in the last 72 hours. Sepsis Labs:  Recent Labs Lab 01/06/16 0948 01/07/16 1215 01/08/16 0456  PROCALCITON 1.43 0.95 0.82  LATICACIDVEN 1.8  --   --     Recent Results (from the past 240 hour(s))  Culture, blood (routine x 2)     Status: None   Collection Time: 01/06/16  9:43 AM  Result Value Ref Range Status   Specimen Description BLOOD RIGHT HAND  Final   Special Requests BOTTLES DRAWN AEROBIC ONLY  4CC  Final   Culture NO GROWTH 5 DAYS  Final   Report Status 01/11/2016 FINAL  Final  Culture, blood (routine x 2)     Status: None   Collection Time: 01/06/16  9:51 AM  Result Value Ref Range Status   Specimen Description BLOOD RIGHT HAND  Final    Special Requests BOTTLES DRAWN AEROBIC ONLY  5CC  Final   Culture NO GROWTH 5 DAYS  Final   Report Status 01/11/2016 FINAL  Final  Culture, respiratory (tracheal aspirate)     Status: None   Collection Time: 01/06/16 10:10 AM  Result Value Ref Range Status   Specimen Description TRACHEAL ASPIRATE  Final   Special Requests NONE  Final   Gram Stain   Final    ABUNDANT WBC PRESENT, PREDOMINANTLY PMN ABUNDANT GRAM NEGATIVE COCCOBACILLI    Culture   Final    MODERATE HAEMOPHILUS INFLUENZAE BETA LACTAMASE POSITIVE FEW METHICILLIN RESISTANT STAPHYLOCOCCUS AUREUS    Report Status 01/09/2016 FINAL  Final   Organism ID, Bacteria METHICILLIN RESISTANT STAPHYLOCOCCUS AUREUS  Final      Susceptibility   Methicillin resistant staphylococcus aureus - MIC*    CIPROFLOXACIN >=8 RESISTANT Resistant     ERYTHROMYCIN >=8 RESISTANT Resistant     GENTAMICIN <=0.5 SENSITIVE Sensitive     OXACILLIN >=4 RESISTANT Resistant     TETRACYCLINE <=1 SENSITIVE Sensitive     VANCOMYCIN <=0.5 SENSITIVE Sensitive     TRIMETH/SULFA <=10 SENSITIVE Sensitive     CLINDAMYCIN <=0.25 SENSITIVE Sensitive     RIFAMPIN <=0.5 SENSITIVE Sensitive     Inducible Clindamycin NEGATIVE Sensitive     * FEW METHICILLIN RESISTANT STAPHYLOCOCCUS AUREUS  MRSA PCR Screening  Status: Abnormal   Collection Time: 01/06/16 10:48 AM  Result Value Ref Range Status   MRSA by PCR POSITIVE (A) NEGATIVE Final    Comment:        The GeneXpert MRSA Assay (FDA approved for NASAL specimens only), is one component of a comprehensive MRSA colonization surveillance program. It is not intended to diagnose MRSA infection nor to guide or monitor treatment for MRSA infections. RESULT CALLED TO, READ BACK BY AND VERIFIED WITH: C. MASLINZ 1252 09.17.17 N. MORRIS      Radiology Studies: No results found.   Scheduled Meds: . cefTRIAXone (ROCEPHIN)  IV  2 g Intravenous Q24H  . diltiazem  120 mg Oral Q12H  . Influenza vac split  quadrivalent PF  0.5 mL Intramuscular Tomorrow-1000  . insulin aspart  0-15 Units Subcutaneous Q4H  . metoprolol tartrate  25 mg Oral BID  . mometasone-formoterol  2 puff Inhalation BID  . mupirocin ointment  1 application Nasal BID  . pneumococcal 23 valent vaccine  0.5 mL Intramuscular Tomorrow-1000  . predniSONE  40 mg Oral Q breakfast   Followed by  . [START ON 01/13/2016] predniSONE  30 mg Oral Q breakfast   Followed by  . [START ON 01/16/2016] predniSONE  20 mg Oral Q breakfast   Followed by  . [START ON 01/18/2016] predniSONE  10 mg Oral Q breakfast  . rivaroxaban  20 mg Oral Q supper  . tiotropium  18 mcg Inhalation Daily  . vancomycin  1,250 mg Intravenous Q24H   Continuous Infusions:    LOS: 6 days   Latrelle Dodrill, MD [facility] Pager [phone]  If 7PM-7AM, please contact night-coverage [email or web address] Password [facility] 01/12/2016, 8:31 AM

## 2016-01-13 LAB — GLUCOSE, CAPILLARY
Glucose-Capillary: 113 mg/dL — ABNORMAL HIGH (ref 65–99)
Glucose-Capillary: 88 mg/dL (ref 65–99)
Glucose-Capillary: 179 mg/dL — ABNORMAL HIGH (ref 65–99)
Glucose-Capillary: 283 mg/dL — ABNORMAL HIGH (ref 65–99)
Glucose-Capillary: 206 mg/dL — ABNORMAL HIGH (ref 65–99)

## 2016-01-13 LAB — CBC
HCT: 31.9 % — ABNORMAL LOW (ref 36.0–46.0)
Hemoglobin: 8.9 g/dL — ABNORMAL LOW (ref 12.0–15.0)
MCH: 25.3 pg — AB (ref 26.0–34.0)
MCHC: 27.9 g/dL — AB (ref 30.0–36.0)
MCV: 90.6 fL (ref 78.0–100.0)
PLATELETS: 372 10*3/uL (ref 150–400)
RBC: 3.52 MIL/uL — ABNORMAL LOW (ref 3.87–5.11)
RDW: 15.9 % — AB (ref 11.5–15.5)
WBC: 14.6 10*3/uL — ABNORMAL HIGH (ref 4.0–10.5)

## 2016-01-13 MED ORDER — BISMUTH SUBSALICYLATE 262 MG/15ML PO SUSP
30.0000 mL | ORAL | Status: DC | PRN
  Filled 2016-01-13: qty 118

## 2016-01-13 MED ORDER — ALBUTEROL SULFATE (2.5 MG/3ML) 0.083% IN NEBU
2.5000 mg | INHALATION_SOLUTION | Freq: Three times a day (TID) | RESPIRATORY_TRACT | Status: DC
  Administered 2016-01-14: 2.5 mg via RESPIRATORY_TRACT
  Filled 2016-01-13: qty 3

## 2016-01-13 MED ORDER — BACID PO TABS
2.0000 | ORAL_TABLET | Freq: Three times a day (TID) | ORAL | Status: DC
  Administered 2016-01-13 – 2016-01-14 (×3): 2 via ORAL
  Filled 2016-01-13 (×6): qty 2

## 2016-01-13 NOTE — Progress Notes (Signed)
Patient refused BiPAP use QHS, RT will continue to monitor as needed.

## 2016-01-13 NOTE — Progress Notes (Signed)
Received new orders for swallow evaluation for potential upgrade. Unfortunately unable to see this date. Will f/u.   Ferdinand Lango MA, CCC-SLP [phone]

## 2016-01-13 NOTE — Progress Notes (Signed)
PROGRESS NOTE [facility]   [name]   RCV:[id number] DOB: 1946/03/18  DOA: 01/06/2016 PCP: [name], MD   Brief Narrative:   70 yo female former smoker with known COPD and chronic resp failure on home O2 at 3-4 l./m with multiple comorbidities with DM, chronic anemia , chronic renal disease-stage III , cerebral aneurysm s/p coiling in 2009 , Afib with RVR 09/2015 s/p cardioversion and begin anticoagulation (xarelto) found lethargic at home by family. EMS brought to ER at [facility] found to be hypoxic and hypercarbic . Placed on BIPAP without sigificant improvement requiring intubation and vent support. ABG showed severe hypercarbia with PCO2 >100. Most recent admission was in June 2017 with similar presentation. Patient transferred from the ICU to [facility] on 9/22. Breathing well, on O2 at baseline. Patient was complaining of weakness, and inpatient rehab did an evaluation, recommending admission. Patient refused to go, because she is not ready for physical therapy.   Subjective:  Patient seen and examined at bedside. Patient much better this morning, feels her strength is getting back. Continues with frequent soft loose stools. On nasal cannula 4 L, back to baseline. No acute events overnight.   Assessment & Plan:   70 yo female with severe COPD on home O2 at 3-4l/m admitted with COPD exacerbation +HCAP w/ hypercarbic/hypoxic RF requiring intubation. Course complicated by AF RVR (of which she has history) and acute renal failure (hx CKD III). Now with generalize weakness due to deconditioning.   ? Diarrhea, per nursing staff is multiples frequents episodes of soft loose stools. Elevated WBC but this could be related to prednisone use.  - bismuth  - Lactobacillus  - Monitor    Acute on Chronic  Hypercarbic/Hypoxic Resp Failure with severe CAP and acute pulmonary edema > resolved. COPD exacerbation (home symbicort/spiriva) Home O2 at 4l/m > resolved ?OSA/OHS  not on CPAP at home    Continue Spiriva and Dulera in hospital (OK to use symbicort at home again) Albuterol prn Prednisone taper Titrate O2 for O2 > 90%  HTN  Echo 01/2015 EF 55-60%, mild dilated LA  Atrial Fib (dx 09/2015 ) cardioversion 09/2015 , cerebral angiogram d/t prev aneurysm coliing 2009, no contraindication for anticoagualtion d/ch on xarelto Telemetry monitoring  Continue AC  Continue dilt Continue metoprolol oral Diuresis PRN   Acute on chronic Kidney disease (scr 09/2015 1.0 ) Creatinine back to baseline  Hypokalemia - resolved  Monitor BMP Replace electrolytes as needed  GERD PPI     Anemia of chronic disease - H/H stable  Monitor cbc - on AC  HCAP > MRSA and haemophilus in sputum Will complete a total of 7 day of abx   DM- stable  Regular insulin SSI here Consider restarting glargine  Deconditioning and dysphagia  Rehab medicine recommended inp rehab  I discussed with the patient the facility to go to inpatient rehabilitation versus home health PT patient prefers go home and have PT at home. Patient state that her sister is [employer] fundraiser and she is in a safe environment. We'll consult social worker and care management for further recommendations, even the concern of home safety. Patient to be discharged tomorrow after proper arrangements are made. Will change diet to regular heart healthy/ carb modified.   DVT prophylaxis: Xarelto Code Status: DNR Family Communication: None at bedside, plan discussed with patient in details. Disposition Plan: d/c in  to either inp rehab or home with [facility] and PT  Consultants:   PCCM  Procedures:  None  Antimicrobials:  Ceftriaxone 9/18  Vanco 9/19   Objective: Vitals:   01/12/16 2011 01/12/16 2012 01/13/16 0510 01/13/16 0855  BP:   (!) 128/44   Pulse:   (!) 56 60  Resp:   18 18  Temp:   97.9 F (36.6 C)   TempSrc:   Oral   SpO2: (!) 89% 92% 95% 92%  Weight:   109 kg (240 lb 3.2 oz)   Height:        Intake/Output Summary (Last 24  hours) at 01/13/16 1054 Last data filed at 01/13/16 0900  Gross per 24 hour  Intake             1678 ml  Output             2628 ml  Net             -950 ml   Filed Weights   01/11/16 0548 01/12/16 0435 01/13/16 0510  Weight: 109.4 kg (241 lb 2.9 oz) 108.9 kg (240 lb 1.6 oz) 109 kg (240 lb 3.2 oz)    Examination:  General exam: Appears calm and comfortable, Obese Respiratory system: Good air entry, CTA. No wheezes, crackles or rhonchi  Cardiovascular system: S1 & S2 heard, RRR., murmurs, rubs or gallops Gastrointestinal system: Abdomen obese is nondistended, soft and nontender. No organomegaly or masses. Central nervous system: Alert and oriented. No focal neurological deficits. Extremities: No pedal edema. Skin: No rashes, lesions or ulcer    Data Reviewed: I have personally reviewed following labs and imaging studies  CBC:  Recent Labs Lab 01/09/16 0753 01/10/16 0211 01/11/16 0334 01/12/16 0126 01/13/16 0341  WBC 12.5* 14.7* 17.9* 15.6* 14.6*  NEUTROABS  --  11.9*  --   --   --   HGB 9.0* 9.2* 9.8* 9.5* 8.9*  HCT 30.1* 32.5* 35.3* 34.6* 31.9*  MCV 87.2 90.5 92.2 92.3 90.6  PLT 331 348 389 372 372   Basic Metabolic Panel:  Recent Labs Lab 01/07/16 1215 01/07/16 1626 01/08/16 0456 01/08/16 1445 01/09/16 0753 01/09/16 1647 01/10/16 0211 01/11/16 0334 01/12/16 0126  NA  --   --  143 137 139 142 143 140 143  K  --   --  3.0* 4.3 3.5 4.0 3.6 3.6 3.8  CL  --   --  93* 92* 92* 93* 96* 94* 95*  CO2  --   --  34* 32 37* 39* 40* 40* 39*  GLUCOSE  --   --  135* 213* 277* 204* 120* 90 100*  BUN  --   --  39* 40* 50* 47* 43* 34* 29*  CREATININE  --   --  1.45* 1.34* 1.36* 1.22* 1.16* 1.13* 1.10*  CALCIUM  --   --  8.9 8.4* 8.5* 8.8* 8.5* 8.9 9.0  MG 1.6* 1.5* 2.3 2.0  --   --   --   --   --   PHOS 2.2* 2.3* 3.4 4.1  --   --   --   --   --    GFR: Estimated Creatinine Clearance: 63.6 mL/min (by C-G formula based on SCr of 1.1 mg/dL (H)). Liver Function Tests: No  results for input(s): AST, ALT, ALKPHOS, BILITOT, PROT, ALBUMIN in the last 168 hours. No results for input(s): LIPASE, AMYLASE in the last 168 hours. No results for input(s): AMMONIA in the last 168 hours. Coagulation Profile: No results for input(s): INR, PROTIME in the last 168 hours. Cardiac Enzymes:  Recent Labs Lab 01/06/16 1353 01/06/16 2146  TROPONINI 0.03* <0.03   BNP (last 3 results) No results for input(s): PROBNP in the last 8760 hours. HbA1C: No results for input(s): HGBA1C in the last 72 hours. CBG:  Recent Labs Lab 01/12/16 1621 01/12/16 1941 01/12/16 2353 01/13/16 0511 01/13/16 0801  GLUCAP 237* 159* 183* 113* 88   Lipid Profile: No results for input(s): CHOL, HDL, LDLCALC, TRIG, CHOLHDL, LDLDIRECT in the last 72 hours. Thyroid Function Tests: No results for input(s): TSH, T4TOTAL, FREET4, T3FREE, THYROIDAB in the last 72 hours. Anemia Panel: No results for input(s): VITAMINB12, FOLATE, FERRITIN, TIBC, IRON, RETICCTPCT in the last 72 hours. Sepsis Labs:  Recent Labs Lab 01/07/16 1215 01/08/16 0456  PROCALCITON 0.95 0.82    Recent Results (from the past 240 hour(s))  Culture, blood (routine x 2)     Status: None   Collection Time: 01/06/16  9:43 AM  Result Value Ref Range Status   Specimen Description BLOOD RIGHT HAND  Final   Special Requests BOTTLES DRAWN AEROBIC ONLY  4CC  Final   Culture NO GROWTH 5 DAYS  Final   Report Status 01/11/2016 FINAL  Final  Culture, blood (routine x 2)     Status: None   Collection Time: 01/06/16  9:51 AM  Result Value Ref Range Status   Specimen Description BLOOD RIGHT HAND  Final   Special Requests BOTTLES DRAWN AEROBIC ONLY  5CC  Final   Culture NO GROWTH 5 DAYS  Final   Report Status 01/11/2016 FINAL  Final  Culture, respiratory (tracheal aspirate)     Status: None   Collection Time: 01/06/16 10:10 AM  Result Value Ref Range Status   Specimen Description TRACHEAL ASPIRATE  Final   Special Requests NONE   Final   Gram Stain   Final    ABUNDANT WBC PRESENT, PREDOMINANTLY PMN ABUNDANT GRAM NEGATIVE COCCOBACILLI    Culture   Final    MODERATE HAEMOPHILUS INFLUENZAE BETA LACTAMASE POSITIVE FEW METHICILLIN RESISTANT STAPHYLOCOCCUS AUREUS    Report Status 01/09/2016 FINAL  Final   Organism ID, Bacteria METHICILLIN RESISTANT STAPHYLOCOCCUS AUREUS  Final      Susceptibility   Methicillin resistant staphylococcus aureus - MIC*    CIPROFLOXACIN >=8 RESISTANT Resistant     ERYTHROMYCIN >=8 RESISTANT Resistant     GENTAMICIN <=0.5 SENSITIVE Sensitive     OXACILLIN >=4 RESISTANT Resistant     TETRACYCLINE <=1 SENSITIVE Sensitive     VANCOMYCIN <=0.5 SENSITIVE Sensitive     TRIMETH/SULFA <=10 SENSITIVE Sensitive     CLINDAMYCIN <=0.25 SENSITIVE Sensitive     RIFAMPIN <=0.5 SENSITIVE Sensitive     Inducible Clindamycin NEGATIVE Sensitive     * FEW METHICILLIN RESISTANT STAPHYLOCOCCUS AUREUS  MRSA PCR Screening     Status: Abnormal   Collection Time: 01/06/16 10:48 AM  Result Value Ref Range Status   MRSA by PCR POSITIVE (A) NEGATIVE Final    Comment:        The GeneXpert MRSA Assay (FDA approved for NASAL specimens only), is one component of a comprehensive MRSA colonization surveillance program. It is not intended to diagnose MRSA infection nor to guide or monitor treatment for MRSA infections. RESULT CALLED TO, READ BACK BY AND VERIFIED WITH: C. MASLINZ 1252 [id number] N. MORRIS      Radiology Studies: No results found.   Scheduled Meds: . diltiazem  120 mg Oral Q12H  . insulin aspart  0-15 Units Subcutaneous Q4H  . metoprolol tartrate  25 mg Oral BID  . mometasone-formoterol  2 puff  Inhalation BID  . predniSONE  30 mg Oral Q breakfast   Followed by  . [START ON 01/16/2016] predniSONE  20 mg Oral Q breakfast   Followed by  . [START ON 01/18/2016] predniSONE  10 mg Oral Q breakfast  . rivaroxaban  20 mg Oral Q supper  . tiotropium  18 mcg Inhalation Daily   Continuous  Infusions:    LOS: 7 days   Latrelle Dodrill, MD [facility] Pager [phone]  If 7PM-7AM, please contact night-coverage [email or web address] Password [facility] 01/13/2016, 10:54 AM

## 2016-01-14 DIAGNOSIS — I482 Chronic atrial fibrillation, unspecified: Secondary | ICD-10-CM

## 2016-01-14 DIAGNOSIS — E119 Type 2 diabetes mellitus without complications: Secondary | ICD-10-CM

## 2016-01-14 DIAGNOSIS — J441 Chronic obstructive pulmonary disease with (acute) exacerbation: Secondary | ICD-10-CM

## 2016-01-14 LAB — CBC
HCT: 33.5 % — ABNORMAL LOW (ref 36.0–46.0)
HEMOGLOBIN: 9.5 g/dL — AB (ref 12.0–15.0)
MCH: 26 pg (ref 26.0–34.0)
MCHC: 28.4 g/dL — ABNORMAL LOW (ref 30.0–36.0)
MCV: 91.5 fL (ref 78.0–100.0)
PLATELETS: 384 10*3/uL (ref 150–400)
RBC: 3.66 MIL/uL — AB (ref 3.87–5.11)
RDW: 16.1 % — ABNORMAL HIGH (ref 11.5–15.5)
WBC: 12.9 10*3/uL — AB (ref 4.0–10.5)

## 2016-01-14 LAB — GLUCOSE, CAPILLARY
GLUCOSE-CAPILLARY: 105 mg/dL — AB (ref 65–99)
Glucose-Capillary: 140 mg/dL — ABNORMAL HIGH (ref 65–99)
Glucose-Capillary: 208 mg/dL — ABNORMAL HIGH (ref 65–99)
Glucose-Capillary: 90 mg/dL (ref 65–99)

## 2016-01-14 MED ORDER — DILTIAZEM HCL ER 120 MG PO CP12: 120.0000 mg | ORAL_CAPSULE | Freq: Two times a day (BID) | ORAL | 0 refills | Status: DC

## 2016-01-14 MED ORDER — MOMETASONE FURO-FORMOTEROL FUM 100-5 MCG/ACT IN AERO: 2.0000 | INHALATION_SPRAY | Freq: Two times a day (BID) | RESPIRATORY_TRACT | 0 refills | Status: DC

## 2016-01-14 MED ORDER — PREDNISONE 10 MG PO TABS: ORAL_TABLET | ORAL | Status: DC

## 2016-01-14 MED ORDER — RIVAROXABAN 20 MG PO TABS: 20.0000 mg | ORAL_TABLET | Freq: Every day | ORAL | 0 refills | Status: DC

## 2016-01-14 MED ORDER — METOPROLOL TARTRATE 25 MG PO TABS: 25.0000 mg | ORAL_TABLET | Freq: Two times a day (BID) | ORAL | 0 refills | Status: AC

## 2016-01-14 MED ORDER — BACID PO TABS: 2.0000 | ORAL_TABLET | Freq: Three times a day (TID) | ORAL | 0 refills | Status: AC

## 2016-01-14 NOTE — Progress Notes (Signed)
Rehab admissions - I spoke briefly with patient.  She confirms that she wants to discharge home with assistance from her sister once she is medically ready.  She does not want to admit to acute inpatient rehab at this time.  I will sign off for inpatient rehab at this point.  Call me for questions.  #[phone]

## 2016-01-14 NOTE — Discharge Summary (Signed)
Physician Discharge Summary  ALDOUSat M Murdy  AOZ:[id number]  DOB: 10/19/1945  DOA: 01/06/2016 PCP: Katy ApoPOLITE,RONALD D, MD  Admit date: 01/06/2016 Discharge date: 01/14/2016  Admitted From: Home Disposition: Home  Recommendations for Outpatient Follow-up:  1. Follow up with PCP in 1 week 2. Please obtain BMP/CBC in one week 3. Needs to establish care with pulmonologist   Home Health: Yes with physical therapy Equipment/Devices: Oxygen, 3:1   Discharge Condition: Stable CODE STATUS: Full Diet recommendation: Heart Healthy / Carb Modified  Brief/Interim Summary:  70 yo female former smoker with known COPD and chronic resp failure on home O2 at 3-4 l./m with multiple comorbidities with DM, chronic anemia , chronic renal disease-stage III , cerebral aneurysm s/p coiling in 2009 , Afib with RVR 09/2015 s/p cardioversion and begin anticoagulation (xarelto) found lethargic at home by family. EMS [facility] found to be hypoxic and hypercarbic . Placed on BIPAP without sigificant improvement, requiring intubation and vent support. ABG showed severe hypercarbia with PCO2 >100. Most recent admission was in June 2017 with similar presentation. Patient was extubated on 9/21 and was tolerating 5 L of oxygen via the left. Saturations remained above 89%. Patient also received a course of antibiotics for suspected pneumonia and MRSA positive. [facility]. Breathing well, on O2 at baseline. Patient was complaining of weakness, and inpatient rehabilitation was recommended. Patient decided to have rehabilitation home. Patient now is back to baseline oxygen at 4 L and satting well. Patient will be discharged home today with home health PT and respiratory help at home.   Subjective:  Patient seen and examined this morning. She report significantly improving in her breathing and her weakness. Stool are less frequent and more form. Patient has no complaints today. No  acute events overnight. Patient desires to be discharge home with health aide and physical therapy at home.  Discharge Diagnoses:   Acute on Chronic Hypercarbic/Hypoxic Resp Failure with severe CAP and acute pulmonary edema  COPD exacerbation (home symbicort/spiriva) Home O2 at 4l/m > RESOLVED ?OSA/OHS not on CPAP at home  - Continue inhaled steroid - Continues Spiriva - Continue albuterol as needed - Chronic prednisone 2 days of 20 mg then decrease dose to 10 mg until seen by PMD - Keep O2 sat above 89% - Need to establish care with the pulmonologist - Dr. Nehemiah SettlePolite was suggested  HTN  Echo 01/2015 EF 55-60%, mild dilated LA  Atrial Fib (dx 09/2015 ) cardioversion 09/2015 ,cerebral angiogram d/t prev aneurysm coliing 2009, - Continuous Xarelto for A. fib - Continue home medications for hypertension - Follow-up with PMD  Acute on chronic Kidney disease (scr 09/2015 1.0 ) Creatinine back to baseline  Hypokalemia - resolved  - Monitor BMP in 3 weeks  GERD stable - PPI    Anemia of chronic disease - H/H stable  - Monitor cbc in 6 months - Iron supplementation  HCAP > MRSA and haemophilus in sputum - antibiotic treatment completed  DM- stable  - Continue home medications -  Metformin - Follow up with PMD - Check A1c  Deconditioning and dysphagia improved now on regular diet. - Home health rehabilitation   Discharge Instructions  Discharge Instructions    Call MD for:  difficulty breathing, headache or visual disturbances    Complete by:  As directed    Call MD for:  extreme fatigue    Complete by:  As directed    Call MD for:  hives    Complete by:  As directed    Call MD for:  persistant dizziness or light-headedness    Complete by:  As directed    Call MD for:  persistant nausea and vomiting    Complete by:  As directed    Call MD for:  redness, tenderness, or signs of infection (pain, swelling, redness, odor or green/yellow discharge around incision site)     Complete by:  As directed    Call MD for:  severe uncontrolled pain    Complete by:  As directed    Call MD for:  temperature >100.4    Complete by:  As directed    Diet - low sodium heart healthy    Complete by:  As directed    Discharge instructions    Complete by:  As directed    Discharge instructions    Complete by:  As directed    Follow up with PMD in 1 week  Needs to establish care with Pulmonary medicine Physical therapy   Face-to-face encounter (required for Medicare/Medicaid patients)    Complete by:  As directed    I Latrelle Dodrill certify that this patient is under my care and that I, or a nurse practitioner or physician's assistant working with me, had a face-to-face encounter that meets the physician face-to-face encounter requirements with this patient on 01/14/2016. The encounter with the patient was in whole, or in part for the following medical condition(s) which is the primary reason for home health care (List medical condition):  COPD exacerbation   The encounter with the patient was in whole, or in part, for the following medical condition, which is the primary reason for home health care:  COPD   I certify that, based on my findings, the following services are medically necessary home health services:   Physical therapy Nursing     Reason for Medically Necessary Home Health Services:   Skilled Nursing- Teaching of Disease Process/Symptom Management Therapy- Therapeutic Exercises to Increase Strength and Endurance     My clinical findings support the need for the above services:  Shortness of breath with activity   Further, I certify that my clinical findings support that this patient is homebound due to:  Shortness of Breath with activity   Home Health    Complete by:  As directed    To provide the following care/treatments:   PT Home Health Aide Respiratory Care     Increase activity slowly    Complete by:  As directed        Medication List    STOP taking  these medications   atenolol 25 MG tablet Commonly known as:  TENORMIN   budesonide-formoterol 80-4.5 MCG/ACT inhaler Commonly known as:  SYMBICORT   diltiazem 120 MG 24 hr capsule Commonly known as:  CARDIZEM CD   ipratropium-albuterol 0.5-2.5 (3) MG/3ML Soln Commonly known as:  DUONEB     TAKE these medications   acetaminophen 325 MG tablet Commonly known as:  TYLENOL Take 1 tablet (325 mg total) by mouth every 4 (four) hours as needed for mild pain, moderate pain, fever or headache.   Calcium 600-200 MG-UNIT tablet Take 1 tablet by mouth daily.   CENTRUM SILVER ADULT 50+ PO Take 1 tablet by mouth daily.   diltiazem 120 MG 12 hr capsule Commonly known as:  CARDIZEM SR Take 1 capsule (120 mg total) by mouth every 12 (twelve) hours.   ferrous sulfate 325 (65 FE) MG tablet Take 325  mg by mouth daily with breakfast.   furosemide 20 MG tablet Commonly known as:  LASIX Take 20 mg by mouth daily.   HYDROcodone-acetaminophen 5-325 MG tablet Commonly known as:  NORCO/VICODIN Take 1 tablet by mouth every 6 (six) hours as needed for moderate pain.   ibandronate 150 MG tablet Commonly known as:  BONIVA Take 150 mg by mouth every 30 (thirty) days. Take in the morning with a full glass of water, on an empty stomach, and do not take anything else by mouth or lie down for the next 30 min.   insulin aspart 100 UNIT/ML injection Commonly known as:  novoLOG Inject 0-15 Units into the skin every 4 (four) hours.   insulin glargine 100 UNIT/ML injection Commonly known as:  LANTUS Inject 0.05 mLs (5 Units total) into the skin daily.   lactobacillus acidophilus Tabs tablet Take 2 tablets by mouth 3 (three) times daily.   LORazepam 0.5 MG tablet Commonly known as:  ATIVAN Take 1 tablet (0.5 mg total) by mouth every 8 (eight) hours as needed for anxiety.   metoprolol tartrate 25 MG tablet Commonly known as:  LOPRESSOR Take 1 tablet (25 mg total) by mouth 2 (two) times daily.    mometasone-formoterol 100-5 MCG/ACT Aero Commonly known as:  DULERA Inhale 2 puffs into the lungs 2 (two) times daily.   oxybutynin 5 MG tablet Commonly known as:  DITROPAN Take 0.5 tablets (2.5 mg total) by mouth 2 (two) times daily.   OXYGEN Inhale 2.5 L into the lungs continuous.   pantoprazole 40 MG tablet Commonly known as:  PROTONIX Take 1 tablet (40 mg total) by mouth daily.   polyethylene glycol packet Commonly known as:  MIRALAX / GLYCOLAX Take 17 g by mouth daily.   predniSONE 10 MG tablet Commonly known as:  DELTASONE Please take   20 mg daily for 2 days, then 10 mg daily until seen by PMD. What changed:  additional instructions   PROAIR HFA 108 (90 Base) MCG/ACT inhaler Generic drug:  albuterol Inhale 2 puffs into the lungs every 6 (six) hours as needed. For shortness of breath   rivaroxaban 20 MG Tabs tablet Commonly known as:  XARELTO Take 1 tablet (20 mg total) by mouth daily with supper. Start tomorrow on 6/14, already received dose during hospital stay today   senna-docusate 8.6-50 MG tablet Commonly known as:  Senokot-S Take 2 tablets by mouth at bedtime.   tiotropium 18 MCG inhalation capsule Commonly known as:  SPIRIVA Place 18 mcg into inhaler and inhale daily.   traZODone 50 MG tablet Commonly known as:  DESYREL Take 50 mg by mouth at bedtime.      Follow-up Information    [facility].   Why:  They will provide your home health care at your home Contact information: [address] [place] [address] [phone]        [name], MD Follow up on 01/23/2016.   Specialty:  Internal Medicine Why:  @10 :00 Contact information: [address] [place] [address] [phone]        [name], MD. Schedule an appointment as soon as possible for a visit in 1 week(s).   Specialty:  Internal Medicine Contact information: [address] [place]  [address] [phone]          Allergies  Allergen Reactions  . Gold-Containing Drug Products Other (See Comments)    Blisters   . Tape Dermatitis  Consultations:  Pulmonary critical care   Procedures/Studies: Dg Chest Port 1 View  Result Date: 01/09/2016 CLINICAL DATA:  Hypoxia EXAM: PORTABLE CHEST 1 VIEW COMPARISON:  January 08, 2016 FINDINGS: Endotracheal tube tip is 4.2 cm above the carina. Nasogastric tube tip and side port are below the diaphragm. No pneumothorax. There is a small right pleural effusion. There is mild interstitial edema, stable. There is no appreciable airspace consolidation. Heart is borderline enlarged with mild pulmonary venous hypertension. No adenopathy evident. There is calcification in the aortic arch. IMPRESSION: Tube positions as described without pneumothorax. Persistent right pleural effusion with interstitial edema consistent with a degree of congestive heart failure. No airspace consolidation. Stable cardiac silhouette. There is aortic atherosclerosis. Electronically Signed   By: Bretta Bang III M.D.   On: 01/09/2016 07:13   Dg Chest Port 1 View  Result Date: 01/08/2016 CLINICAL DATA:  Intubation. EXAM: PORTABLE CHEST 1 VIEW COMPARISON:  Nineteen 2017. FINDINGS: Endotracheal tube and NG tube in stable position. Cardiomegaly with pulmonary vascular prominence and bilateral pulmonary interstitial prominence increased from prior exam. Findings are consistent with worsening congestive heart failure. Bibasilar pneumonia cannot be excluded. Small right pleural effusion. No pneumothorax. IMPRESSION: 1.  Lines and tubes stable position. 2. Congestive heart failure with progressive pulmonary interstitial edema and small right pleural effusion . Bibasilar pneumonia cannot be excluded. Electronically Signed   By: Maisie Fus  Register   On: 01/08/2016 07:45   Dg Chest Port 1 View  Result Date: 01/07/2016 CLINICAL DATA:  Respiratory failure. EXAM: PORTABLE  CHEST 1 VIEW COMPARISON:  10/06/2015. FINDINGS: Endotracheal tube and NG tube in stable position. Heart size normal. Low lung volumes with bibasilar atelectasis and/or infiltrates. Findings have progressed slightly from prior exam. Small bilateral pleural effusions. Biapical pleural thickening noted consistent with scarring. Old left rib fractures. IMPRESSION: 1. Lines and tubes in stable position. 2. Low lung volumes with bibasilar atelectasis and/or infiltrates. Bibasilar pneumonia cannot be excluded. Findings have progressed from prior exam . Small bilateral pleural effusions. Electronically Signed   By: Maisie Fus  Register   On: 01/07/2016 06:43   Dg Chest Portable 1 View  Result Date: 01/06/2016 CLINICAL DATA:  Patient with history of respiratory distress. EXAM: PORTABLE CHEST 1 VIEW COMPARISON:  Chest radiograph 01/06/2016 FINDINGS: ET tube terminates in the mid trachea. Enteric tube courses inferior to the diaphragm. Monitoring leads overlie the patient. Stable cardiac and mediastinal contours. Bilateral mid lower lung perihilar interstitial pulmonary opacities, grossly unchanged. Apical emphysematous change. Biapical pleural parenchymal thickening. Old left rib fractures. IMPRESSION: Grossly unchanged mid and lower lung interstitial opacities which may represent atelectasis, pneumonia or edema. Electronically Signed   By: Annia Belt M.D.   On: 01/06/2016 07:47   Dg Chest Port 1 View  Result Date: 01/06/2016 CLINICAL DATA:  Dyspnea today.  On BiPAP. EXAM: PORTABLE CHEST 1 VIEW COMPARISON:  Radiographs 09/26/2015 FINDINGS: Lung bases excluded from the field of view. The lungs are hyperinflated with chronic bronchial thickening. Patchy opacities at both lung bases. Cardiomediastinal contours are unchanged. There is chronic biapical pleural parenchymal scarring. Remote left rib fractures. IMPRESSION: Patchy bibasilar opacities may be atelectasis or pneumonia, pulmonary edema is felt less likely. Background  emphysema with bronchial thickening and hyperinflation. Electronically Signed   By: Rubye Oaks M.D.   On: 01/06/2016 06:06   Dg Abd Portable 1v  Result Date: 01/06/2016 CLINICAL DATA:  Orogastric tube placement EXAM: PORTABLE ABDOMEN - 1 VIEW COMPARISON:  Portable exam 1215 hours without priors for comparison FINDINGS:  Tip of orogastric tube projects over gastric antrum. Visualized bowel gas pattern normal. Lung bases clear. IMPRESSION: Tip of orogastric tube projects over gastric antrum. Electronically Signed   By: Ulyses Southward M.D.   On: 01/06/2016 12:37       Discharge Exam: Vitals:   01/13/16 2213 01/14/16 0437  BP: (!) 151/52 (!) 140/37  Pulse: 76 (!) 58  Resp:    Temp: 98.1 F (36.7 C) (!) 95 F (35 C)   Vitals:   01/14/16 0601 01/14/16 0841 01/14/16 0842 01/14/16 0844  BP:      Pulse:      Resp:      Temp:      TempSrc:      SpO2:  95% 95% 95%  Weight: 112.4 kg (247 lb 12.8 oz)     Height:        General: Pt is alert, awake, not in acute distress Cardiovascular: RRR, S1/S2 +, no rubs, no gallops Respiratory: Distant but CTA bilaterally, no wheezing, no rhonchi Abdominal:  Obese, soft, NT, ND, bowel sounds + Extremities: Trace pitting edema bilateral, no cyanosis   The results of significant diagnostics from this hospitalization (including imaging, microbiology, ancillary and laboratory) are listed below for reference.     Microbiology: Recent Results (from the past 240 hour(s))  Culture, blood (routine x 2)     Status: None   Collection Time: 01/06/16  9:43 AM  Result Value Ref Range Status   Specimen Description BLOOD RIGHT HAND  Final   Special Requests BOTTLES DRAWN AEROBIC ONLY  4CC  Final   Culture NO GROWTH 5 DAYS  Final   Report Status 01/11/2016 FINAL  Final  Culture, blood (routine x 2)     Status: None   Collection Time: 01/06/16  9:51 AM  Result Value Ref Range Status   Specimen Description BLOOD RIGHT HAND  Final   Special Requests BOTTLES  DRAWN AEROBIC ONLY  5CC  Final   Culture NO GROWTH 5 DAYS  Final   Report Status 01/11/2016 FINAL  Final  Culture, respiratory (tracheal aspirate)     Status: None   Collection Time: 01/06/16 10:10 AM  Result Value Ref Range Status   Specimen Description TRACHEAL ASPIRATE  Final   Special Requests NONE  Final   Gram Stain   Final    ABUNDANT WBC PRESENT, PREDOMINANTLY PMN ABUNDANT GRAM NEGATIVE COCCOBACILLI    Culture   Final    MODERATE HAEMOPHILUS INFLUENZAE BETA LACTAMASE POSITIVE FEW METHICILLIN RESISTANT STAPHYLOCOCCUS AUREUS    Report Status 01/09/2016 FINAL  Final   Organism ID, Bacteria METHICILLIN RESISTANT STAPHYLOCOCCUS AUREUS  Final      Susceptibility   Methicillin resistant staphylococcus aureus - MIC*    CIPROFLOXACIN >=8 RESISTANT Resistant     ERYTHROMYCIN >=8 RESISTANT Resistant     GENTAMICIN <=0.5 SENSITIVE Sensitive     OXACILLIN >=4 RESISTANT Resistant     TETRACYCLINE <=1 SENSITIVE Sensitive     VANCOMYCIN <=0.5 SENSITIVE Sensitive     TRIMETH/SULFA <=10 SENSITIVE Sensitive     CLINDAMYCIN <=0.25 SENSITIVE Sensitive     RIFAMPIN <=0.5 SENSITIVE Sensitive     Inducible Clindamycin NEGATIVE Sensitive     * FEW METHICILLIN RESISTANT STAPHYLOCOCCUS AUREUS  MRSA PCR Screening     Status: Abnormal   Collection Time: 01/06/16 10:48 AM  Result Value Ref Range Status   MRSA by PCR POSITIVE (A) NEGATIVE Final    Comment:        The GeneXpert  MRSA Assay (FDA approved for NASAL specimens only), is one component of a comprehensive MRSA colonization surveillance program. It is not intended to diagnose MRSA infection nor to guide or monitor treatment for MRSA infections. RESULT CALLED TO, READ BACK BY AND VERIFIED WITH: C. MASLINZ 1252 09.17.17 N. MORRIS      Labs: BNP (last 3 results)  Recent Labs  02/07/15 0638 09/25/15 0435 01/06/16 0538  BNP 132.1* 95.4 192.3*   Basic Metabolic Panel:  Recent Labs Lab 01/07/16 1626  01/08/16 0456  01/08/16 1445 01/09/16 0753 01/09/16 1647 01/10/16 0211 01/11/16 0334 01/12/16 0126  NA  --   < > 143 137 139 142 143 140 143  K  --   < > 3.0* 4.3 3.5 4.0 3.6 3.6 3.8  CL  --   < > 93* 92* 92* 93* 96* 94* 95*  CO2  --   < > 34* 32 37* 39* 40* 40* 39*  GLUCOSE  --   < > 135* 213* 277* 204* 120* 90 100*  BUN  --   < > 39* 40* 50* 47* 43* 34* 29*  CREATININE  --   < > 1.45* 1.34* 1.36* 1.22* 1.16* 1.13* 1.10*  CALCIUM  --   < > 8.9 8.4* 8.5* 8.8* 8.5* 8.9 9.0  MG 1.5*  --  2.3 2.0  --   --   --   --   --   PHOS 2.3*  --  3.4 4.1  --   --   --   --   --   < > = values in this interval not displayed. Liver Function Tests: No results for input(s): AST, ALT, ALKPHOS, BILITOT, PROT, ALBUMIN in the last 168 hours. No results for input(s): LIPASE, AMYLASE in the last 168 hours. No results for input(s): AMMONIA in the last 168 hours. CBC:  Recent Labs Lab 01/10/16 0211 01/11/16 0334 01/12/16 0126 01/13/16 0341 01/14/16 0403  WBC 14.7* 17.9* 15.6* 14.6* 12.9*  NEUTROABS 11.9*  --   --   --   --   HGB 9.2* 9.8* 9.5* 8.9* 9.5*  HCT 32.5* 35.3* 34.6* 31.9* 33.5*  MCV 90.5 92.2 92.3 90.6 91.5  PLT 348 389 372 372 384   Cardiac Enzymes: No results for input(s): CKTOTAL, CKMB, CKMBINDEX, TROPONINI in the last 168 hours. BNP: Invalid input(s): POCBNP CBG:  Recent Labs Lab 01/13/16 2034 01/14/16 0022 01/14/16 0440 01/14/16 0755 01/14/16 1146  GLUCAP 206* 208* 90 105* 140*   D-Dimer No results for input(s): DDIMER in the last 72 hours. Hgb A1c No results for input(s): HGBA1C in the last 72 hours. Lipid Profile No results for input(s): CHOL, HDL, LDLCALC, TRIG, CHOLHDL, LDLDIRECT in the last 72 hours. Thyroid function studies No results for input(s): TSH, T4TOTAL, T3FREE, THYROIDAB in the last 72 hours.  Invalid input(s): FREET3 Anemia work up No results for input(s): VITAMINB12, FOLATE, FERRITIN, TIBC, IRON, RETICCTPCT in the last 72 hours. Urinalysis    Component Value  Date/Time   COLORURINE YELLOW 01/06/2016 1047   APPEARANCEUR CLEAR 01/06/2016 1047   LABSPEC 1.013 01/06/2016 1047   PHURINE 5.0 01/06/2016 1047   GLUCOSEU 250 (A) 01/06/2016 1047   HGBUR NEGATIVE 01/06/2016 1047   BILIRUBINUR NEGATIVE 01/06/2016 1047   KETONESUR NEGATIVE 01/06/2016 1047   PROTEINUR NEGATIVE 01/06/2016 1047   UROBILINOGEN 0.2 02/06/2015 1906   NITRITE NEGATIVE 01/06/2016 1047   LEUKOCYTESUR NEGATIVE 01/06/2016 1047   Sepsis Labs Invalid input(s): PROCALCITONIN,  WBC,  LACTICIDVEN Microbiology Recent Results (  from the past 240 hour(s))  Culture, blood (routine x 2)     Status: None   Collection Time: 01/06/16  9:43 AM  Result Value Ref Range Status   Specimen Description BLOOD RIGHT HAND  Final   Special Requests BOTTLES DRAWN AEROBIC ONLY  4CC  Final   Culture NO GROWTH 5 DAYS  Final   Report Status 01/11/2016 FINAL  Final  Culture, blood (routine x 2)     Status: None   Collection Time: 01/06/16  9:51 AM  Result Value Ref Range Status   Specimen Description BLOOD RIGHT HAND  Final   Special Requests BOTTLES DRAWN AEROBIC ONLY  5CC  Final   Culture NO GROWTH 5 DAYS  Final   Report Status 01/11/2016 FINAL  Final  Culture, respiratory (tracheal aspirate)     Status: None   Collection Time: 01/06/16 10:10 AM  Result Value Ref Range Status   Specimen Description TRACHEAL ASPIRATE  Final   Special Requests NONE  Final   Gram Stain   Final    ABUNDANT WBC PRESENT, PREDOMINANTLY PMN ABUNDANT GRAM NEGATIVE COCCOBACILLI    Culture   Final    MODERATE HAEMOPHILUS INFLUENZAE BETA LACTAMASE POSITIVE FEW METHICILLIN RESISTANT STAPHYLOCOCCUS AUREUS    Report Status 01/09/2016 FINAL  Final   Organism ID, Bacteria METHICILLIN RESISTANT STAPHYLOCOCCUS AUREUS  Final      Susceptibility   Methicillin resistant staphylococcus aureus - MIC*    CIPROFLOXACIN >=8 RESISTANT Resistant     ERYTHROMYCIN >=8 RESISTANT Resistant     GENTAMICIN <=0.5 SENSITIVE Sensitive      OXACILLIN >=4 RESISTANT Resistant     TETRACYCLINE <=1 SENSITIVE Sensitive     VANCOMYCIN <=0.5 SENSITIVE Sensitive     TRIMETH/SULFA <=10 SENSITIVE Sensitive     CLINDAMYCIN <=0.25 SENSITIVE Sensitive     RIFAMPIN <=0.5 SENSITIVE Sensitive     Inducible Clindamycin NEGATIVE Sensitive     * FEW METHICILLIN RESISTANT STAPHYLOCOCCUS AUREUS  MRSA PCR Screening     Status: Abnormal   Collection Time: 01/06/16 10:48 AM  Result Value Ref Range Status   MRSA by PCR POSITIVE (A) NEGATIVE Final    Comment:        The GeneXpert MRSA Assay (FDA approved for NASAL specimens only), is one component of a comprehensive MRSA colonization surveillance program. It is not intended to diagnose MRSA infection nor to guide or monitor treatment for MRSA infections. RESULT CALLED TO, READ BACK BY AND VERIFIED WITH: C. MASLINZ 1252 09.17.17 N. MORRIS     Time coordinating discharge: Over 30 minutes  SIGNED: Latrelle Dodrill, MD  [facility] 01/14/2016, 2:18 PM Pager   If 7PM-7AM, please contact night-coverage [email or web address] Password [email or web address]

## 2016-01-14 NOTE — Progress Notes (Signed)
PT Cancellation Note  Patient Details Name: MIKEYLA MUSIC MRN: [id number] DOB: 02-01-1946   Cancelled Treatment:    Reason Eval/Treat Not Completed: Medical issues which prohibited therapy.  Vitals which are too low for safe mobility.  Will check later as time and pt allow.   Ivar Drape 01/14/2016, 9:16 AM    Samul Dada, PT MS Acute Rehab Dept. Number: [facility] [id number] and [facility] [phone]

## 2016-01-14 NOTE — Progress Notes (Signed)
Speech Language Pathology Treatment: Dysphagia  Patient Details Name: [name] MRN: [id number] DOB: 14-Jul-1945 Today's Date: 01/14/2016 Time: 1552-0802 SLP Time Calculation (min) (ACUTE ONLY): 10 min  Assessment / Plan / Recommendation Clinical Impression  F/u diet tolerance assessment revealed no overt s/s of aspiration with current diet (advanced to regular, thin liquids per MD today) and independent use of swallowing precautions. No SLP f/u indicated.    HPI HPI: 70 yo female with severe COPD on home O2 at 3-4l/m admitted with COPD exacerbation +HCAP w/ hypercarbic/hypoxic RF requiring intubation 9/17-9/20. Course complicated by AF RVR (of which she has history) and acute renal failure (hx CKD III).       SLP Plan  All goals met     Recommendations  Diet recommendations: Regular;Thin liquid Liquids provided via: Cup;Straw Medication Administration: Whole meds with liquid Supervision: Patient able to self feed Compensations: Slow rate;Small sips/bites Postural Changes and/or Swallow Maneuvers: Seated upright 90 degrees;Upright 30-60 min after meal                Oral Care Recommendations: Oral care BID Plan: All goals met       GO             Gabriel Rainwater Barry, CCC-SLP [phone]    My Rinke Meryl 01/14/2016, 3:01 PM

## 2016-01-14 NOTE — Consult Note (Signed)
   [facility] Inpatient Consult   01/14/2016  FARRA NIKOLIC [birth year] [id number]   [facility] Care Management follow up. Chart reviewed.  Noted discharged home today with home health. Referred to [facility] for follow up post discharge.  Raiford Noble, MSN-Ed, RN,BSN [facility] Liaison [phone]

## 2016-01-14 NOTE — Care Management Note (Signed)
Case Management Note  Patient Details  Name: EVERLYN FARABAUGH MRN: [id number] Date of Birth: 17-Nov-1945  Subjective/Objective:       Admitted with Acute Resp Failure             Action/Plan: Patient lives alone but plans to have her sister stay with her; PCP is Dr Renford Dills; no Pulmonologist at this time, was seen by Dr Sherene Sires in the past. Private insurance with Medicare; [facility]; pt reports no problem getting her medication; she has home oxygen but needs 3:1 and a nebulizer machine;   Expected Discharge Date:  01/15/16               Expected Discharge Plan:  Home w Home Health Services  In-House Referral:  Clinical Social Work  Discharge planning Services  CM Consult  Post Acute Care Choice:    Choice offered to:  Patient  DME Arranged:  3-N-1, Nebulizer/meds DME Agency:  [facility]  HH Arranged:  RN, Disease Management, PT, Nurse's Aide HH Agency:  [facility]  Status of Service:  In process, will continue to follow  Reola Mosher [phone] 01/14/2016, 11:28 AM

## 2016-01-17 ENCOUNTER — Other Ambulatory Visit: Payer: Self-pay

## 2016-01-17 NOTE — Patient Outreach (Signed)
New referral for transition of care: Reviewed EMR.  Placed call to patient. No answer. Left a message and requested a call back.  PLAN: Will continue to outreach patient.  Rowe Pavy, RN, BSN, CEN [facility] [employer] [phone]

## 2016-01-18 ENCOUNTER — Other Ambulatory Visit: Payer: Self-pay

## 2016-01-18 ENCOUNTER — Encounter (HOSPITAL_COMMUNITY): Payer: Self-pay | Admitting: Interventional Radiology

## 2016-01-18 NOTE — Patient Outreach (Signed)
Transition of care call: Second attempt to reach patient. Successful.  Spoke with patient who reports that she is doing well. States that she weak. Reports that her breathing is " not too bad". States that she has all her medications and is taking them as prescribed.  Reports that her sister is staying with her right now and that is helpful . Reports that she has follow up planned with primary MD on Monday.  Reviewed discharge instructions with patient and she is undecided it she will get a pulmonary specialist. Reports that she will talk to her primary MD about this.   Reports that she is active with [facility], physical therapist, and [facility]. Reports that bath aid is coming today.  Reports that physical therapy will come twice a week.    During call patient reports someone is at the door and she has to go. Patient agreed for me to call her on Monday and book a home visit. Unable to review all discharge medications as patient had to get off the phone.  [facility] CM Care Plan Problem One   Flowsheet Row Most Recent Value  Care Plan Problem One  Recent admission for COPD  Role Documenting the Problem One  Care Management Coordinator  Care Plan for Problem One  Active  THN Long Term Goal (31-90 days)  Patient will report no readmissions in the next 31 days.   THN Long Term Goal Start Date  01/18/16  Interventions for Problem One Long Term Goal  Reviewed discharge instructions and need for timely follow up with primary MD. Reviewed importance of taking all medications as prescribed,  THN CM Short Term Goal #1 (0-30 days)  Patient will follow up with primary MD on Monday 01/21/2016,  THN CM Short Term Goal #1 Start Date  01/18/16  Interventions for Short Term Goal #1  Reviewed importance of MD appointment.   THN CM Short Term Goal #2 (0-30 days)  Patient will verbalize understanding of COPD zones and when to call MD within the next 30 days.  Interventions for Short Term Goal #2   Reviewed importance of early recognition of symptoms.  Will outreach patient weekly and complete home visit.      PLAN: Will call back on Monday to schedule home visit. Asked for patient to be reassigned to me. Will send MD this note and barrier letter.   Rowe Pavy, RN, BSN, CEN [facility] [employer] [phone]

## 2016-01-21 ENCOUNTER — Other Ambulatory Visit: Payer: Self-pay

## 2016-01-21 NOTE — Patient Outreach (Signed)
Transition of care: Placed call to patient who reports that she was too short of breath today to go to primary MD appointment. States that after washing up she was too short of breath to go. Reports she rescheduled appointment for 01/29/2016.    PLAN: offered to do home visit for 10/2 at 1pm and patient has accepted.  Confirmed address and provided my contact information.  Rowe Pavy, RN, BSN, CEN [facility] [employer] [phone]

## 2016-01-22 ENCOUNTER — Other Ambulatory Visit: Payer: Self-pay

## 2016-01-22 NOTE — Patient Outreach (Signed)
[facility]) Care Management   01/22/2016  [name] 09-Dec-1945 [id number]  [name] is an 70 y.o. female Arrived for home visit. Sister Earma Reading present. Subjective: Patient reports that she continues to have difficulty with her breathing. Reports that she was supposed to go to MD office yesterday and was too weak and out of breath.  States that she gets very winded with any activity.  Reports that she occasionally uses her incentive spirometer ( able to pull about 500 ml only). Is on 4 liters nasal canula continuously. Reports dressing is a concern.  Reports that she is not able to take care of herself. Patients sister from [place] is staying with patient right now and assisting with daily care. Patient is active with home health RN and PT. Patient reports that she is considering selling her home and moving back to [place] to be near her family. Reports no family lives in [place]. Patient states that her nephew comes every 2 weeks to get her groceries. Patient reports that she lives in her living room where the TV is and uses a potty chair due to shortness of breath. Not currently driving. Reports bad experience at [facility] and does not want to go to an assistive living or nursing home.  Patient reports that she is able to take her medications as prescribed but states that her medications are very expensive.  Reports that she is on a waiting list for meals on wheels. ( reports the waiting list is 2 years long).  Patient reports struggles making ends meet.   Objective:  Found patient sitting on couch. This is also where she sleeps. Unable to take a step. Reports legs feel like they will collapse. Talking in complete sentences.  Vitals:   01/22/16 1340  BP: 120/60  Pulse: 83  Resp: 20  SpO2: 94%  Weight: 237 lb (107.5 kg)  Height: 1.702 m (5\' 7" )   Review of Systems  Constitutional: Positive for malaise/fatigue.  HENT: Negative.   Eyes:   Reports poor vision. States that she is planning to go to and have eyes examined and get some glasses.  Respiratory: Positive for shortness of breath.   Cardiovascular: Positive for leg swelling.  Gastrointestinal: Positive for diarrhea.  Genitourinary: Positive for frequency.  Musculoskeletal: Positive for falls.  Skin: Negative.   Neurological: Negative.   Endo/Heme/Allergies: Negative.   Psychiatric/Behavioral: Negative.     Physical Exam  Constitutional: She is oriented to person, place, and time. She appears well-developed and well-nourished.  Eyes:  Wearing glasses  Cardiovascular: Normal rate and intact distal pulses.   Respiratory: Effort normal and breath sounds normal.  Decreased breath sounds bilaterally.   GI: Soft. Bowel sounds are normal.  Musculoskeletal: Normal range of motion. She exhibits edema.  2 plus pitting edema up to legs.   Neurological: She is alert and oriented to person, place, and time.  Skin: Skin is warm and dry.  Feet without lesions or open skin  Psychiatric: She has a normal mood and affect. Her behavior is normal. Judgment and thought content normal.    Encounter Medications:   Outpatient Encounter Prescriptions as of 01/22/2016  Medication Sig Note  . albuterol (PROAIR HFA) 108 (90 BASE) MCG/ACT inhaler Inhale 2 puffs into the lungs every 6 (six) hours as needed. For shortness of breath   . budesonide-formoterol (SYMBICORT) 80-4.5 MCG/ACT inhaler Inhale 2 puffs into the lungs 2 (two) times daily.   . Calcium  600-200 MG-UNIT tablet Take 1 tablet by mouth daily.   Marland Kitchen diltiazem (CARDIZEM SR) 120 MG 12 hr capsule Take 1 capsule (120 mg total) by mouth every 12 (twelve) hours.   . furosemide (LASIX) 20 MG tablet Take 20 mg by mouth daily.   Marland Kitchen HYDROcodone-acetaminophen (NORCO/VICODIN) 5-325 MG tablet Take 1 tablet by mouth every 6 (six) hours as needed for moderate pain.   Marland Kitchen lactobacillus acidophilus (BACID) TABS tablet Take 2 tablets by mouth  3 (three) times daily. 01/22/2016: Takes once a day  . metFORMIN (GLUCOPHAGE) 500 MG tablet Take 2 tablets by mouth 2 (two) times daily with a meal.   . metoprolol tartrate (LOPRESSOR) 25 MG tablet Take 1 tablet (25 mg total) by mouth 2 (two) times daily.   . Multiple Vitamins-Minerals (CENTRUM SILVER ADULT 50+ PO) Take 1 tablet by mouth daily.   Marland Kitchen omeprazole (PRILOSEC) 20 MG capsule Take 20 mg by mouth daily.   . OXYGEN Inhale 4 L into the lungs continuous.    . predniSONE (DELTASONE) 10 MG tablet Please take   20 mg daily for 2 days, then 10 mg daily until seen by PMD.   . rivaroxaban (XARELTO) 20 MG TABS tablet Take 1 tablet (20 mg total) by mouth daily with supper.   . tiotropium (SPIRIVA) 18 MCG inhalation capsule Place 18 mcg into inhaler and inhale daily.    . traZODone (DESYREL) 50 MG tablet Take 50 mg by mouth at bedtime.   Marland Kitchen acetaminophen (TYLENOL) 325 MG tablet Take 1 tablet (325 mg total) by mouth every 4 (four) hours as needed for mild pain, moderate pain, fever or headache. (Patient not taking: Reported on 01/22/2016)   . ferrous sulfate 325 (65 FE) MG tablet Take 325 mg by mouth daily with breakfast.   . ibandronate (BONIVA) 150 MG tablet Take 150 mg by mouth every 30 (thirty) days. Take in the morning with a full glass of water, on an empty stomach, and do not take anything else by mouth or lie down for the next 30 min.   Marland Kitchen LORazepam (ATIVAN) 0.5 MG tablet Take 1 tablet (0.5 mg total) by mouth every 8 (eight) hours as needed for anxiety. (Patient not taking: Reported on 01/22/2016) 01/22/2016: Does not have this RX  . mometasone-formoterol (DULERA) 100-5 MCG/ACT AERO Inhale 2 puffs into the lungs 2 (two) times daily. (Patient not taking: Reported on 01/22/2016) 01/22/2016: Reports that she is waiting to talk to DR. Polite about this RX. Reports that this is very expensive.  Marland Kitchen oxybutynin (DITROPAN) 5 MG tablet Take 0.5 tablets (2.5 mg total) by mouth 2 (two) times daily. (Patient not taking:  Reported on 01/22/2016)   . pantoprazole (PROTONIX) 40 MG tablet Take 1 tablet (40 mg total) by mouth daily. (Patient not taking: Reported on 01/22/2016)   . polyethylene glycol (MIRALAX / GLYCOLAX) packet Take 17 g by mouth daily. (Patient not taking: Reported on 01/22/2016)   . senna-docusate (SENOKOT-S) 8.6-50 MG tablet Take 2 tablets by mouth at bedtime. (Patient not taking: Reported on 01/22/2016)    No facility-administered encounter medications on file as of 01/22/2016.     Functional Status:   In your present state of health, do you have any difficulty performing the following activities: 01/22/2016 01/11/2016  Hearing? Y N  Vision? Y N  Difficulty concentrating or making decisions? Malvin Johns  Walking or climbing stairs? Y N  Dressing or bathing? Y N  Doing errands, shopping? Malvin Johns  Preparing Food and eating ?  Y -  Using the Toilet? Y -  In the past six months, have you accidently leaked urine? Y -  Do you have problems with loss of bowel control? Y -  Managing your Medications? N -  Managing your Finances? N -  Housekeeping or managing your Housekeeping? Y -  Some recent data might be hidden    Fall/Depression Screening:    PHQ 2/9 Scores 01/22/2016  PHQ - 2 Score 0   Fall Risk  01/22/2016  Falls in the past year? Yes  Number falls in past yr: 2 or more  Injury with Fall? Yes  Risk Factor Category  High Fall Risk  Risk for fall due to : Impaired balance/gait;History of fall(s)  Follow up Falls prevention discussed    Assessment:   (1) reviewed [facility] program. Patient reports that she has new patient packet from hospital. Provided magnet and [facility] calendar. Reviewed consent and patient denies any changes needed. Provided my contact card. (2) fall risk. (3) recent admission for COPD (4) on waiting list for meals on wheels. (5) medications expense. (6) 2 plus pedal edema. (7) not checking CBG.  Plan:  (1) Reviewed 24 hour nurse line and encouraged patient to call if needed. (2)  active with physical therapy. Reviewed fall precautions and need to stand for a moment before stepping. (3) Reviewed COPD zones and need to recognize early symptoms and call for assistance. Reviewed need to consider pulmonary specialist as per discharge instructions. (4) referral to [facility] social worker to assist with food concerns. (5) referral to [facility] social worker to assist with education on COPD medications and medication cost. (6) encouraged patient to eat a low salt diet. She states she was told to eat salt because of sodium level was low. Encouraged patient to talk to MD about this.  (7) no working CBG meter. Does not know for sure if she should be monitoring. Encouraged patient to speak with primary MD about this at Office visit on 10/9.   Reviewed with patient goals and patient wants to avoid readmissions.  Reports terrible experience being on ventilator.  Will continue weekly transition of care calls. Will send MD this note.  Rowe Pavy, RN, BSN, CEN [facility] [employer] [phone]

## 2016-01-24 ENCOUNTER — Other Ambulatory Visit: Payer: Self-pay | Admitting: *Deleted

## 2016-01-24 NOTE — Patient Outreach (Signed)
[facility]) Care Management  [facility] Social Work  01/24/2016  [name] [birth year] [id number]  Subjective:   Request for assistance with meals on wheels.    Encounter Medications:  Outpatient Encounter Prescriptions as of 01/24/2016  Medication Sig Note  . acetaminophen (TYLENOL) 325 MG tablet Take 1 tablet (325 mg total) by mouth every 4 (four) hours as needed for mild pain, moderate pain, fever or headache. (Patient not taking: Reported on 01/22/2016)   . albuterol (PROAIR HFA) 108 (90 BASE) MCG/ACT inhaler Inhale 2 puffs into the lungs every 6 (six) hours as needed. For shortness of breath   . budesonide-formoterol (SYMBICORT) 80-4.5 MCG/ACT inhaler Inhale 2 puffs into the lungs 2 (two) times daily.   . Calcium 600-200 MG-UNIT tablet Take 1 tablet by mouth daily.   Marland Kitchen diltiazem (CARDIZEM SR) 120 MG 12 hr capsule Take 1 capsule (120 mg total) by mouth every 12 (twelve) hours.   . ferrous sulfate 325 (65 FE) MG tablet Take 325 mg by mouth daily with breakfast.   . furosemide (LASIX) 20 MG tablet Take 20 mg by mouth daily.   Marland Kitchen HYDROcodone-acetaminophen (NORCO/VICODIN) 5-325 MG tablet Take 1 tablet by mouth every 6 (six) hours as needed for moderate pain.   Marland Kitchen ibandronate (BONIVA) 150 MG tablet Take 150 mg by mouth every 30 (thirty) days. Take in the morning with a full glass of water, on an empty stomach, and do not take anything else by mouth or lie down for the next 30 min.   . lactobacillus acidophilus (BACID) TABS tablet Take 2 tablets by mouth 3 (three) times daily. 01/22/2016: Takes once a day  . LORazepam (ATIVAN) 0.5 MG tablet Take 1 tablet (0.5 mg total) by mouth every 8 (eight) hours as needed for anxiety. (Patient not taking: Reported on 01/22/2016) 01/22/2016: Does not have this RX  . metFORMIN (GLUCOPHAGE) 500 MG tablet Take 2 tablets by mouth 2 (two) times daily with a meal.   . metoprolol tartrate (LOPRESSOR) 25 MG tablet Take 1 tablet (25 mg total) by mouth 2  (two) times daily.   . mometasone-formoterol (DULERA) 100-5 MCG/ACT AERO Inhale 2 puffs into the lungs 2 (two) times daily. (Patient not taking: Reported on 01/22/2016) 01/22/2016: Reports that she is waiting to talk to DR. Polite about this RX. Reports that this is very expensive.  . Multiple Vitamins-Minerals (CENTRUM SILVER ADULT 50+ PO) Take 1 tablet by mouth daily.   Marland Kitchen omeprazole (PRILOSEC) 20 MG capsule Take 20 mg by mouth daily.   Marland Kitchen oxybutynin (DITROPAN) 5 MG tablet Take 0.5 tablets (2.5 mg total) by mouth 2 (two) times daily. (Patient not taking: Reported on 01/22/2016)   . OXYGEN Inhale 4 L into the lungs continuous.    . pantoprazole (PROTONIX) 40 MG tablet Take 1 tablet (40 mg total) by mouth daily. (Patient not taking: Reported on 01/22/2016)   . polyethylene glycol (MIRALAX / GLYCOLAX) packet Take 17 g by mouth daily. (Patient not taking: Reported on 01/22/2016)   . predniSONE (DELTASONE) 10 MG tablet Please take   20 mg daily for 2 days, then 10 mg daily until seen by PMD.   . rivaroxaban (XARELTO) 20 MG TABS tablet Take 1 tablet (20 mg total) by mouth daily with supper.   . senna-docusate (SENOKOT-S) 8.6-50 MG tablet Take 2 tablets by mouth at bedtime. (Patient not taking: Reported on 01/22/2016)   . tiotropium (SPIRIVA) 18 MCG inhalation capsule Place 18 mcg into inhaler and inhale daily.    Marland Kitchen  traZODone (DESYREL) 50 MG tablet Take 50 mg by mouth at bedtime.    No facility-administered encounter medications on file as of 01/24/2016.     Functional Status:  In your present state of health, do you have any difficulty performing the following activities: 01/22/2016 01/11/2016  Hearing? Y N  Vision? Y N  Difficulty concentrating or making decisions? Malvin Johns  Walking or climbing stairs? Y N  Dressing or bathing? Y N  Doing errands, shopping? Malvin Johns  Preparing Food and eating ? Y -  Using the Toilet? Y -  In the past six months, have you accidently leaked urine? Y -  Do you have problems with  loss of bowel control? Y -  Managing your Medications? N -  Managing your Finances? N -  Housekeeping or managing your Housekeeping? Y -  Some recent data might be hidden    Fall/Depression Screening:  PHQ 2/9 Scores 01/22/2016  PHQ - 2 Score 0    Assessment:  CSW was able to make initial contact with patient today to perform phone assessment, as well as assess and assist with social work needs and services.  CSW introduced self, explained role and types of services provided through [employer] Care Management [facility] Care Management).  CSW further explained to patient that CSW works with patient's RNCM, also with [facility] Care Management, . CSW then explained the reason for the call, indicating that patient's RNCM thought that patient would benefit from social work services and resources to assist with meals on wheels .  CSW obtained two HIPAA compliant identifiers from patient, which included patient's name and date of birth. Patient reports she is on the waiting list for Meals on Wheels. She is on the waiting list which is over 140 people and a 2 year wiat per patient. She has adequate finances to pay for food and has a nephew who comes every 2 weeks to get groceries for her; as well, her sister is currently staying with her.  CSW offered to mail her some community food pantry options but she denies the need for this.  "a local church has adopted me and brings me food often- they've added me to there prayer list too".   CSW will plan to follow up with [employer] to check her status again in 2 weeks as their system was down.     Plan:  Patient denies any psychosocial concerns or needs- she is hopeful to  eventually get linked with the Meals on Wheels program.    Reece Levy, MSW, LCSW Clinical Social Worker  [facility] [employer] [phone]

## 2016-01-28 ENCOUNTER — Other Ambulatory Visit: Payer: Self-pay

## 2016-01-28 ENCOUNTER — Other Ambulatory Visit: Payer: Self-pay | Admitting: *Deleted

## 2016-01-28 NOTE — Patient Outreach (Signed)
[facility]) Care Management  [facility] Social Work  01/28/2016  [name] [birth year] [id number]  Subjective:  Patient in need of meals on wheels referral.   Encounter Medications:  Outpatient Encounter Prescriptions as of 01/28/2016  Medication Sig Note  . acetaminophen (TYLENOL) 325 MG tablet Take 1 tablet (325 mg total) by mouth every 4 (four) hours as needed for mild pain, moderate pain, fever or headache. (Patient not taking: Reported on 01/22/2016)   . albuterol (PROAIR HFA) 108 (90 BASE) MCG/ACT inhaler Inhale 2 puffs into the lungs every 6 (six) hours as needed. For shortness of breath   . budesonide-formoterol (SYMBICORT) 80-4.5 MCG/ACT inhaler Inhale 2 puffs into the lungs 2 (two) times daily.   . Calcium 600-200 MG-UNIT tablet Take 1 tablet by mouth daily.   Marland Kitchen diltiazem (CARDIZEM SR) 120 MG 12 hr capsule Take 1 capsule (120 mg total) by mouth every 12 (twelve) hours.   . ferrous sulfate 325 (65 FE) MG tablet Take 325 mg by mouth daily with breakfast.   . furosemide (LASIX) 20 MG tablet Take 20 mg by mouth daily.   Marland Kitchen HYDROcodone-acetaminophen (NORCO/VICODIN) 5-325 MG tablet Take 1 tablet by mouth every 6 (six) hours as needed for moderate pain.   Marland Kitchen ibandronate (BONIVA) 150 MG tablet Take 150 mg by mouth every 30 (thirty) days. Take in the morning with a full glass of water, on an empty stomach, and do not take anything else by mouth or lie down for the next 30 min.   . lactobacillus acidophilus (BACID) TABS tablet Take 2 tablets by mouth 3 (three) times daily. 01/22/2016: Takes once a day  . LORazepam (ATIVAN) 0.5 MG tablet Take 1 tablet (0.5 mg total) by mouth every 8 (eight) hours as needed for anxiety. (Patient not taking: Reported on 01/22/2016) 01/22/2016: Does not have this RX  . metFORMIN (GLUCOPHAGE) 500 MG tablet Take 2 tablets by mouth 2 (two) times daily with a meal.   . metoprolol tartrate (LOPRESSOR) 25 MG tablet Take 1 tablet (25 mg total) by mouth 2 (two)  times daily.   . mometasone-formoterol (DULERA) 100-5 MCG/ACT AERO Inhale 2 puffs into the lungs 2 (two) times daily. (Patient not taking: Reported on 01/22/2016) 01/22/2016: Reports that she is waiting to talk to DR. Polite about this RX. Reports that this is very expensive.  . Multiple Vitamins-Minerals (CENTRUM SILVER ADULT 50+ PO) Take 1 tablet by mouth daily.   Marland Kitchen omeprazole (PRILOSEC) 20 MG capsule Take 20 mg by mouth daily.   Marland Kitchen oxybutynin (DITROPAN) 5 MG tablet Take 0.5 tablets (2.5 mg total) by mouth 2 (two) times daily. (Patient not taking: Reported on 01/22/2016)   . OXYGEN Inhale 4 L into the lungs continuous.    . pantoprazole (PROTONIX) 40 MG tablet Take 1 tablet (40 mg total) by mouth daily. (Patient not taking: Reported on 01/22/2016)   . polyethylene glycol (MIRALAX / GLYCOLAX) packet Take 17 g by mouth daily. (Patient not taking: Reported on 01/22/2016)   . predniSONE (DELTASONE) 10 MG tablet Please take   20 mg daily for 2 days, then 10 mg daily until seen by PMD.   . rivaroxaban (XARELTO) 20 MG TABS tablet Take 1 tablet (20 mg total) by mouth daily with supper.   . senna-docusate (SENOKOT-S) 8.6-50 MG tablet Take 2 tablets by mouth at bedtime. (Patient not taking: Reported on 01/22/2016)   . tiotropium (SPIRIVA) 18 MCG inhalation capsule Place 18 mcg into inhaler and inhale daily.    Marland Kitchen  traZODone (DESYREL) 50 MG tablet Take 50 mg by mouth at bedtime.    No facility-administered encounter medications on file as of 01/28/2016.     Functional Status:  In your present state of health, do you have any difficulty performing the following activities: 01/22/2016 01/11/2016  Hearing? Y N  Vision? Y N  Difficulty concentrating or making decisions? Malvin Johns  Walking or climbing stairs? Y N  Dressing or bathing? Y N  Doing errands, shopping? Malvin Johns  Preparing Food and eating ? Y -  Using the Toilet? Y -  In the past six months, have you accidently leaked urine? Y -  Do you have problems with loss of  bowel control? Y -  Managing your Medications? N -  Managing your Finances? N -  Housekeeping or managing your Housekeeping? Y -  Some recent data might be hidden    Fall/Depression Screening:  PHQ 2/9 Scores 01/22/2016  PHQ - 2 Score 0    Assessment: CSW spoke with patient and also confirmed with [employer] that she is on the waiting list for meals- funding is low and they have a long waiting list.   CSW updated patient who was already aware of this and confirmed she has adequate finances and ability to get food on her own through her nephew who comes bi-weekly to help with grocery shopping and other support through a local church.    She also is linked with [employer] CSW and patient were not able to identify any psychosocial barriers or needs at this time.   Plan:  CSW will close case and advise PCP and [facility] team of plan. PLease re-consult if further CSW needs are identified.    Reece Levy, MSW, LCSW Clinical Social Worker  [facility] [phone]

## 2016-01-28 NOTE — Patient Outreach (Signed)
Care Coordination:  Red Emmi alert for using inhaler more than 12 times in 24 hours.  Placed call to patient. No answer. Left a message for patient to return call.  Rowe Pavy, RN, BSN, CEN [facility] [employer] [phone]

## 2016-01-29 ENCOUNTER — Other Ambulatory Visit: Payer: Self-pay

## 2016-01-29 ENCOUNTER — Other Ambulatory Visit: Payer: Self-pay | Admitting: *Deleted

## 2016-01-29 NOTE — Patient Outreach (Addendum)
Transition of care: Placed call to patient for transition of care.  Patient reports that she continues to have severe shortness of breath. Patient reports that she went to MD office yesterday and she could not get a wheelchair to get into office. Reports that she sat in lobby for longer than 45 minutes past MD appointment waiting for a wheelchair to be available. Reports that she informed MD office and was not able to get a chair. Patient reports that she was running out of oxygen in her tanks and left and went back home after not being able to get to the second floor. States she was too short of breath to walk the distance to the elevator.  NOTED: When talking with patient on phone she sounds short of breath. Offered to arrange another appointment ASAP and speak with social worker about transportation arrangement.  Concern is patient will readmit if no follow up with primary MD.  Placed call to MD office and secured follow up appointment for 01/30/2016 at 10:15am. With Dr. [name]. Placed call to Reece Levy [facility] LCSW and arranged transportation with [employer]. Approved by Brooke Bonito. Placed call back to patient to inform.  Requested that sister attend MD appointment with patient to ensure she has enough oxygen tanks for transport back home.  Encouraged patient to secure a prescription for a wheelchair or transport chair. Patient very thankful for assistance in getting appointment and transportation for hospital follow up appointment.  PLAN: Will continue to follow patient for transition of care.  Rowe Pavy, RN, BSN, CEN [facility] [employer] [phone]

## 2016-01-29 NOTE — Patient Outreach (Signed)
[facility]) Care Management  01/29/2016  [name] December 29, 1945 [id number]   CSW rec'd call from Rowe Pavy, [facility] RNCM, indicating patient needs assistance with getting to/from MD appointment. Case discussed with [facility] leadership and will arrange for [facility] [employer] to provide wheelchair and Zenaida Niece transport to MD appointment tomorrow.   CSW advised RNCM who plans to relay to patient as well as encouraging her to ask MD for an order/RC for wheelchair.    Reece Levy, MSW, LCSW Clinical Social Worker  [facility] [employer] [phone]

## 2016-01-30 ENCOUNTER — Emergency Department (HOSPITAL_COMMUNITY): Payer: Medicare Other

## 2016-01-30 ENCOUNTER — Inpatient Hospital Stay (HOSPITAL_COMMUNITY)
Admission: EM | Admit: 2016-01-30 | Discharge: 2016-02-08 | DRG: 291 | Disposition: A | Discharge status: Hospice | Payer: Medicare Other | Attending: Internal Medicine | Admitting: Internal Medicine

## 2016-01-30 ENCOUNTER — Encounter (HOSPITAL_COMMUNITY): Payer: Self-pay | Admitting: Internal Medicine

## 2016-01-30 ENCOUNTER — Inpatient Hospital Stay (HOSPITAL_COMMUNITY): Payer: Medicare Other

## 2016-01-30 DIAGNOSIS — I10 Essential (primary) hypertension: Secondary | ICD-10-CM | POA: Diagnosis present

## 2016-01-30 DIAGNOSIS — I5041 Acute combined systolic (congestive) and diastolic (congestive) heart failure: Secondary | ICD-10-CM | POA: Diagnosis not present

## 2016-01-30 DIAGNOSIS — Z888 Allergy status to other drugs, medicaments and biological substances status: Secondary | ICD-10-CM

## 2016-01-30 DIAGNOSIS — I509 Heart failure, unspecified: Secondary | ICD-10-CM

## 2016-01-30 DIAGNOSIS — I480 Paroxysmal atrial fibrillation: Secondary | ICD-10-CM | POA: Diagnosis present

## 2016-01-30 DIAGNOSIS — E119 Type 2 diabetes mellitus without complications: Secondary | ICD-10-CM

## 2016-01-30 DIAGNOSIS — D509 Iron deficiency anemia, unspecified: Secondary | ICD-10-CM | POA: Diagnosis present

## 2016-01-30 DIAGNOSIS — Z6836 Body mass index (BMI) 36.0-36.9, adult: Secondary | ICD-10-CM

## 2016-01-30 DIAGNOSIS — R05 Cough: Secondary | ICD-10-CM

## 2016-01-30 DIAGNOSIS — I5031 Acute diastolic (congestive) heart failure: Secondary | ICD-10-CM | POA: Diagnosis not present

## 2016-01-30 DIAGNOSIS — E1165 Type 2 diabetes mellitus with hyperglycemia: Secondary | ICD-10-CM | POA: Diagnosis present

## 2016-01-30 DIAGNOSIS — E1122 Type 2 diabetes mellitus with diabetic chronic kidney disease: Secondary | ICD-10-CM | POA: Diagnosis present

## 2016-01-30 DIAGNOSIS — J9622 Acute and chronic respiratory failure with hypercapnia: Secondary | ICD-10-CM | POA: Diagnosis present

## 2016-01-30 DIAGNOSIS — E785 Hyperlipidemia, unspecified: Secondary | ICD-10-CM | POA: Diagnosis present

## 2016-01-30 DIAGNOSIS — N183 Chronic kidney disease, stage 3 unspecified: Secondary | ICD-10-CM

## 2016-01-30 DIAGNOSIS — Z66 Do not resuscitate: Secondary | ICD-10-CM

## 2016-01-30 DIAGNOSIS — J441 Chronic obstructive pulmonary disease with (acute) exacerbation: Secondary | ICD-10-CM | POA: Diagnosis present

## 2016-01-30 DIAGNOSIS — J9621 Acute and chronic respiratory failure with hypoxia: Secondary | ICD-10-CM | POA: Diagnosis present

## 2016-01-30 DIAGNOSIS — I5021 Acute systolic (congestive) heart failure: Secondary | ICD-10-CM | POA: Diagnosis not present

## 2016-01-30 DIAGNOSIS — R262 Difficulty in walking, not elsewhere classified: Secondary | ICD-10-CM

## 2016-01-30 DIAGNOSIS — J9600 Acute respiratory failure, unspecified whether with hypoxia or hypercapnia: Secondary | ICD-10-CM | POA: Diagnosis present

## 2016-01-30 DIAGNOSIS — I420 Dilated cardiomyopathy: Secondary | ICD-10-CM | POA: Diagnosis present

## 2016-01-30 DIAGNOSIS — E872 Acidosis: Secondary | ICD-10-CM | POA: Diagnosis present

## 2016-01-30 DIAGNOSIS — R0602 Shortness of breath: Secondary | ICD-10-CM

## 2016-01-30 DIAGNOSIS — J449 Chronic obstructive pulmonary disease, unspecified: Secondary | ICD-10-CM | POA: Diagnosis not present

## 2016-01-30 DIAGNOSIS — M069 Rheumatoid arthritis, unspecified: Secondary | ICD-10-CM | POA: Diagnosis present

## 2016-01-30 DIAGNOSIS — I130 Hypertensive heart and chronic kidney disease with heart failure and stage 1 through stage 4 chronic kidney disease, or unspecified chronic kidney disease: Secondary | ICD-10-CM | POA: Diagnosis present

## 2016-01-30 DIAGNOSIS — I5033 Acute on chronic diastolic (congestive) heart failure: Secondary | ICD-10-CM | POA: Diagnosis present

## 2016-01-30 DIAGNOSIS — R6889 Other general symptoms and signs: Secondary | ICD-10-CM

## 2016-01-30 DIAGNOSIS — IMO0002 Reserved for concepts with insufficient information to code with codable children: Secondary | ICD-10-CM | POA: Diagnosis present

## 2016-01-30 DIAGNOSIS — Y95 Nosocomial condition: Secondary | ICD-10-CM | POA: Diagnosis present

## 2016-01-30 DIAGNOSIS — Z79899 Other long term (current) drug therapy: Secondary | ICD-10-CM

## 2016-01-30 DIAGNOSIS — E876 Hypokalemia: Secondary | ICD-10-CM | POA: Diagnosis present

## 2016-01-30 DIAGNOSIS — J440 Chronic obstructive pulmonary disease with acute lower respiratory infection: Secondary | ICD-10-CM | POA: Diagnosis present

## 2016-01-30 DIAGNOSIS — J189 Pneumonia, unspecified organism: Secondary | ICD-10-CM | POA: Diagnosis present

## 2016-01-30 DIAGNOSIS — I5032 Chronic diastolic (congestive) heart failure: Secondary | ICD-10-CM | POA: Diagnosis not present

## 2016-01-30 DIAGNOSIS — K219 Gastro-esophageal reflux disease without esophagitis: Secondary | ICD-10-CM | POA: Diagnosis present

## 2016-01-30 DIAGNOSIS — Z7901 Long term (current) use of anticoagulants: Secondary | ICD-10-CM | POA: Diagnosis not present

## 2016-01-30 DIAGNOSIS — G4733 Obstructive sleep apnea (adult) (pediatric): Secondary | ICD-10-CM | POA: Diagnosis present

## 2016-01-30 DIAGNOSIS — F40240 Claustrophobia: Secondary | ICD-10-CM | POA: Diagnosis present

## 2016-01-30 DIAGNOSIS — I482 Chronic atrial fibrillation: Secondary | ICD-10-CM | POA: Diagnosis present

## 2016-01-30 DIAGNOSIS — Z515 Encounter for palliative care: Secondary | ICD-10-CM | POA: Diagnosis not present

## 2016-01-30 DIAGNOSIS — M6281 Muscle weakness (generalized): Secondary | ICD-10-CM

## 2016-01-30 DIAGNOSIS — W1830XA Fall on same level, unspecified, initial encounter: Secondary | ICD-10-CM | POA: Diagnosis present

## 2016-01-30 DIAGNOSIS — Z9119 Patient's noncompliance with other medical treatment and regimen: Secondary | ICD-10-CM | POA: Diagnosis not present

## 2016-01-30 DIAGNOSIS — I671 Cerebral aneurysm, nonruptured: Secondary | ICD-10-CM | POA: Diagnosis present

## 2016-01-30 DIAGNOSIS — I700 Atherosclerosis of aorta: Secondary | ICD-10-CM | POA: Diagnosis present

## 2016-01-30 DIAGNOSIS — J42 Unspecified chronic bronchitis: Secondary | ICD-10-CM | POA: Diagnosis not present

## 2016-01-30 DIAGNOSIS — J9811 Atelectasis: Secondary | ICD-10-CM | POA: Diagnosis present

## 2016-01-30 DIAGNOSIS — Z7951 Long term (current) use of inhaled steroids: Secondary | ICD-10-CM

## 2016-01-30 DIAGNOSIS — Z7984 Long term (current) use of oral hypoglycemic drugs: Secondary | ICD-10-CM

## 2016-01-30 DIAGNOSIS — Z9981 Dependence on supplemental oxygen: Secondary | ICD-10-CM

## 2016-01-30 DIAGNOSIS — F329 Major depressive disorder, single episode, unspecified: Secondary | ICD-10-CM | POA: Diagnosis present

## 2016-01-30 DIAGNOSIS — Z9289 Personal history of other medical treatment: Secondary | ICD-10-CM

## 2016-01-30 DIAGNOSIS — Z87891 Personal history of nicotine dependence: Secondary | ICD-10-CM

## 2016-01-30 DIAGNOSIS — Z5320 Procedure and treatment not carried out because of patient's decision for unspecified reasons: Secondary | ICD-10-CM | POA: Diagnosis present

## 2016-01-30 DIAGNOSIS — R059 Cough, unspecified: Secondary | ICD-10-CM

## 2016-01-30 DIAGNOSIS — E118 Type 2 diabetes mellitus with unspecified complications: Secondary | ICD-10-CM | POA: Diagnosis not present

## 2016-01-30 DIAGNOSIS — Z9011 Acquired absence of right breast and nipple: Secondary | ICD-10-CM

## 2016-01-30 DIAGNOSIS — I5040 Unspecified combined systolic (congestive) and diastolic (congestive) heart failure: Secondary | ICD-10-CM | POA: Diagnosis not present

## 2016-01-30 DIAGNOSIS — W19XXXA Unspecified fall, initial encounter: Secondary | ICD-10-CM

## 2016-01-30 DIAGNOSIS — J431 Panlobular emphysema: Secondary | ICD-10-CM | POA: Diagnosis not present

## 2016-01-30 LAB — I-STAT ARTERIAL BLOOD GAS, ED
Acid-Base Excess: 14 mmol/L — ABNORMAL HIGH (ref 0.0–2.0)
Bicarbonate: 43.5 mmol/L — ABNORMAL HIGH (ref 20.0–28.0)
O2 Saturation: 83 %
PCO2 ART: 81.5 mmHg — AB (ref 32.0–48.0)
PH ART: 7.337 — AB (ref 7.350–7.450)
TCO2: 46 mmol/L (ref 0–100)
pO2, Arterial: 55 mmHg — ABNORMAL LOW (ref 83.0–108.0)

## 2016-01-30 LAB — I-STAT TROPONIN, ED: Troponin i, poc: 0 ng/mL (ref 0.00–0.08)

## 2016-01-30 LAB — BASIC METABOLIC PANEL
ANION GAP: 12 (ref 5–15)
BUN: 13 mg/dL (ref 6–20)
CO2: 41 mmol/L — AB (ref 22–32)
Calcium: 9.8 mg/dL (ref 8.9–10.3)
Chloride: 85 mmol/L — ABNORMAL LOW (ref 101–111)
Creatinine, Ser: 1.38 mg/dL — ABNORMAL HIGH (ref 0.44–1.00)
GFR calc Af Amer: 44 mL/min — ABNORMAL LOW (ref >60)
GFR, EST NON AFRICAN AMERICAN: 38 mL/min — AB (ref >60)
GLUCOSE: 301 mg/dL — AB (ref 65–99)
POTASSIUM: 3.4 mmol/L — AB (ref 3.5–5.1)
Sodium: 138 mmol/L (ref 135–145)

## 2016-01-30 LAB — CBC
HEMATOCRIT: 34.2 % — AB (ref 36.0–46.0)
Hemoglobin: 9.6 g/dL — ABNORMAL LOW (ref 12.0–15.0)
MCH: 26.7 pg (ref 26.0–34.0)
MCHC: 28.1 g/dL — ABNORMAL LOW (ref 30.0–36.0)
MCV: 95 fL (ref 78.0–100.0)
PLATELETS: 272 10*3/uL (ref 150–400)
RBC: 3.6 MIL/uL — AB (ref 3.87–5.11)
RDW: 16.9 % — AB (ref 11.5–15.5)
WBC: 14.9 10*3/uL — AB (ref 4.0–10.5)

## 2016-01-30 LAB — BRAIN NATRIURETIC PEPTIDE: B NATRIURETIC PEPTIDE 5: 80.1 pg/mL (ref 0.0–100.0)

## 2016-01-30 MED ORDER — DILTIAZEM HCL ER 60 MG PO CP12
120.0000 mg | ORAL_CAPSULE | Freq: Two times a day (BID) | ORAL | Status: DC
  Administered 2016-01-31: 120 mg via ORAL
  Filled 2016-01-30: qty 2

## 2016-01-30 MED ORDER — FUROSEMIDE 10 MG/ML IJ SOLN
20.0000 mg | Freq: Two times a day (BID) | INTRAMUSCULAR | Status: DC
  Administered 2016-01-31 – 2016-02-02 (×5): 20 mg via INTRAVENOUS
  Filled 2016-01-30 (×5): qty 2

## 2016-01-30 MED ORDER — ONDANSETRON HCL 4 MG/2ML IJ SOLN: 4.0000 mg | Freq: Four times a day (QID) | INTRAMUSCULAR | Status: DC | PRN

## 2016-01-30 MED ORDER — PANTOPRAZOLE SODIUM 40 MG PO TBEC
40.0000 mg | DELAYED_RELEASE_TABLET | Freq: Every day | ORAL | Status: DC
  Administered 2016-01-31 – 2016-02-08 (×9): 40 mg via ORAL
  Filled 2016-01-30 (×9): qty 1

## 2016-01-30 MED ORDER — SODIUM CHLORIDE 0.9% FLUSH
3.0000 mL | Freq: Two times a day (BID) | INTRAVENOUS | Status: DC
  Administered 2016-01-31 – 2016-02-07 (×13): 3 mL via INTRAVENOUS

## 2016-01-30 MED ORDER — MOMETASONE FURO-FORMOTEROL FUM 100-5 MCG/ACT IN AERO
2.0000 | INHALATION_SPRAY | Freq: Two times a day (BID) | RESPIRATORY_TRACT | Status: DC
  Administered 2016-01-31 – 2016-02-05 (×8): 2 via RESPIRATORY_TRACT
  Filled 2016-01-30 (×2): qty 8.8

## 2016-01-30 MED ORDER — FUROSEMIDE 10 MG/ML IJ SOLN
40.0000 mg | Freq: Once | INTRAMUSCULAR | Status: AC
  Administered 2016-01-30: 40 mg via INTRAVENOUS
  Filled 2016-01-30: qty 4

## 2016-01-30 MED ORDER — ACETAMINOPHEN 650 MG RE SUPP: 650.0000 mg | Freq: Four times a day (QID) | RECTAL | Status: DC | PRN

## 2016-01-30 MED ORDER — BACID PO TABS
1.0000 | ORAL_TABLET | Freq: Every day | ORAL | Status: DC
  Administered 2016-01-31 – 2016-02-08 (×9): 1 via ORAL
  Filled 2016-01-30 (×9): qty 1

## 2016-01-30 MED ORDER — FERROUS SULFATE 325 (65 FE) MG PO TABS
325.0000 mg | ORAL_TABLET | Freq: Every day | ORAL | Status: DC
  Administered 2016-01-31 – 2016-02-08 (×9): 325 mg via ORAL
  Filled 2016-01-30 (×9): qty 1

## 2016-01-30 MED ORDER — ONDANSETRON HCL 4 MG PO TABS: 4.0000 mg | ORAL_TABLET | Freq: Four times a day (QID) | ORAL | Status: DC | PRN

## 2016-01-30 MED ORDER — POTASSIUM CHLORIDE CRYS ER 20 MEQ PO TBCR: 20.0000 meq | EXTENDED_RELEASE_TABLET | Freq: Once | ORAL | Status: DC

## 2016-01-30 MED ORDER — IPRATROPIUM-ALBUTEROL 0.5-2.5 (3) MG/3ML IN SOLN
3.0000 mL | RESPIRATORY_TRACT | Status: DC
  Administered 2016-01-31 – 2016-02-02 (×12): 3 mL via RESPIRATORY_TRACT
  Filled 2016-01-30 (×12): qty 3

## 2016-01-30 MED ORDER — ACETAMINOPHEN 325 MG PO TABS: 650.0000 mg | ORAL_TABLET | Freq: Four times a day (QID) | ORAL | Status: DC | PRN

## 2016-01-30 MED ORDER — SPIRONOLACTONE-HCTZ 25-25 MG PO TABS
1.0000 | ORAL_TABLET | Freq: Every day | ORAL | Status: DC
  Filled 2016-01-30: qty 1

## 2016-01-30 MED ORDER — VANCOMYCIN HCL 10 G IV SOLR
2000.0000 mg | Freq: Once | INTRAVENOUS | Status: AC
  Administered 2016-01-30: 2000 mg via INTRAVENOUS
  Filled 2016-01-30: qty 2000

## 2016-01-30 MED ORDER — INSULIN ASPART 100 UNIT/ML ~~LOC~~ SOLN
0.0000 [IU] | Freq: Three times a day (TID) | SUBCUTANEOUS | Status: DC
  Administered 2016-01-31: 2 [IU] via SUBCUTANEOUS
  Administered 2016-01-31 (×2): 3 [IU] via SUBCUTANEOUS
  Administered 2016-02-01: 1 [IU] via SUBCUTANEOUS
  Administered 2016-02-01: 2 [IU] via SUBCUTANEOUS
  Administered 2016-02-01: 3 [IU] via SUBCUTANEOUS
  Administered 2016-02-02: 2 [IU] via SUBCUTANEOUS
  Administered 2016-02-02: 5 [IU] via SUBCUTANEOUS
  Administered 2016-02-02 – 2016-02-03 (×2): 2 [IU] via SUBCUTANEOUS
  Administered 2016-02-03: 1 [IU] via SUBCUTANEOUS
  Administered 2016-02-03: 2 [IU] via SUBCUTANEOUS
  Administered 2016-02-04: 1 [IU] via SUBCUTANEOUS
  Administered 2016-02-04: 2 [IU] via SUBCUTANEOUS
  Administered 2016-02-04: 3 [IU] via SUBCUTANEOUS
  Administered 2016-02-05: 1 [IU] via SUBCUTANEOUS
  Administered 2016-02-05: 3 [IU] via SUBCUTANEOUS
  Administered 2016-02-05 – 2016-02-06 (×2): 2 [IU] via SUBCUTANEOUS
  Administered 2016-02-06 (×2): 5 [IU] via SUBCUTANEOUS
  Administered 2016-02-07: 3 [IU] via SUBCUTANEOUS
  Administered 2016-02-07: 5 [IU] via SUBCUTANEOUS
  Administered 2016-02-07 – 2016-02-08 (×2): 3 [IU] via SUBCUTANEOUS
  Administered 2016-02-08: 5 [IU] via SUBCUTANEOUS

## 2016-01-30 MED ORDER — VANCOMYCIN HCL 10 G IV SOLR
1250.0000 mg | INTRAVENOUS | Status: AC
  Administered 2016-01-31 – 2016-02-05 (×6): 1250 mg via INTRAVENOUS
  Filled 2016-01-30 (×7): qty 1250

## 2016-01-30 MED ORDER — HYDROCODONE-ACETAMINOPHEN 5-325 MG PO TABS
1.0000 | ORAL_TABLET | Freq: Four times a day (QID) | ORAL | Status: DC | PRN
  Administered 2016-01-31 – 2016-02-07 (×10): 1 via ORAL
  Filled 2016-01-30 (×10): qty 1

## 2016-01-30 MED ORDER — DEXTROSE 5 % IV SOLN
2.0000 g | Freq: Once | INTRAVENOUS | Status: AC
  Administered 2016-01-30: 2 g via INTRAVENOUS
  Filled 2016-01-30: qty 2

## 2016-01-30 MED ORDER — RIVAROXABAN 20 MG PO TABS
20.0000 mg | ORAL_TABLET | Freq: Every day | ORAL | Status: DC
  Administered 2016-01-31 – 2016-02-05 (×6): 20 mg via ORAL
  Filled 2016-01-30 (×6): qty 1

## 2016-01-30 MED ORDER — METOPROLOL TARTRATE 25 MG PO TABS
25.0000 mg | ORAL_TABLET | Freq: Two times a day (BID) | ORAL | Status: DC
  Administered 2016-01-31 – 2016-02-08 (×17): 25 mg via ORAL
  Filled 2016-01-30 (×17): qty 1

## 2016-01-30 MED ORDER — IPRATROPIUM-ALBUTEROL 0.5-2.5 (3) MG/3ML IN SOLN: 3.0000 mL | RESPIRATORY_TRACT | Status: DC | PRN

## 2016-01-30 MED ORDER — ALBUTEROL (5 MG/ML) CONTINUOUS INHALATION SOLN
10.0000 mg/h | INHALATION_SOLUTION | Freq: Once | RESPIRATORY_TRACT | Status: AC
  Administered 2016-01-30: 10 mg/h via RESPIRATORY_TRACT
  Filled 2016-01-30: qty 20

## 2016-01-30 MED ORDER — DEXTROSE 5 % IV SOLN
2.0000 g | INTRAVENOUS | Status: AC
  Administered 2016-01-31 – 2016-02-05 (×6): 2 g via INTRAVENOUS
  Filled 2016-01-30 (×7): qty 2

## 2016-01-30 NOTE — Progress Notes (Signed)
  Pharmacy Antibiotic Note  [name] is a 70 y.o. female admitted on 01/30/2016 with pneumonia.  Pharmacy has been consulted for vancomycin and cefepime  Dosing. Pt has 2 day hx of dyspnea. Uses 3L O2 at home. O2 sats 75% per EMT. Recently hospitalized for similar complaint.  Plan: Vancomycin 2000 mg IV x1 Vancomycin 1250 mg IV q24h Cefepime 2g IV q24h Monitor clinical course and renal function  Height: 5\' 7"  (170.2 cm) Weight: 240 lb (108.9 kg) IBW/kg (Calculated) : 61.6  Temp (24hrs), Avg:98.5 F (36.9 C), Min:98.5 F (36.9 C), Max:98.5 F (36.9 C)   Recent Labs Lab 01/30/16 1910  WBC 14.9*  CREATININE 1.38*    Estimated Creatinine Clearance: 48.2 mL/min (by C-G formula based on SCr of 1.38 mg/dL (H)).   Clearance appears stable  Allergies  Allergen Reactions  . Gold-Containing Drug Products Other (See Comments)    Blisters   . Tape Dermatitis    Antimicrobials this admission:  Vanc 10/11 >. Zosyn 10/11 >>  Dose adjustments this admission:  N/A  Microbiology results:  N/A Thank you for allowing pharmacy to be a part of this patient's care.  12/11 01/30/2016 9:18 PM

## 2016-01-30 NOTE — ED Notes (Signed)
Patient transported to CT 

## 2016-01-30 NOTE — Patient Outreach (Signed)
[facility]) Care Management  01/30/2016  [name] Jul 09, 1945 [id number]   CSW rec'd word that her transportation service failed to bring a wheelchair to transport her in for the PCP appointment today.  CSW contacted PCP and have made appointment for tomorrow at 10:45am.  CSW contacted transportation and this has been arranged again via [facility] [employer]. [name], with [employer] has assured CSW he will make sure the Zenaida Niece comes with a wheelchair for her use.  CSW contacted [facility] RNCM and patient to relay the above- patient appreciative of "our efforts and assistance".  Reece Levy, MSW, LCSW Clinical Social Worker  [facility] [employer] [phone]

## 2016-01-30 NOTE — H&P (Signed)
History and Physical    [name]:[id number] DOB: 01/03/1946 DOA: 01/30/2016  PCP: [name], MD  Patient coming from: Home   Chief Complaint: Shortness of breath   HPI: [name] is a 70 y.o. female admitted 3 weeks ago for respiratory failure and encephalopathy with known hx of sleep apnea non compliant with CPAP, chronic afib, COPD, and DM, presents with worsening shortness of breath. Patient states she has chronic shortness of breath which had acutely worsened today with increasing lower extremity edema. and chest tightness. Early in the morning at her house, patient also had a fall and hit her head but did not lose consciousness after she lost her balance. Patient still has mild occipital headache, CT head is pending. CXR shows congestion and mild pleural effusion with possible infiltrates. Patient on exam, is mildly short of breath but able to complete sentences. JVD is elevated with bilateral lower extremity edema and mild wheezing. Since CXR was showing possible infiltrates patient was started on antibiotics for pneumonia . Patient also has clinical features consistent with CHF and has been given lasix 40mg  and placed on oxygen mask. Patient was admitted for further managemt of acue respiratory failure possibly a combination of CHF and COPD with possible pneumonia.   ED Course: chloride 85, Cr 1.38, glucose 301, WBC 14.9, hgb 9.6 see hix of present illness.  While in the ED, she was started on IV antibiotics cefepime and vancomycin, lasix 40mg , and nebs.    Review of Systems: As per HPI, rest all negative.   Past Medical History:  Diagnosis Date  . Acute respiratory failure (HCC) 02/05/2017  . Adrenal disorder, other    "adrenal Incidentaloma"  . Anemia   . Aneurysm (HCC)    x2  . Arthritis   . Asthma   . Atrial fibrillation (HCC)   . Brain aneurysm    have a "giant aneurysm, Dr. has stented and coiled in past x 4  . Bronchitis    history of  .  Cerebral aneurysm   . Complication of anesthesia    Has woken up during surgery before  . COPD (chronic obstructive pulmonary disease) (HCC)    Dr. 02/07/2017, uses 2.5L of o2 as needed  . Depression    due to loss of spouse 6 years ago  . Diabetes mellitus    metformin  . Emphysema of lung (HCC)    requested anethesia consult   . GERD (gastroesophageal reflux disease)    omeprazole  . Hypertension   . Obesity   . Rheumatoid arthritis(714.0)    has had for approx. 40years, sees Dr. Bedelia Person  . Shortness of breath   . Sleep apnea    does not use cpap  . Urinary tract infection    history of    Past Surgical History:  Procedure Laterality Date  . BRAIN SURGERY     coiling and stenting  . BREAST SURGERY  02/28/11   right partial mastectomy   . CARDIOVERSION N/A 09/29/2015   Procedure: CARDIOVERSION;  Surgeon: 13/9/12, MD;  Location: [facility] OR;  Service: Cardiovascular;  Laterality: N/A;  . HAND SURGERY     bilateral  . HAND SURGERY     x 2  . IR GENERIC HISTORICAL  10/01/2015   IR 3D INDEPENDENT WKST 10/01/2015 12/01/2015, MD [facility]-INTERV RAD  . MASTECTOMY, PARTIAL  02/28/2011   Procedure: MASTECTOMY PARTIAL;  Surgeon: Julieanne Cotton, MD;  Location: [facility] OR;  Service: General;  Laterality:  Right;  RIGHT PARTIAL MASTECTOMY  WITH NEEDLE LOCALIZATION  . TUBAL LIGATION       reports that she has quit smoking. Her smoking use included Cigarettes. She has a 60.00 pack-year smoking history. She has never used smokeless tobacco. She reports that she drinks about 0.6 oz of alcohol per week . She reports that she does not use drugs.  Allergies  Allergen Reactions  . Gold-Containing Drug Products Other (See Comments)    Blisters   . Tape Dermatitis    Family History  Problem Relation Age of Onset  . Heart disease Mother   . Cancer Father     lung  . Cancer Sister     breast    Prior to Admission medications   Medication Sig Start Date End Date Taking? Authorizing Provider    acetaminophen (TYLENOL) 325 MG tablet Take 1 tablet (325 mg total) by mouth every 4 (four) hours as needed for mild pain, moderate pain, fever or headache. 09/28/15  Yes Starleen Arms, MD  albuterol (PROAIR HFA) 108 (90 BASE) MCG/ACT inhaler Inhale 2 puffs into the lungs every 6 (six) hours as needed. For shortness of breath   Yes Historical Provider, MD  budesonide-formoterol (SYMBICORT) 80-4.5 MCG/ACT inhaler Inhale 2 puffs into the lungs 2 (two) times daily.   Yes Historical Provider, MD  Calcium 600-200 MG-UNIT tablet Take 1 tablet by mouth daily.   Yes Historical Provider, MD  diltiazem (CARDIZEM SR) 120 MG 12 hr capsule Take 1 capsule (120 mg total) by mouth every 12 (twelve) hours. 01/14/16  Yes Lenox Ponds, MD  ferrous sulfate 325 (65 FE) MG tablet Take 325 mg by mouth daily with breakfast.   Yes Historical Provider, MD  furosemide (LASIX) 20 MG tablet Take 20 mg by mouth daily.   Yes Historical Provider, MD  HYDROcodone-acetaminophen (NORCO/VICODIN) 5-325 MG tablet Take 1 tablet by mouth every 6 (six) hours as needed for moderate pain.   Yes Historical Provider, MD  lactobacillus acidophilus (BACID) TABS tablet Take 2 tablets by mouth 3 (three) times daily. Patient taking differently: Take 1 tablet by mouth daily.  01/14/16  Yes Lenox Ponds, MD  metFORMIN (GLUCOPHAGE) 500 MG tablet Take 2 tablets by mouth 2 (two) times daily with a meal.   Yes Historical Provider, MD  metoprolol tartrate (LOPRESSOR) 25 MG tablet Take 1 tablet (25 mg total) by mouth 2 (two) times daily. 01/14/16  Yes Lenox Ponds, MD  Multiple Vitamins-Minerals (CENTRUM SILVER ADULT 50+ PO) Take 1 tablet by mouth daily.   Yes Historical Provider, MD  omeprazole (PRILOSEC) 20 MG capsule Take 20 mg by mouth daily.   Yes Historical Provider, MD  OXYGEN Inhale 4 L into the lungs continuous.    Yes Historical Provider, MD  predniSONE (DELTASONE) 10 MG tablet Please take   20 mg daily for 2 days, then 10 mg  daily until seen by PMD. Patient taking differently: Take 10 mg by mouth daily.  01/14/16  Yes Lenox Ponds, MD  rivaroxaban (XARELTO) 20 MG TABS tablet Take 1 tablet (20 mg total) by mouth daily with supper. 01/14/16  Yes Lenox Ponds, MD  spironolactone-hydrochlorothiazide (ALDACTAZIDE) 25-25 MG tablet Take 1 tablet by mouth daily.   Yes Historical Provider, MD  tiotropium (SPIRIVA) 18 MCG inhalation capsule Place 18 mcg into inhaler and inhale daily.    Yes Historical Provider, MD  traZODone (DESYREL) 50 MG tablet Take 50 mg by mouth at bedtime.   Yes Historical  Provider, MD  LORazepam (ATIVAN) 0.5 MG tablet Take 1 tablet (0.5 mg total) by mouth every 8 (eight) hours as needed for anxiety. Patient not taking: Reported on 01/22/2016 09/28/15   Leana Roe Elgergawy, MD  mometasone-formoterol (DULERA) 100-5 MCG/ACT AERO Inhale 2 puffs into the lungs 2 (two) times daily. Patient not taking: Reported on 01/22/2016 01/14/16   Lenox Ponds, MD    Physical Exam: Vitals:   01/30/16 1739 01/30/16 1830 01/30/16 1944 01/30/16 2216  BP:  121/88 (!) 136/54 (!) 156/54  Pulse:  (!) 126 (!) 133 (!) 126  Resp:  (!) 27 20 22   Temp:      TempSrc:      SpO2: 95% 93% 90% 90%  Weight:      Height:          Vitals:   01/30/16 1739 01/30/16 1830 01/30/16 1944 01/30/16 2216  BP:  121/88 (!) 136/54 (!) 156/54  Pulse:  (!) 126 (!) 133 (!) 126  Resp:  (!) 27 20 22   Temp:      TempSrc:      SpO2: 95% 93% 90% 90%  Weight:      Height:       Constitutional:  Mildly obese, mildly sob, not in distress.  Eyes: Anicteric, no pallor  ENMT: No discharge from the ears, eyes, nose, and mouth  Neck:Elevated JVD. No rigidity.  Respiratory: CTA, mild wheezes bilaterally  or rhonchi Cardiovascular: No murmurs, rubs, or gallops  Abdomen: Soft, non tender, and non distended  Musculoskeletal: good ROM, moves all extremities bilateral LE edema.  Skin: Bruise on the right shoulder  Neurological: CN 2-12  grossly intact Psychiatric: Judgement and insight intact, oriented x3.    Labs on Admission: I have personally reviewed following labs and imaging studies  CBC:  Recent Labs Lab 01/30/16 1910  WBC 14.9*  HGB 9.6*  HCT 34.2*  MCV 95.0  PLT 272   Basic Metabolic Panel:  Recent Labs Lab 01/30/16 1910  NA 138  K 3.4*  CL 85*  CO2 41*  GLUCOSE 301*  BUN 13  CREATININE 1.38*  CALCIUM 9.8   GFR: Estimated Creatinine Clearance: 48.2 mL/min (by C-G formula based on SCr of 1.38 mg/dL (H)). Liver Function Tests: No results for input(s): AST, ALT, ALKPHOS, BILITOT, PROT, ALBUMIN in the last 168 hours. No results for input(s): LIPASE, AMYLASE in the last 168 hours. No results for input(s): AMMONIA in the last 168 hours. Coagulation Profile: No results for input(s): INR, PROTIME in the last 168 hours. Cardiac Enzymes: No results for input(s): CKTOTAL, CKMB, CKMBINDEX, TROPONINI in the last 168 hours. BNP (last 3 results) No results for input(s): PROBNP in the last 8760 hours. HbA1C: No results for input(s): HGBA1C in the last 72 hours. CBG: No results for input(s): GLUCAP in the last 168 hours. Lipid Profile: No results for input(s): CHOL, HDL, LDLCALC, TRIG, CHOLHDL, LDLDIRECT in the last 72 hours. Thyroid Function Tests: No results for input(s): TSH, T4TOTAL, FREET4, T3FREE, THYROIDAB in the last 72 hours. Anemia Panel: No results for input(s): VITAMINB12, FOLATE, FERRITIN, TIBC, IRON, RETICCTPCT in the last 72 hours. Urine analysis:    Component Value Date/Time   COLORURINE YELLOW 01/06/2016 1047   APPEARANCEUR CLEAR 01/06/2016 1047   LABSPEC 1.013 01/06/2016 1047   PHURINE 5.0 01/06/2016 1047   GLUCOSEU 250 (A) 01/06/2016 1047   HGBUR NEGATIVE 01/06/2016 1047   BILIRUBINUR NEGATIVE 01/06/2016 1047   KETONESUR NEGATIVE 01/06/2016 1047   PROTEINUR NEGATIVE 01/06/2016 1047  UROBILINOGEN 0.2 02/06/2015 1906   NITRITE NEGATIVE 01/06/2016 1047   LEUKOCYTESUR  NEGATIVE 01/06/2016 1047   Sepsis Labs: @LABRCNTIP (procalcitonin:4,lacticidven:4) )No results found for this or any previous visit (from the past 240 hour(s)).   Radiological Exams on Admission: Dg Chest 2 View  Result Date: 01/30/2016 CLINICAL DATA:  Increasing dyspnea for 2 days history of COPD EXAM: CHEST  2 VIEW COMPARISON:  01/09/2016, 09/25/2015, 02/20/2015 FINDINGS: Heart size is slightly enlarged. Possible tiny pleural effusions. Linear atelectasis or infiltrate at the bilateral lung bases. Interval increase in central vascular congestion. Diffuse interstitial opacities, consistent with pulmonary edema, also increased. Right middle lobe atelectasis or infiltrate. Atherosclerosis of the aorta. Biapical scarring. Prominent right hilus unchanged. Multiple old left rib fractures. IMPRESSION: 1. Mild enlargement of the heart size with interval increase in central vascular congestion and interstitial pulmonary edema. 2. Ill-defined opacities in the lung bases could relate to atelectasis or infiltrate. Tiny bilateral effusions. 3. Mild asymmetric right hilar enlargement could relate to dilated pulmonary vessels. Radiographic follow-up is advised Electronically Signed   By: Jasmine Pang M.D.   On: 01/30/2016 19:01    EKG: Independently reviewed. EKG shows sinus tachycardia with PBCs   Assessment/Plan Active Problems:   COPD exacerbation (HCC)    1. Acute on chronic respiratory failure with hypoxia and mild hypercarbia - Possibly a combination of CHF and COPD. There is also concerns for pneumonia. Placed on BIPAP for now. Continue with lasix 40 mg and  nebs and antibiotics for possible COPD and  pneumonia. Check 2D echocardiogram, monitor intake and output, daily weights, recheck ABG in the AM.  2. Paroxysmal atrial fibrillation- Chads2vasc score is 4. Patient is on xarelto. Continue Cardizem and metoprolol.  3. DM type 2- Will be on sliding scale coverage.  4. CKD stage III. Creatinine at  baseline.  5. Chronic Iron deficiency Anemia - on iron replacement. Hemoglobin at baseline.  6. OSA. Non compliant with CPAP. Patient is currently placed on BiPAP.  7. Fall. Patient has a history of brain aneurysm with coiling. CT head pending.    DVT prophylaxis: Xarelto  Code Status: Full (this is a reversal from previous)  Family Communication: Discussed with patient  Disposition Plan: Home  Consults called: None  Admission status: Inpatient 2-3 days    Annett Gula MD [facility] Pager [phone].  If 7PM-7AM, please contact night-coverage [email or web address] Password [email or web address]  01/30/2016, 10:30 PM     By signing my name below, I, Cynda Acres, attest that this documentation has been prepared under the direction and in the presence of Midge Minium, MD. Electronically signed: Cynda Acres, Scribe. 01/30/16 10:20 PM

## 2016-01-30 NOTE — ED Notes (Signed)
Gave pt tissues, she asked for ice water

## 2016-01-30 NOTE — ED Triage Notes (Signed)
Patient is from home. C/O increasing dyspnea for 2 days. She is on home oxygen at 3 l/m. EMS called.  SaO2 was 75% on 3 L/M oxygen. Breath sounds were distant.  Albuterol neb, duo neb X 2, 2 gm MgSO4 and 125 mg Solumedrol given per EMS.  Wheezing present after meds.  Patient waas recently hospitalized for the same complaint

## 2016-01-30 NOTE — ED Provider Notes (Signed)
[facility] DEPT Provider Note   CSN: [id number] Arrival date & time: 01/30/16  1637     History   Chief Complaint Chief Complaint  Patient presents with  . Shortness of Breath    HPI [name] is a 70 y.o. female.  HPI  Pt presenting with c/o difficulty breathing.  She has hx fo COPD and is currently on 4L Delta O2 at home.  Pt states she has been having increased difficulty breathing over the past 2 days at home.  Per EMS on their arrival she had decreased breath sounds- wheezing after albuterol x 2, mag sulfate, solumedrol.  No chest pain.  No fever.  Has had some cough.  Pt was recently hospitalized for similar symptoms and intubated- admission was 3 weeks ago.  She states she has been doing well until 3 days ago.  There are no other associated systemic symptoms, there are no other alleviating or modifying factors.   Past Medical History:  Diagnosis Date  . Acute respiratory failure (HCC) 02/05/2017  . Adrenal disorder, other    "adrenal Incidentaloma"  . Anemia   . Aneurysm (HCC)    x2  . Arthritis   . Asthma   . Atrial fibrillation (HCC)   . 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 Bronchitis    history of  . Cerebral aneurysm   . Complication of anesthesia    Has woken up during surgery before  . COPD (chronic obstructive pulmonary disease) (HCC)    Dr. [name], uses 2.5L of o2 as needed  . Depression    due to loss of spouse 6 years ago  . Diabetes mellitus    metformin  . Emphysema of lung (HCC)    requested anethesia consult   . GERD (gastroesophageal reflux disease)    omeprazole  . Hypertension   . Obesity   . Rheumatoid arthritis(714.0)    has had for approx. 40years, sees Dr. [name]  . Shortness of breath   . Sleep apnea    does not use cpap  . Urinary tract infection    history of    Patient Active Problem List   Diagnosis Date Noted  . Acute respiratory failure (HCC) 01/30/2016  .  Chronic obstructive pulmonary disease with acute exacerbation (HCC)   . Type 2 diabetes mellitus without complication, without long-term current use of insulin (HCC)   . Chronic atrial fibrillation (HCC)   . Muscle weakness (generalized)   . Diarrhea   . Difficulty in walking, not elsewhere classified   . Acute on chronic respiratory failure with hypoxia and hypercapnia (HCC) 01/06/2016  . Elevated troponin I level   . Normochromic normocytic anemia   . Atrial fibrillation (HCC)   . Acute bronchitis with COPD (HCC) 09/25/2015  . Acute hyponatremia 09/25/2015  . Increased anion gap metabolic acidosis 09/25/2015  . Leukocytosis 09/25/2015  . Sepsis (HCC) 09/25/2015  . Hypokalemia 09/25/2015  . COPD exacerbation (HCC)   . Fracture, ribs 02/06/2015  . Acute on chronic respiratory failure with hypoxia (HCC) 02/06/2015  . Obesity   . Iron deficiency anemia   . Hypertension   . Calcification of right breast 02/28/2011  . Family history of breast cancer 02/10/2011  . Diabetes mellitus type II, uncontrolled (HCC) 04/01/2007  . MITRAL VALVE PROLAPSE 04/01/2007  . ASTHMA 04/01/2007  . COPD (chronic obstructive pulmonary disease) (HCC) 04/01/2007  . POSTMENOPAUSAL SYNDROME 04/01/2007  .  Rheumatoid arthritis (HCC) 04/01/2007  . VERTIGO 04/01/2007  . OSA (obstructive sleep apnea) 04/01/2007  . PALPITATIONS, HX OF 04/01/2007    Past Surgical History:  Procedure Laterality Date  . BRAIN SURGERY     coiling and stenting  . BREAST SURGERY  02/28/11   right partial mastectomy   . CARDIOVERSION N/A 09/29/2015   Procedure: CARDIOVERSION;  Surgeon: Yates DecampJay Ganji, MD;  [facility];  Service: Cardiovascular;  Laterality: N/A;  . HAND SURGERY     bilateral  . HAND SURGERY     x 2  . IR GENERIC HISTORICAL  10/01/2015   IR 3D INDEPENDENT WKST 10/01/2015 Julieanne CottonSanjeev Deveshwar, MD [facility]  . MASTECTOMY, PARTIAL  02/28/2011   Procedure: MASTECTOMY PARTIAL;  Surgeon: Ernestene MentionHaywood M Ingram, MD;  [facility]  [facility];  Service: General;  Laterality: Right;  RIGHT PARTIAL MASTECTOMY  WITH NEEDLE LOCALIZATION  . TUBAL LIGATION      OB History    No data available       Home Medications    Prior to Admission medications   Medication Sig Start Date End Date Taking? Authorizing Provider  acetaminophen (TYLENOL) 325 MG tablet Take 1 tablet (325 mg total) by mouth every 4 (four) hours as needed for mild pain, moderate pain, fever or headache. 09/28/15  Yes Starleen Armsawood S Elgergawy, MD  albuterol (PROAIR HFA) 108 (90 BASE) MCG/ACT inhaler Inhale 2 puffs into the lungs every 6 (six) hours as needed. For shortness of breath   Yes Historical Provider, MD  budesonide-formoterol (SYMBICORT) 80-4.5 MCG/ACT inhaler Inhale 2 puffs into the lungs 2 (two) times daily.   Yes Historical Provider, MD  Calcium 600-200 MG-UNIT tablet Take 1 tablet by mouth daily.   Yes Historical Provider, MD  diltiazem (CARDIZEM SR) 120 MG 12 hr capsule Take 1 capsule (120 mg total) by mouth every 12 (twelve) hours. 01/14/16  Yes Lenox PondsEdwin Silva Zapata, MD  ferrous sulfate 325 (65 FE) MG tablet Take 325 mg by mouth daily with breakfast.   Yes Historical Provider, MD  furosemide (LASIX) 20 MG tablet Take 20 mg by mouth daily.   Yes Historical Provider, MD  HYDROcodone-acetaminophen (NORCO/VICODIN) 5-325 MG tablet Take 1 tablet by mouth every 6 (six) hours as needed for moderate pain.   Yes Historical Provider, MD  lactobacillus acidophilus (BACID) TABS tablet Take 2 tablets by mouth 3 (three) times daily. Patient taking differently: Take 1 tablet by mouth daily.  01/14/16  Yes Lenox PondsEdwin Silva Zapata, MD  metFORMIN (GLUCOPHAGE) 500 MG tablet Take 2 tablets by mouth 2 (two) times daily with a meal.   Yes Historical Provider, MD  metoprolol tartrate (LOPRESSOR) 25 MG tablet Take 1 tablet (25 mg total) by mouth 2 (two) times daily. 01/14/16  Yes Lenox PondsEdwin Silva Zapata, MD  Multiple Vitamins-Minerals (CENTRUM SILVER ADULT 50+ PO) Take 1 tablet by mouth daily.    Yes Historical Provider, MD  omeprazole (PRILOSEC) 20 MG capsule Take 20 mg by mouth daily.   Yes Historical Provider, MD  OXYGEN Inhale 4 L into the lungs continuous.    Yes Historical Provider, MD  predniSONE (DELTASONE) 10 MG tablet Please take   20 mg daily for 2 days, then 10 mg daily until seen by PMD. Patient taking differently: Take 10 mg by mouth daily.  01/14/16  Yes Lenox PondsEdwin Silva Zapata, MD  rivaroxaban (XARELTO) 20 MG TABS tablet Take 1 tablet (20 mg total) by mouth daily with supper. 01/14/16  Yes Lenox PondsEdwin Silva Zapata, MD  spironolactone-hydrochlorothiazide [facility](ALDACTAZIDE)  25-25 MG tablet Take 1 tablet by mouth daily.   Yes Historical Provider, MD  tiotropium (SPIRIVA) 18 MCG inhalation capsule Place 18 mcg into inhaler and inhale daily.    Yes Historical Provider, MD  traZODone (DESYREL) 50 MG tablet Take 50 mg by mouth at bedtime.   Yes Historical Provider, MD  LORazepam (ATIVAN) 0.5 MG tablet Take 1 tablet (0.5 mg total) by mouth every 8 (eight) hours as needed for anxiety. Patient not taking: Reported on 01/22/2016 09/28/15   Leana Roe Elgergawy, MD  mometasone-formoterol (DULERA) 100-5 MCG/ACT AERO Inhale 2 puffs into the lungs 2 (two) times daily. Patient not taking: Reported on 01/22/2016 01/14/16   Lenox Ponds, MD    Family History Family History  Problem Relation Age of Onset  . Heart disease Mother   . Cancer Father     lung  . Cancer Sister     breast    Social History Social History  Substance Use Topics  . Smoking status: Former Smoker    Packs/day: 1.50    Years: 40.00    Types: Cigarettes  . Smokeless tobacco: Never Used     Comment: quit 2006  . Alcohol use 0.6 oz/week    1 Shots of liquor per week     Allergies   Gold-containing drug products and Tape   Review of Systems Review of Systems  ROS reviewed and all otherwise negative except for mentioned in HPI   Physical Exam Updated Vital Signs BP (!) 155/67 (BP Location: Left Arm)   Pulse 92    Temp 98.9 F (37.2 C) (Oral)   Resp (!) 26   Ht 5\' 7"  (1.702 m)   Wt 107.1 kg   SpO2 92%   BMI 36.98 kg/m  Vitals reviewed Physical Exam Physical Examination: General appearance - alert, well appearing, and in no distress Mental status - alert, oriented to person, place, and time Eyes - no conjunctival injection no scleral icterus Mouth - mucous membranes moist, pharynx normal without lesions Chest - BSS, diffuse wheezing bilaterally, pt speaking in full sentences Heart - normal rate, regular rhythm, normal S1, S2, no murmurs, rubs, clicks or gallops Abdomen - soft, nontender, nondistended, no masses or organomegaly Neurological - alert, oriented, normal speech Extremities - peripheral pulses normal, no pedal edema, no clubbing or cyanosis Skin - normal coloration and turgor, no rashes  ED Treatments / Results  Labs (all labs ordered are listed, but only abnormal results are displayed) Labs Reviewed  BLOOD CULTURE ID PANEL (REFLEXED) - Abnormal; Notable for the following:       Result Value   Staphylococcus species DETECTED (*)    Methicillin resistance DETECTED (*)    All other components within normal limits  CBC - Abnormal; Notable for the following:    WBC 14.9 (*)    RBC 3.60 (*)    Hemoglobin 9.6 (*)    HCT 34.2 (*)    MCHC 28.1 (*)    RDW 16.9 (*)    All other components within normal limits  BASIC METABOLIC PANEL - Abnormal; Notable for the following:    Potassium 3.4 (*)    Chloride 85 (*)    CO2 41 (*)    Glucose, Bld 301 (*)    Creatinine, Ser 1.38 (*)    GFR calc non Af Amer 38 (*)    GFR calc Af Amer 44 (*)    All other components within normal limits  TROPONIN I - Abnormal; Notable for the  following:    Troponin I 0.03 (*)    All other components within normal limits  TROPONIN I - Abnormal; Notable for the following:    Troponin I 0.03 (*)    All other components within normal limits  BASIC METABOLIC PANEL - Abnormal; Notable for the following:     Chloride 84 (*)    CO2 41 (*)    Glucose, Bld 349 (*)    Creatinine, Ser 1.40 (*)    GFR calc non Af Amer 37 (*)    GFR calc Af Amer 43 (*)    All other components within normal limits  CBC - Abnormal; Notable for the following:    RBC 3.40 (*)    Hemoglobin 9.1 (*)    HCT 30.9 (*)    MCHC 29.4 (*)    RDW 17.0 (*)    All other components within normal limits  GLUCOSE, CAPILLARY - Abnormal; Notable for the following:    Glucose-Capillary 297 (*)    All other components within normal limits  GLUCOSE, CAPILLARY - Abnormal; Notable for the following:    Glucose-Capillary 228 (*)    All other components within normal limits  BRAIN NATRIURETIC PEPTIDE - Abnormal; Notable for the following:    B Natriuretic Peptide 193.7 (*)    All other components within normal limits  GLUCOSE, CAPILLARY - Abnormal; Notable for the following:    Glucose-Capillary 197 (*)    All other components within normal limits  CBC WITH DIFFERENTIAL/PLATELET - Abnormal; Notable for the following:    WBC 16.8 (*)    RBC 3.16 (*)    Hemoglobin 8.5 (*)    HCT 28.4 (*)    MCHC 29.9 (*)    RDW 18.0 (*)    Neutro Abs 13.7 (*)    Monocytes Absolute 1.5 (*)    All other components within normal limits  COMPREHENSIVE METABOLIC PANEL - Abnormal; Notable for the following:    Sodium 134 (*)    Chloride 81 (*)    CO2 46 (*)    Glucose, Bld 190 (*)    Creatinine, Ser 1.56 (*)    Calcium 8.5 (*)    Total Protein 5.6 (*)    Albumin 2.8 (*)    ALT 13 (*)    Alkaline Phosphatase 36 (*)    GFR calc non Af Amer 33 (*)    GFR calc Af Amer 38 (*)    All other components within normal limits  MAGNESIUM - Abnormal; Notable for the following:    Magnesium 1.6 (*)    All other components within normal limits  BRAIN NATRIURETIC PEPTIDE - Abnormal; Notable for the following:    B Natriuretic Peptide 466.8 (*)    All other components within normal limits  GLUCOSE, CAPILLARY - Abnormal; Notable for the following:     Glucose-Capillary 150 (*)    All other components within normal limits  GLUCOSE, CAPILLARY - Abnormal; Notable for the following:    Glucose-Capillary 197 (*)    All other components within normal limits  GLUCOSE, CAPILLARY - Abnormal; Notable for the following:    Glucose-Capillary 183 (*)    All other components within normal limits  GLUCOSE, CAPILLARY - Abnormal; Notable for the following:    Glucose-Capillary 204 (*)    All other components within normal limits  BASIC METABOLIC PANEL - Abnormal; Notable for the following:    Potassium 3.4 (*)    Chloride 79 (*)    CO2 49 (*)  Glucose, Bld 142 (*)    Creatinine, Ser 1.36 (*)    Calcium 8.8 (*)    GFR calc non Af Amer 38 (*)    GFR calc Af Amer 45 (*)    All other components within normal limits  BRAIN NATRIURETIC PEPTIDE - Abnormal; Notable for the following:    B Natriuretic Peptide 246.9 (*)    All other components within normal limits  CBC WITH DIFFERENTIAL/PLATELET - Abnormal; Notable for the following:    WBC 12.2 (*)    RBC 3.73 (*)    Hemoglobin 10.0 (*)    HCT 34.4 (*)    MCHC 29.1 (*)    RDW 17.9 (*)    Neutro Abs 8.3 (*)    All other components within normal limits  MAGNESIUM - Abnormal; Notable for the following:    Magnesium 1.5 (*)    All other components within normal limits  GLUCOSE, CAPILLARY - Abnormal; Notable for the following:    Glucose-Capillary 223 (*)    All other components within normal limits  GLUCOSE, CAPILLARY - Abnormal; Notable for the following:    Glucose-Capillary 170 (*)    All other components within normal limits  GLUCOSE, CAPILLARY - Abnormal; Notable for the following:    Glucose-Capillary 155 (*)    All other components within normal limits  I-STAT ARTERIAL BLOOD GAS, ED - Abnormal; Notable for the following:    pH, Arterial 7.337 (*)    pCO2 arterial 81.5 (*)    pO2, Arterial 55.0 (*)    Bicarbonate 43.5 (*)    Acid-Base Excess 14.0 (*)    All other components within  normal limits  CBG MONITORING, ED - Abnormal; Notable for the following:    Glucose-Capillary 393 (*)    All other components within normal limits  POCT I-STAT 3, ART BLOOD GAS (G3+) - Abnormal; Notable for the following:    pH, Arterial 7.512 (*)    pCO2 arterial 57.8 (*)    pO2, Arterial 58.0 (*)    Bicarbonate 46.4 (*)    Acid-Base Excess 21.0 (*)    All other components within normal limits  MRSA PCR SCREENING  CULTURE, BLOOD (ROUTINE X 2)  CULTURE, BLOOD (ROUTINE X 2)  CULTURE, EXPECTORATED SPUTUM-ASSESSMENT  BRAIN NATRIURETIC PEPTIDE  TROPONIN I  MAGNESIUM  TSH  PHOSPHORUS  PHOSPHORUS  BASIC METABOLIC PANEL  BRAIN NATRIURETIC PEPTIDE  I-STAT TROPOININ, ED    EKG  EKG Interpretation  Date/Time:  Thursday January 31 2016 03:04:02 EDT Ventricular Rate:  146 PR Interval:    QRS Duration: 79 QT Interval:  288 QTC Calculation: 449 R Axis:   25 Text Interpretation:  Atrial fibrillation with rapid V-rate Abnormal R-wave progression, early transition Minimal ST depression, inferior leads Borderline T wave abnormalities Abnormal ekg Confirmed by Bebe Shaggy  MD, DONALD ([id number]) on 01/31/2016 3:06:26 AM       Radiology Dg Chest 2 View  Result Date: 02/01/2016 CLINICAL DATA:  70 year old female with history of congestive heart failure. EXAM: CHEST  2 VIEW COMPARISON:  Chest x-ray 01/30/2016. FINDINGS: The status changes are noted throughout the lungs bilaterally. There is cephalization of the pulmonary vasculature and slight indistinctness of the interstitial markings suggestive of mild pulmonary edema. Small bilateral pleural effusions. Bibasilar subsegmental atelectasis. Heart size is mildly enlarged. Upper mediastinal contours are distorted by patient's rotation the left. Atherosclerosis in the thoracic aorta. IMPRESSION: 1. The appearance of the chest suggests mild congestive heart failure. 2. Bibasilar subsegmental atelectasis. 3. Emphysema. 4.  Aortic atherosclerosis.  Electronically Signed   By: Trudie Reed M.D.   On: 02/01/2016 07:01    Procedures Procedures (including critical care time)  Medications Ordered in ED Medications  vancomycin (VANCOCIN) 1,250 mg in sodium chloride 0.9 % 250 mL IVPB (1,250 mg Intravenous Given 02/01/16 2116)  ceFEPIme (MAXIPIME) 2 g in dextrose 5 % 50 mL IVPB (2 g Intravenous Given 02/02/16 0030)  mometasone-formoterol (DULERA) 100-5 MCG/ACT inhaler 2 puff (2 puffs Inhalation Not Given 02/02/16 0743)  pantoprazole (PROTONIX) EC tablet 40 mg (40 mg Oral Given 02/02/16 1100)  HYDROcodone-acetaminophen (NORCO/VICODIN) 5-325 MG per tablet 1 tablet (1 tablet Oral Given 02/02/16 1553)  lactobacillus acidophilus (BACID) tablet 1 tablet (1 tablet Oral Given 02/02/16 0953)  metoprolol tartrate (LOPRESSOR) tablet 25 mg (25 mg Oral Given 02/02/16 0952)  rivaroxaban (XARELTO) tablet 20 mg (20 mg Oral Given 02/02/16 1552)  ferrous sulfate tablet 325 mg (325 mg Oral Given 02/02/16 0955)  ipratropium-albuterol (DUONEB) 0.5-2.5 (3) MG/3ML nebulizer solution 3 mL (not administered)  acetaminophen (TYLENOL) tablet 650 mg (not administered)    Or  acetaminophen (TYLENOL) suppository 650 mg (not administered)  ondansetron (ZOFRAN) tablet 4 mg (not administered)    Or  ondansetron (ZOFRAN) injection 4 mg (not administered)  insulin aspart (novoLOG) injection 0-9 Units (2 Units Subcutaneous Given 02/02/16 1720)  sodium chloride flush (NS) 0.9 % injection 3 mL (3 mLs Intravenous Given 02/02/16 0900)  potassium chloride SA (K-DUR,KLOR-CON) CR tablet 20 mEq (0 mEq Oral Hold 01/31/16 0323)  spironolactone (ALDACTONE) tablet 25 mg (25 mg Oral Given 02/02/16 0953)  LORazepam (ATIVAN) tablet 0.5 mg (0.5 mg Oral Given 02/02/16 0034)  diltiazem (CARDIZEM CD) 24 hr capsule 240 mg (240 mg Oral Given 02/02/16 1430)  metoprolol (LOPRESSOR) injection 5 mg (5 mg Intravenous Given 02/02/16 0621)  traZODone (DESYREL) tablet 50 mg (50 mg Oral Given  02/01/16 2248)  ipratropium-albuterol (DUONEB) 0.5-2.5 (3) MG/3ML nebulizer solution 3 mL (3 mLs Nebulization Given 02/02/16 1629)  magnesium oxide (MAG-OX) tablet 400 mg (400 mg Oral Given 02/02/16 1551)  sodium chloride HYPERTONIC 3 % nebulizer solution 4 mL (4 mLs Nebulization Given 02/02/16 1627)  furosemide (LASIX) tablet 20 mg (not administered)  albuterol (PROVENTIL,VENTOLIN) solution continuous neb (10 mg/hr Nebulization Given 01/30/16 1734)  furosemide (LASIX) injection 40 mg (40 mg Intravenous Given 01/30/16 2054)  ceFEPIme (MAXIPIME) 2 g in dextrose 5 % 50 mL IVPB (0 g Intravenous Stopped 01/30/16 2129)  vancomycin (VANCOCIN) 2,000 mg in sodium chloride 0.9 % 500 mL IVPB (0 mg Intravenous Stopped 01/31/16 0157)  metoprolol (LOPRESSOR) injection 2.5 mg (0 mg Intravenous Duplicate 01/31/16 1200)  potassium chloride SA (K-DUR,KLOR-CON) CR tablet 40 mEq (40 mEq Oral Given 02/02/16 1552)  magnesium sulfate IVPB 1 g 100 mL (1 g Intravenous Given 02/02/16 1115)     Initial Impression / Assessment and Plan / ED Course  I have reviewed the triage vital signs and the nursing notes.  Pertinent labs & imaging results that were available during my care of the patient were reviewed by me and considered in my medical decision making (see chart for details).  Clinical Course  CRITICAL CARE Performed by: Ethelda Chick Total critical care time: 40  minutes Critical care time was exclusive of separately billable procedures and treating other patients. Critical care was necessary to treat or prevent imminent or life-threatening deterioration. Critical care was time spent personally by me on the following activities: development of treatment plan with patient and/or surrogate as well as nursing, discussions with  consultants, evaluation of patient's response to treatment, examination of patient, obtaining history from patient or surrogate, ordering and performing treatments and interventions,  ordering and review of laboratory studies, ordering and review of radiographic studies, pulse oximetry and re-evaluation of patient's condition.  Pt presenting with shrotness of breath and hypoxia.  She is feeling improved on continous albuterol, has received steroids, mag sulfate as well. cxr with findings c/w CHF- given IV lasix- also started on broad spectrum abx to cover for HCAP.   Pt to be admitted to step down bed.    D/w hospitalist service.    Final Clinical Impressions(s) / ED Diagnoses   Final diagnoses:  COPD exacerbation (HCC)  CHF (congestive heart failure) [facility])    New Prescriptions Current Discharge Medication List       Jerelyn Scott, MD 02/02/16 [phone]

## 2016-01-31 ENCOUNTER — Inpatient Hospital Stay (HOSPITAL_COMMUNITY): Payer: Medicare Other

## 2016-01-31 DIAGNOSIS — E118 Type 2 diabetes mellitus with unspecified complications: Secondary | ICD-10-CM

## 2016-01-31 DIAGNOSIS — E1165 Type 2 diabetes mellitus with hyperglycemia: Secondary | ICD-10-CM

## 2016-01-31 DIAGNOSIS — Z0389 Encounter for observation for other suspected diseases and conditions ruled out: Secondary | ICD-10-CM

## 2016-01-31 LAB — POCT I-STAT 3, ART BLOOD GAS (G3+)
Acid-Base Excess: 21 mmol/L — ABNORMAL HIGH (ref 0.0–2.0)
BICARBONATE: 46.4 mmol/L — AB (ref 20.0–28.0)
O2 Saturation: 91 %
PCO2 ART: 57.8 mmHg — AB (ref 32.0–48.0)
Patient temperature: 98.3
TCO2: 48 mmol/L (ref 0–100)
pH, Arterial: 7.512 — ABNORMAL HIGH (ref 7.350–7.450)
pO2, Arterial: 58 mmHg — ABNORMAL LOW (ref 83.0–108.0)

## 2016-01-31 LAB — ECHOCARDIOGRAM COMPLETE
Ao-asc: 28 cm
CHL CUP DOP CALC LVOT VTI: 20.3 cm
CHL CUP TV REG PEAK VELOCITY: 230 cm/s
E decel time: 250 msec
E/e' ratio: 8.79
FS: 33 % (ref 28–44)
Height: 67 in
IVS/LV PW RATIO, ED: 1.12
LA diam index: 1.34 cm/m2
LA vol A4C: 44.7 ml
LA vol index: 24.8 mL/m2
LA vol: 57.5 mL
LASIZE: 31 mm
LDCA: 3.14 cm2
LEFT ATRIUM END SYS DIAM: 31 mm
LV TDI E'LATERAL: 13.2
LV TDI E'MEDIAL: 11.1
LVEEAVG: 8.79
LVEEMED: 8.79
LVELAT: 13.2 cm/s
LVOT peak grad rest: 8 mmHg
LVOTD: 20 mm
LVOTPV: 140 cm/s
LVOTSV: 64 mL
MV Dec: 250
MV pk E vel: 116 m/s
MVPG: 5 mmHg
PW: 11.6 mm — AB (ref 0.6–1.1)
RV TAPSE: 20.9 mm
RV sys press: 29 mmHg
TR max vel: 230 cm/s
Weight: 3837.77 oz

## 2016-01-31 LAB — GLUCOSE, CAPILLARY
GLUCOSE-CAPILLARY: 297 mg/dL — AB (ref 65–99)
GLUCOSE-CAPILLARY: 197 mg/dL — AB (ref 65–99)
Glucose-Capillary: 228 mg/dL — ABNORMAL HIGH (ref 65–99)

## 2016-01-31 LAB — MAGNESIUM: Magnesium: 1.9 mg/dL (ref 1.7–2.4)

## 2016-01-31 LAB — CBC
HEMATOCRIT: 30.9 % — AB (ref 36.0–46.0)
HEMOGLOBIN: 9.1 g/dL — AB (ref 12.0–15.0)
MCH: 26.8 pg (ref 26.0–34.0)
MCHC: 29.4 g/dL — ABNORMAL LOW (ref 30.0–36.0)
MCV: 90.9 fL (ref 78.0–100.0)
Platelets: 244 10*3/uL (ref 150–400)
RBC: 3.4 MIL/uL — ABNORMAL LOW (ref 3.87–5.11)
RDW: 17 % — ABNORMAL HIGH (ref 11.5–15.5)
WBC: 9.7 10*3/uL (ref 4.0–10.5)

## 2016-01-31 LAB — BASIC METABOLIC PANEL
ANION GAP: 10 (ref 5–15)
BUN: 18 mg/dL (ref 6–20)
CHLORIDE: 84 mmol/L — AB (ref 101–111)
CO2: 41 mmol/L — ABNORMAL HIGH (ref 22–32)
Calcium: 9 mg/dL (ref 8.9–10.3)
Creatinine, Ser: 1.4 mg/dL — ABNORMAL HIGH (ref 0.44–1.00)
GFR calc Af Amer: 43 mL/min — ABNORMAL LOW (ref >60)
GFR, EST NON AFRICAN AMERICAN: 37 mL/min — AB (ref >60)
GLUCOSE: 349 mg/dL — AB (ref 65–99)
POTASSIUM: 3.7 mmol/L (ref 3.5–5.1)
SODIUM: 135 mmol/L (ref 135–145)

## 2016-01-31 LAB — BRAIN NATRIURETIC PEPTIDE: B NATRIURETIC PEPTIDE 5: 193.7 pg/mL — AB (ref 0.0–100.0)

## 2016-01-31 LAB — TROPONIN I
Troponin I: 0.03 ng/mL (ref <0.03)
Troponin I: 0.03 ng/mL (ref <0.03)

## 2016-01-31 LAB — MRSA PCR SCREENING: MRSA BY PCR: NEGATIVE

## 2016-01-31 LAB — TSH: TSH: 1.99 u[IU]/mL (ref 0.350–4.500)

## 2016-01-31 LAB — CBG MONITORING, ED: GLUCOSE-CAPILLARY: 393 mg/dL — AB (ref 65–99)

## 2016-01-31 MED ORDER — DILTIAZEM HCL ER COATED BEADS 120 MG PO CP24
240.0000 mg | ORAL_CAPSULE | Freq: Every day | ORAL | Status: DC
  Administered 2016-01-31 – 2016-02-08 (×9): 240 mg via ORAL
  Filled 2016-01-31 (×7): qty 2
  Filled 2016-01-31 (×2): qty 1

## 2016-01-31 MED ORDER — METOPROLOL TARTRATE 5 MG/5ML IV SOLN
2.5000 mg | Freq: Once | INTRAVENOUS | Status: AC
  Administered 2016-01-31: 2.5 mg via INTRAVENOUS

## 2016-01-31 MED ORDER — METOPROLOL TARTRATE 5 MG/5ML IV SOLN
INTRAVENOUS | Status: AC
  Filled 2016-01-31: qty 5

## 2016-01-31 MED ORDER — HYDROCHLOROTHIAZIDE 25 MG PO TABS
25.0000 mg | ORAL_TABLET | Freq: Every day | ORAL | Status: DC
  Administered 2016-01-31: 25 mg via ORAL
  Filled 2016-01-31: qty 1

## 2016-01-31 MED ORDER — DILTIAZEM HCL-DEXTROSE 100-5 MG/100ML-% IV SOLN (PREMIX)
5.0000 mg/h | INTRAVENOUS | Status: DC
  Administered 2016-01-31: 10 mg/h via INTRAVENOUS
  Administered 2016-01-31: 5 mg/h via INTRAVENOUS
  Administered 2016-01-31: 15 mg/h via INTRAVENOUS
  Filled 2016-01-31 (×4): qty 100

## 2016-01-31 MED ORDER — LORAZEPAM 0.5 MG PO TABS
0.5000 mg | ORAL_TABLET | Freq: Three times a day (TID) | ORAL | Status: DC | PRN
  Administered 2016-01-31 – 2016-02-06 (×8): 0.5 mg via ORAL
  Filled 2016-01-31 (×9): qty 1

## 2016-01-31 MED ORDER — DILTIAZEM HCL 100 MG IV SOLR: 5.0000 mg/h | Freq: Once | INTRAVENOUS | Status: DC

## 2016-01-31 MED ORDER — METOPROLOL TARTRATE 5 MG/5ML IV SOLN
5.0000 mg | INTRAVENOUS | Status: DC | PRN
  Administered 2016-01-31 – 2016-02-04 (×3): 5 mg via INTRAVENOUS
  Filled 2016-01-31 (×4): qty 5

## 2016-01-31 MED ORDER — TRAZODONE HCL 50 MG PO TABS
50.0000 mg | ORAL_TABLET | Freq: Every day | ORAL | Status: DC
  Administered 2016-01-31 – 2016-02-04 (×5): 50 mg via ORAL
  Filled 2016-01-31 (×5): qty 1

## 2016-01-31 MED ORDER — SPIRONOLACTONE 25 MG PO TABS
25.0000 mg | ORAL_TABLET | Freq: Every day | ORAL | Status: DC
  Administered 2016-01-31 – 2016-02-08 (×9): 25 mg via ORAL
  Filled 2016-01-31 (×9): qty 1

## 2016-01-31 NOTE — Consult Note (Signed)
CARDIOLOGY CONSULT NOTE  Patient ID: [name] MRN: [id number] DOB/AGE: [birth year] 70 y.o.  Admit date: 01/30/2016 Referring Physician  Lesia Sago Primary Physician:  [name], MD Reason for Consultation  A. Fib  HPI: [name]  is a 70 y.o. female  With Patient with severe COPD, uncontrolled diabetes mellitus, chronic anemia, history of cerebral aneurysm status post aneurysm coiling in 2009, multiple admissions to the hospital recently with acute COPD exacerbations, now admitted back to the hospital with worsening dyspnea after she fell in the house.  She was found to be in atrial fibrillation with rapid ventricular response and have been requested to see the patient for management of the same.  Except for chronic dyspnea which has recently gotten worse, no other specific complaints.  She denies any chest pain or palpitations.  She feels generally poor.  She continues to have significant orthopnea and sleeps in a recliner at home.  No fever, no chills.  Past Medical History:  Diagnosis Date  . Acute respiratory failure (HCC) 02/05/2017  . Adrenal disorder, other    "adrenal Incidentaloma"  . Anemia   . Aneurysm (HCC)    x2  . Arthritis   . Asthma   . Atrial fibrillation (HCC)   . 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 Bronchitis    history of  . Cerebral aneurysm   . Complication of anesthesia    Has woken up during surgery before  . COPD (chronic obstructive pulmonary disease) (HCC)    Dr. [name], uses 2.5L of o2 as needed  . Depression    due to loss of spouse 6 years ago  . Diabetes mellitus    metformin  . Emphysema of lung (HCC)    requested anethesia consult   . GERD (gastroesophageal reflux disease)    omeprazole  . Hypertension   . Obesity   . Rheumatoid arthritis(714.0)    has had for approx. 40years, sees Dr. [name]  . Shortness of breath   . Sleep apnea    does not use cpap  .  Urinary tract infection    history of     Past Surgical History:  Procedure Laterality Date  . BRAIN SURGERY     coiling and stenting  . BREAST SURGERY  02/28/11   right partial mastectomy   . CARDIOVERSION N/A 09/29/2015   Procedure: CARDIOVERSION;  Surgeon: [name], MD;  Location: [facility] OR;  Service: Cardiovascular;  Laterality: N/A;  . HAND SURGERY     bilateral  . HAND SURGERY     x 2  . IR GENERIC HISTORICAL  10/01/2015   IR 3D INDEPENDENT WKST 10/01/2015 Julieanne Cotton, MD [facility]  . MASTECTOMY, PARTIAL  02/28/2011   Procedure: MASTECTOMY PARTIAL;  Surgeon: Ernestene Mention, MD;  Location: [facility] OR;  Service: General;  Laterality: Right;  RIGHT PARTIAL MASTECTOMY  WITH NEEDLE LOCALIZATION  . TUBAL LIGATION       Family History  Problem Relation Age of Onset  . Heart disease Mother   . Cancer Father     lung  . Cancer Sister     breast     Social History: Social History   Social History  . Marital status: Widowed    Spouse name: N/A  . Number of children: N/A  . Years of education: N/A   Occupational History  . Not on file.   Social History  Main Topics  . Smoking status: Former Smoker    Packs/day: 1.50    Years: 40.00    Types: Cigarettes  . Smokeless tobacco: Never Used     Comment: quit 2006  . Alcohol use 0.6 oz/week    1 Shots of liquor per week  . Drug use: No  . Sexual activity: Not on file   Other Topics Concern  . Not on file   Social History Narrative  . No narrative on file     Prescriptions Prior to Admission  Medication Sig Dispense Refill Last Dose  . acetaminophen (TYLENOL) 325 MG tablet Take 1 tablet (325 mg total) by mouth every 4 (four) hours as needed for mild pain, moderate pain, fever or headache.   unk  . albuterol (PROAIR HFA) 108 (90 BASE) MCG/ACT inhaler Inhale 2 puffs into the lungs every 6 (six) hours as needed. For shortness of breath   unk  . budesonide-formoterol (SYMBICORT) 80-4.5 MCG/ACT inhaler Inhale 2 puffs  into the lungs 2 (two) times daily.   01/29/2016 at Unknown time  . Calcium 600-200 MG-UNIT tablet Take 1 tablet by mouth daily.   01/29/2016 at Unknown time  . diltiazem (CARDIZEM SR) 120 MG 12 hr capsule Take 1 capsule (120 mg total) by mouth every 12 (twelve) hours. 60 capsule 0 01/29/2016 at Unknown time  . ferrous sulfate 325 (65 FE) MG tablet Take 325 mg by mouth daily with breakfast.   01/29/2016 at Unknown time  . furosemide (LASIX) 20 MG tablet Take 20 mg by mouth daily.   01/29/2016 at Unknown time  . HYDROcodone-acetaminophen (NORCO/VICODIN) 5-325 MG tablet Take 1 tablet by mouth every 6 (six) hours as needed for moderate pain.   unk  . lactobacillus acidophilus (BACID) TABS tablet Take 2 tablets by mouth 3 (three) times daily. (Patient taking differently: Take 1 tablet by mouth daily. ) 30 tablet 0 01/29/2016 at Unknown time  . metFORMIN (GLUCOPHAGE) 500 MG tablet Take 2 tablets by mouth 2 (two) times daily with a meal.   01/29/2016 at Unknown time  . metoprolol tartrate (LOPRESSOR) 25 MG tablet Take 1 tablet (25 mg total) by mouth 2 (two) times daily. 30 tablet 0 01/29/2016 at 1930  . Multiple Vitamins-Minerals (CENTRUM SILVER ADULT 50+ PO) Take 1 tablet by mouth daily.   01/29/2016 at Unknown time  . omeprazole (PRILOSEC) 20 MG capsule Take 20 mg by mouth daily.   01/29/2016 at Unknown time  . OXYGEN Inhale 4 L into the lungs continuous.    01/30/2016 at Unknown time  . predniSONE (DELTASONE) 10 MG tablet Please take   20 mg daily for 2 days, then 10 mg daily until seen by PMD. (Patient taking differently: Take 10 mg by mouth daily. )   01/29/2016 at Unknown time  . rivaroxaban (XARELTO) 20 MG TABS tablet Take 1 tablet (20 mg total) by mouth daily with supper. 30 tablet 0 01/29/2016 at Unknown time  . spironolactone-hydrochlorothiazide (ALDACTAZIDE) 25-25 MG tablet Take 1 tablet by mouth daily.   01/29/2016 at Unknown time  . tiotropium (SPIRIVA) 18 MCG inhalation capsule Place 18 mcg  into inhaler and inhale daily.    01/29/2016 at Unknown time  . traZODone (DESYREL) 50 MG tablet Take 50 mg by mouth at bedtime.   01/29/2016 at Unknown time  . LORazepam (ATIVAN) 0.5 MG tablet Take 1 tablet (0.5 mg total) by mouth every 8 (eight) hours as needed for anxiety. (Patient not taking: Reported on 01/22/2016) 15 tablet 0  Not Taking  . mometasone-formoterol (DULERA) 100-5 MCG/ACT AERO Inhale 2 puffs into the lungs 2 (two) times daily. (Patient not taking: Reported on 01/22/2016) 1 Inhaler 0 Not Taking     ROS: General: no fevers/chills/night sweats Eyes: no blurry vision, diplopia, or amaurosis ENT: no sore throat or hearing loss Resp: no cough, wheezing, or hemoptysis CV: no edema or palpitations GI: no abdominal pain, nausea, vomiting, diarrhea, or constipation GU: no dysuria, frequency, or hematuria Skin: no rash Neuro: no headache, numbness, tingling, or weakness of extremities Musculoskeletal: no joint pain or swelling Heme: no bleeding, DVT, or easy bruising Endo: no polydipsia or polyuria    Physical Exam: Blood pressure 133/89, pulse (!) 142, temperature 98.3 F (36.8 C), temperature source Axillary, resp. rate 16, height 5\' 7"  (1.702 m), weight 108.8 kg (239 lb 13.8 oz), SpO2 99 %.    Physical Exam  Constitutional: She is oriented to person, place, and time. She appears well-developed.  Neck: Normal range of motion.  Short neck  Cardiovascular:  No murmur heard. S1 is variable, S2 is normal. Tachycardia present.  Pulmonary/Chest: No respiratory distress. She has wheezes. She has rales.  Abdominal: Soft. Bowel sounds are normal.  Musculoskeletal: Normal range of motion.  Neurological: She is alert and oriented to person, place, and time.  Skin: Skin is warm.  Pulses: Normal pulses bilateral  Labs:   Lab Results  Component Value Date   WBC 9.7 01/31/2016   HGB 9.1 (L) 01/31/2016   HCT 30.9 (L) 01/31/2016   MCV 90.9 01/31/2016   PLT 244 01/31/2016     Recent Labs Lab 01/31/16 0448  NA 135  K 3.7  CL 84*  CO2 41*  BUN 18  CREATININE 1.40*  CALCIUM 9.0  GLUCOSE 349*    Lipid Panel     Component Value Date/Time   TRIG 76 01/06/2016 0538    BNP (last 3 results)  Recent Labs  09/25/15 0435 01/06/16 0538 01/30/16 1921  BNP 95.4 192.3* 80.1    HEMOGLOBIN A1C Lab Results  Component Value Date   HGBA1C 7.5 (H) 01/06/2016   MPG 169 01/06/2016    Cardiac Panel (last 3 results)  Recent Labs  09/25/15 0747  01/31/16 0140 01/31/16 0448 01/31/16 1103  CKTOTAL 168  --   --   --   --   TROPONINI  --   < > 0.03* 0.03* <0.03  < > = values in this interval not displayed.  Lab Results  Component Value Date   CKTOTAL 168 09/25/2015   TROPONINI <0.03 01/31/2016     TSH  Recent Labs  10/01/15 1331 01/31/16 0140  TSH 0.613 1.990    EKG: Atrial fibrillation with RVR. No evidence of ischemia. This is in spite of RVR of 1 46 bpm.  Echo: Hyperdynamic LV, no significant valvular abnormality, poor echo window. Right ventricle appears mildly dilated. IVC is dilated with reduced respiratory response suggests elevated right heart pressure.   Radiology: Dg Chest 2 View  Result Date: 01/30/2016 CLINICAL DATA:  Increasing dyspnea for 2 days history of COPD EXAM: CHEST  2 VIEW COMPARISON:  01/09/2016, 09/25/2015, 02/20/2015 FINDINGS: Heart size is slightly enlarged. Possible tiny pleural effusions. Linear atelectasis or infiltrate at the bilateral lung bases. Interval increase in central vascular congestion. Diffuse interstitial opacities, consistent with pulmonary edema, also increased. Right middle lobe atelectasis or infiltrate. Atherosclerosis of the aorta. Biapical scarring. Prominent right hilus unchanged. Multiple old left rib fractures. IMPRESSION: 1. Mild enlargement of the heart  size with interval increase in central vascular congestion and interstitial pulmonary edema. 2. Ill-defined opacities in the lung bases could  relate to atelectasis or infiltrate. Tiny bilateral effusions. 3. Mild asymmetric right hilar enlargement could relate to dilated pulmonary vessels. Radiographic follow-up is advised Electronically Signed   By: Jasmine PangKim  Fujinaga M.D.   On: 01/30/2016 19:01   Ct Head Wo Contrast  Result Date: 01/31/2016 CLINICAL DATA:  70 y/o  F; status post fall. EXAM: CT HEAD WITHOUT CONTRAST TECHNIQUE: Contiguous axial images were obtained from the base of the skull through the vertex without intravenous contrast. COMPARISON:  01/29/2009 MR brain. 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 Within the visualized brain there is no evidence for large acute infarct, focal mass effect, intra-axial hemorrhage, or hydrocephalus. Nonspecific foci of hypoattenuation in subcortical and periventricular white matter grossly corresponded T2 FLAIR signal abnormality on the prior MRI and are compatible with moderate chronic microvascular ischemic changes. Probable chronic lacunar infarct within the left lentiform nucleus. Vascular: As above. Skull:  No displaced calvarial fracture. Sinuses/Orbits: Paranasal sinus mucosal thickening in maxillary and sphenoid sinuses. Mastoid air cells are partially opacified on the left and normally aerated on the right. Orbits are unremarkable. Other: None. 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 Within the visualized brain no acute abnormality is identified. Grossly stable moderate chronic microvascular ischemic changes given differences in technique. Electronically Signed   By: Mitzi HansenLance  Furusawa-Stratton M.D.   On: 01/31/2016 00:19    Scheduled Meds: . ceFEPime (MAXIPIME) IV  2 g Intravenous Q24H  . ferrous sulfate  325 mg Oral Q breakfast  . furosemide  20 mg Intravenous BID  . insulin aspart  0-9  Units Subcutaneous TID WC  . ipratropium-albuterol  3 mL Nebulization Q4H  . lactobacillus acidophilus  1 tablet Oral Daily  . metoprolol tartrate  25 mg Oral BID  . mometasone-formoterol  2 puff Inhalation BID  . pantoprazole  40 mg Oral Daily  . potassium chloride  20 mEq Oral Once  . rivaroxaban  20 mg Oral Q supper  . sodium chloride flush  3 mL Intravenous Q12H  . spironolactone  25 mg Oral Daily  . vancomycin  1,250 mg Intravenous Q24H   Continuous Infusions: . diltiazem (CARDIZEM) infusion 15 mg/hr (01/31/16 1026)   PRN Meds:.acetaminophen **OR** acetaminophen, HYDROcodone-acetaminophen, ipratropium-albuterol, LORazepam, ondansetron **OR** ondansetron (ZOFRAN) IV  ASSESSMENT AND PLAN:  atrial fibrillation with rapid ventricle response.  CHA2DS2-VASCScore: Risk Score  4(without stroke as it was related to aneurysm),  Yearly risk of stroke  4%. 2. Hypertension 3. Hyperlipidemia 4. Diabetes mellitus type 2 uncontrolled without use of insulin 5. Morbid obesity 6. COPD with acute exacerbation, history of 40-pack-year history of smoking, quit 2006 7. History of cerebral aneurysm S/P coiling with residual aneurysm present. 8. Chronic iron deficiency anemia  Recommendation:Echocardiogram reviewed, patient has presented LVEF, suspect presentation is m consistent with acute on chronic diastolic heart failure associated with COPD exacerbation and also atrial fibrillation with RVR  Blood pressure is soft, however control of heart rate will improve blood pressure control.  I well increase her diltiazem dose and add  Beta blocker therapy for the same and continue to follow patient closely.  Continue diuresis for now. BNP in AM  Yates DecampGANJI, Antuan Limes, MD 01/31/2016, 12:23 PM [facility] [place] Pager: [phone] Office: [phone] If no  answer Cell [phone]

## 2016-01-31 NOTE — Progress Notes (Signed)
PROGRESS NOTE    [name]  CWU:[id number] DOB: 02-04-1946 DOA: 01/30/2016 PCP: [name], MD   Brief Narrative:  [name] is a 70 y.o. female admitted 3 weeks ago for respiratory failure and encephalopathy with known hx of sleep apnea non compliant with CPAP, Chronic Afib on Anticoagulation, COPD, and DM, presents with worsening shortness of breath. Patient states she has chronic shortness of breath which had acutely worsened today with increasing lower extremity edema. and chest tightness. Early in the morning at her house, patient also had a fall and hit her head but did not lose consciousness after she lost her balance. CXR showed congestion and mild pleural effusion with possible infiltrates. Patient on exam, is mildly short of breath but able to complete sentences. JVD was elevated with bilateral lower extremity edema and mild wheezing. Since CXR was showing possible infiltrates patient was started on antibiotics for pneumonia . Patient also has clinical features consistent with CHF and had been given lasix 40mg  and placed on BiPAP. Patient was admitted for further managemt of acue respiratory failure possibly a combination of CHF and COPD with possible pneumonia. Dr. Jacinto Halim her primary Cardiologist was consulted for further recommendations.   Assessment & Plan:   Principal Problem:   Acute on chronic respiratory failure with hypoxia and hypercapnia (HCC) Active Problems:   Diabetes mellitus type II, uncontrolled (HCC)   OSA (obstructive sleep apnea)   Iron deficiency anemia   Hypertension   COPD exacerbation (HCC)   Type 2 diabetes mellitus without complication, without long-term current use of insulin (HCC)   Acute respiratory failure (HCC)  Acute on Chronic Hypercapnic Hypoxic Respiratory Failure Requiring NIPPV likely 2/2 to supected Acute Exacerbation of CHF with likely concomitant COPD and ? HCAP poA -Repeat ABG this AM improved. Patient weaned from BiPAP to 5 L of   (baseline at Home is 3-4) -Continue to Monitor O2 Sats and Maintain Sats >92%  Supected Acute Exacerbation of CHF  -No documented Hx of CHF and last ECHO done 02/08/15 showed Left ventricle: The cavity size was normal. Systolic function was  normal. The estimated ejection fraction was in the range of 55% to 60%. Wall motion was normal; there were no regional wall  motion abnormalities.- Left atrium: The atrium was mildly dilated. -Repeat ECHOcardiogram done and pending read.  -C/w Daily Weights, Strict I's and O's, 1200 mL Fluid Restriction and IV Diuresis with IV 20 mg BID; (recieved IV 40 last night) -Appreciate Dr. Cardiology's Dr. Verl Dicker Input and Recc's -Will obtain BNP and Repeat CXR in AM -Cardiact Troponins Negative x3 -C/w Aldactone 25 mg po Daily -CXR Showed Mild enlargement of the heart size with interval increase in central vascular congestion and interstitial pulmonary edema. Diffuse interstitial opacities, consistent with pulmonary edema, also increased.  COPD and ? HCAP poA -CXR showed Ill-defined opacities in the lung bases could relate to atelectasis or infiltrate. Tiny bilateral effusions. -Patients WBC was 14.9 on Admission -> Improved to 9.7 -Will continue IV Abx of Cefepime and IV Vancomycin  -C/w Duo Neb q4h and q2hprn and with Dulera -Repeat CXR in AM  Atrial Fibrillation with RVR (hx cardioversion 09/2015) - Chads2vasc score is 4.  - C/w Bedside Telemetry - Patient is on Xarelto for Anticoagulation.  - Consulted Dr. [name] in Cardiology. Appreciate Input and Recc's - Repeat Echocardiogram pending read - Continue Cardizem gtt, po Cardizem 240 mg and Metoprolol IV 5 mg q4hprn and Metoprolol 25 mg po BID .   DM  type 2 - Will be on Sensitive sliding scale coverage.  - May need to Adjust as BS has been Elevated on BMP of 349.   CKD stage III. - Patient's BUN/Cr was 18/1.40 (was previously 13/1.38) - Repeat CMP in AM  Chronic Iron deficiency Anemia - On iron  replacement with Ferrous Sulfate 325 mg po Daily.  - Hemoglobin at baseline at 9.1/30.9  OSA.  -Non compliant with CPAP.  -Patient was placed on BiPAP and transitioned to 5 L [place]. - May need BiPAP qHS   Mechanical Fall. - Patient has a history of brain aneurysm with coiling.  - CT head showed Left internal carotid artery terminus and basilar coil masses with extensive streak artifact obscuring the brain at those levels. Within the visualized brain no acute abnormality is identified. Grossly stable moderate chronic microvascular ischemic changes given differences in technique.  Right Sided Rib Pain - Likely 2/2 to Mechanical Fall - C/w Norco 1 tablet q6hprn - Continue to Monitor  DVT prophylaxis:  Anticoagulated with Xarelto Code Status: FULL Family Communication: No Family At Bedside Disposition Plan: Likely SNF  Consultants:  Cardiology Dr. [name]  Procedures: None  Antimicrobials: IV Cefepime and IV Vancomycin  Subjective: Patient was seen and examined at bedside this AM and she was wearing BiPAP and stated that her SOB was better. Her main complaint this AM was rightsided Rib Pain. Admitted to gaining weight and having LE edema. No other complaints or concerns at this time.   Objective: Vitals:   01/31/16 1014 01/31/16 1158 01/31/16 1242 01/31/16 1253  BP: 133/89  (!) 116/58   Pulse:      Resp:      Temp:  98.3 F (36.8 C)    TempSrc:  Axillary    SpO2:    95%  Weight:      Height:        Intake/Output Summary (Last 24 hours) at 01/31/16 1556 Last data filed at 01/31/16 1334  Gross per 24 hour  Intake           871.67 ml  Output              175 ml  Net           696.67 ml   Filed Weights   01/30/16 1716 01/31/16 0500  Weight: 108.9 kg (240 lb) 108.8 kg (239 lb 13.8 oz)    Examination: Physical Exam:  Constitutional: WN/WD, Mild distress wearing BiPAP Eyes:  lids and conjunctivae normal, sclerae anicteric  ENMT: External Ears, Nose appear normal.  Grossly normal hearing.  Neck: Appears normal, supple, no cervical masses, Respiratory: Diminished bilaterally with crackles. No appreciable rhonchi. Normal respiratory effort with slight tachypenia. Wearing BiPAP Cardiovascular: Irregularly Irregular Rhythm; No murmurs / rubs / gallops. S1 and S2 auscultated. 2+ pitting edema. Abdomen: Soft, non-tender, non-distended. No masses palpated. No appreciable hepatosplenomegaly. Bowel sounds positive x4.  GU: Deferred. Musculoskeletal: No clubbing / cyanosis of digits/nails. No joint deformity upper and lower extremities. No contractures Skin: No rashes, lesions, ulcers on limited skin exam. No induration; Warm and dry.  Neurologic: CN 2-12 grossly intact with no focal deficits. Sensation intact in all 4 Extremities. Romberg sign cerebellar reflexes not assessed.  Psychiatric: Normal judgment and insight. Alert and oriented x 3. Anxious mood and appropriate affect.   Data Reviewed: I have personally reviewed following labs and imaging studies  CBC:  Recent Labs Lab 01/30/16 1910 01/31/16 0448  WBC 14.9* 9.7  HGB 9.6* 9.1*  HCT 34.2* 30.9*  MCV 95.0 90.9  PLT 272 244   Basic Metabolic Panel:  Recent Labs Lab 01/30/16 1910 01/31/16 0140 01/31/16 0448  NA 138  --  135  K 3.4*  --  3.7  CL 85*  --  84*  CO2 41*  --  41*  GLUCOSE 301*  --  349*  BUN 13  --  18  CREATININE 1.38*  --  1.40*  CALCIUM 9.8  --  9.0  MG  --  1.9  --    GFR: Estimated Creatinine Clearance: 47.5 mL/min (by C-G formula based on SCr of 1.4 mg/dL (H)). Liver Function Tests: No results for input(s): AST, ALT, ALKPHOS, BILITOT, PROT, ALBUMIN in the last 168 hours. No results for input(s): LIPASE, AMYLASE in the last 168 hours. No results for input(s): AMMONIA in the last 168 hours. Coagulation Profile: No results for input(s): INR, PROTIME in the last 168 hours. Cardiac Enzymes:  Recent Labs Lab 01/31/16 0140 01/31/16 0448 01/31/16 1103  TROPONINI  0.03* 0.03* <0.03   BNP (last 3 results) No results for input(s): PROBNP in the last 8760 hours. HbA1C: No results for input(s): HGBA1C in the last 72 hours. CBG:  Recent Labs Lab 01/31/16 0039 01/31/16 0748 01/31/16 1229  GLUCAP 393* 297* 228*   Lipid Profile: No results for input(s): CHOL, HDL, LDLCALC, TRIG, CHOLHDL, LDLDIRECT in the last 72 hours. Thyroid Function Tests:  Recent Labs  01/31/16 0140  TSH 1.990   Anemia Panel: No results for input(s): VITAMINB12, FOLATE, FERRITIN, TIBC, IRON, RETICCTPCT in the last 72 hours. Sepsis Labs: No results for input(s): PROCALCITON, LATICACIDVEN in the last 168 hours.  Recent Results (from the past 240 hour(s))  MRSA PCR Screening     Status: None   Collection Time: 01/31/16  6:28 AM  Result Value Ref Range Status   MRSA by PCR NEGATIVE NEGATIVE Final    Comment:        The GeneXpert MRSA Assay (FDA approved for NASAL specimens only), is one component of a comprehensive MRSA colonization surveillance program. It is not intended to diagnose MRSA infection nor to guide or monitor treatment for MRSA infections.     Radiology Studies: Dg Chest 2 View  Result Date: 01/30/2016 CLINICAL DATA:  Increasing dyspnea for 2 days history of COPD EXAM: CHEST  2 VIEW COMPARISON:  01/09/2016, 09/25/2015, 02/20/2015 FINDINGS: Heart size is slightly enlarged. Possible tiny pleural effusions. Linear atelectasis or infiltrate at the bilateral lung bases. Interval increase in central vascular congestion. Diffuse interstitial opacities, consistent with pulmonary edema, also increased. Right middle lobe atelectasis or infiltrate. Atherosclerosis of the aorta. Biapical scarring. Prominent right hilus unchanged. Multiple old left rib fractures. IMPRESSION: 1. Mild enlargement of the heart size with interval increase in central vascular congestion and interstitial pulmonary edema. 2. Ill-defined opacities in the lung bases could relate to  atelectasis or infiltrate. Tiny bilateral effusions. 3. Mild asymmetric right hilar enlargement could relate to dilated pulmonary vessels. Radiographic follow-up is advised Electronically Signed   By: Jasmine Pang M.D.   On: 01/30/2016 19:01   Ct Head Wo Contrast  Result Date: 01/31/2016 CLINICAL DATA:  70 y/o  F; status post fall. EXAM: CT HEAD WITHOUT CONTRAST TECHNIQUE: Contiguous axial images were obtained from the base of the skull through the vertex without intravenous contrast. COMPARISON:  01/29/2009 MR brain. 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 Within the  visualized brain there is no evidence for large acute infarct, focal mass effect, intra-axial hemorrhage, or hydrocephalus. Nonspecific foci of hypoattenuation in subcortical and periventricular white matter grossly corresponded T2 FLAIR signal abnormality on the prior MRI and are compatible with moderate chronic microvascular ischemic changes. Probable chronic lacunar infarct within the left lentiform nucleus. Vascular: As above. Skull:  No displaced calvarial fracture. Sinuses/Orbits: Paranasal sinus mucosal thickening in maxillary and sphenoid sinuses. Mastoid air cells are partially opacified on the left and normally aerated on the right. Orbits are unremarkable. Other: None. 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 Within the visualized brain no acute abnormality is identified. Grossly stable moderate chronic microvascular ischemic changes given differences in technique. Electronically Signed   By: Mitzi Hansen M.D.   On: 01/31/2016 00:19   ECHOCARDIOGRAM: PENDING  Scheduled Meds: . ceFEPime (MAXIPIME) IV  2 g Intravenous Q24H  . diltiazem  240 mg Oral Daily  . ferrous sulfate  325 mg Oral Q breakfast  . furosemide   20 mg Intravenous BID  . insulin aspart  0-9 Units Subcutaneous TID WC  . ipratropium-albuterol  3 mL Nebulization Q4H  . lactobacillus acidophilus  1 tablet Oral Daily  . metoprolol tartrate  25 mg Oral BID  . mometasone-formoterol  2 puff Inhalation BID  . pantoprazole  40 mg Oral Daily  . potassium chloride  20 mEq Oral Once  . rivaroxaban  20 mg Oral Q supper  . sodium chloride flush  3 mL Intravenous Q12H  . spironolactone  25 mg Oral Daily  . vancomycin  1,250 mg Intravenous Q24H   Continuous Infusions: . diltiazem (CARDIZEM) infusion 15 mg/hr (01/31/16 1300)    LOS: 1 day   Merlene Laughter, DO [facility] Pager [phone]  If 7PM-7AM, please contact night-coverage [email or web address] Password [email or web address] 01/31/2016, 3:56 PM

## 2016-01-31 NOTE — ED Provider Notes (Signed)
I was called to room as pt went into afib with RVR She is on bipap, but resting comfortably, no distress and no new complaints   EKG Interpretation  Date/Time:  Thursday January 31 2016 03:04:02 EDT Ventricular Rate:  146 PR Interval:    QRS Duration: 79 QT Interval:  288 QTC Calculation: 449 R Axis:   25 Text Interpretation:  Atrial fibrillation with rapid V-rate Abnormal R-wave progression, early transition Minimal ST depression, inferior leads Borderline T wave abnormalities Abnormal ekg Confirmed by Bebe Shaggy  MD, Caylyn Tedeschi ([id number]) on 01/31/2016 3:06:26 AM      She is awaiting admission Will start cardizem    Zadie Rhine, MD 01/31/16 [phone]

## 2016-01-31 NOTE — Progress Notes (Signed)
Called Dr Nadara Eaton regarding pt's HR sustaining in 120/130's.  Orders to increase the diltiazem PO to 240mg  ER cap daily, 24hr.  Also added PRN lopressor 5mg  for HR> 100.  Suspects this may be related to her HF.    , RN

## 2016-01-31 NOTE — ED Notes (Signed)
RT at the bedside to place pt on CPAP.

## 2016-01-31 NOTE — Progress Notes (Signed)
  Echocardiogram 2D Echocardiogram has been performed.  [name] 01/31/2016, 11:33 AM

## 2016-01-31 NOTE — Progress Notes (Addendum)
Inpatient Diabetes Program Recommendations  AACE/ADA: New Consensus Statement on Inpatient Glycemic Control (2015)  Target Ranges:  Prepandial:   less than 140 mg/dL      Peak postprandial:   less than 180 mg/dL (1-2 hours)      Critically ill patients:  140 - 180 mg/dL   Lab Results  Component Value Date   GLUCAP 297 (H) 01/31/2016   HGBA1C 7.5 (H) 01/06/2016    Review of Glycemic Control Results for Jaime, Mccormick (MRN [id number]) as of 01/31/2016 08:22  Ref. Range 01/31/2016 00:39 01/31/2016 07:48  Glucose-Capillary Latest Ref Range: 65 - 99 mg/dL 497 (H) 026 (H)   Diabetes history: DM2 Outpatient Diabetes medications: Metformin 1 gm bid Current orders for Inpatient glycemic control: Novolog correction 0-9 units tid  Inpatient Diabetes Program Recommendations:    Reviewed CBGs and Novolog correction to start this am. -Add Novolog correction 0-5 units hs -Basal Lantus while Metformin on hold. 0.2 units/kg = approx 20 units. Will follow while in the hospital.  Thank you, [name]. Leo Weyandt, RN, MSN, CDE Inpatient Glycemic Control Team Team Pager [phone] (8am-5pm) 01/31/2016 8:28 AM

## 2016-01-31 NOTE — ED Notes (Signed)
Pt noted to be in afib RVR, HR 155. Pt was resting when rhythm changed. Repeat ekg given to Dr.Wickline

## 2016-01-31 NOTE — ED Notes (Signed)
Went to go check pt blood sugar and she had taken her blood pressure cuff off. She said she did not want it back on her arm.

## 2016-02-01 ENCOUNTER — Inpatient Hospital Stay (HOSPITAL_COMMUNITY): Payer: Medicare Other

## 2016-02-01 DIAGNOSIS — D509 Iron deficiency anemia, unspecified: Secondary | ICD-10-CM

## 2016-02-01 DIAGNOSIS — I5033 Acute on chronic diastolic (congestive) heart failure: Secondary | ICD-10-CM

## 2016-02-01 LAB — GLUCOSE, CAPILLARY
GLUCOSE-CAPILLARY: 197 mg/dL — AB (ref 65–99)
GLUCOSE-CAPILLARY: 150 mg/dL — AB (ref 65–99)
GLUCOSE-CAPILLARY: 204 mg/dL — AB (ref 65–99)
GLUCOSE-CAPILLARY: 223 mg/dL — AB (ref 65–99)
Glucose-Capillary: 183 mg/dL — ABNORMAL HIGH (ref 65–99)

## 2016-02-01 LAB — CBC WITH DIFFERENTIAL/PLATELET
BASOS PCT: 0 %
Basophils Absolute: 0 10*3/uL (ref 0.0–0.1)
EOS ABS: 0.1 10*3/uL (ref 0.0–0.7)
EOS PCT: 1 %
HCT: 28.4 % — ABNORMAL LOW (ref 36.0–46.0)
HEMOGLOBIN: 8.5 g/dL — AB (ref 12.0–15.0)
Lymphocytes Relative: 9 %
Lymphs Abs: 1.4 10*3/uL (ref 0.7–4.0)
MCH: 26.9 pg (ref 26.0–34.0)
MCHC: 29.9 g/dL — AB (ref 30.0–36.0)
MCV: 89.9 fL (ref 78.0–100.0)
MONOS PCT: 9 %
Monocytes Absolute: 1.5 10*3/uL — ABNORMAL HIGH (ref 0.1–1.0)
NEUTROS PCT: 81 %
Neutro Abs: 13.7 10*3/uL — ABNORMAL HIGH (ref 1.7–7.7)
PLATELETS: 281 10*3/uL (ref 150–400)
RBC: 3.16 MIL/uL — ABNORMAL LOW (ref 3.87–5.11)
RDW: 18 % — AB (ref 11.5–15.5)
WBC: 16.8 10*3/uL — ABNORMAL HIGH (ref 4.0–10.5)

## 2016-02-01 LAB — PHOSPHORUS: PHOSPHORUS: 4.1 mg/dL (ref 2.5–4.6)

## 2016-02-01 LAB — COMPREHENSIVE METABOLIC PANEL
ALBUMIN: 2.8 g/dL — AB (ref 3.5–5.0)
ALK PHOS: 36 U/L — AB (ref 38–126)
ALT: 13 U/L — ABNORMAL LOW (ref 14–54)
ANION GAP: 7 (ref 5–15)
AST: 17 U/L (ref 15–41)
BUN: 20 mg/dL (ref 6–20)
CHLORIDE: 81 mmol/L — AB (ref 101–111)
CO2: 46 mmol/L — AB (ref 22–32)
Calcium: 8.5 mg/dL — ABNORMAL LOW (ref 8.9–10.3)
Creatinine, Ser: 1.56 mg/dL — ABNORMAL HIGH (ref 0.44–1.00)
GFR calc non Af Amer: 33 mL/min — ABNORMAL LOW (ref >60)
GFR, EST AFRICAN AMERICAN: 38 mL/min — AB (ref >60)
GLUCOSE: 190 mg/dL — AB (ref 65–99)
POTASSIUM: 4.3 mmol/L (ref 3.5–5.1)
SODIUM: 134 mmol/L — AB (ref 135–145)
Total Bilirubin: 0.4 mg/dL (ref 0.3–1.2)
Total Protein: 5.6 g/dL — ABNORMAL LOW (ref 6.5–8.1)

## 2016-02-01 LAB — BRAIN NATRIURETIC PEPTIDE: B NATRIURETIC PEPTIDE 5: 466.8 pg/mL — AB (ref 0.0–100.0)

## 2016-02-01 LAB — MAGNESIUM: Magnesium: 1.6 mg/dL — ABNORMAL LOW (ref 1.7–2.4)

## 2016-02-01 NOTE — Consult Note (Signed)
   [facility] CM Inpatient Consult   02/01/2016  [name] [birth year] [id number]    [facility] Care Management follow up:  Went to bedside to speak with [name] at bedside in stepdown unit. She is active with [facility] Care Management program. Agreeable to ongoing [facility] Care Management follow up post discharge. Reports she plans on returning home discharge instead of rehab. States " I can get rehab at home". She reports that she is also active with [facility]. Writer encouraged her to at least consider rehab at discharge.   Also discussed that she will need wheelchair ordered if returning home at discharge. [name] states when she gets short of breath at home, it is difficult to maneuver around with her walker/rollator. Will make inpatient RNCM aware of the above.   Contact information left at bedside. Will continue to follow.    Raiford Noble, MSN-Ed, RN,BSN [facility] Liaison [phone]

## 2016-02-01 NOTE — Discharge Instructions (Signed)

## 2016-02-01 NOTE — Care Management Note (Addendum)
Case Management Note  Patient Details  Name: LAREINA ESPINO MRN: [id number] Date of Birth: 01-Oct-1945  Subjective/Objective:  Sister lives with patient, she is currently active with [facility] and [facility] for RN, AIDE, PT.  Patient has a rollator that she uses but states she really needs a w/chair.  She states she just got the rollator in August, NCM informed her that she will have to pay for the w/chair since she has not had the rollator over 5 years.  She has home oxygen with [facility] , 4 liters at home.  NCM will cont to follow for dc needs.                   Action/Plan:   Expected Discharge Date:                  Expected Discharge Plan:  Home w Home Health Services  In-House Referral:  Clinical Social Work  Discharge planning Services  CM Consult  Post Acute Care Choice:  Resumption of Svcs/PTA Provider Choice offered to:     DME Arranged:    DME Agency:     HH Arranged:  RN, PT, Nurse's Aide HH Agency:  [facility]  Status of Service:  In process, will continue to follow  If discussed at Long Length of Stay Meetings, dates discussed:    Additional Comments: 10/17  9:21 Letha Cape RN, CM-Patient will be HRI with [facility] if she goes home per Medical Advisor in [place].    Leone Haven, RN 02/01/2016, 5:02 PM

## 2016-02-01 NOTE — Care Management Important Message (Signed)
Important Message  Patient Details  Name: [name] MRN: [id number] Date of Birth: 06-Feb-1946   Medicare Important Message Given:  Yes    Leone Haven, RN 02/01/2016, 4:56 PMImportant Message  Patient Details  Name: [name] MRN: [id number] Date of Birth: [birth year]   Medicare Important Message Given:  Yes    Leone Haven, RN 02/01/2016, 4:56 PM

## 2016-02-01 NOTE — Consult Note (Signed)
   [facility] CM Inpatient Consult   02/01/2016  [name] 1945/06/07 [id number]   Patient active with [facility] Care Management. Please see patient outreach details from [facility] team under chart review tab then notes. Went to bedside. However, [name] was actively being transferred off unit to stepdown unit. Will follow up.    Raiford Noble, MSN-Ed, RN,BSN [facility] Liaison [phone]

## 2016-02-01 NOTE — Progress Notes (Signed)
PROGRESS NOTE    VERITA KURODA  TKZ:[id number] DOB: 1946-02-28 DOA: 01/30/2016 PCP: [name], MD   Brief Narrative:  [name] is a 70 y.o. female admitted 3 weeks ago for respiratory failure and encephalopathy with known hx of sleep apnea non compliant with CPAP, Chronic Afib on Anticoagulation, COPD, and DM, presents with worsening shortness of breath. Patient states she has chronic shortness of breath which had acutely worsened today with increasing lower extremity edema. and chest tightness. Early in the morning at her house, patient also had a fall and hit her head but did not lose consciousness after she lost her balance. CXR showed congestion and mild pleural effusion with possible infiltrates. Patient on exam, is mildly short of breath but able to complete sentences. JVD was elevated with bilateral lower extremity edema and mild wheezing. Since CXR was showing possible infiltrates patient was started on antibiotics for pneumonia . Patient also has clinical features consistent with CHF and had been given lasix 40mg  and placed on BiPAP. Patient was admitted for further managemt of acue respiratory failure possibly a combination of CHF and COPD with possible pneumonia. Dr. her primary Cardiologist was consulted for further recommendations. Patient converted to NSR and was transferred out of SDU.   Assessment & Plan:   Principal Problem:   Acute on chronic respiratory failure with hypoxia and hypercapnia (HCC) Active Problems:   Diabetes mellitus type II, uncontrolled (HCC)   OSA (obstructive sleep apnea)   Iron deficiency anemia   Hypertension   COPD exacerbation (HCC)   Type 2 diabetes mellitus without complication, without long-term current use of insulin (HCC)   Acute respiratory failure (HCC)  Acute on Chronic Hypercapnic Hypoxic Respiratory Failure Requiring NIPPV likely 2/2 to supected Acute Exacerbation of CHF with likely concomitant COPD and ? HCAP poA -Patient  weaned from BiPAP to 5 L of [place] (baseline at Home is 3-4) -Continue to Monitor O2 Sats and Maintain Sats >92%  Supected Acute Decompensation of Chronic Diastolic of CHF with EF of 60-65% -No documented Hx of CHF and last ECHO done 02/08/15 showed Left ventricle: The cavity size was normal. Systolic function was  normal. The estimated ejection fraction was in the range of 55% to 60%. Wall motion was normal; there were no regional wall  motion abnormalities.- Left atrium: The atrium was mildly dilated. -Repeat ECHOcardiogram done and showed Systolic Function was vigorous with Estimated EF of 65-70%. The study is not technically sufficient to allow evaluation of LV diastolic function -C/w Daily Weights, Strict I's and O's, 1200 mL Fluid Restriction and IV Diuresis with IV 20 mg BID;  -Appreciate Dr. Cardiology's Dr. 02/10/15 Input and Recc's -BNP was 466.8; -Cardiac Troponins Negative x3 -C/w Aldactone 25 mg po Daily -CXR yesterday Showed Mild enlargement of the heart size with interval increase in central vascular congestion and interstitial pulmonary edema. Diffuse interstitial opacities, consistent with pulmonary edema, also increased. -CXR today showed The appearance of the chest suggests mild congestive heart failure. Bibasilar subsegmental atelectasis. Emphysema. Aortic atherosclerosis.  COPD and ? HCAP poA -CXR showed Ill-defined opacities in the lung bases could relate to atelectasis or infiltrate. Tiny bilateral effusions. -Patients WBC was 14.9 on Admission -> 9.7 -> 16.8 (unclear Etiology of Leukocytosis as patient is on Abx) -Will continue IV Abx of Cefepime and IV Vancomycin for now as Leukocytosis increased -C/w Duo Neb q4h and q2hprn and with Dulera -Repeat CXR showed Bibasilar Subsegmental Atelectasis.  -Repeat CBC in AM with Differential  Atrial  Fibrillation with RVR (hx cardioversion 09/2015) s/p Spontaneous Conversion to NSR - Chads2vasc score is 4.  - C/w Telemetry - Patient  is on Xarelto for Anticoagulation.  - Consulted Dr. [name] in Cardiology. Appreciate Input and Recc's - Repeat Echocardiogram pending read - Continue Cardizem gtt, po Cardizem 240 mg and Metoprolol IV 5 mg q4hprn and Metoprolol 25 mg po BID . -Appreciate Additional Recc's  DM type 2 - Blood Sugar on BMP was 190 - Will be on Sensitive sliding scale coverage.  - May need to Adjust as BS has been Elevated on BMP of 349.   CKD stage III. - Patient's BUN/Cr was 20/1.56 and yesterday was 18/1.40 (was previously 13/1.38) - Repeat CMP in AM  Chronic Iron deficiency Anemia - On iron replacement with Ferrous Sulfate 325 mg po Daily.  - Hemoglobin went from 9.1/30.9 -> 8.5/28.4 - Repeat CBC in AM with Diff  OSA.  -Non compliant with CPAP and did not wear last night.  -Patient was placed on BiPAP and transitioned to 5 L [place]. - May need BiPAP qHS   Mechanical Fall. - Patient has a history of brain aneurysm with coiling.  - CT head showed Left internal carotid artery terminus and basilar coil masses with extensive streak artifact obscuring the brain at those levels. Within the visualized brain no acute abnormality is identified. Grossly stable moderate chronic microvascular ischemic changes given differences in technique.  Right Sided Rib Pain - Improved Pain control with Norco.  - Likely 2/2 to Mechanical Fall - C/w Norco 1 tablet q6hprn - Continue to Monitor  DVT prophylaxis:  Anticoagulated with Xarelto Code Status: FULL Family Communication: No Family At Bedside Disposition Plan: Likely SNF  Consultants:  Cardiology Dr. [name]  Procedures: None  Antimicrobials: IV Cefepime and IV Vancomycin  Subjective: Patient was seen and examined at bedside this AM and she stated she was doing tremendously better breathing wise. Was happy that her heart rate converted spontaneously. Stated pain was decreased in her back. No Cp, but stated was still a little SOB but not as bad. No  N/V/Lightheadedness or Dizziness. No other complaints or concerns.   Objective: Vitals:   02/01/16 1054 02/01/16 1100 02/01/16 1113 02/01/16 1200  BP: (!) 134/46   (!) 135/47  Pulse: 87 97  88  Resp:  20  20  Temp:    97.5 F (36.4 C)  TempSrc:    Oral  SpO2:  91% 94% 95%  Weight:      Height:        Intake/Output Summary (Last 24 hours) at 02/01/16 1349 Last data filed at 02/01/16 1100  Gross per 24 hour  Intake          1002.42 ml  Output             1001 ml  Net             1.42 ml   Filed Weights   01/30/16 1716 01/31/16 0500 02/01/16 0410  Weight: 108.9 kg (240 lb) 108.8 kg (239 lb 13.8 oz) 108.5 kg (239 lb 3.2 oz)    Examination: Physical Exam:  Constitutional: WN/WD, NAD Eyes:  lids and conjunctivae normal, sclerae anicteric  ENMT: External Ears, Nose appear normal. Grossly normal hearing.  Neck: Appears normal, supple, no cervical masses, Respiratory: Diminished bilaterally with crackles. No appreciable rhonchi. Normal respiratory effort with slight tachypenia.  Cardiovascular: RRR; No murmurs / rubs / gallops. S1 and S2 auscultated. 1+ pitting edema. Abdomen: Soft,  non-tender, non-distended. No masses palpated. No appreciable hepatosplenomegaly. Bowel sounds positive x4.  GU: Deferred. Musculoskeletal: No clubbing / cyanosis of digits/nails. No joint deformity upper and lower extremities. No contractures Skin: No rashes, lesions, ulcers on limited skin exam. No induration; Warm and dry.  Neurologic: CN 2-12 grossly intact with no focal deficits. Sensation intact in all 4 Extremities. Romberg sign cerebellar reflexes not assessed.  Psychiatric: Normal judgment and insight. Alert and oriented x 3. Anxious mood and appropriate affect.   Data Reviewed: I have personally reviewed following labs and imaging studies  CBC:  Recent Labs Lab 01/30/16 1910 01/31/16 0448 02/01/16 0311  WBC 14.9* 9.7 16.8*  NEUTROABS  --   --  13.7*  HGB 9.6* 9.1* 8.5*  HCT 34.2*  30.9* 28.4*  MCV 95.0 90.9 89.9  PLT 272 244 281   Basic Metabolic Panel:  Recent Labs Lab 01/30/16 1910 01/31/16 0140 01/31/16 0448 02/01/16 0311  NA 138  --  135 134*  K 3.4*  --  3.7 4.3  CL 85*  --  84* 81*  CO2 41*  --  41* 46*  GLUCOSE 301*  --  349* 190*  BUN 13  --  18 20  CREATININE 1.38*  --  1.40* 1.56*  CALCIUM 9.8  --  9.0 8.5*  MG  --  1.9  --  1.6*  PHOS  --   --   --  4.1   GFR: Estimated Creatinine Clearance: 42.6 mL/min (by C-G formula based on SCr of 1.56 mg/dL (H)). Liver Function Tests:  Recent Labs Lab 02/01/16 0311  AST 17  ALT 13*  ALKPHOS 36*  BILITOT 0.4  PROT 5.6*  ALBUMIN 2.8*   No results for input(s): LIPASE, AMYLASE in the last 168 hours. No results for input(s): AMMONIA in the last 168 hours. Coagulation Profile: No results for input(s): INR, PROTIME in the last 168 hours. Cardiac Enzymes:  Recent Labs Lab 01/31/16 0140 01/31/16 0448 01/31/16 1103  TROPONINI 0.03* 0.03* <0.03   BNP (last 3 results) No results for input(s): PROBNP in the last 8760 hours. HbA1C: No results for input(s): HGBA1C in the last 72 hours. CBG:  Recent Labs Lab 01/31/16 1229 01/31/16 1637 01/31/16 2202 02/01/16 0821 02/01/16 1204  GLUCAP 228* 197* 197* 150* 183*   Lipid Profile: No results for input(s): CHOL, HDL, LDLCALC, TRIG, CHOLHDL, LDLDIRECT in the last 72 hours. Thyroid Function Tests:  Recent Labs  01/31/16 0140  TSH 1.990   Anemia Panel: No results for input(s): VITAMINB12, FOLATE, FERRITIN, TIBC, IRON, RETICCTPCT in the last 72 hours. Sepsis Labs: No results for input(s): PROCALCITON, LATICACIDVEN in the last 168 hours.  Recent Results (from the past 240 hour(s))  MRSA PCR Screening     Status: None   Collection Time: 01/31/16  6:28 AM  Result Value Ref Range Status   MRSA by PCR NEGATIVE NEGATIVE Final    Comment:        The GeneXpert MRSA Assay (FDA approved for NASAL specimens only), is one component of  a comprehensive MRSA colonization surveillance program. It is not intended to diagnose MRSA infection nor to guide or monitor treatment for MRSA infections.     Radiology Studies: Dg Chest 2 View  Result Date: 02/01/2016 CLINICAL DATA:  70 year old female with history of congestive heart failure. EXAM: CHEST  2 VIEW COMPARISON:  Chest x-ray 01/30/2016. FINDINGS: The status changes are noted throughout the lungs bilaterally. There is cephalization of the pulmonary vasculature and slight indistinctness  of the interstitial markings suggestive of mild pulmonary edema. Small bilateral pleural effusions. Bibasilar subsegmental atelectasis. Heart size is mildly enlarged. Upper mediastinal contours are distorted by patient's rotation the left. Atherosclerosis in the thoracic aorta. IMPRESSION: 1. The appearance of the chest suggests mild congestive heart failure. 2. Bibasilar subsegmental atelectasis. 3. Emphysema. 4. Aortic atherosclerosis. Electronically Signed   By: Trudie Reed M.D.   On: 02/01/2016 07:01   Dg Chest 2 View  Result Date: 01/30/2016 CLINICAL DATA:  Increasing dyspnea for 2 days history of COPD EXAM: CHEST  2 VIEW COMPARISON:  01/09/2016, 09/25/2015, 02/20/2015 FINDINGS: Heart size is slightly enlarged. Possible tiny pleural effusions. Linear atelectasis or infiltrate at the bilateral lung bases. Interval increase in central vascular congestion. Diffuse interstitial opacities, consistent with pulmonary edema, also increased. Right middle lobe atelectasis or infiltrate. Atherosclerosis of the aorta. Biapical scarring. Prominent right hilus unchanged. Multiple old left rib fractures. IMPRESSION: 1. Mild enlargement of the heart size with interval increase in central vascular congestion and interstitial pulmonary edema. 2. Ill-defined opacities in the lung bases could relate to atelectasis or infiltrate. Tiny bilateral effusions. 3. Mild asymmetric right hilar enlargement could relate  to dilated pulmonary vessels. Radiographic follow-up is advised Electronically Signed   By: Jasmine Pang M.D.   On: 01/30/2016 19:01   Ct Head Wo Contrast  Result Date: 01/31/2016 CLINICAL DATA:  70 y/o  F; status post fall. EXAM: CT HEAD WITHOUT CONTRAST TECHNIQUE: Contiguous axial images were obtained from the base of the skull through the vertex without intravenous contrast. COMPARISON:  01/29/2009 MR brain. 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 Within the visualized brain there is no evidence for large acute infarct, focal mass effect, intra-axial hemorrhage, or hydrocephalus. Nonspecific foci of hypoattenuation in subcortical and periventricular white matter grossly corresponded T2 FLAIR signal abnormality on the prior MRI and are compatible with moderate chronic microvascular ischemic changes. Probable chronic lacunar infarct within the left lentiform nucleus. Vascular: As above. Skull:  No displaced calvarial fracture. Sinuses/Orbits: Paranasal sinus mucosal thickening in maxillary and sphenoid sinuses. Mastoid air cells are partially opacified on the left and normally aerated on the right. Orbits are unremarkable. Other: None. 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 Within the visualized brain no acute abnormality is identified. Grossly stable moderate chronic microvascular ischemic changes given differences in technique. Electronically Signed   By: Mitzi Hansen M.D.   On: 01/31/2016 00:19   ECHOCARDIOGRAM:  Study Conclusions  - Left ventricle: The cavity size was normal. Systolic function was   vigorous. The estimated ejection fraction was in the range of 65%   to 70%. The study is not technically sufficient to allow   evaluation of LV diastolic function. - Mitral valve:  Calcified annulus. - Right ventricle: The cavity size was mildly dilated. - Right atrium: The atrium was mildly dilated. - Inferior vena cava: The vessel was dilated. The respirophasic   diameter changes were blunted (< 50%), consistent with elevated   central venous pressure.  Scheduled Meds: . ceFEPime (MAXIPIME) IV  2 g Intravenous Q24H  . diltiazem  240 mg Oral Daily  . ferrous sulfate  325 mg Oral Q breakfast  . furosemide  20 mg Intravenous BID  . insulin aspart  0-9 Units Subcutaneous TID WC  . ipratropium-albuterol  3 mL Nebulization Q4H  . lactobacillus  acidophilus  1 tablet Oral Daily  . metoprolol tartrate  25 mg Oral BID  . mometasone-formoterol  2 puff Inhalation BID  . pantoprazole  40 mg Oral Daily  . potassium chloride  20 mEq Oral Once  . rivaroxaban  20 mg Oral Q supper  . sodium chloride flush  3 mL Intravenous Q12H  . spironolactone  25 mg Oral Daily  . traZODone  50 mg Oral QHS  . vancomycin  1,250 mg Intravenous Q24H   Continuous Infusions: . diltiazem (CARDIZEM) infusion Stopped (01/31/16 2340)    LOS: 2 days   Merlene Laughter, DO [facility] Pager [phone]  If 7PM-7AM, please contact night-coverage [email or web address] Password [email or web address] 02/01/2016, 1:49 PM

## 2016-02-02 ENCOUNTER — Inpatient Hospital Stay (HOSPITAL_COMMUNITY): Payer: Medicare Other

## 2016-02-02 DIAGNOSIS — I480 Paroxysmal atrial fibrillation: Secondary | ICD-10-CM

## 2016-02-02 LAB — CBC WITH DIFFERENTIAL/PLATELET
Basophils Absolute: 0 10*3/uL (ref 0.0–0.1)
Basophils Relative: 0 %
EOS ABS: 0.5 10*3/uL (ref 0.0–0.7)
EOS PCT: 4 %
HCT: 34.4 % — ABNORMAL LOW (ref 36.0–46.0)
HEMOGLOBIN: 10 g/dL — AB (ref 12.0–15.0)
LYMPHS ABS: 2.3 10*3/uL (ref 0.7–4.0)
Lymphocytes Relative: 19 %
MCH: 26.8 pg (ref 26.0–34.0)
MCHC: 29.1 g/dL — AB (ref 30.0–36.0)
MCV: 92.2 fL (ref 78.0–100.0)
MONOS PCT: 8 %
Monocytes Absolute: 1 10*3/uL (ref 0.1–1.0)
Neutro Abs: 8.3 10*3/uL — ABNORMAL HIGH (ref 1.7–7.7)
Neutrophils Relative %: 69 %
PLATELETS: 278 10*3/uL (ref 150–400)
RBC: 3.73 MIL/uL — ABNORMAL LOW (ref 3.87–5.11)
RDW: 17.9 % — ABNORMAL HIGH (ref 11.5–15.5)
WBC: 12.2 10*3/uL — ABNORMAL HIGH (ref 4.0–10.5)

## 2016-02-02 LAB — GLUCOSE, CAPILLARY
GLUCOSE-CAPILLARY: 170 mg/dL — AB (ref 65–99)
GLUCOSE-CAPILLARY: 155 mg/dL — AB (ref 65–99)
GLUCOSE-CAPILLARY: 232 mg/dL — AB (ref 65–99)

## 2016-02-02 LAB — BLOOD GAS, ARTERIAL
Acid-Base Excess: 19.1 mmol/L — ABNORMAL HIGH (ref 0.0–2.0)
BICARBONATE: 45.2 mmol/L — AB (ref 20.0–28.0)
Drawn by: 236041
FIO2: 0.55
O2 Saturation: 96.7 %
PCO2 ART: 73.7 mmHg — AB (ref 32.0–48.0)
PH ART: 7.404 (ref 7.350–7.450)
PO2 ART: 86.6 mmHg (ref 83.0–108.0)
Patient temperature: 98.6

## 2016-02-02 LAB — BLOOD CULTURE ID PANEL (REFLEXED)
Acinetobacter baumannii: NOT DETECTED
CANDIDA ALBICANS: NOT DETECTED
CANDIDA KRUSEI: NOT DETECTED
CANDIDA TROPICALIS: NOT DETECTED
Candida glabrata: NOT DETECTED
Candida parapsilosis: NOT DETECTED
ENTEROBACTERIACEAE SPECIES: NOT DETECTED
Enterobacter cloacae complex: NOT DETECTED
Enterococcus species: NOT DETECTED
Escherichia coli: NOT DETECTED
HAEMOPHILUS INFLUENZAE: NOT DETECTED
KLEBSIELLA OXYTOCA: NOT DETECTED
KLEBSIELLA PNEUMONIAE: NOT DETECTED
Listeria monocytogenes: NOT DETECTED
METHICILLIN RESISTANCE: DETECTED — AB
Neisseria meningitidis: NOT DETECTED
Proteus species: NOT DETECTED
Pseudomonas aeruginosa: NOT DETECTED
STREPTOCOCCUS PYOGENES: NOT DETECTED
Serratia marcescens: NOT DETECTED
Staphylococcus aureus (BCID): NOT DETECTED
Staphylococcus species: DETECTED — AB
Streptococcus agalactiae: NOT DETECTED
Streptococcus pneumoniae: NOT DETECTED
Streptococcus species: NOT DETECTED

## 2016-02-02 LAB — BASIC METABOLIC PANEL
ANION GAP: 9 (ref 5–15)
BUN: 17 mg/dL (ref 6–20)
CHLORIDE: 79 mmol/L — AB (ref 101–111)
CO2: 49 mmol/L — AB (ref 22–32)
Calcium: 8.8 mg/dL — ABNORMAL LOW (ref 8.9–10.3)
Creatinine, Ser: 1.36 mg/dL — ABNORMAL HIGH (ref 0.44–1.00)
GFR calc non Af Amer: 38 mL/min — ABNORMAL LOW (ref >60)
GFR, EST AFRICAN AMERICAN: 45 mL/min — AB (ref >60)
Glucose, Bld: 142 mg/dL — ABNORMAL HIGH (ref 65–99)
Potassium: 3.4 mmol/L — ABNORMAL LOW (ref 3.5–5.1)
Sodium: 137 mmol/L (ref 135–145)

## 2016-02-02 LAB — PHOSPHORUS: Phosphorus: 4.6 mg/dL (ref 2.5–4.6)

## 2016-02-02 LAB — MAGNESIUM: MAGNESIUM: 1.5 mg/dL — AB (ref 1.7–2.4)

## 2016-02-02 LAB — BRAIN NATRIURETIC PEPTIDE: B Natriuretic Peptide: 246.9 pg/mL — ABNORMAL HIGH (ref 0.0–100.0)

## 2016-02-02 MED ORDER — DILTIAZEM HCL-DEXTROSE 100-5 MG/100ML-% IV SOLN (PREMIX): 5.0000 mg/h | INTRAVENOUS | Status: DC

## 2016-02-02 MED ORDER — MAGNESIUM OXIDE 400 (241.3 MG) MG PO TABS
400.0000 mg | ORAL_TABLET | Freq: Two times a day (BID) | ORAL | Status: DC
  Administered 2016-02-02 – 2016-02-08 (×13): 400 mg via ORAL
  Filled 2016-02-02 (×13): qty 1

## 2016-02-02 MED ORDER — MAGNESIUM SULFATE IN D5W 1-5 GM/100ML-% IV SOLN
1.0000 g | Freq: Once | INTRAVENOUS | Status: AC
  Administered 2016-02-02: 1 g via INTRAVENOUS
  Filled 2016-02-02: qty 100

## 2016-02-02 MED ORDER — FUROSEMIDE 20 MG PO TABS
20.0000 mg | ORAL_TABLET | Freq: Every day | ORAL | Status: DC
  Administered 2016-02-03 – 2016-02-04 (×2): 20 mg via ORAL
  Filled 2016-02-02 (×2): qty 1

## 2016-02-02 MED ORDER — IPRATROPIUM-ALBUTEROL 0.5-2.5 (3) MG/3ML IN SOLN
3.0000 mL | Freq: Three times a day (TID) | RESPIRATORY_TRACT | Status: DC
  Administered 2016-02-02 (×3): 3 mL via RESPIRATORY_TRACT
  Filled 2016-02-02 (×3): qty 3

## 2016-02-02 MED ORDER — IPRATROPIUM-ALBUTEROL 0.5-2.5 (3) MG/3ML IN SOLN
3.0000 mL | Freq: Four times a day (QID) | RESPIRATORY_TRACT | Status: DC
  Administered 2016-02-03 – 2016-02-05 (×10): 3 mL via RESPIRATORY_TRACT
  Filled 2016-02-02 (×10): qty 3

## 2016-02-02 MED ORDER — POTASSIUM CHLORIDE CRYS ER 20 MEQ PO TBCR
40.0000 meq | EXTENDED_RELEASE_TABLET | Freq: Two times a day (BID) | ORAL | Status: AC
  Administered 2016-02-02 (×2): 40 meq via ORAL
  Filled 2016-02-02 (×2): qty 2

## 2016-02-02 MED ORDER — SODIUM CHLORIDE 3 % IN NEBU
4.0000 mL | INHALATION_SOLUTION | Freq: Every day | RESPIRATORY_TRACT | Status: AC
  Administered 2016-02-02 – 2016-02-04 (×3): 4 mL via RESPIRATORY_TRACT
  Filled 2016-02-02 (×3): qty 4

## 2016-02-02 MED ORDER — FUROSEMIDE 10 MG/ML IJ SOLN
20.0000 mg | Freq: Once | INTRAMUSCULAR | Status: AC
  Administered 2016-02-02: 20 mg via INTRAVENOUS
  Filled 2016-02-02: qty 2

## 2016-02-02 NOTE — Progress Notes (Addendum)
  Pharmacy Antibiotic Note  [name] is a 70 y.o. female admitted on 01/30/2016 with pneumonia.  Pharmacy has been consulted for vancomycin and cefepime dosing. 1 of 2 blood cultures positive for methicillin-resistant staph species (not MRSA). WBC 12.2, afebrile, SCr stable. Per MD, continue vancomycin and cefepime for now, will reassess and consider narrowing tomorrow.  Plan: -Vancomycin 1250 mg IV q24h -Cefepime 2g IV q24h -Monitor clinical course and renal function -Narrow as appropriate -Consider vancomycin trough soon if continued   Height: 5\' 7"  (170.2 cm) Weight: 236 lb 1.8 oz (107.1 kg) IBW/kg (Calculated) : 61.6  Temp (24hrs), Avg:98.4 F (36.9 C), Min:98 F (36.7 C), Max:98.8 F (37.1 C)   Recent Labs Lab 01/30/16 1910 01/31/16 0448 02/01/16 0311 02/02/16 0306  WBC 14.9* 9.7 16.8* 12.2*  CREATININE 1.38* 1.40* 1.56* 1.36*    Estimated Creatinine Clearance: 48.5 mL/min (by C-G formula based on SCr of 1.36 mg/dL (H)).   Clearance appears stable  Allergies  Allergen Reactions  . Gold-Containing Drug Products Other (See Comments)    Blisters   . Tape Dermatitis    Antimicrobials this admission:  Vanc 10/11 > Cefepime 10/11 >>  Dose adjustments this admission:  N/A  Microbiology results:  10/13 BCx: BCID + staph spp w/ methicillin resistance 10/12 mrsa pcr: neg  Thank you for allowing pharmacy to be a part of this patient's care.  12/12, PharmD PGY-1 Pharmacy Resident Pager: [phone] 02/02/2016

## 2016-02-02 NOTE — Progress Notes (Signed)
PROGRESS NOTE    [name]  KWI:[id number] DOB: 03/29/1946 DOA: 01/30/2016 PCP: [name], MD   Brief Narrative:  [name] is a 70 y.o. female admitted 3 weeks ago for respiratory failure and encephalopathy with known hx of sleep apnea non compliant with CPAP, Chronic Afib on Anticoagulation, COPD, and DM, presents with worsening shortness of breath. Patient states she has chronic shortness of breath which had acutely worsened today with increasing lower extremity edema. and chest tightness. Early in the morning at her house, patient also had a fall and hit her head but did not lose consciousness after she lost her balance. CXR showed congestion and mild pleural effusion with possible infiltrates. Patient on exam, is mildly short of breath but able to complete sentences. JVD was elevated with bilateral lower extremity edema and mild wheezing. Since CXR was showing possible infiltrates patient was started on antibiotics for pneumonia . Patient also has clinical features consistent with CHF and had been given lasix 40mg  and placed on BiPAP. Patient was admitted for further managemt of acue respiratory failure possibly a combination of CHF and COPD with possible pneumonia. Dr. Jacinto Halim her primary Cardiologist was consulted for further recommendations. Patient converted to NSR and was transferred to SDU.   Assessment & Plan:   Principal Problem:   Acute on chronic respiratory failure with hypoxia and hypercapnia (HCC) Active Problems:   Diabetes mellitus type II, uncontrolled (HCC)   OSA (obstructive sleep apnea)   Iron deficiency anemia   Hypertension   COPD exacerbation (HCC)   Type 2 diabetes mellitus without complication, without long-term current use of insulin (HCC)   Acute respiratory failure (HCC)  Acute on Chronic Hypercapnic Hypoxic Respiratory Failure Requiring NIPPV likely 2/2 to supected Acute Exacerbation of CHF with likely concomitant COPD and ? HCAP poA -Patient weaned  from BiPAP to 6 L of [place] (baseline at Home is 3-4) -States she felt a little more Short of Breath today so Added Hypertonic Saline Nebs in addition to DuoNebs. Increased Frequency -Continue to Monitor O2 Sats and Maintain Sats >92%  Supected Acute Decompensation of Chronic Diastolic of CHF with EF of 60-65% -No documented Hx of CHF and last ECHO done 02/08/15 showed Left ventricle: The cavity size was normal. Systolic function was  normal. The estimated ejection fraction was in the range of 55% to 60%. Wall motion was normal; there were no regional wall  motion abnormalities.- Left atrium: The atrium was mildly dilated. -Repeat ECHOcardiogram done and showed Systolic Function was vigorous with Estimated EF of 65-70%. The study is not technically sufficient to allow evaluation of LV diastolic function -C/w Daily Weights, Strict I's and O's, 1200 mL Fluid Restriction and IV Diuresis with IV 20 mg BID;  -Appreciate Dr. Cardiology's Dr. Verl Dicker Input and Recc's -BNP was 466.8; -Cardiac Troponins Negative x3 -C/w Aldactone 25 mg po Daily -D/C'd IV Lasix and started Po Lasix 20 in AM  COPD and ? HCAP poA -CXR showed Ill-defined opacities in the lung bases could relate to atelectasis or infiltrate. Tiny bilateral effusions. -Patients WBC was 14.9 on Admission -> 9.7 -> 16.8  -> 12.2 -Will continue IV Abx of Cefepime and IV Vancomycin for now and switch to more targeted Therapy -C/w Duo Neb q4h and q2hprn and with Dulera -Repeat CXR showed Bibasilar Subsegmental Atelectasis.  -Repeat CBC in AM with Differential -Consider Adding Steroids  Atrial Fibrillation with RVR (hx cardioversion 09/2015) s/p Spontaneous Conversion to NSR - Chads2vasc score is 4.  -  C/w Telemetry - Patient is on Xarelto for Anticoagulation.  - Consulted Dr. [name] in Cardiology. Appreciate Input and Recc's - Continue Cardizem gtt, po Cardizem 240 mg and Metoprolol IV 5 mg q4hprn and Metoprolol 25 mg po BID .  -C/w Current  Recc's  Hypokalemia and Hypomagnesemia -Replete -Repeat K+ and Mag in AM  Blood Culture Contiamination? -1/2 Vials had Staph Species not MRSA -Repeat Blood Cultures -C/w IV Vanc for Now.   DM type 2 - Blood Sugar on BMP was 142 - Will be on Sensitive sliding scale coverage.    CKD stage III. - Patient's BUN/Cr was  17/1.36 - Repeat CMP in AM  Chronic Iron deficiency Anemia - On iron replacement with Ferrous Sulfate 325 mg po Daily.  - Hemoglobin went from 9.1/30.9 -> 8.5/28.4 -> 10.0/34.4 - Repeat CBC in AM with Diff  OSA.  -Non compliant with CPAP and did not wear last night.  -Patient was placed on BiPAP and transitioned to 5 L [place]. - May need BiPAP qHS   Mechanical Fall. - Patient has a history of brain aneurysm with coiling.  - CT head showed Left internal carotid artery terminus and basilar coil masses with extensive streak artifact obscuring the brain at those levels. Within the visualized brain no acute abnormality is identified. Grossly stable moderate chronic microvascular ischemic changes given differences in technique.  Right Sided Rib Pain - Improved Pain control with Norco.  - Likely 2/2 to Mechanical Fall - C/w Norco 1 tablet q6hprn - Continue to Monitor  DVT prophylaxis:  Anticoagulated with Xarelto Code Status: FULL Family Communication: No Family At Bedside Disposition Plan: Likely SNF vs. Home with Home Health  Consultants:  Cardiology Dr. [name]  Procedures: None  Antimicrobials: IV Cefepime and IV Vancomycin  Subjective: Patient was seen and examined at bedside this AM and she she was not doing as good today. States she was not "feeling good at all" and felt a little light headed and dizzy. Patient stated she was also having cramps. States she was in some pain too. No CP but SOB got a little worse. No other complaints or concerns.   Objective: Vitals:   02/02/16 1915 02/02/16 2059 02/02/16 2100 02/02/16 2308  BP: (!) 133/54   (!) 125/58   Pulse: 85   84  Resp: 20   (!) 22  Temp: 97.9 F (36.6 C)   98.2 F (36.8 C)  TempSrc: Axillary   Oral  SpO2: 90% (!) 88% 91% 92%  Weight:      Height:        Intake/Output Summary (Last 24 hours) at 02/02/16 2326 Last data filed at 02/02/16 1947  Gross per 24 hour  Intake             1020 ml  Output             2925 ml  Net            -1905 ml   Filed Weights   01/31/16 0500 02/01/16 0410 02/02/16 0248  Weight: 108.8 kg (239 lb 13.8 oz) 108.5 kg (239 lb 3.2 oz) 107.1 kg (236 lb 1.8 oz)    Examination: Physical Exam:  Constitutional: WN/WD, NAD Eyes:  lids and conjunctivae normal, sclerae anicteric  ENMT: External Ears, Nose appear normal. Grossly normal hearing.  Neck: Appears normal, supple, no cervical masses, Respiratory: Diminished bilaterally with crackles. No appreciable rhonchi. Normal respiratory effort with slight tachypenia. Dyspenic on conversation.  Cardiovascular: RRR; No murmurs /  rubs / gallops. S1 and S2 auscultated. Mild edema. Abdomen: Soft, non-tender, non-distended. No masses palpated. No appreciable hepatosplenomegaly. Bowel sounds positive x4.  GU: Deferred. Musculoskeletal: No clubbing / cyanosis of digits/nails. No joint deformity upper and lower extremities. No contractures Skin: No rashes, lesions, ulcers on limited skin exam. No induration; Warm and dry.  Neurologic: CN 2-12 grossly intact with no focal deficits. Sensation intact in all 4 Extremities. Romberg sign cerebellar reflexes not assessed.  Psychiatric: Normal judgment and insight. Alert and oriented x 3. Anxious mood and appropriate affect.   Data Reviewed: I have personally reviewed following labs and imaging studies  CBC:  Recent Labs Lab 01/30/16 1910 01/31/16 0448 02/01/16 0311 02/02/16 0306  WBC 14.9* 9.7 16.8* 12.2*  NEUTROABS  --   --  13.7* 8.3*  HGB 9.6* 9.1* 8.5* 10.0*  HCT 34.2* 30.9* 28.4* 34.4*  MCV 95.0 90.9 89.9 92.2  PLT 272 244 281 278   Basic Metabolic  Panel:  Recent Labs Lab 01/30/16 1910 01/31/16 0140 01/31/16 0448 02/01/16 0311 02/02/16 0306  NA 138  --  135 134* 137  K 3.4*  --  3.7 4.3 3.4*  CL 85*  --  84* 81* 79*  CO2 41*  --  41* 46* 49*  GLUCOSE 301*  --  349* 190* 142*  BUN 13  --  18 20 17   CREATININE 1.38*  --  1.40* 1.56* 1.36*  CALCIUM 9.8  --  9.0 8.5* 8.8*  MG  --  1.9  --  1.6* 1.5*  PHOS  --   --   --  4.1 4.6   GFR: Estimated Creatinine Clearance: 48.5 mL/min (by C-G formula based on SCr of 1.36 mg/dL (H)). Liver Function Tests:  Recent Labs Lab 02/01/16 0311  AST 17  ALT 13*  ALKPHOS 36*  BILITOT 0.4  PROT 5.6*  ALBUMIN 2.8*   No results for input(s): LIPASE, AMYLASE in the last 168 hours. No results for input(s): AMMONIA in the last 168 hours. Coagulation Profile: No results for input(s): INR, PROTIME in the last 168 hours. Cardiac Enzymes:  Recent Labs Lab 01/31/16 0140 01/31/16 0448 01/31/16 1103  TROPONINI 0.03* 0.03* <0.03   BNP (last 3 results) No results for input(s): PROBNP in the last 8760 hours. HbA1C: No results for input(s): HGBA1C in the last 72 hours. CBG:  Recent Labs Lab 02/01/16 1603 02/01/16 2148 02/02/16 0731 02/02/16 1715 02/02/16 2215  GLUCAP 204* 223* 170* 155* 232*   Lipid Profile: No results for input(s): CHOL, HDL, LDLCALC, TRIG, CHOLHDL, LDLDIRECT in the last 72 hours. Thyroid Function Tests:  Recent Labs  01/31/16 0140  TSH 1.990   Anemia Panel: No results for input(s): VITAMINB12, FOLATE, FERRITIN, TIBC, IRON, RETICCTPCT in the last 72 hours. Sepsis Labs: No results for input(s): PROCALCITON, LATICACIDVEN in the last 168 hours.  Recent Results (from the past 240 hour(s))  MRSA PCR Screening     Status: None   Collection Time: 01/31/16  6:28 AM  Result Value Ref Range Status   MRSA by PCR NEGATIVE NEGATIVE Final    Comment:        The GeneXpert MRSA Assay (FDA approved for NASAL specimens only), is one component of a comprehensive  MRSA colonization surveillance program. It is not intended to diagnose MRSA infection nor to guide or monitor treatment for MRSA infections.   Culture, blood (Routine X 2) w Reflex to ID Panel     Status: None (Preliminary result)   Collection Time:  02/01/16  7:41 AM  Result Value Ref Range Status   Specimen Description BLOOD RIGHT ANTECUBITAL  Final   Special Requests BOTTLES DRAWN AEROBIC AND ANAEROBIC 5CC  Final   Culture NO GROWTH 1 DAY  Final   Report Status PENDING  Incomplete  Culture, blood (Routine X 2) w Reflex to ID Panel     Status: None (Preliminary result)   Collection Time: 02/01/16  7:43 AM  Result Value Ref Range Status   Specimen Description BLOOD LEFT ARM  Final   Special Requests IN PEDIATRIC BOTTLE 3CC  Final   Culture  Setup Time   Final    GRAM POSITIVE COCCI IN CLUSTERS AEROBIC BOTTLE ONLY Organism ID to follow CRITICAL RESULT CALLED TO, READ BACK BY AND VERIFIED WITH: A. Masters Pharm.D. 9:05 02/02/16  ([email or web address])    Culture TOO YOUNG TO READ  Final   Report Status PENDING  Incomplete  Blood Culture ID Panel (Reflexed)     Status: Abnormal   Collection Time: 02/01/16  7:43 AM  Result Value Ref Range Status   Enterococcus species NOT DETECTED NOT DETECTED Final   Listeria monocytogenes NOT DETECTED NOT DETECTED Final   Staphylococcus species DETECTED (A) NOT DETECTED Final    Comment: CRITICAL RESULT CALLED TO, READ BACK BY AND VERIFIED WITH: A. Masters Pharm.D. 9:05 02/02/16 ([email or web address])    Staphylococcus aureus NOT DETECTED NOT DETECTED Final   Methicillin resistance DETECTED (A) NOT DETECTED Final    Comment: CRITICAL RESULT CALLED TO, READ BACK BY AND VERIFIED WITH: A. Masters Pharm.D. 9:05 02/02/16 ([email or web address])    Streptococcus species NOT DETECTED NOT DETECTED Final   Streptococcus agalactiae NOT DETECTED NOT DETECTED Final   Streptococcus pneumoniae NOT DETECTED NOT DETECTED Final   Streptococcus pyogenes NOT DETECTED NOT DETECTED Final    Acinetobacter baumannii NOT DETECTED NOT DETECTED Final   Enterobacteriaceae species NOT DETECTED NOT DETECTED Final   Enterobacter cloacae complex NOT DETECTED NOT DETECTED Final   Escherichia coli NOT DETECTED NOT DETECTED Final   Klebsiella oxytoca NOT DETECTED NOT DETECTED Final   Klebsiella pneumoniae NOT DETECTED NOT DETECTED Final   Proteus species NOT DETECTED NOT DETECTED Final   Serratia marcescens NOT DETECTED NOT DETECTED Final   Haemophilus influenzae NOT DETECTED NOT DETECTED Final   Neisseria meningitidis NOT DETECTED NOT DETECTED Final   Pseudomonas aeruginosa NOT DETECTED NOT DETECTED Final   Candida albicans NOT DETECTED NOT DETECTED Final   Candida glabrata NOT DETECTED NOT DETECTED Final   Candida krusei NOT DETECTED NOT DETECTED Final   Candida parapsilosis NOT DETECTED NOT DETECTED Final   Candida tropicalis NOT DETECTED NOT DETECTED Final    Radiology Studies: Dg Chest 2 View  Result Date: 02/01/2016 CLINICAL DATA:  70 year old female with history of congestive heart failure. EXAM: CHEST  2 VIEW COMPARISON:  Chest x-ray 01/30/2016. FINDINGS: The status changes are noted throughout the lungs bilaterally. There is cephalization of the pulmonary vasculature and slight indistinctness of the interstitial markings suggestive of mild pulmonary edema. Small bilateral pleural effusions. Bibasilar subsegmental atelectasis. Heart size is mildly enlarged. Upper mediastinal contours are distorted by patient's rotation the left. Atherosclerosis in the thoracic aorta. IMPRESSION: 1. The appearance of the chest suggests mild congestive heart failure. 2. Bibasilar subsegmental atelectasis. 3. Emphysema. 4. Aortic atherosclerosis. Electronically Signed   By: Trudie Reed M.D.   On: 02/01/2016 07:01   ECHOCARDIOGRAM:  Study Conclusions  - Left ventricle: The cavity size was normal. Systolic function was   vigorous. The estimated  ejection fraction was in the range of 65%   to  70%. The study is not technically sufficient to allow   evaluation of LV diastolic function. - Mitral valve: Calcified annulus. - Right ventricle: The cavity size was mildly dilated. - Right atrium: The atrium was mildly dilated. - Inferior vena cava: The vessel was dilated. The respirophasic   diameter changes were blunted (< 50%), consistent with elevated   central venous pressure.  Scheduled Meds: . ceFEPime (MAXIPIME) IV  2 g Intravenous Q24H  . diltiazem  240 mg Oral Daily  . ferrous sulfate  325 mg Oral Q breakfast  . [START ON 02/03/2016] furosemide  20 mg Oral Daily  . insulin aspart  0-9 Units Subcutaneous TID WC  . ipratropium-albuterol  3 mL Nebulization TID  . lactobacillus acidophilus  1 tablet Oral Daily  . magnesium oxide  400 mg Oral BID  . metoprolol tartrate  25 mg Oral BID  . mometasone-formoterol  2 puff Inhalation BID  . pantoprazole  40 mg Oral Daily  . potassium chloride  20 mEq Oral Once  . rivaroxaban  20 mg Oral Q supper  . sodium chloride flush  3 mL Intravenous Q12H  . sodium chloride HYPERTONIC  4 mL Nebulization Daily  . spironolactone  25 mg Oral Daily  . traZODone  50 mg Oral QHS  . vancomycin  1,250 mg Intravenous Q24H   Continuous Infusions:    LOS: 3 days   Merlene Laughter, DO [facility] Pager [phone]  If 7PM-7AM, please contact night-coverage [email or web address] Password [email or web address] 02/02/2016, 11:26 PM

## 2016-02-02 NOTE — Progress Notes (Signed)
Please note this note was enterenterd from encounter from yesterday at 9 am.  Subjective:  Feels much better. No further palpitations, dyspnea has improved.  Objective:  Vital Signs in the last 24 hours: Temp:  [97.5 F (36.4 C)-98.8 F (37.1 C)] 98.1 F (36.7 C) (10/14 0248) Pulse Rate:  [69-112] 81 (10/14 0336) Resp:  [19-27] 26 (10/14 0336) BP: (134-146)/(46-75) 137/75 (10/14 0248) SpO2:  [68 %-96 %] 92 % (10/14 0336) Weight:  [107.1 kg (236 lb 1.8 oz)] 107.1 kg (236 lb 1.8 oz) (10/14 0248)  Intake/Output from previous day: 10/13 0701 - 10/14 0700 In: 1893 [P.O.:1590; I.V.:3; IV Piggyback:300] Out: 3750 [Urine:3750]   Body mass index is 36.98 kg/m.  Physical Exam:  General appearance: alert, appears stated age, no distress and moderately obese Eyes: negative findings: lids and lashes normal Neck: no adenopathy, no carotid bruit, supple, symmetrical, trachea midline, thyroid not enlarged, symmetric, no tenderness/mass/nodules and short neck Neck: JVP - normal, carotids 2+= without bruits Resp: clear to auscultation bilaterally Chest wall: no tenderness Cardio: regular rate and rhythm, S1, S2 normal, no murmur, click, rub or gallop GI: soft, non-tender; bowel sounds normal; no masses,  no organomegaly and obese Extremities: edema trace    Lab Results: BMP  Recent Labs  01/31/16 0448 02/01/16 0311 02/02/16 0306  NA 135 134* 137  K 3.7 4.3 3.4*  CL 84* 81* 79*  CO2 41* 46* 49*  GLUCOSE 349* 190* 142*  BUN 18 20 17   CREATININE 1.40* 1.56* 1.36*  CALCIUM 9.0 8.5* 8.8*  GFRNONAA 37* 33* 38*  GFRAA 43* 38* 45*    CBC  Recent Labs Lab 02/02/16 0306  WBC 12.2*  RBC 3.73*  HGB 10.0*  HCT 34.4*  PLT 278  MCV 92.2  MCH 26.8  MCHC 29.1*  RDW 17.9*  LYMPHSABS 2.3  MONOABS 1.0  EOSABS 0.5  BASOSABS 0.0    HEMOGLOBIN A1C Lab Results  Component Value Date   HGBA1C 7.5 (H) 01/06/2016   MPG 169 01/06/2016    Cardiac Panel (last 3 results)  Recent  Labs  09/25/15 0747  01/31/16 0140 01/31/16 0448 01/31/16 1103  CKTOTAL 168  --   --   --   --   TROPONINI  --   < > 0.03* 0.03* <0.03  < > = values in this interval not displayed.   Recent Labs  10/01/15 1331 01/31/16 0140  TSH 0.613 1.990    Recent Labs  10/02/15 0306 01/06/16 0538 02/01/16 0311  PROT 5.8* 7.0 5.6*  ALBUMIN 2.7* 2.9* 2.8*  AST 15 15 17   ALT 21 13* 13*  ALKPHOS 39 62 36*  BILITOT 0.4 0.4 0.4  BILIDIR <0.1*  --   --   IBILI NOT CALCULATED  --   --     Imaging: Imaging results have been reviewed  Cardiac Studies: EKG 01/31/16: Atrial fibrillation with RVR. No evidence of ischemia. This is in spite of RVR of 1 46 bpm.  Echo: Hyperdynamic LV, no significant valvular abnormality, poor echo window. Right ventricle appears mildly dilated. IVC is dilated with reduced respiratory response suggests elevated right heart pressure.  Tele: 02/01/16: NSR   Scheduled Meds: . ceFEPime (MAXIPIME) IV  2 g Intravenous Q24H  . diltiazem  240 mg Oral Daily  . ferrous sulfate  325 mg Oral Q breakfast  . furosemide  20 mg Intravenous BID  . insulin aspart  0-9 Units Subcutaneous TID WC  . ipratropium-albuterol  3 mL Nebulization TID  .  lactobacillus acidophilus  1 tablet Oral Daily  . metoprolol tartrate  25 mg Oral BID  . mometasone-formoterol  2 puff Inhalation BID  . pantoprazole  40 mg Oral Daily  . potassium chloride  20 mEq Oral Once  . rivaroxaban  20 mg Oral Q supper  . sodium chloride flush  3 mL Intravenous Q12H  . spironolactone  25 mg Oral Daily  . traZODone  50 mg Oral QHS  . vancomycin  1,250 mg Intravenous Q24H   Continuous Infusions: . diltiazem (CARDIZEM) infusion Stopped (01/31/16 2340)   PRN Meds:.acetaminophen **OR** acetaminophen, HYDROcodone-acetaminophen, ipratropium-albuterol, LORazepam, metoprolol, ondansetron **OR** ondansetron (ZOFRAN) IV  Assessment/Plan:   1. Paroxysmal atrial fibrillation now in NSR. CHA2DS2-VASCScore: Risk  Score 4(without stroke as it was related to aneurysm), Yearly risk of stroke 4%. 2. Hypertension 3. Hyperlipidemia 4. Diabetes mellitus type 2 uncontrolled without use of insulin with stage 3 CKD 5. Morbid obesity 6. COPD with acute exacerbation, history of 40-pack-year history of smoking, quit 2006 7. History of cerebral aneurysm S/P coiling with residual aneurysm present. 8. Chronic iron deficiency anemia.  ReC:  Patient now in sinus rhythm. Continue long term anticoagulation and present meds. Please call if questions.       [name], M.D. 02/02/2016, 5:50 AM [facility] Pager: [phone] Office: [phone] If no answer: [phone]

## 2016-02-02 NOTE — Progress Notes (Signed)
Patient continues to refuse CPAP.  States that she cannot tolerate.

## 2016-02-02 NOTE — Progress Notes (Addendum)
PHARMACY - PHYSICIAN COMMUNICATION CRITICAL VALUE ALERT - BLOOD CULTURE IDENTIFICATION (BCID)  Results for orders placed or performed during the hospital encounter of 01/30/16  Blood Culture ID Panel (Reflexed) (Collected: 02/01/2016  7:43 AM)  Result Value Ref Range   Enterococcus species NOT DETECTED NOT DETECTED   Listeria monocytogenes NOT DETECTED NOT DETECTED   Staphylococcus species DETECTED (A) NOT DETECTED   Staphylococcus aureus NOT DETECTED NOT DETECTED   Methicillin resistance DETECTED (A) NOT DETECTED   Streptococcus species NOT DETECTED NOT DETECTED   Streptococcus agalactiae NOT DETECTED NOT DETECTED   Streptococcus pneumoniae NOT DETECTED NOT DETECTED   Streptococcus pyogenes NOT DETECTED NOT DETECTED   Acinetobacter baumannii NOT DETECTED NOT DETECTED   Enterobacteriaceae species NOT DETECTED NOT DETECTED   Enterobacter cloacae complex NOT DETECTED NOT DETECTED   Escherichia coli NOT DETECTED NOT DETECTED   Klebsiella oxytoca NOT DETECTED NOT DETECTED   Klebsiella pneumoniae NOT DETECTED NOT DETECTED   Proteus species NOT DETECTED NOT DETECTED   Serratia marcescens NOT DETECTED NOT DETECTED   Haemophilus influenzae NOT DETECTED NOT DETECTED   Neisseria meningitidis NOT DETECTED NOT DETECTED   Pseudomonas aeruginosa NOT DETECTED NOT DETECTED   Candida albicans NOT DETECTED NOT DETECTED   Candida glabrata NOT DETECTED NOT DETECTED   Candida krusei NOT DETECTED NOT DETECTED   Candida parapsilosis NOT DETECTED NOT DETECTED   Candida tropicalis NOT DETECTED NOT DETECTED    BCID positive for methicillin-resistant staph species - not MRSA  Changes to prescribed antibiotics required: None Continue vancomycin and cefepime     Agapito Games, PharmD, BCPS Clinical Pharmacist 02/02/2016 9:13 AM

## 2016-02-03 DIAGNOSIS — I509 Heart failure, unspecified: Secondary | ICD-10-CM

## 2016-02-03 DIAGNOSIS — I5040 Unspecified combined systolic (congestive) and diastolic (congestive) heart failure: Secondary | ICD-10-CM

## 2016-02-03 DIAGNOSIS — W19XXXA Unspecified fall, initial encounter: Secondary | ICD-10-CM

## 2016-02-03 DIAGNOSIS — R0602 Shortness of breath: Secondary | ICD-10-CM

## 2016-02-03 DIAGNOSIS — Z515 Encounter for palliative care: Secondary | ICD-10-CM

## 2016-02-03 LAB — BLOOD GAS, ARTERIAL
ACID-BASE EXCESS: 23.1 mmol/L — AB (ref 0.0–2.0)
BICARBONATE: 50.1 mmol/L — AB (ref 20.0–28.0)
DELIVERY SYSTEMS: POSITIVE
DRAWN BY: 244851
EXPIRATORY PAP: 8
FIO2: 50
INSPIRATORY PAP: 20
MODE: POSITIVE
O2 SAT: 88.2 %
PH ART: 7.343 — AB (ref 7.350–7.450)
Patient temperature: 98.6
RATE: 14 resp/min
pCO2 arterial: 94.9 mmHg (ref 32.0–48.0)
pO2, Arterial: 59.9 mmHg — ABNORMAL LOW (ref 83.0–108.0)

## 2016-02-03 LAB — CBC WITH DIFFERENTIAL/PLATELET
BASOS PCT: 0 %
Basophils Absolute: 0 10*3/uL (ref 0.0–0.1)
EOS ABS: 0.5 10*3/uL (ref 0.0–0.7)
EOS PCT: 5 %
HCT: 35 % — ABNORMAL LOW (ref 36.0–46.0)
Hemoglobin: 10.1 g/dL — ABNORMAL LOW (ref 12.0–15.0)
LYMPHS ABS: 1.9 10*3/uL (ref 0.7–4.0)
Lymphocytes Relative: 19 %
MCH: 26.9 pg (ref 26.0–34.0)
MCHC: 28.9 g/dL — AB (ref 30.0–36.0)
MCV: 93.3 fL (ref 78.0–100.0)
MONOS PCT: 9 %
Monocytes Absolute: 0.9 10*3/uL (ref 0.1–1.0)
Neutro Abs: 6.8 10*3/uL (ref 1.7–7.7)
Neutrophils Relative %: 67 %
PLATELETS: 256 10*3/uL (ref 150–400)
RBC: 3.75 MIL/uL — ABNORMAL LOW (ref 3.87–5.11)
RDW: 17.2 % — AB (ref 11.5–15.5)
WBC: 10 10*3/uL (ref 4.0–10.5)

## 2016-02-03 LAB — GLUCOSE, CAPILLARY
GLUCOSE-CAPILLARY: 146 mg/dL — AB (ref 65–99)
GLUCOSE-CAPILLARY: 151 mg/dL — AB (ref 65–99)
GLUCOSE-CAPILLARY: 201 mg/dL — AB (ref 65–99)
Glucose-Capillary: 260 mg/dL — ABNORMAL HIGH (ref 65–99)

## 2016-02-03 LAB — COMPREHENSIVE METABOLIC PANEL
ALK PHOS: 39 U/L (ref 38–126)
ALT: 13 U/L — AB (ref 14–54)
AST: 12 U/L — AB (ref 15–41)
Albumin: 2.7 g/dL — ABNORMAL LOW (ref 3.5–5.0)
Anion gap: 12 (ref 5–15)
BUN: 18 mg/dL (ref 6–20)
CALCIUM: 9 mg/dL (ref 8.9–10.3)
CHLORIDE: 82 mmol/L — AB (ref 101–111)
CO2: 44 mmol/L — AB (ref 22–32)
CREATININE: 1.29 mg/dL — AB (ref 0.44–1.00)
GFR, EST AFRICAN AMERICAN: 47 mL/min — AB (ref >60)
GFR, EST NON AFRICAN AMERICAN: 41 mL/min — AB (ref >60)
Glucose, Bld: 171 mg/dL — ABNORMAL HIGH (ref 65–99)
Potassium: 4 mmol/L (ref 3.5–5.1)
SODIUM: 138 mmol/L (ref 135–145)
Total Bilirubin: 0.7 mg/dL (ref 0.3–1.2)
Total Protein: 5.8 g/dL — ABNORMAL LOW (ref 6.5–8.1)

## 2016-02-03 LAB — PHOSPHORUS: PHOSPHORUS: 4.5 mg/dL (ref 2.5–4.6)

## 2016-02-03 LAB — BRAIN NATRIURETIC PEPTIDE: B NATRIURETIC PEPTIDE 5: 66.3 pg/mL (ref 0.0–100.0)

## 2016-02-03 LAB — MAGNESIUM: MAGNESIUM: 1.7 mg/dL (ref 1.7–2.4)

## 2016-02-03 MED ORDER — ZOLPIDEM TARTRATE 5 MG PO TABS: 5.0000 mg | ORAL_TABLET | Freq: Once | ORAL | Status: DC

## 2016-02-03 MED ORDER — FUROSEMIDE 10 MG/ML IJ SOLN
40.0000 mg | Freq: Once | INTRAMUSCULAR | Status: AC
  Administered 2016-02-03: 40 mg via INTRAVENOUS

## 2016-02-03 MED ORDER — FUROSEMIDE 10 MG/ML IJ SOLN
INTRAMUSCULAR | Status: AC
  Filled 2016-02-03: qty 4

## 2016-02-03 NOTE — Progress Notes (Signed)
PROGRESS NOTE    [name]  HRC:[id number] DOB: 01/17/1946 DOA: 01/30/2016 PCP: [name], MD   Brief Narrative:  [name] is a 70 y.o. female admitted 3 weeks ago for respiratory failure and encephalopathy with known hx of sleep apnea non compliant with CPAP, Chronic Afib on Anticoagulation, COPD, and DM, presents with worsening shortness of breath. Patient states she has chronic shortness of breath which had acutely worsened today with increasing lower extremity edema. and chest tightness. Early in the morning at her house, patient also had a fall and hit her head but did not lose consciousness after she lost her balance. CXR showed congestion and mild pleural effusion with possible infiltrates. Patient on exam, is mildly short of breath but able to complete sentences. JVD was elevated with bilateral lower extremity edema and mild wheezing. Since CXR was showing possible infiltrates patient was started on antibiotics for pneumonia . Patient also has clinical features consistent with CHF and had been given lasix 40mg  and placed on BiPAP. Patient was admitted for further managemt of acue respiratory failure possibly a combination of CHF and COPD with possible pneumonia. Dr. her primary Cardiologist was consulted for further recommendations. Patient converted to NSR and was transferred to SDU. Overnight the patient went into respiratory Distress and eLink Physician was notified and she was placed on BiPAP. Pulmonary came to talk with her today and she decided "no more machines" and stated she did not want the BiPAP or be Intubated. Palliative Care was consulted for additional recommendations.   Assessment & Plan:   Principal Problem:   Acute on chronic respiratory failure with hypoxia and hypercapnia (HCC) Active Problems:   Diabetes mellitus type II, uncontrolled (HCC)   COPD (chronic obstructive pulmonary disease) (HCC)   OSA (obstructive sleep apnea)   Iron deficiency anemia   Acute on chronic respiratory failure with hypoxia (HCC)   Hypertension   COPD exacerbation (HCC)   Type 2 diabetes mellitus without complication, without long-term current use of insulin (HCC)   Acute respiratory failure (HCC)   CHF (congestive heart failure) (HCC)   Palliative care encounter  Acute on Chronic Hypercapnic Hypoxic Respiratory Failure likley 2/2 Acute Exacerbation of CHF with likely concomitant COPD and ? HCAP poA -Pulmonology Consulted overnight because of Respiratory Distress and was placed on BiPAP 20/8. -Discussed plan with Patient and she stated she did not want any more BiPAP and did not want to be Intubated. Code Status Changed to DNI.  -Patient wore Venti Mask and O2 via [place] throughout the entire day. -Continue to Monitor O2 Sats; Pulmonary ok with Sat's in 80's -Palliative Care Consulted for additional Recc's -Family meeting tomorrow with Palliative Care.   Supected Acute Decompensation of Chronic Diastolic of CHF with EF of 60-65% -No documented Hx of CHF and last ECHO done 02/08/15 showed Left ventricle: The cavity size was normal. Systolic function was  normal. The estimated ejection fraction was in the range of 55% to 60%. Wall motion was normal; there were no regional wall  motion abnormalities.- Left atrium: The atrium was mildly dilated. -Repeat ECHOcardiogram done and showed Systolic Function was vigorous with Estimated EF of 65-70%. The study is not technically sufficient to allow evaluation of LV diastolic function -C/w Daily Weights, Strict I's and O's, 1200 mL Fluid Restriction and IV Diuresis with IV 20 mg BID;  -Appreciate Dr. Cardiology's Dr. 02/10/15 Input and Recc's -BNP was 466.8; Repeat was 66.3 -Cardiac Troponins Negative x3 -C/w Aldactone 25 mg po  Daily -Was given Lasix overnight because of Respiratory Distress and Foley Catheter was placed.  COPD and ? HCAP poA -CXR showed Ill-defined opacities in the lung bases could relate to atelectasis or  infiltrate. Tiny bilateral effusions. -Patients WBC was 14.9 on Admission -> 9.7 -> 16.8  -> 12.2 -> 10 -Will continue IV Abx of Cefepime and IV Vancomycin for now and switch to more targeted Therapy once goals of Care have been decided.  -C/w Duo Neb q4h and q2hprn and with Dulera -Repeat CXR showed Bibasilar Subsegmental Atelectasis.  -Repeat CBC in AM with Differential -Consider Adding Steroids  Atrial Fibrillation with RVR (hx cardioversion 09/2015) s/p Spontaneous Conversion to NSR - Chads2vasc score is 4.  - C/w Telemetry - Patient is on Xarelto for Anticoagulation.  - Consulted Dr. [name] in Cardiology. Appreciate Input and Recc's - Continue Cardizem gtt, po Cardizem 240 mg and Metoprolol IV 5 mg q4hprn and Metoprolol 25 mg po BID .  - C/w Current Recc's  Hypokalemia and Hypomagnesemia -Repleted  Blood Culture Contiamination? -1/2 Vials had Methicillin Resistant Staph Species not MRSA -Repeat Blood Cultures -C/w IV Vanc for Now.   DM type 2 - Blood Sugar on BMP was 171 - Will be on Sensitive sliding scale coverage.    CKD stage III. - Patient's BUN/Cr was  17/1.36 -> 18/1.29 - Repeat CMP in AM  Chronic Iron deficiency Anemia - On iron replacement with Ferrous Sulfate 325 mg po Daily.  - Hemoglobin went from 9.1/30.9 -> 8.5/28.4 -> 10.0/34.4 - Repeat CBC in AM with Diff  OSA.  -Non compliant with CPAP and did not wear last night.  -Discuss with Palliative -Possible Sleep Study as an out Patient  Mechanical Fall. - Patient has a history of brain aneurysm with coiling.  - CT head showed Left internal carotid artery terminus and basilar coil masses with extensive streak artifact obscuring the brain at those levels. Within the visualized brain no acute abnormality is identified. Grossly stable moderate chronic microvascular ischemic changes given differences in technique.  Right Sided Rib Pain - Improved Pain control with Norco.  - Likely 2/2 to Mechanical  Fall - C/w Norco 1 tablet q6hprn - Continue to Monitor  DVT prophylaxis:  Anticoagulated with Xarelto Code Status: FULL Family Communication: No Family At Bedside Disposition Plan: Likely SNF vs. Home with Home Health  Consultants:  Cardiology Dr. [name] Pulmonology Dr. Sherene Sires  Procedures: None  Antimicrobials: IV Cefepime and IV Vancomycin  Subjective: Patient was seen and examined at bedside this AM and she she was not doing good and had a rough night. Had to be placed on BiPAP and this Morning had a conversation with Dr. Sherene Sires about intubation and BiPAP and states she did not want any of it. She stated clearly that she "didn't want to die" but would not pursue aggressive measures. Discussed options and wanted to hear what Palliative Care had to offer. Palliative Care team came to discuss and will have family meeting in Am.   Objective: Vitals:   02/03/16 0600 02/03/16 0704 02/03/16 0815 02/03/16 1100  BP:  131/62  (!) 133/54  Pulse: 83 94  81  Resp: (!) 23 (!) 21  (!) 26  Temp:  97.5 F (36.4 C)  99.2 F (37.3 C)  TempSrc:  Axillary  Oral  SpO2: (!) 85% 90% (!) 88% (!) 86%  Weight:      Height:        Intake/Output Summary (Last 24 hours) at 02/03/16 1445  Last data filed at 02/03/16 1400  Gross per 24 hour  Intake           903.33 ml  Output             2650 ml  Net         -1746.67 ml   Filed Weights   02/01/16 0410 02/02/16 0248 02/03/16 0300  Weight: 108.5 kg (239 lb 3.2 oz) 107.1 kg (236 lb 1.8 oz) 108.1 kg (238 lb 5.1 oz)    Examination: Physical Exam:  Constitutional: WN/WD, Moderate Respiratory Distress Eyes:  lids and conjunctivae normal, sclerae anicteric  ENMT: External Ears, Nose appear normal. Grossly normal hearing.  Neck: Appears normal, supple, no cervical masses, Respiratory: Diminished bilaterally with crackles. No appreciable rhonchi. Increased respiratory effort with slight tachypenia. Dyspenic on conversation.  Cardiovascular: RRR; No  murmurs / rubs / gallops. S1 and S2 auscultated. Mild edema. Abdomen: Soft, non-tender, non-distended. No masses palpated. No appreciable hepatosplenomegaly. Bowel sounds positive x4.  GU: Deferred. Musculoskeletal: No clubbing / cyanosis of digits/nails. No joint deformity upper and lower extremities. No contractures Skin: No rashes, lesions, ulcers on limited skin exam. No induration; Warm and dry.  Neurologic: CN 2-12 grossly intact with no focal deficits. Sensation intact in all 4 Extremities. Romberg sign cerebellar reflexes not assessed.  Psychiatric: Normal judgment and insight. Alert and oriented x 3. Anxious and depressed mood and flat affect.   Data Reviewed: I have personally reviewed following labs and imaging studies  CBC:  Recent Labs Lab 01/30/16 1910 01/31/16 0448 02/01/16 0311 02/02/16 0306 02/03/16 0409  WBC 14.9* 9.7 16.8* 12.2* 10.0  NEUTROABS  --   --  13.7* 8.3* 6.8  HGB 9.6* 9.1* 8.5* 10.0* 10.1*  HCT 34.2* 30.9* 28.4* 34.4* 35.0*  MCV 95.0 90.9 89.9 92.2 93.3  PLT 272 244 281 278 256   Basic Metabolic Panel:  Recent Labs Lab 01/30/16 1910 01/31/16 0140 01/31/16 0448 02/01/16 0311 02/02/16 0306 02/03/16 0409  NA 138  --  135 134* 137 138  K 3.4*  --  3.7 4.3 3.4* 4.0  CL 85*  --  84* 81* 79* 82*  CO2 41*  --  41* 46* 49* 44*  GLUCOSE 301*  --  349* 190* 142* 171*  BUN 13  --  18 20 17 18   CREATININE 1.38*  --  1.40* 1.56* 1.36* 1.29*  CALCIUM 9.8  --  9.0 8.5* 8.8* 9.0  MG  --  1.9  --  1.6* 1.5* 1.7  PHOS  --   --   --  4.1 4.6 4.5   GFR: Estimated Creatinine Clearance: 51.4 mL/min (by C-G formula based on SCr of 1.29 mg/dL (H)). Liver Function Tests:  Recent Labs Lab 02/01/16 0311 02/03/16 0409  AST 17 12*  ALT 13* 13*  ALKPHOS 36* 39  BILITOT 0.4 0.7  PROT 5.6* 5.8*  ALBUMIN 2.8* 2.7*   No results for input(s): LIPASE, AMYLASE in the last 168 hours. No results for input(s): AMMONIA in the last 168 hours. Coagulation  Profile: No results for input(s): INR, PROTIME in the last 168 hours. Cardiac Enzymes:  Recent Labs Lab 01/31/16 0140 01/31/16 0448 01/31/16 1103  TROPONINI 0.03* 0.03* <0.03   BNP (last 3 results) No results for input(s): PROBNP in the last 8760 hours. HbA1C: No results for input(s): HGBA1C in the last 72 hours. CBG:  Recent Labs Lab 02/02/16 0731 02/02/16 1715 02/02/16 2215 02/03/16 0659 02/03/16 1251  GLUCAP 170* 155* 232* 146*  151*   Lipid Profile: No results for input(s): CHOL, HDL, LDLCALC, TRIG, CHOLHDL, LDLDIRECT in the last 72 hours. Thyroid Function Tests: No results for input(s): TSH, T4TOTAL, FREET4, T3FREE, THYROIDAB in the last 72 hours. Anemia Panel: No results for input(s): VITAMINB12, FOLATE, FERRITIN, TIBC, IRON, RETICCTPCT in the last 72 hours. Sepsis Labs: No results for input(s): PROCALCITON, LATICACIDVEN in the last 168 hours.  Recent Results (from the past 240 hour(s))  MRSA PCR Screening     Status: None   Collection Time: 01/31/16  6:28 AM  Result Value Ref Range Status   MRSA by PCR NEGATIVE NEGATIVE Final    Comment:        The GeneXpert MRSA Assay (FDA approved for NASAL specimens only), is one component of a comprehensive MRSA colonization surveillance program. It is not intended to diagnose MRSA infection nor to guide or monitor treatment for MRSA infections.   Culture, blood (Routine X 2) w Reflex to ID Panel     Status: None (Preliminary result)   Collection Time: 02/01/16  7:41 AM  Result Value Ref Range Status   Specimen Description BLOOD RIGHT ANTECUBITAL  Final   Special Requests BOTTLES DRAWN AEROBIC AND ANAEROBIC 5CC  Final   Culture NO GROWTH 1 DAY  Final   Report Status PENDING  Incomplete  Culture, blood (Routine X 2) w Reflex to ID Panel     Status: Abnormal (Preliminary result)   Collection Time: 02/01/16  7:43 AM  Result Value Ref Range Status   Specimen Description BLOOD LEFT ARM  Final   Special Requests IN  PEDIATRIC BOTTLE 3CC  Final   Culture  Setup Time   Final    GRAM POSITIVE COCCI IN CLUSTERS AEROBIC BOTTLE ONLY Organism ID to follow CRITICAL RESULT CALLED TO, READ BACK BY AND VERIFIED WITH: A. Masters Pharm.D. 9:05 02/02/16  ([email or web address])    Culture (A)  Final    STAPHYLOCOCCUS SPECIES (COAGULASE NEGATIVE) THE SIGNIFICANCE OF ISOLATING THIS ORGANISM FROM A SINGLE SET OF BLOOD CULTURES WHEN MULTIPLE SETS ARE DRAWN IS UNCERTAIN. PLEASE NOTIFY THE MICROBIOLOGY DEPARTMENT WITHIN ONE WEEK IF SPECIATION AND SENSITIVITIES ARE REQUIRED.    Report Status PENDING  Incomplete  Blood Culture ID Panel (Reflexed)     Status: Abnormal   Collection Time: 02/01/16  7:43 AM  Result Value Ref Range Status   Enterococcus species NOT DETECTED NOT DETECTED Final   Listeria monocytogenes NOT DETECTED NOT DETECTED Final   Staphylococcus species DETECTED (A) NOT DETECTED Final    Comment: CRITICAL RESULT CALLED TO, READ BACK BY AND VERIFIED WITH: A. Masters Pharm.D. 9:05 02/02/16 ([email or web address])    Staphylococcus aureus NOT DETECTED NOT DETECTED Final   Methicillin resistance DETECTED (A) NOT DETECTED Final    Comment: CRITICAL RESULT CALLED TO, READ BACK BY AND VERIFIED WITH: A. Masters Pharm.D. 9:05 02/02/16 ([email or web address])    Streptococcus species NOT DETECTED NOT DETECTED Final   Streptococcus agalactiae NOT DETECTED NOT DETECTED Final   Streptococcus pneumoniae NOT DETECTED NOT DETECTED Final   Streptococcus pyogenes NOT DETECTED NOT DETECTED Final   Acinetobacter baumannii NOT DETECTED NOT DETECTED Final   Enterobacteriaceae species NOT DETECTED NOT DETECTED Final   Enterobacter cloacae complex NOT DETECTED NOT DETECTED Final   Escherichia coli NOT DETECTED NOT DETECTED Final   Klebsiella oxytoca NOT DETECTED NOT DETECTED Final   Klebsiella pneumoniae NOT DETECTED NOT DETECTED Final   Proteus species NOT DETECTED NOT DETECTED Final   Serratia marcescens NOT DETECTED NOT DETECTED Final  Haemophilus influenzae  NOT DETECTED NOT DETECTED Final   Neisseria meningitidis NOT DETECTED NOT DETECTED Final   Pseudomonas aeruginosa NOT DETECTED NOT DETECTED Final   Candida albicans NOT DETECTED NOT DETECTED Final   Candida glabrata NOT DETECTED NOT DETECTED Final   Candida krusei NOT DETECTED NOT DETECTED Final   Candida parapsilosis NOT DETECTED NOT DETECTED Final   Candida tropicalis NOT DETECTED NOT DETECTED Final    Radiology Studies: Dg Chest Port 1 View  Result Date: 02/02/2016 CLINICAL DATA:  Shortness of breath. EXAM: PORTABLE CHEST 1 VIEW COMPARISON:  Chest radiograph yesterday at 0626 hour FINDINGS: Mild cardiomegaly is unchanged. Left pleural effusion is unchanged. Unchanged pulmonary vasculature with vascular congestion, possible pulmonary edema. Increased bibasilar atelectasis. There are remote left rib fractures. IMPRESSION: Increased bibasilar atelectasis. Otherwise unchanged appearance of the chest suggesting CHF. Electronically Signed   By: Rubye Oaks M.D.   On: 02/02/2016 23:46   ECHOCARDIOGRAM:  Study Conclusions  - Left ventricle: The cavity size was normal. Systolic function was   vigorous. The estimated ejection fraction was in the range of 65%   to 70%. The study is not technically sufficient to allow   evaluation of LV diastolic function. - Mitral valve: Calcified annulus. - Right ventricle: The cavity size was mildly dilated. - Right atrium: The atrium was mildly dilated. - Inferior vena cava: The vessel was dilated. The respirophasic   diameter changes were blunted (< 50%), consistent with elevated   central venous pressure.  Scheduled Meds: . ceFEPime (MAXIPIME) IV  2 g Intravenous Q24H  . diltiazem  240 mg Oral Daily  . ferrous sulfate  325 mg Oral Q breakfast  . furosemide  20 mg Oral Daily  . insulin aspart  0-9 Units Subcutaneous TID WC  . ipratropium-albuterol  3 mL Nebulization Q6H  . lactobacillus acidophilus  1 tablet Oral Daily  . magnesium oxide  400  mg Oral BID  . metoprolol tartrate  25 mg Oral BID  . mometasone-formoterol  2 puff Inhalation BID  . pantoprazole  40 mg Oral Daily  . potassium chloride  20 mEq Oral Once  . rivaroxaban  20 mg Oral Q supper  . sodium chloride flush  3 mL Intravenous Q12H  . sodium chloride HYPERTONIC  4 mL Nebulization Daily  . spironolactone  25 mg Oral Daily  . traZODone  50 mg Oral QHS  . vancomycin  1,250 mg Intravenous Q24H  . zolpidem  5 mg Oral Once   Continuous Infusions:    LOS: 4 days   Merlene Laughter, DO [facility] Pager [phone]  If 7PM-7AM, please contact night-coverage [email or web address] Password [email or web address] 02/03/2016, 2:45 PM

## 2016-02-03 NOTE — Progress Notes (Signed)
Patient went into Afib with HR 130s-150s for 2 minutes. Patient now in NSR HR 90s. No s/sx of acute distress. Will continue to monitor.

## 2016-02-03 NOTE — Consult Note (Signed)
Consultation Note Date: 02/03/2016   Patient Name: FALCONat M Rock  DOB: 07/18/1945  MRN: [id number]  Age / Sex: 70 y.o., female  PCP: Renford Dillsonald Polite, MD Referring Physician: Merlene Laughtermair Latif Sheikh, DO  Reason for Consultation: Establishing goals of care, Hospice Evaluation and Psychosocial/spiritual support  HPI/Patient Profile: 70 y.o. female  with past medical history of Chronic respiratory failure, untreated sleep apnea, chronic atrial fib on anticoagulation, COPD, diabetes, anemia, admitted on 01/30/2016 with acute respiratory failure with associated encephalopathy. This is patient's third hospitalization in 6 months. She states she's been dealing with COPD for almost 20 years but this year she had developed as a sharp decline. She is wearing oxygen around-the-clock at 4 L and is becoming housebound. She has combined hypoxia as well as hypercarbic respiratory failure. She was diagnosed in the past with obstructive sleep apnea but was unable to tolerate CPAP with a mask due to claustrophobia. She has been intubated in the past, and was placed on BiPAP during this admission which she also is having difficulty tolerating because of claustrophobia.   Clinical Assessment and Goals of Care: Patient recognizes that she is getting sicker but states "I'm not ready to die". She is finding all the machines, BiPAP almost intolerable secondary to claustrophobia. She's also been wrestling with her CODE STATUS between being a full code, a DO NOT RESUSCITATE, and is currently now a partial code which includes everything except reintubation; and is questioning this at this point.  She has one living sibling sister POUCHERorma Noonan who lives in [place] but who is down to visit at this time. CLAUSINGorma is her healthcare power of attorney. Patient lives alone. She is widowed; her husband died from prostate cancer. She has no children  Patient at  this point can still speak for herself. Her sister SANTIZOorma Noonan is her healthcare power of attorney. She is widowed with no children Norma's telephone numbers [phone]:[phone] ( this is Addilynn's house; FRESEorma is staying there temporarily) cell: [phone]  SUMMARY OF RECOMMENDATIONS   Continue partial code for now which includes everything except intubation Palliative medicine to arrange a meeting hopefully for 02/04/2016 for further goals of care discussion between patient and her healthcare power of attorney Patient agrees to wear BiPAP Could patient be set up for an outpatient sleep study to reevaluate her level of obstructive sleep apnea as well as an appliance that's not a full face mask that would accommodate oxygen bleed in? She is very willing to do this and motivated at this point but cannot tolerate a full face mask. Hopefully this not only would provide her better quality of life but also enable her to stay out of the hospital in terms of minimizing CO2 buildup which is her goal. Patient would have difficulty going to the sleep lab because of her oxygen requirements at 4 L. I did introduce the concept of hospice to assist her in the home. She did share with me that her husband died under the care of hospice from prostate cancer. She felt like  they did a good job but it's hard for her to visualize that she is eligible for hospice unless she is at EOL and as she stated "I'm not ready to die. I attempted to normalize this approach of just from a practical standpoint about the support they offer and that often people elect hospice too late in the disease trajectory.  Code Status/Advance Care Planning:  Limited code    Symptom Management:   Dyspnea: Continue with targeted pulmonary treatments for now. Per pulmonology's note still attempting to minimize opioids as well as benzodiazepines because of hypercarbia  Palliative Prophylaxis:   Aspiration, Bowel Regimen, Oral Care and Turn  Reposition  Additional Recommendations (Limitations, Scope, Preferences):  Full Scope Treatment except for intubation. Pt will wear bipap  Psycho-social/Spiritual:   Desire for further Chaplaincy support:no  Additional Recommendations: Referral to Community Resources   Prognosis:   < 6 months in the setting of acute on chronic respiratory failure, both hypoxic and hypercarbic, worsening functional decline now wearing oxygen around-the-clock and becoming housebound; needing help for most activities of daily living  Discharge Planning: To Be Determined      Primary Diagnoses: Present on Admission: . COPD exacerbation (HCC) . Acute on chronic respiratory failure with hypoxia and hypercapnia (HCC) . OSA (obstructive sleep apnea) . Iron deficiency anemia . Hypertension . Diabetes mellitus type II, uncontrolled (HCC) . Acute respiratory failure (HCC) . COPD (chronic obstructive pulmonary disease) (HCC) . Acute on chronic respiratory failure with hypoxia (HCC)   I have reviewed the medical record, interviewed the patient and family, and examined the patient. The following aspects are pertinent.  Past Medical History:  Diagnosis Date  . Acute respiratory failure (HCC) 02/05/2017  . Adrenal disorder, other    "adrenal Incidentaloma"  . Anemia   . Aneurysm (HCC)    x2  . Arthritis   . Asthma   . Atrial fibrillation (HCC)   . 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 Bronchitis    history of  . Cerebral aneurysm   . Complication of anesthesia    Has woken up during surgery before  . COPD (chronic obstructive pulmonary disease) (HCC)    Dr. [name], uses 2.5L of o2 as needed  . Depression    due to loss of spouse 6 years ago  . Diabetes mellitus    metformin  . Emphysema of lung (HCC)    requested anethesia consult   . GERD (gastroesophageal reflux disease)    omeprazole  . Hypertension   . Obesity   . Rheumatoid  arthritis(714.0)    has had for approx. 40years, sees Dr. [name]  . Shortness of breath   . Sleep apnea    does not use cpap  . Urinary tract infection    history of   Social History   Social History  . Marital status: Widowed    Spouse name: N/A  . Number of children: N/A  . Years of education: N/A   Social History Main Topics  . Smoking status: Former Smoker    Packs/day: 1.50    Years: 40.00    Types: Cigarettes  . Smokeless tobacco: Never Used     Comment: quit 2006  . Alcohol use 0.6 oz/week    1 Shots of liquor per week  . Drug use: No  . Sexual activity: Not Asked   Other Topics Concern  . None   Social History Narrative  .  None   Family History  Problem Relation Age of Onset  . Heart disease Mother   . Cancer Father     lung  . Cancer Sister     breast   Scheduled Meds: . ceFEPime (MAXIPIME) IV  2 g Intravenous Q24H  . diltiazem  240 mg Oral Daily  . ferrous sulfate  325 mg Oral Q breakfast  . furosemide  20 mg Oral Daily  . insulin aspart  0-9 Units Subcutaneous TID WC  . ipratropium-albuterol  3 mL Nebulization Q6H  . lactobacillus acidophilus  1 tablet Oral Daily  . magnesium oxide  400 mg Oral BID  . metoprolol tartrate  25 mg Oral BID  . mometasone-formoterol  2 puff Inhalation BID  . pantoprazole  40 mg Oral Daily  . potassium chloride  20 mEq Oral Once  . rivaroxaban  20 mg Oral Q supper  . sodium chloride flush  3 mL Intravenous Q12H  . sodium chloride HYPERTONIC  4 mL Nebulization Daily  . spironolactone  25 mg Oral Daily  . traZODone  50 mg Oral QHS  . vancomycin  1,250 mg Intravenous Q24H  . zolpidem  5 mg Oral Once   Continuous Infusions:  PRN Meds:.acetaminophen **OR** acetaminophen, HYDROcodone-acetaminophen, ipratropium-albuterol, LORazepam, metoprolol, ondansetron **OR** ondansetron (ZOFRAN) IV Medications Prior to Admission:  Prior to Admission medications   Medication Sig Start Date End Date Taking? Authorizing Provider    acetaminophen (TYLENOL) 325 MG tablet Take 1 tablet (325 mg total) by mouth every 4 (four) hours as needed for mild pain, moderate pain, fever or headache. 09/28/15  Yes Starleen Arms, MD  albuterol (PROAIR HFA) 108 (90 BASE) MCG/ACT inhaler Inhale 2 puffs into the lungs every 6 (six) hours as needed. For shortness of breath   Yes Historical Provider, MD  budesonide-formoterol (SYMBICORT) 80-4.5 MCG/ACT inhaler Inhale 2 puffs into the lungs 2 (two) times daily.   Yes Historical Provider, MD  Calcium 600-200 MG-UNIT tablet Take 1 tablet by mouth daily.   Yes Historical Provider, MD  diltiazem (CARDIZEM SR) 120 MG 12 hr capsule Take 1 capsule (120 mg total) by mouth every 12 (twelve) hours. 01/14/16  Yes Lenox Ponds, MD  ferrous sulfate 325 (65 FE) MG tablet Take 325 mg by mouth daily with breakfast.   Yes Historical Provider, MD  furosemide (LASIX) 20 MG tablet Take 20 mg by mouth daily.   Yes Historical Provider, MD  HYDROcodone-acetaminophen (NORCO/VICODIN) 5-325 MG tablet Take 1 tablet by mouth every 6 (six) hours as needed for moderate pain.   Yes Historical Provider, MD  lactobacillus acidophilus (BACID) TABS tablet Take 2 tablets by mouth 3 (three) times daily. Patient taking differently: Take 1 tablet by mouth daily.  01/14/16  Yes Lenox Ponds, MD  metFORMIN (GLUCOPHAGE) 500 MG tablet Take 2 tablets by mouth 2 (two) times daily with a meal.   Yes Historical Provider, MD  metoprolol tartrate (LOPRESSOR) 25 MG tablet Take 1 tablet (25 mg total) by mouth 2 (two) times daily. 01/14/16  Yes Lenox Ponds, MD  Multiple Vitamins-Minerals (CENTRUM SILVER ADULT 50+ PO) Take 1 tablet by mouth daily.   Yes Historical Provider, MD  omeprazole (PRILOSEC) 20 MG capsule Take 20 mg by mouth daily.   Yes Historical Provider, MD  OXYGEN Inhale 4 L into the lungs continuous.    Yes Historical Provider, MD  predniSONE (DELTASONE) 10 MG tablet Please take   20 mg daily for 2 days, then 10 mg  daily until seen by PMD. Patient taking differently: Take 10 mg by mouth daily.  01/14/16  Yes Lenox Ponds, MD  rivaroxaban (XARELTO) 20 MG TABS tablet Take 1 tablet (20 mg total) by mouth daily with supper. 01/14/16  Yes Lenox Ponds, MD  spironolactone-hydrochlorothiazide (ALDACTAZIDE) 25-25 MG tablet Take 1 tablet by mouth daily.   Yes Historical Provider, MD  tiotropium (SPIRIVA) 18 MCG inhalation capsule Place 18 mcg into inhaler and inhale daily.    Yes Historical Provider, MD  traZODone (DESYREL) 50 MG tablet Take 50 mg by mouth at bedtime.   Yes Historical Provider, MD  LORazepam (ATIVAN) 0.5 MG tablet Take 1 tablet (0.5 mg total) by mouth every 8 (eight) hours as needed for anxiety. Patient not taking: Reported on 01/22/2016 09/28/15   Leana Roe Elgergawy, MD  mometasone-formoterol (DULERA) 100-5 MCG/ACT AERO Inhale 2 puffs into the lungs 2 (two) times daily. Patient not taking: Reported on 01/22/2016 01/14/16   Lenox Ponds, MD   Allergies  Allergen Reactions  . Gold-Containing Drug Products Other (See Comments)    Blisters   . Tape Dermatitis   Review of Systems  Unable to perform ROS: Severe respiratory distress    Physical Exam  Constitutional: She is oriented to person, place, and time. She appears well-developed and well-nourished.  HENT:  Head: Normocephalic and atraumatic.  Neck: Normal range of motion.  Cardiovascular:  Irrg, tachy  Pulmonary/Chest:  Increased work of breathing at rest with venti mask. Sats in the 80's  Musculoskeletal: Normal range of motion.  Neurological: She is alert and oriented to person, place, and time.  But becomes easily fatigued and falls asleep  Skin: Skin is warm and dry.  Psychiatric: Her behavior is normal. Judgment and thought content normal.  Worried, anxious  Nursing note and vitals reviewed.   Vital Signs: BP (!) 133/54 (BP Location: Left Arm)   Pulse 81   Temp 97.5 F (36.4 C) (Axillary)   Resp (!) 26   Ht  5\' 7"  (1.702 m)   Wt 108.1 kg (238 lb 5.1 oz)   SpO2 (!) 86%   BMI 37.33 kg/m  Pain Assessment: No/denies pain POSS *See Group Information*: 1-Acceptable,Awake and alert Pain Score: Asleep   SpO2: SpO2: (!) 86 % O2 Device:SpO2: (!) 86 % O2 Flow Rate: .O2 Flow Rate (L/min): 8 L/min  IO: Intake/output summary:  Intake/Output Summary (Last 24 hours) at 02/03/16 1306 Last data filed at 02/03/16 1200  Gross per 24 hour  Intake          1023.33 ml  Output             2150 ml  Net         -1126.67 ml    LBM: Last BM Date:  (pta) Baseline Weight: Weight: 108.9 kg (240 lb) Most recent weight: Weight: 108.1 kg (238 lb 5.1 oz)     Palliative Assessment/Data:   Flowsheet Rows   Flowsheet Row Most Recent Value  Intake Tab  Referral Department  Hospitalist  Unit at Time of Referral  Intermediate Care Unit  Palliative Care Primary Diagnosis  Pulmonary  Date Notified  02/03/16  Palliative Care Type  New Palliative care  Reason for referral  Non-pain Symptom, Clarify Goals of Care, Counsel Regarding Hospice, Psychosocial or Spiritual support  Date of Admission  01/30/16  Date first seen by Palliative Care  02/03/16  # of days Palliative referral response time  0 Day(s)  # of days IP prior  to Palliative referral  4  Clinical Assessment  Palliative Performance Scale Score  40%  Pain Max last 24 hours  0  Pain Min Last 24 hours  0  Dyspnea Max Last 24 Hours  8  Dyspnea Min Last 24 hours  3  Nausea Max Last 24 Hours  0  Nausea Min Last 24 Hours  0  Anxiety Min Last 24 Hours  0  Psychosocial & Spiritual Assessment  Palliative Care Outcomes  Patient/Family meeting held?  Yes  Who was at the meeting?  pt  Palliative Care Outcomes  Provided psychosocial or spiritual support, Counseled regarding hospice  Palliative Care follow-up planned  Yes, Facility      Time In: 1200 Time Out: 1300 Time Total: 60 min Greater than 50%  of this time was spent counseling and coordinating care  related to the above assessment and plan. Staffed with Dr. Marland Mcalpine Signed by: Irean Hong, NP   Please contact Palliative Medicine Team phone at [phone] for questions and concerns.  For individual provider: See Loretha Stapler

## 2016-02-03 NOTE — Progress Notes (Signed)
eLink Physician-Brief Progress Note Patient Name: ESSA WENK DOB: 1945-09-16 MRN: [id number]   Date of Service  02/03/2016  HPI/Events of Note  COPD/CHF/ OSA -hypercarbic resp failure Worsening resp acidosis on autobipap nasal mask, avg pr10/5   eICU Interventions  Change to NIV 15/8 Reduce FIO2 Lasix 40 IV Rpt ABG in 3h Intubate if mental status worsens     Intervention Category Major Interventions: Respiratory failure - evaluation and management  ALVA,RAKESH V. 02/03/2016, 3:54 AM

## 2016-02-03 NOTE — Progress Notes (Signed)
Dr Sherene Sires stated he wanted pt to get up into chair, pt refuses to get up to chair at this time. Marisue Ivan RN

## 2016-02-03 NOTE — Progress Notes (Signed)
Pt ate graham crackers and applesauce. CBG-260. No distress. Will continue to monitor.

## 2016-02-03 NOTE — Consult Note (Signed)
PULMONARY / CRITICAL CARE MEDICINE   Name: [name] MRN: [id number] DOB: 06-29-1945    ADMISSION DATE:  01/30/2016 CONSULTATION DATE:  02/03/16   REFERRING MD:  [facility]   CHIEF COMPLAINT: " I want to get off these machines"  HISTORY OF PRESENT ILLNESS:   70 yowf MO s/p smoking cessation around 2010 severely debilitated with sob across the room on home 02 around 4lpm last seen in pulmonary clinic in 2008 and did not return with no pfts in epic but carries dx of copd/ohs and admittted with acute on chronic hypercarbic resp failure and with previous eval by Tyson Alias in 01/06/16 and made DNR p brief ET "hated it" and readmit 01/30/16 with sob and PCCM asked to see am 10/15 with hypercapnea but refusing to continue bipap.   No obvious day to day or daytime variability or assoc excess/ purulent sputum or mucus plugs or hemoptysis or cp or chest tightness, subjective wheeze or overt sinus or hb symptoms. No unusual exp hx or h/o childhood pna/ asthma or knowledge of premature birth.    Also denies any obvious fluctuation of symptoms with weather or environmental changes or other aggravating or alleviating factors except as outlined above   Current Medications, Allergies, Complete Past Medical History, Past Surgical History, Family History, and Social History were reviewed in [employer] record.  ROS  The following are not active complaints unless bolded sore throat, dysphagia, dental problems, itching, sneezing,  nasal congestion or excess/ purulent secretions, ear ache,   fever, chills, sweats, unintended wt loss, classically pleuritic or exertional cp,  orthopnea pnd or leg swelling, presyncope, palpitations, abdominal pain, anorexia, nausea, vomiting, diarrhea  or change in bowel or bladder habits, change in stools or urine, dysuria,hematuria,  rash, arthralgias, visual complaints, headache, numbness, weakness or ataxia or problems with walking or coordination,  change in  mood/affect or memory.        PAST MEDICAL HISTORY :  She  has a past medical history of Acute respiratory failure (HCC) (02/05/2017); Adrenal disorder, other; Anemia; Aneurysm (HCC); Arthritis; Asthma; Atrial fibrillation (HCC); Brain aneurysm; Bronchitis; Cerebral aneurysm; Complication of anesthesia; COPD (chronic obstructive pulmonary disease) (HCC); Depression; Diabetes mellitus; Emphysema of lung (HCC); GERD (gastroesophageal reflux disease); Hypertension; Obesity; Rheumatoid arthritis(714.0); Shortness of breath; Sleep apnea; and Urinary tract infection.  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  Allergies  Allergen Reactions  . Gold-Containing Drug Products Other (See Comments)    Blisters   . Tape Dermatitis    No current facility-administered medications on file prior to encounter.    Current Outpatient Prescriptions on File Prior to Encounter  Medication Sig  . acetaminophen (TYLENOL) 325 MG tablet Take 1 tablet (325 mg total) by mouth every 4 (four) hours as needed for mild pain, moderate pain, fever or headache.  . albuterol (PROAIR HFA) 108 (90 BASE) MCG/ACT inhaler Inhale 2 puffs into the lungs every 6 (six) hours as needed. For shortness of breath  . budesonide-formoterol (SYMBICORT) 80-4.5 MCG/ACT inhaler Inhale 2 puffs into the lungs 2 (two) times daily.  . Calcium 600-200 MG-UNIT tablet Take 1 tablet by mouth daily.  Marland Kitchen diltiazem (CARDIZEM SR) 120 MG 12 hr capsule Take 1 capsule (120 mg total) by mouth every 12 (twelve) hours.  . ferrous sulfate 325 (65 FE) MG tablet Take 325 mg by  mouth daily with breakfast.  . furosemide (LASIX) 20 MG tablet Take 20 mg by mouth daily.  Marland Kitchen HYDROcodone-acetaminophen (NORCO/VICODIN) 5-325 MG tablet Take 1 tablet by mouth  every 6 (six) hours as needed for moderate pain.  Marland Kitchen lactobacillus acidophilus (BACID) TABS tablet Take 2 tablets by mouth 3 (three) times daily. (Patient taking differently: Take 1 tablet by mouth daily. )  . metFORMIN (GLUCOPHAGE) 500 MG tablet Take 2 tablets by mouth 2 (two) times daily with a meal.  . metoprolol tartrate (LOPRESSOR) 25 MG tablet Take 1 tablet (25 mg total) by mouth 2 (two) times daily.  . Multiple Vitamins-Minerals (CENTRUM SILVER ADULT 50+ PO) Take 1 tablet by mouth daily.  Marland Kitchen omeprazole (PRILOSEC) 20 MG capsule Take 20 mg by mouth daily.  . OXYGEN Inhale 4 L into the lungs continuous.   . predniSONE (DELTASONE) 10 MG tablet Please take   20 mg daily for 2 days, then 10 mg daily until seen by PMD. (Patient taking differently: Take 10 mg by mouth daily. )  . rivaroxaban (XARELTO) 20 MG TABS tablet Take 1 tablet (20 mg total) by mouth daily with supper.  . tiotropium (SPIRIVA) 18 MCG inhalation capsule Place 18 mcg into inhaler and inhale daily.   . traZODone (DESYREL) 50 MG tablet Take 50 mg by mouth at bedtime.  Marland Kitchen LORazepam (ATIVAN) 0.5 MG tablet Take 1 tablet (0.5 mg total) by mouth every 8 (eight) hours as needed for anxiety. (Patient not taking: Reported on 01/22/2016)  . mometasone-formoterol (DULERA) 100-5 MCG/ACT AERO Inhale 2 puffs into the lungs 2 (two) times daily. (Patient not taking: Reported on 01/22/2016)    FAMILY HISTORY:  Her indicated that her mother is deceased. She indicated that her father is deceased. She indicated that the status of her sister is unknown.    SOCIAL HISTORY: She  reports that she has quit smoking. Her smoking use included Cigarettes. She has a 60.00 pack-year smoking history. She has never used smokeless tobacco. She reports that she drinks about 0.6 oz of alcohol per week . She reports that she does not use drugs.    SUBJECTIVE:  Comfortable at 30 degrees hob able to speak in nearly full sentences on 02 facemask 4lpm with sats in mid  80s   VITAL SIGNS: BP 131/62 (BP Location: Left Arm)   Pulse 94   Temp 97.5 F (36.4 C) (Axillary)   Resp (!) 21   Ht 5\' 7"  (1.702 m)   Wt 238 lb 5.1 oz (108.1 kg)   SpO2 (!) 88%   BMI 37.33 kg/m   HEMODYNAMICS:    VENTILATOR SETTINGS: FiO2 (%):  [50 %-55 %] 50 %  INTAKE / OUTPUT: I/O last 3 completed shifts: In: 1873.3 [P.O.:1170; I.V.:3.3; IV Piggyback:700] Out: 3925 [Urine:3925]  PHYSICAL EXAMINATION: General: obese chronically ill appearing wf  Alert and appropriate    No jvd Orophararynx clear Neck supple Lungs with distant bs no wheeze  RRR no s3 or or sign murmur Abd obese with limited excursion in supine position  Extr wam with no edema or clubbing noted Neuro  No motor deficits     LABS:  BMET  Recent Labs Lab 02/01/16 0311 02/02/16 0306 02/03/16 0409  NA 134* 137 138  K 4.3 3.4* 4.0  CL 81* 79* 82*  CO2 46* 49* 44*  BUN 20 17 18   CREATININE 1.56* 1.36* 1.29*  GLUCOSE 190* 142* 171*    Electrolytes  Recent Labs Lab 02/01/16 0311 02/02/16 0306 02/03/16 0409  CALCIUM 8.5* 8.8* 9.0  MG 1.6* 1.5* 1.7  PHOS 4.1 4.6 4.5    CBC  Recent Labs Lab 02/01/16 0311 02/02/16 0306 02/03/16 0409  WBC 16.8* 12.2* 10.0  HGB 8.5* 10.0* 10.1*  HCT 28.4* 34.4* 35.0*  PLT 281 278 256    Coag's No results for input(s): APTT, INR in the last 168 hours.  Sepsis Markers No results for input(s): LATICACIDVEN, PROCALCITON, O2SATVEN in the last 168 hours.  ABG  Recent Labs Lab 02/02/16 2255 02/03/16 0310 02/03/16 0730  PHART 7.404 7.317* 7.343*  PCO2ART 73.7* 101* 94.9*  PO2ART 86.6 69.7* 59.9*    Liver Enzymes  Recent Labs Lab 02/01/16 0311 02/03/16 0409  AST 17 12*  ALT 13* 13*  ALKPHOS 36* 39  BILITOT 0.4 0.7  ALBUMIN 2.8* 2.7*    Cardiac Enzymes  Recent Labs Lab 01/31/16 0140 01/31/16 0448 01/31/16 1103  TROPONINI 0.03* 0.03* <0.03    Glucose  Recent Labs Lab 02/01/16 1603 02/01/16 2148 02/02/16 0731  02/02/16 1715 02/02/16 2215 02/03/16 0659  GLUCAP 204* 223* 170* 155* 232* 146*    Imaging Dg Chest Port 1 View  Result Date: 02/02/2016 CLINICAL DATA:  Shortness of breath. EXAM: PORTABLE CHEST 1 VIEW COMPARISON:  Chest radiograph yesterday at 0626 hour FINDINGS: Mild cardiomegaly is unchanged. Left pleural effusion is unchanged. Unchanged pulmonary vasculature with vascular congestion, possible pulmonary edema. Increased bibasilar atelectasis. There are remote left rib fractures. IMPRESSION: Increased bibasilar atelectasis. Otherwise unchanged appearance of the chest suggesting CHF. Electronically Signed   By: Rubye Oaks M.D.   On: 02/02/2016 23:46     ASSESSMENT / PLAN:     1) endstage copd/e with OHS  (can't confirm the copd component but strongly suspect)   2) acute on chronic hypercarbic and hypoxemic RF secondary to #2  3) pt refusing ET or bipap "can't live like this, I want to eat"   Though somewhat paradoxic, when the lung fails to clear C02 properly and pC02 rises the lung then becomes a more efficient scavenger of C02 allowing lower work of breathing and  better C02 clearance albeit at a higher serum pC02 level - this is why pts can look a lot better than their ABG's would suggest and why it's so difficult to prognosticate endstage dz.  It's also why I strongly rec DNI status (ventilating pts down to a nl pC02 adversely affects this compensatory mechanism) which she has agreed to in the past and has experienced both bipap and ET, remembers both, refuses both.   Therefore rec we only treat the 02 sats targeting mid 80's at most and make every effort to mobilize her to chair, minimize narcs/benzo's and not worry about pC02 levels from this point forward.   I would be happy to also discuss with fm but pt actually remembered me from 2008 and seemed quite sharp mentally during our conversation today so does not need fm's input on DNR order which I signed today (but will  ask her/nursing to let fm know of her decision)    Sandrea Hughs, MD Pulmonary and Critical Care Medicine [facility] Cell [phone] After 5:30 PM or weekends, call [phone]

## 2016-02-04 ENCOUNTER — Other Ambulatory Visit: Payer: Self-pay | Admitting: *Deleted

## 2016-02-04 DIAGNOSIS — J449 Chronic obstructive pulmonary disease, unspecified: Secondary | ICD-10-CM

## 2016-02-04 DIAGNOSIS — J9621 Acute and chronic respiratory failure with hypoxia: Secondary | ICD-10-CM

## 2016-02-04 DIAGNOSIS — I5041 Acute combined systolic (congestive) and diastolic (congestive) heart failure: Secondary | ICD-10-CM

## 2016-02-04 DIAGNOSIS — J9622 Acute and chronic respiratory failure with hypercapnia: Secondary | ICD-10-CM

## 2016-02-04 DIAGNOSIS — J441 Chronic obstructive pulmonary disease with (acute) exacerbation: Secondary | ICD-10-CM

## 2016-02-04 LAB — BLOOD GAS, ARTERIAL
ACID-BASE EXCESS: 22.9 mmol/L — AB (ref 0.0–2.0)
Bicarbonate: 50.2 mmol/L — ABNORMAL HIGH (ref 20.0–28.0)
DELIVERY SYSTEMS: POSITIVE
Drawn by: 236041
EXPIRATORY PAP: 5
Inspiratory PAP: 10
O2 CONTENT: 8 L/min
O2 Saturation: 91.7 %
PH ART: 7.317 — AB (ref 7.350–7.450)
Patient temperature: 98.6
pCO2 arterial: 101 mmHg (ref 32.0–48.0)
pO2, Arterial: 69.7 mmHg — ABNORMAL LOW (ref 83.0–108.0)

## 2016-02-04 LAB — COMPREHENSIVE METABOLIC PANEL
ALK PHOS: 39 U/L (ref 38–126)
ALT: 11 U/L — ABNORMAL LOW (ref 14–54)
ANION GAP: 8 (ref 5–15)
AST: 10 U/L — ABNORMAL LOW (ref 15–41)
Albumin: 2.7 g/dL — ABNORMAL LOW (ref 3.5–5.0)
BILIRUBIN TOTAL: 0.7 mg/dL (ref 0.3–1.2)
BUN: 20 mg/dL (ref 6–20)
CALCIUM: 8.9 mg/dL (ref 8.9–10.3)
CO2: 48 mmol/L — ABNORMAL HIGH (ref 22–32)
Chloride: 79 mmol/L — ABNORMAL LOW (ref 101–111)
Creatinine, Ser: 1.35 mg/dL — ABNORMAL HIGH (ref 0.44–1.00)
GFR, EST AFRICAN AMERICAN: 45 mL/min — AB (ref >60)
GFR, EST NON AFRICAN AMERICAN: 39 mL/min — AB (ref >60)
Glucose, Bld: 170 mg/dL — ABNORMAL HIGH (ref 65–99)
Potassium: 4.4 mmol/L (ref 3.5–5.1)
Sodium: 135 mmol/L (ref 135–145)
TOTAL PROTEIN: 6 g/dL — AB (ref 6.5–8.1)

## 2016-02-04 LAB — CBC WITH DIFFERENTIAL/PLATELET
Basophils Absolute: 0 10*3/uL (ref 0.0–0.1)
Basophils Relative: 0 %
EOS ABS: 0.4 10*3/uL (ref 0.0–0.7)
Eosinophils Relative: 4 %
HEMATOCRIT: 34.7 % — AB (ref 36.0–46.0)
HEMOGLOBIN: 10.2 g/dL — AB (ref 12.0–15.0)
LYMPHS ABS: 1.7 10*3/uL (ref 0.7–4.0)
Lymphocytes Relative: 16 %
MCH: 27.2 pg (ref 26.0–34.0)
MCHC: 29.4 g/dL — AB (ref 30.0–36.0)
MCV: 92.5 fL (ref 78.0–100.0)
MONO ABS: 0.7 10*3/uL (ref 0.1–1.0)
MONOS PCT: 7 %
NEUTROS ABS: 7.8 10*3/uL — AB (ref 1.7–7.7)
NEUTROS PCT: 73 %
Platelets: 276 10*3/uL (ref 150–400)
RBC: 3.75 MIL/uL — ABNORMAL LOW (ref 3.87–5.11)
RDW: 16.9 % — ABNORMAL HIGH (ref 11.5–15.5)
WBC: 10.7 10*3/uL — ABNORMAL HIGH (ref 4.0–10.5)

## 2016-02-04 LAB — CULTURE, BLOOD (ROUTINE X 2)

## 2016-02-04 LAB — GLUCOSE, CAPILLARY
GLUCOSE-CAPILLARY: 261 mg/dL — AB (ref 65–99)
GLUCOSE-CAPILLARY: 162 mg/dL — AB (ref 65–99)
GLUCOSE-CAPILLARY: 234 mg/dL — AB (ref 65–99)
GLUCOSE-CAPILLARY: 134 mg/dL — AB (ref 65–99)
GLUCOSE-CAPILLARY: 290 mg/dL — AB (ref 65–99)

## 2016-02-04 LAB — MAGNESIUM: MAGNESIUM: 1.6 mg/dL — AB (ref 1.7–2.4)

## 2016-02-04 LAB — PHOSPHORUS: PHOSPHORUS: 3.7 mg/dL (ref 2.5–4.6)

## 2016-02-04 MED ORDER — DILTIAZEM HCL-DEXTROSE 100-5 MG/100ML-% IV SOLN (PREMIX)
5.0000 mg/h | INTRAVENOUS | Status: DC
  Administered 2016-02-05: 5 mg/h via INTRAVENOUS
  Filled 2016-02-04 (×2): qty 100

## 2016-02-04 MED ORDER — SENNA 8.6 MG PO TABS
1.0000 | ORAL_TABLET | Freq: Every day | ORAL | Status: DC
  Administered 2016-02-04 – 2016-02-07 (×4): 8.6 mg via ORAL
  Filled 2016-02-04 (×5): qty 1

## 2016-02-04 MED ORDER — POLYETHYLENE GLYCOL 3350 17 G PO PACK
17.0000 g | PACK | Freq: Every day | ORAL | Status: DC
  Administered 2016-02-04 – 2016-02-06 (×3): 17 g via ORAL
  Filled 2016-02-04 (×5): qty 1

## 2016-02-04 MED ORDER — FUROSEMIDE 10 MG/ML IJ SOLN
20.0000 mg | Freq: Every day | INTRAMUSCULAR | Status: DC
  Administered 2016-02-05 – 2016-02-07 (×3): 20 mg via INTRAVENOUS
  Filled 2016-02-04 (×3): qty 2

## 2016-02-04 MED ORDER — DIPHENHYDRAMINE HCL 25 MG PO CAPS
25.0000 mg | ORAL_CAPSULE | Freq: Four times a day (QID) | ORAL | Status: DC | PRN
  Administered 2016-02-04 – 2016-02-06 (×5): 25 mg via ORAL
  Filled 2016-02-04 (×6): qty 1

## 2016-02-04 MED ORDER — FUROSEMIDE 10 MG/ML IJ SOLN: 20.0000 mg | Freq: Two times a day (BID) | INTRAMUSCULAR | Status: DC

## 2016-02-04 MED ORDER — AMIODARONE HCL 200 MG PO TABS
400.0000 mg | ORAL_TABLET | Freq: Three times a day (TID) | ORAL | Status: DC
  Administered 2016-02-04 – 2016-02-05 (×6): 400 mg via ORAL
  Filled 2016-02-04 (×6): qty 2

## 2016-02-04 MED ORDER — SODIUM CHLORIDE 0.9 % IV SOLN
INTRAVENOUS | Status: DC
  Administered 2016-02-04 – 2016-02-05 (×2): via INTRAVENOUS

## 2016-02-04 NOTE — Progress Notes (Addendum)
Pt removed 15L HFNC this AM and immediately had her O2 sats drop to 72%. At the same time her HR increased, and sustained, 150-160's. Appears to have converted back to Afib with RVR. Placed HFNC back on patient and gave 5mg  IV metoprolol at 0749. O2 sats increased to high 80's/low 90's, and HR decreased to 100's, but then continued to increase back up to 150-160's. Dr. notified at [phone] and new orders for cardizem infusion received. Will continue to monitor.  [id number], RN.  [phone] - Attempted to start cardizem infusion and patient's IV was infiltrated/very painful to flush. Stat IV team consult placed.   1010 - New IV inserted by IV nurse.  1030 - Dr. [id number] present in patient's room. This RN to initiate cardizem infusion and patient converted to NSR while in the room. Dr. Marland Mcalpine still in room and aware of patient converting. Cardizem put on hold at present. Cardiology MD to come see patient.   Marland Mcalpine, RN.

## 2016-02-04 NOTE — Plan of Care (Signed)
Problem: Physical Regulation: Goal: Ability to maintain clinical measurements within normal limits will improve Outcome: Progressing HR better controlled while on cardizem drip.   Problem: Nutrition: Goal: Adequate nutrition will be maintained Outcome: Progressing Tolerating PO's.   Problem: Bowel/Gastric: Goal: Will not experience complications related to bowel motility Outcome: Progressing LBM unknown. Received new orders for miralax and senna today.

## 2016-02-04 NOTE — Progress Notes (Signed)
Patient converted back to Afib at 1430. Rate has been sustained mostly 100-110's, but has had multiple episodes of fluctuations up to 130-140's. Dr. Marland Mcalpine notified. Stated to start cardizem infusion at 5mg /hr and wait for cardiology MD to evaluate patient.   , RN.

## 2016-02-04 NOTE — Progress Notes (Signed)
Placed patient on BiPAP for the night. Patient is tolerating well and RT will continue to monitor.

## 2016-02-04 NOTE — Progress Notes (Signed)
PROGRESS NOTE    PELLEat M Tietze  GNF:[id number] DOB: 11/20/1945 DOA: 01/30/2016 PCP: Katy ApoPOLITE,RONALD D, MD   Brief Narrative:  DROZDOWSKIat M Salley is a 70 y.o. female admitted 3 weeks ago for respiratory failure and encephalopathy with known hx of sleep apnea non compliant with CPAP, Chronic Afib on Anticoagulation, COPD, and DM, presents with worsening shortness of breath. Patient states she has chronic shortness of breath which had acutely worsened today with increasing lower extremity edema. and chest tightness. Early in the morning at her house, patient also had a fall and hit her head but did not lose consciousness after she lost her balance. CXR showed congestion and mild pleural effusion with possible infiltrates. Patient on exam, is mildly short of breath but able to complete sentences. JVD was elevated with bilateral lower extremity edema and mild wheezing. Since CXR was showing possible infiltrates patient was started on antibiotics for pneumonia . Patient also has clinical features consistent with CHF and had been given lasix 40mg  and placed on BiPAP. Patient was admitted for further managemt of acue respiratory failure possibly a combination of CHF and COPD with possible pneumonia. Dr. Jacinto HalimGanji her primary Cardiologist was consulted for further recommendations. Patient converted to NSR and was transferred to SDU. Overnight the patient went into respiratory Distress and eLink Physician was notified and she was placed on BiPAP. Pulmonary came to talk with her today and she decided "no more machines" and stated she did not want the BiPAP or be Intubated. Palliative Care was consulted for additional recommendations. Patient went back into Atrial Fib with RVR earlier this AM and Dr. Jacinto HalimGanji was called and stated to load her with Amiodarone 400 mg po TID.   Assessment & Plan:   Principal Problem:   Acute on chronic respiratory failure with hypoxia and hypercapnia (HCC) Active Problems:   Diabetes mellitus type  II, uncontrolled (HCC)   COPD (chronic obstructive pulmonary disease) (HCC)   OSA (obstructive sleep apnea)   Iron deficiency anemia   Acute on chronic respiratory failure with hypoxia (HCC)   Hypertension   COPD exacerbation (HCC)   Type 2 diabetes mellitus without complication, without long-term current use of insulin (HCC)   Acute respiratory failure (HCC)   CHF (congestive heart failure) (HCC)   Palliative care encounter   Fall   SOB (shortness of breath)  Acute on Chronic Hypercapnic Hypoxic Respiratory Failure likley 2/2 Acute Exacerbation of CHF with likely concomitant COPD and ? HCAP poA -Pulmonology Consulted yesterday because of Respiratory Distress and was placed on BiPAP 20/8. Now Transitioned on to HFNC; Per Pumonology attempt to Wean -Discussed plan with Patient and she stated she did not want any more BiPAP and did not want to be Intubated. Code Status Changed to DNI.  -Continue to Monitor O2 Sats; -Palliative Care Consulted for additional Recc's -Family meeting tomorrow with Palliative Care as patient's Sister could not make it today.   Supected Acute Decompensation of Chronic Diastolic of CHF with EF of 60-65% -No documented Hx of CHF and last ECHO done 02/08/15 showed Left ventricle: The cavity size was normal. Systolic function was  normal. The estimated ejection fraction was in the range of 55% to 60%. Wall motion was normal; there were no regional wall  motion abnormalities.- Left atrium: The atrium was mildly dilated. -Repeat ECHOcardiogram done and showed Systolic Function was vigorous with Estimated EF of 65-70%. The study is not technically sufficient to allow evaluation of LV diastolic function -C/w Daily Weights, Strict I's and  O's, 1200 mL Fluid Restriction  -Appreciate Dr. Cardiology's Dr. Verl Dicker Input and Recc's -BNP was 466.8; Repeat was 66.3 -Cardiac Troponins Negative x3 -C/w Aldactone 25 mg po Daily -Was given IV Lasix overnight because of Respiratory  Distress and Foley Catheter was placed. -D/C'd po Lasix and Started back on IV 20 mg Daily  COPD and ? HCAP poA -CXR showed Ill-defined opacities in the lung bases could relate to atelectasis or infiltrate. Tiny bilateral effusions. -Patients WBC was 14.9 on Admission -> 9.7 -> 16.8  -> 12.2 -> 10.7 now -Will continue IV Abx of Cefepime and IV Vancomycin for now and switch to more targeted Therapy once goals of Care have been decided.  -C/w Duo Neb q4h and q2hprn and with Dulera -Repeat CXR showed Bibasilar Subsegmental Atelectasis.  -Repeat CBC in AM with Differential -Consider Adding Steroids if not Improved  Paroxysmal Atrial Fibrillation with RVR (hx cardioversion 09/2015) s/p Spontaneous Conversion to NSR - Chads2vasc score is 4.  - C/w Telemetry - Patient is on Xarelto for Anticoagulation.  - Consulted Dr. [name] in Cardiology. Appreciate Input and Recc's - Continue Cardizem gtt, po Cardizem 240 mg and Metoprolol IV 5 mg q4hprn and Metoprolol 25 mg po BID .  - Had to add patient back on Cardizem gtt because of A.Fib. Discussed case with Dr. Jacinto Halim and patient was placed on Amiodarone 400 mg po TID - Appreciate Dr. Verl Dicker Help. - C/w Current Recc's  Blood Culture Contiamination? -1/2 Vials had Methicillin Resistant Staph Species not MRSA -Repeat Blood Cultures -C/w IV Vanc for Now.   DM type 2 - Blood Sugar on BMP was 171 - Will be on Sensitive sliding scale coverage.    CKD stage III. - Patient's BUN/Cr was  17/1.36 -> 18/1.29 -> 20/1.35 - Repeat CMP in AM  Chronic Iron deficiency Anemia - On iron replacement with Ferrous Sulfate 325 mg po Daily.  - Hemoglobin went from 9.1/30.9 -> 8.5/28.4 -> 10.0/34.4 -> 10.2/34.7 - Repeat CBC in AM with Diff  OSA.  -Non compliant with CPAP and did not wear last night.  -Discuss with Palliative -Possible Sleep Study as an out Patient  Mechanical Fall. - Patient has a history of brain aneurysm with coiling.  - CT head showed  Left internal carotid artery terminus and basilar coil masses with extensive streak artifact obscuring the brain at those levels. Within the visualized brain no acute abnormality is identified. Grossly stable moderate chronic microvascular ischemic changes given differences in technique.  Right Sided Rib Pain - Improved Pain control with Norco.  - Likely 2/2 to Mechanical Fall - C/w Norco 1 tablet q6hprn - Continue to Monitor  DVT prophylaxis:  Anticoagulated with Xarelto Code Status: FULL Family Communication: No Family At Bedside. Called Sister and Updated Disposition Plan: Likely SNF vs. Home with Home Health  Consultants:  Cardiology Dr. [name] Pulmonology   Procedures: None  Antimicrobials: IV Cefepime and IV Vancomycin  Subjective: Patient was seen and examined at bedside this AM and she was transitioned to HFNC and her sats were maintaining. Went to A. Fib with RVR again so diltiazem gtt was started again. Spoke with patient's sister and unfortunately she was unable to make it for Palliative Care Meeting soo meeting will be held tomorrow. No other complaints from patient except that her back is itching.   Objective: Vitals:   02/04/16 1025 02/04/16 1123 02/04/16 1125 02/04/16 1442  BP:  (!) 123/58  (!) 109/57  Pulse: 95 96 80 79  Resp:  (!) 21  (!) 22  Temp:  97.8 F (36.6 C)  98.6 F (37 C)  TempSrc:  Oral  Oral  SpO2:  96%  97%  Weight:      Height:        Intake/Output Summary (Last 24 hours) at 02/04/16 1450 Last data filed at 02/04/16 1445  Gross per 24 hour  Intake              840 ml  Output             1150 ml  Net             -310 ml   Filed Weights   02/02/16 0248 02/03/16 0300 02/04/16 0246  Weight: 107.1 kg (236 lb 1.8 oz) 108.1 kg (238 lb 5.1 oz) 107.4 kg (236 lb 12.4 oz)    Examination: Physical Exam:  Constitutional: WN/WD, Mild Respiratory Distress Eyes:  lids and conjunctivae normal, sclerae anicteric  ENMT: External Ears, Nose  appear normal. Grossly normal hearing.  Neck: Appears normal, supple, no cervical masses, Respiratory: Diminished bilaterally with crackles. No appreciable rhonchi. Increased respiratory effort with slight tachypenia. Dyspenic on conversation.  Cardiovascular: RRR; No murmurs / rubs / gallops. S1 and S2 auscultated. Mild edema. Abdomen: Soft, non-tender, non-distended. No masses palpated. No appreciable hepatosplenomegaly. Bowel sounds positive x4.  GU: Foley Catheter in Place.  Musculoskeletal: No clubbing / cyanosis of digits/nails. No joint deformity upper and lower extremities. No contractures Skin: No rashes, lesions, ulcers on limited skin exam. No induration; Warm and dry. Back not erythematous where patient was itching.   Neurologic: CN 2-12 grossly intact with no focal deficits. Sensation intact in all 4 Extremities. Romberg sign cerebellar reflexes not assessed.  Psychiatric: Normal judgment and insight. Alert and oriented x 3. Anxious and depressed mood and flat affect.   Data Reviewed: I have personally reviewed following labs and imaging studies  CBC:  Recent Labs Lab 01/31/16 0448 02/01/16 0311 02/02/16 0306 02/03/16 0409 02/04/16 0342  WBC 9.7 16.8* 12.2* 10.0 10.7*  NEUTROABS  --  13.7* 8.3* 6.8 7.8*  HGB 9.1* 8.5* 10.0* 10.1* 10.2*  HCT 30.9* 28.4* 34.4* 35.0* 34.7*  MCV 90.9 89.9 92.2 93.3 92.5  PLT 244 281 278 256 276   Basic Metabolic Panel:  Recent Labs Lab 01/31/16 0140 01/31/16 0448 02/01/16 0311 02/02/16 0306 02/03/16 0409 02/04/16 0342  NA  --  135 134* 137 138 135  K  --  3.7 4.3 3.4* 4.0 4.4  CL  --  84* 81* 79* 82* 79*  CO2  --  41* 46* 49* 44* 48*  GLUCOSE  --  349* 190* 142* 171* 170*  BUN  --  18 20 17 18 20   CREATININE  --  1.40* 1.56* 1.36* 1.29* 1.35*  CALCIUM  --  9.0 8.5* 8.8* 9.0 8.9  MG 1.9  --  1.6* 1.5* 1.7 1.6*  PHOS  --   --  4.1 4.6 4.5 3.7   GFR: Estimated Creatinine Clearance: 48.9 mL/min (by C-G formula based on SCr of  1.35 mg/dL (H)). Liver Function Tests:  Recent Labs Lab 02/01/16 0311 02/03/16 0409 02/04/16 0342  AST 17 12* 10*  ALT 13* 13* 11*  ALKPHOS 36* 39 39  BILITOT 0.4 0.7 0.7  PROT 5.6* 5.8* 6.0*  ALBUMIN 2.8* 2.7* 2.7*   No results for input(s): LIPASE, AMYLASE in the last 168 hours. No results for input(s): AMMONIA in the last 168 hours. Coagulation Profile: No  results for input(s): INR, PROTIME in the last 168 hours. Cardiac Enzymes:  Recent Labs Lab 01/31/16 0140 01/31/16 0448 01/31/16 1103  TROPONINI 0.03* 0.03* <0.03   BNP (last 3 results) No results for input(s): PROBNP in the last 8760 hours. HbA1C: No results for input(s): HGBA1C in the last 72 hours. CBG:  Recent Labs Lab 02/03/16 1251 02/03/16 1709 02/03/16 2122 02/04/16 0733 02/04/16 1121  GLUCAP 151* 201* 260* 162* 234*   Lipid Profile: No results for input(s): CHOL, HDL, LDLCALC, TRIG, CHOLHDL, LDLDIRECT in the last 72 hours. Thyroid Function Tests: No results for input(s): TSH, T4TOTAL, FREET4, T3FREE, THYROIDAB in the last 72 hours. Anemia Panel: No results for input(s): VITAMINB12, FOLATE, FERRITIN, TIBC, IRON, RETICCTPCT in the last 72 hours. Sepsis Labs: No results for input(s): PROCALCITON, LATICACIDVEN in the last 168 hours.  Recent Results (from the past 240 hour(s))  MRSA PCR Screening     Status: None   Collection Time: 01/31/16  6:28 AM  Result Value Ref Range Status   MRSA by PCR NEGATIVE NEGATIVE Final    Comment:        The GeneXpert MRSA Assay (FDA approved for NASAL specimens only), is one component of a comprehensive MRSA colonization surveillance program. It is not intended to diagnose MRSA infection nor to guide or monitor treatment for MRSA infections.   Culture, blood (Routine X 2) w Reflex to ID Panel     Status: None (Preliminary result)   Collection Time: 02/01/16  7:41 AM  Result Value Ref Range Status   Specimen Description BLOOD RIGHT ANTECUBITAL  Final    Special Requests BOTTLES DRAWN AEROBIC AND ANAEROBIC 5CC  Final   Culture NO GROWTH 2 DAYS  Final   Report Status PENDING  Incomplete  Culture, blood (Routine X 2) w Reflex to ID Panel     Status: Abnormal   Collection Time: 02/01/16  7:43 AM  Result Value Ref Range Status   Specimen Description BLOOD LEFT ARM  Final   Special Requests IN PEDIATRIC BOTTLE 3CC  Final   Culture  Setup Time   Final    GRAM POSITIVE COCCI IN CLUSTERS AEROBIC BOTTLE ONLY Organism ID to follow CRITICAL RESULT CALLED TO, READ BACK BY AND VERIFIED WITH: A. Masters Pharm.D. 9:05 02/02/16  ([email or web address])    Culture (A)  Final    STAPHYLOCOCCUS SPECIES (COAGULASE NEGATIVE) THE SIGNIFICANCE OF ISOLATING THIS ORGANISM FROM A SINGLE SET OF BLOOD CULTURES WHEN MULTIPLE SETS ARE DRAWN IS UNCERTAIN. PLEASE NOTIFY THE MICROBIOLOGY DEPARTMENT WITHIN ONE WEEK IF SPECIATION AND SENSITIVITIES ARE REQUIRED.    Report Status 02/04/2016 FINAL  Final  Blood Culture ID Panel (Reflexed)     Status: Abnormal   Collection Time: 02/01/16  7:43 AM  Result Value Ref Range Status   Enterococcus species NOT DETECTED NOT DETECTED Final   Listeria monocytogenes NOT DETECTED NOT DETECTED Final   Staphylococcus species DETECTED (A) NOT DETECTED Final    Comment: CRITICAL RESULT CALLED TO, READ BACK BY AND VERIFIED WITH: A. Masters Pharm.D. 9:05 02/02/16 ([email or web address])    Staphylococcus aureus NOT DETECTED NOT DETECTED Final   Methicillin resistance DETECTED (A) NOT DETECTED Final    Comment: CRITICAL RESULT CALLED TO, READ BACK BY AND VERIFIED WITH: A. Masters Pharm.D. 9:05 02/02/16 ([email or web address])    Streptococcus species NOT DETECTED NOT DETECTED Final   Streptococcus agalactiae NOT DETECTED NOT DETECTED Final   Streptococcus pneumoniae NOT DETECTED NOT DETECTED Final   Streptococcus pyogenes NOT DETECTED NOT DETECTED Final  Acinetobacter baumannii NOT DETECTED NOT DETECTED Final   Enterobacteriaceae species NOT DETECTED NOT DETECTED Final    Enterobacter cloacae complex NOT DETECTED NOT DETECTED Final   Escherichia coli NOT DETECTED NOT DETECTED Final   Klebsiella oxytoca NOT DETECTED NOT DETECTED Final   Klebsiella pneumoniae NOT DETECTED NOT DETECTED Final   Proteus species NOT DETECTED NOT DETECTED Final   Serratia marcescens NOT DETECTED NOT DETECTED Final   Haemophilus influenzae NOT DETECTED NOT DETECTED Final   Neisseria meningitidis NOT DETECTED NOT DETECTED Final   Pseudomonas aeruginosa NOT DETECTED NOT DETECTED Final   Candida albicans NOT DETECTED NOT DETECTED Final   Candida glabrata NOT DETECTED NOT DETECTED Final   Candida krusei NOT DETECTED NOT DETECTED Final   Candida parapsilosis NOT DETECTED NOT DETECTED Final   Candida tropicalis NOT DETECTED NOT DETECTED Final    Radiology Studies: Dg Chest Port 1 View  Result Date: 02/02/2016 CLINICAL DATA:  Shortness of breath. EXAM: PORTABLE CHEST 1 VIEW COMPARISON:  Chest radiograph yesterday at 0626 hour FINDINGS: Mild cardiomegaly is unchanged. Left pleural effusion is unchanged. Unchanged pulmonary vasculature with vascular congestion, possible pulmonary edema. Increased bibasilar atelectasis. There are remote left rib fractures. IMPRESSION: Increased bibasilar atelectasis. Otherwise unchanged appearance of the chest suggesting CHF. Electronically Signed   By: Rubye Oaks M.D.   On: 02/02/2016 23:46   ECHOCARDIOGRAM:  Study Conclusions  - Left ventricle: The cavity size was normal. Systolic function was   vigorous. The estimated ejection fraction was in the range of 65%   to 70%. The study is not technically sufficient to allow   evaluation of LV diastolic function. - Mitral valve: Calcified annulus. - Right ventricle: The cavity size was mildly dilated. - Right atrium: The atrium was mildly dilated. - Inferior vena cava: The vessel was dilated. The respirophasic   diameter changes were blunted (< 50%), consistent with elevated   central venous  pressure.  Scheduled Meds: . amiodarone  400 mg Oral TID  . ceFEPime (MAXIPIME) IV  2 g Intravenous Q24H  . diltiazem  240 mg Oral Daily  . ferrous sulfate  325 mg Oral Q breakfast  . furosemide  20 mg Oral Daily  . insulin aspart  0-9 Units Subcutaneous TID WC  . ipratropium-albuterol  3 mL Nebulization Q6H  . lactobacillus acidophilus  1 tablet Oral Daily  . magnesium oxide  400 mg Oral BID  . metoprolol tartrate  25 mg Oral BID  . mometasone-formoterol  2 puff Inhalation BID  . pantoprazole  40 mg Oral Daily  . polyethylene glycol  17 g Oral Daily  . potassium chloride  20 mEq Oral Once  . rivaroxaban  20 mg Oral Q supper  . senna  1 tablet Oral Daily  . sodium chloride flush  3 mL Intravenous Q12H  . spironolactone  25 mg Oral Daily  . traZODone  50 mg Oral QHS  . vancomycin  1,250 mg Intravenous Q24H  . zolpidem  5 mg Oral Once   Continuous Infusions: . diltiazem (CARDIZEM) infusion Stopped (02/04/16 1030)    LOS: 5 days   Merlene Laughter, DO [facility] Pager [phone]  If 7PM-7AM, please contact night-coverage [email or web address] Password [email or web address] 02/04/2016, 2:50 PM

## 2016-02-04 NOTE — Progress Notes (Signed)
PULMONARY / CRITICAL CARE MEDICINE   Name: [name] MRN: [id number] DOB: 02-06-1946    ADMISSION DATE:  01/30/2016 CONSULTATION DATE:  02/03/16   REFERRING MD:  [facility]   CHIEF COMPLAINT: " I want to get off these machines"  HISTORY OF PRESENT ILLNESS:   70 yowf MO s/p smoking cessation around 2010 severely debilitated with sob across the room on home 02 around 4lpm last seen in pulmonary clinic in 2008 and did not return with no pfts in epic but carries dx of copd/ohs and admittted with acute on chronic hypercarbic resp failure and with previous eval by Tyson Alias in 01/06/16 and made DNR p brief ET "hated it" and readmit 01/30/16 with sob and PCCM asked to see am 10/15 with hypercapnea but refusing to continue bipap.    SUBJECTIVE:  Comfortable at 30 degrees hob able to speak in nearly full sentences on 02 facemask 4lpm with sats in mid 80s   VITAL SIGNS: BP (!) 123/58 (BP Location: Left Arm)   Pulse 96   Temp 97.8 F (36.6 C) (Oral)   Resp (!) 21   Ht 5\' 7"  (1.702 m)   Wt 107.4 kg (236 lb 12.4 oz)   SpO2 96%   BMI 37.08 kg/m    INTAKE / OUTPUT: I/O last 3 completed shifts: In: 903.3 [P.O.:600; I.V.:3.3; IV Piggyback:300] Out: 3200 [Urine:3200]  PHYSICAL EXAMINATION:  General: chronically ill appearing, NAD Neuro: drowsy but easily arousable, appropriate, MAE  CV: s1s2 rrr PULM: resps even non labored on [place], diminished throughout, no audible wheeze  GI: round, soft, non tender  Extremities: no edema     LABS:  BMET  Recent Labs Lab 02/02/16 0306 02/03/16 0409 02/04/16 0342  NA 137 138 135  K 3.4* 4.0 4.4  CL 79* 82* 79*  CO2 49* 44* 48*  BUN 17 18 20   CREATININE 1.36* 1.29* 1.35*  GLUCOSE 142* 171* 170*    Electrolytes  Recent Labs Lab 02/02/16 0306 02/03/16 0409 02/04/16 0342  CALCIUM 8.8* 9.0 8.9  MG 1.5* 1.7 1.6*  PHOS 4.6 4.5 3.7    CBC  Recent Labs Lab 02/02/16 0306 02/03/16 0409 02/04/16 0342  WBC 12.2* 10.0 10.7*  HGB 10.0*  10.1* 10.2*  HCT 34.4* 35.0* 34.7*  PLT 278 256 276    Coag's No results for input(s): APTT, INR in the last 168 hours.  Sepsis Markers No results for input(s): LATICACIDVEN, PROCALCITON, O2SATVEN in the last 168 hours.  ABG  Recent Labs Lab 02/02/16 2255 02/03/16 0310 02/03/16 0730  PHART 7.404 7.317* 7.343*  PCO2ART 73.7* 101* 94.9*  PO2ART 86.6 69.7* 59.9*    Liver Enzymes  Recent Labs Lab 02/01/16 0311 02/03/16 0409 02/04/16 0342  AST 17 12* 10*  ALT 13* 13* 11*  ALKPHOS 36* 39 39  BILITOT 0.4 0.7 0.7  ALBUMIN 2.8* 2.7* 2.7*    Cardiac Enzymes  Recent Labs Lab 01/31/16 0140 01/31/16 0448 01/31/16 1103  TROPONINI 0.03* 0.03* <0.03    Glucose  Recent Labs Lab 02/02/16 2215 02/03/16 0659 02/03/16 1251 02/03/16 1709 02/03/16 2122 02/04/16 0733  GLUCAP 232* 146* 151* 201* 260* 162*    Imaging No results found.   ASSESSMENT / PLAN:   Acute on chronic respiratory failure r/t end-stage COPD with OHS +/- OSA.   PLAN -  DNI  Palliative care meeting today  Titrate O2 as able to keep sats 88-90%.   Minimize sedating medications.  Continue BD.     Pt with severe chronic  respiratory failure and overall decline last few months. Pt wishes to be DNI status (which is appropriate given her underlying lung disease) and had initially agreed to bipap wanting to continue full medical care, but she is now refusing bipap.  Palliative care also working with pt.family.  Meeting scheduled for 10/16 to discuss further.  She would be appropriate for hospice care at home.    Dirk Dress, NP 02/04/2016  11:43 AM Pager: [phone] or [phone]   ATTENDING NOTE / ATTESTATION NOTE :   I have discussed the case with the resident/APP  [facility].   I agree with the resident/APP's  history, physical examination, assessment, and plans.    I have edited the above note and modified it according to our agreed history, physical examination,  assessment and plan.   Briefly, pt admitted with acute on chronic hypoxemic hypercapneic resp failure 2/2 OSA/OHS/COPD. Currently is a DNI, no Bipap. Currently on 15L HFNC and o2 sats 100%. Sedated now, comfortable. Not in distress. Some bibasilar crackles.   Try to cut down FiO2 to keep o2 sats 88-90%  Cont dulera, duoneb Cont diuresis Avoid sedating meds On abx (Vanc/cefepime) Noted family meeting in am to discuss goals of care.    Family :  No family at bedside.    Pollie Meyer, MD 02/04/2016, 5:29 PM [facility] Pager [phone] After 3 pm or if no answer, call [phone]

## 2016-02-04 NOTE — Consult Note (Signed)
   [facility] CM Inpatient Consult   02/04/2016  KHYLAH KENDRA [birth year] [id number]   [facility] Care Management follow up. Ms. Sonnen is active with [facility] Care Management services. Discussed with inpatient RNCM. Noted Palliative Medicine notes. Will continue to follow.  Raiford Noble, MSN-Ed, RN,BSN [address] [facility] Liaison [phone]

## 2016-02-04 NOTE — Progress Notes (Signed)
Patient's BP 131/64 when cardizem drip initiated. BP is currently 107/46. Dr. Marland Mcalpine notified who stated he wouldn't be concerned about stopping the drip until her SBP <100. Patient has continued to fluctuate between AFib and NSR.  Leanna Battles, RN.

## 2016-02-04 NOTE — Progress Notes (Signed)
Placed patient on high flow nasal cannula from partial rebreather, patient is tolerating it well and RT will continue to monitor as needed. Patient did not wish to wear BiPAP or CPAP this night.

## 2016-02-05 ENCOUNTER — Other Ambulatory Visit: Payer: Self-pay

## 2016-02-05 DIAGNOSIS — I420 Dilated cardiomyopathy: Secondary | ICD-10-CM

## 2016-02-05 DIAGNOSIS — Z66 Do not resuscitate: Secondary | ICD-10-CM

## 2016-02-05 DIAGNOSIS — J189 Pneumonia, unspecified organism: Secondary | ICD-10-CM

## 2016-02-05 DIAGNOSIS — N183 Chronic kidney disease, stage 3 unspecified: Secondary | ICD-10-CM

## 2016-02-05 DIAGNOSIS — I5031 Acute diastolic (congestive) heart failure: Secondary | ICD-10-CM

## 2016-02-05 DIAGNOSIS — G4733 Obstructive sleep apnea (adult) (pediatric): Secondary | ICD-10-CM

## 2016-02-05 DIAGNOSIS — I4891 Unspecified atrial fibrillation: Secondary | ICD-10-CM

## 2016-02-05 DIAGNOSIS — I5032 Chronic diastolic (congestive) heart failure: Secondary | ICD-10-CM

## 2016-02-05 LAB — GLUCOSE, CAPILLARY
GLUCOSE-CAPILLARY: 146 mg/dL — AB (ref 65–99)
GLUCOSE-CAPILLARY: 247 mg/dL — AB (ref 65–99)
GLUCOSE-CAPILLARY: 151 mg/dL — AB (ref 65–99)
GLUCOSE-CAPILLARY: 320 mg/dL — AB (ref 65–99)

## 2016-02-05 LAB — CBC WITH DIFFERENTIAL/PLATELET
BASOS PCT: 0 %
Basophils Absolute: 0 10*3/uL (ref 0.0–0.1)
EOS ABS: 0.4 10*3/uL (ref 0.0–0.7)
Eosinophils Relative: 3 %
HEMATOCRIT: 31.2 % — AB (ref 36.0–46.0)
HEMOGLOBIN: 9.3 g/dL — AB (ref 12.0–15.0)
LYMPHS ABS: 2.1 10*3/uL (ref 0.7–4.0)
Lymphocytes Relative: 18 %
MCH: 27 pg (ref 26.0–34.0)
MCHC: 29.8 g/dL — ABNORMAL LOW (ref 30.0–36.0)
MCV: 90.7 fL (ref 78.0–100.0)
Monocytes Absolute: 1 10*3/uL (ref 0.1–1.0)
Monocytes Relative: 8 %
NEUTROS ABS: 8.4 10*3/uL — AB (ref 1.7–7.7)
NEUTROS PCT: 71 %
Platelets: 257 10*3/uL (ref 150–400)
RBC: 3.44 MIL/uL — AB (ref 3.87–5.11)
RDW: 17 % — ABNORMAL HIGH (ref 11.5–15.5)
WBC: 11.9 10*3/uL — AB (ref 4.0–10.5)

## 2016-02-05 LAB — COMPREHENSIVE METABOLIC PANEL
ALBUMIN: 2.5 g/dL — AB (ref 3.5–5.0)
ALK PHOS: 38 U/L (ref 38–126)
ALT: 10 U/L — AB (ref 14–54)
AST: 11 U/L — ABNORMAL LOW (ref 15–41)
Anion gap: 8 (ref 5–15)
BUN: 20 mg/dL (ref 6–20)
CALCIUM: 8.7 mg/dL — AB (ref 8.9–10.3)
CO2: 47 mmol/L — AB (ref 22–32)
CREATININE: 1.44 mg/dL — AB (ref 0.44–1.00)
Chloride: 78 mmol/L — ABNORMAL LOW (ref 101–111)
GFR calc Af Amer: 42 mL/min — ABNORMAL LOW (ref >60)
GFR calc non Af Amer: 36 mL/min — ABNORMAL LOW (ref >60)
GLUCOSE: 151 mg/dL — AB (ref 65–99)
Potassium: 4.2 mmol/L (ref 3.5–5.1)
SODIUM: 133 mmol/L — AB (ref 135–145)
Total Bilirubin: 0.6 mg/dL (ref 0.3–1.2)
Total Protein: 5.6 g/dL — ABNORMAL LOW (ref 6.5–8.1)

## 2016-02-05 LAB — VANCOMYCIN, TROUGH: VANCOMYCIN TR: 27 ug/mL — AB (ref 15–20)

## 2016-02-05 LAB — MAGNESIUM: Magnesium: 1.6 mg/dL — ABNORMAL LOW (ref 1.7–2.4)

## 2016-02-05 LAB — PHOSPHORUS: Phosphorus: 3.1 mg/dL (ref 2.5–4.6)

## 2016-02-05 MED ORDER — LEVALBUTEROL HCL 1.25 MG/0.5ML IN NEBU
1.2500 mg | INHALATION_SOLUTION | Freq: Four times a day (QID) | RESPIRATORY_TRACT | Status: DC
  Administered 2016-02-05 – 2016-02-07 (×8): 1.25 mg via RESPIRATORY_TRACT
  Filled 2016-02-05 (×8): qty 0.5

## 2016-02-05 MED ORDER — METHYLPREDNISOLONE SODIUM SUCC 125 MG IJ SOLR
60.0000 mg | INTRAMUSCULAR | Status: DC
  Administered 2016-02-05: 60 mg via INTRAVENOUS
  Filled 2016-02-05: qty 2

## 2016-02-05 MED ORDER — LEVALBUTEROL HCL 0.63 MG/3ML IN NEBU: 0.6300 mg | INHALATION_SOLUTION | RESPIRATORY_TRACT | Status: DC | PRN

## 2016-02-05 MED ORDER — TRAZODONE HCL 50 MG PO TABS
50.0000 mg | ORAL_TABLET | Freq: Every day | ORAL | Status: DC
  Administered 2016-02-05: 50 mg via ORAL
  Administered 2016-02-06 – 2016-02-07 (×2): 100 mg via ORAL
  Filled 2016-02-05 (×2): qty 2
  Filled 2016-02-05: qty 1

## 2016-02-05 MED ORDER — BUDESONIDE 0.5 MG/2ML IN SUSP
0.5000 mg | Freq: Two times a day (BID) | RESPIRATORY_TRACT | Status: DC
Start: 2016-02-05 — End: 2016-02-08
  Administered 2016-02-05 – 2016-02-08 (×8): 0.5 mg via RESPIRATORY_TRACT
  Filled 2016-02-05 (×8): qty 2

## 2016-02-05 NOTE — Progress Notes (Signed)
CRITICAL VALUE ALERT  Critical value received:  Vancomycin level 27  Date of notification:  02/05/2016  Time of notification:  2114  Critical value read back: Yes  Nurse who received alert:  Collier Salina   MD notified (1st page):  Dr. Craige Cotta   Time of first page:  2118  MD notified (2nd page):  Time of second page:  Responding MD:  Dr. Craige Cotta  Time MD responded:  2120

## 2016-02-05 NOTE — Progress Notes (Addendum)
Pharmacy Antibiotic Note  [name] is a 70 y.o. female admitted on 01/30/2016 with pneumonia.  Pharmacy has been consulted for vancomycin and cefepime dosing. 1 of 2 blood cultures positive for methicillin-resistant staph species (not MRSA). Noted family decision for DNR and to return home with the assistance of hospice -vancomycin level= 27 at 8pm (last dose given 10/16 at ~ 8pm)  --antibiotics have been discontinued  Plan: -Antibiotics discontinued per MD  Height: 5\' 7"  (170.2 cm) Weight: 236 lb 15.9 oz (107.5 kg) IBW/kg (Calculated) : 61.6  Temp (24hrs), Avg:98.7 F (37.1 C), Min:97.9 F (36.6 C), Max:99.2 F (37.3 C)   Recent Labs Lab 02/01/16 0311 02/02/16 0306 02/03/16 0409 02/04/16 0342 02/05/16 0320 02/05/16 2008  WBC 16.8* 12.2* 10.0 10.7* 11.9*  --   CREATININE 1.56* 1.36* 1.29* 1.35* 1.44*  --   VANCOTROUGH  --   --   --   --   --  27*    Estimated Creatinine Clearance: 45.9 mL/min (by C-G formula based on SCr of 1.44 mg/dL (H)).   Clearance appears stable  Allergies  Allergen Reactions  . Gold-Containing Drug Products Other (See Comments)    Blisters   . Tape Dermatitis    Antimicrobials this admission:  Vanc 10/11 > Cefepime 10/11 >>  Dose adjustments this admission:  -vancomycin level= 27; change to 1000mg  IV q24h  Microbiology results:  10/13 BCx: BCID + staph spp w/ methicillin resistance 10/12 mrsa pcr: neg  Thank you for allowing pharmacy to be a part of this patient's care.  11/13, Pharm D 02/05/2016 9:29 PM

## 2016-02-05 NOTE — Progress Notes (Signed)
PROGRESS NOTE    [name]  MEQ:[id number] DOB: 12-02-1945 DOA: 01/30/2016 PCP: [name], MD   Brief Narrative:  70 y.o. female admitted 3 weeks ago for respiratory failure and encephalopathy with known hx of sleep apnea non compliant with CPAP, chronic afib, COPD, and DM, presents with worsening shortness of breath. Patient states she has chronic shortness of breath which had acutely worsened today with increasing lower extremity edema. and chest tightness. Early in the morning at her house, patient also had a fall and hit her head but did not lose consciousness after she lost her balance. Patient still has mild occipital headache, CT head is pending. CXR shows congestion and mild pleural effusion with possible infiltrates. Patient on exam, is mildly short of breath but able to complete sentences. JVD is elevated with bilateral lower extremity edema and mild wheezing. Since CXR was showing possible infiltrates patient was started on antibiotics for pneumonia . Patient also has clinical features consistent with CHF and has been given lasix 40mg  and placed on oxygen mask. Patient was admitted for further managemt of acue respiratory failure possibly a combination of CHF and COPD with possible pneumonia.   ED Course: chloride 85, Cr 1.38, glucose 301, WBC 14.9, hgb 9.6 see hix of present illness.  While in the ED, she was started on IV antibiotics cefepime and vancomycin, lasix 40mg , and nebs.     Subjective: 10/17 A/O 4, states was not using CPAP prior to this admission. Was on 4 L O2 via [place] 24 hours a day. At one point was supposed to use CPAP but was unable to tolerate mask secondary to anxiety. Sister was present who is her HCPOA and verifies story. States unknown base weight. Does not weigh self daily. States weighs herself at least weekly when home RN comes into see her. In the last 24 hours afebrile, negative leukocytosis, negative bands, negative left shift    Assessment &  Plan:   Principal Problem:   Acute on chronic respiratory failure with hypoxia and hypercapnia (HCC) Active Problems:   Diabetes mellitus type II, uncontrolled (HCC)   COPD (chronic obstructive pulmonary disease) (HCC)   OSA (obstructive sleep apnea)   Iron deficiency anemia   Acute on chronic respiratory failure with hypoxia (HCC)   Hypertension   COPD exacerbation (HCC)   Type 2 diabetes mellitus without complication, without long-term current use of insulin (HCC)   Acute respiratory failure (HCC)   CHF (congestive heart failure) (HCC)   Palliative care encounter   Fall   SOB (shortness of breath)   Acute on Chronic Hypercapnic Hypoxic Respiratory Failure likley 2/2 Acute Exacerbation of CHF with likely concomitant COPD and ? HCAP  -Pulmonology Consulted yesterday because of Respiratory Distress and was placed on BiPAP 20/8. Now Transitioned on to HFNC; Per Pumonology attempt to Wean -Discussed plan with Patient and she stated she did not want any more BiPAP and did not want to be Intubated. Code Status Changed to DNI.  -Continue to Monitor O2 Sats; -Palliative Care Consulted for additional Recc's -Spoke with sister and patient awaiting Palliative Care recommendations. -Not totally convinced patient's large increase in O2 demand fully explained by COPD exacerbation/HCAP, obtaining VQ scan, Patient lives a sedentary life  -Completed course of antibiotics.  Supected Acute Decompensation of Chronic Diastolic of CHF with EF of 60-65%/Cardiomyopathy -Repeat ECHOcardiogram; Systolic Function was vigorous with Estimated EF of 65-70%. The study is not technically sufficient to allowevaluation of LV diastolic function -Daily Weights [employer]  02/03/16 0300 02/04/16 0246 02/05/16 0424  Weight: 108.1 kg (238 lb 5.1 oz) 107.4 kg (236 lb 12.4 oz) 107.5 kg (236 lb 15.9 oz)  , Strict I's and O's Since admission -5.5 L  -Cardiac Troponins Negative x3 -Patient with extremely poor  respiratory status. Currently in NSR will try to titrate Amiodarone off. -Decrease Amiodarone 400 mg BID -Cardizem 240 mg daily -Lasix 20 mg daily -Metoprolol 25 mg BID -Spironolactone 25 mg daily  COPD Exacerbation/HCAP  -CXR showed Ill-defined opacities in the lung bases could relate to atelectasis or infiltrate. Tiny bilateral effusions. -Completed 5 day course antibiotics.  -DC DuoNeb secondary to A. fib -Xopenex QID -Repeat CXR showed Bibasilar Subsegmental Atelectasis.  -Solu-Medrol 60 mg daily  Paroxysmal Atrial Fibrillation with RVR (hx cardioversion 09/2015) s/p Spontaneous Conversion to NSR(Chads2vasc score is 4. ) - See COPD exacerbation.  - Patientis on Xarelto for Anticoagulation.   Blood Culture Contiamination? -1/2 Vials had Methicillin Resistant Staph Species not MRSA -DC antibiotics  DM type 2 - Blood Sugar on BMP was 171 - Will be on Sensitivesliding scale coverage.    CKD stage III.(Baseline BUN/Cr was  17/1.36) Lab Results  Component Value Date   CREATININE 1.44 (H) 02/05/2016   CREATININE 1.35 (H) 02/04/2016   CREATININE 1.29 (H) 02/03/2016    Chronic Iron deficiency Anemia - On iron replacement with Ferrous Sulfate 325 mg po Daily.  - Hemoglobin went from 9.1/30.9 -> 8.5/28.4 -> 10.0/34.4 -> 10.2/34.7 - Repeat CBC in AM with Diff  OSA.  -Noncompliant with CPAP secondary to anxiety with mask.  -Did wear mask on 10/16.   -Increase trazodone 50-100 mg PO PRN QHS  -Will require outpatient sleep study   Mechanical Fall. - Patient has a history of brain aneurysm with coiling.  - CT head showed Left internal carotid artery terminus and basilar coil masses with extensive streak artifact obscuring the brain at those levels. Within the visualized brain no acute abnormality is identified. Grossly stable moderate chronic microvascular ischemic changes given differences in technique.  Right Sided Rib Pain - Improved Pain control with Norco.  -  Likely 2/2 to Mechanical Fall - C/w Norco 1 tablet q6hprn - Continue to Monitor   DVT prophylaxis: Xarelto Code Status: DO NOT RESUSCITATE Family Communication: Sister present Disposition Plan: Resolution acute respiratory failure   Consultants:  Dr Vonna Kotyk. Jacinto Halim,  cardiology NP Ulice Bold Palliative care    Procedures/Significant Events:  10/12 echocardiogram:LVEF= 65% to 70%.- Mitral valve: Calcified annulus. - Right ventricle: mildly dilated.- Right atrium: mildly dilated. - Inferior vena cava: The vessel was dilated. The respirophasic diameter changes were blunted (< 50%), consistent with elevated central venous pressure.  Cultures 10/12 MRSA by PCR negative 10/13 blood right AC NGTD 10/13 blood left arm positive coag negative staph (most likely contaminant)    Antimicrobials: Cefepime 10/11>> 10/17 Vancomycin 10/11>> 10/17   Devices None   LINES / TUBES:  None    Continuous Infusions: . sodium chloride 10 mL/hr at 02/05/16 1249  . diltiazem (CARDIZEM) infusion Stopped (02/05/16 1000)     Objective: Vitals:   02/05/16 0912 02/05/16 0916 02/05/16 1017 02/05/16 1223  BP:   (!) 122/57 103/60  Pulse:   (!) 102 87  Resp:    (!) 22  Temp:    99.2 F (37.3 C)  TempSrc:    Oral  SpO2: 93% 93%  (!) 82%  Weight:      Height:        Intake/Output Summary (Last  24 hours) at 02/05/16 1322 Last data filed at 02/05/16 1100  Gross per 24 hour  Intake            275.5 ml  Output             1330 ml  Net          -1054.5 ml   Filed Weights   02/03/16 0300 02/04/16 0246 02/05/16 0424  Weight: 108.1 kg (238 lb 5.1 oz) 107.4 kg (236 lb 12.4 oz) 107.5 kg (236 lb 15.9 oz)    Examination:  General: A/O 4, positive acute on chronic respiratory distress (on 10 L O2 via [place] to maintain adequate SPO2:  Eyes: negative scleral hemorrhage, negative anisocoria, negative icterus ENT: Negative Runny nose, negative gingival bleeding, Neck:  Negative scars, masses,  torticollis, lymphadenopathy, JVD Lungs: poor to no air movement all lung fields, bibasilar crackles, negative expiratory wheezing.  Cardiovascular:  Tachycardic,Regular rhythm without murmur gallop or rub normal S1 and S2 Abdomen: morbidly obese, negative abdominal pain, nondistended, positive soft, bowel sounds, no rebound, no ascites, no appreciable mass Extremities:  bilateral lower extremity edema 1+ (per sister significantly improved), left first metatarsal erythematous warm to touch (consistent with RA flare). Skin: Negative rashes, lesions, ulcers Psychiatric:  Negative depression, negative anxiety, negative fatigue, negative mania  Central nervous system:  Cranial nerves II through XII intact, tongue/uvula midline, all extremities muscle strength 5/5, sensation intact throughout, negative dysarthria, negative expressive aphasia, negative receptive aphasia.  .     Data Reviewed: Care during the described time interval was provided by me .  I have reviewed this patient's available data, including medical history, events of note, physical examination, and all test results as part of my evaluation. I have personally reviewed and interpreted all radiology studies.  CBC:  Recent Labs Lab 02/01/16 0311 02/02/16 0306 02/03/16 0409 02/04/16 0342 02/05/16 0320  WBC 16.8* 12.2* 10.0 10.7* 11.9*  NEUTROABS 13.7* 8.3* 6.8 7.8* 8.4*  HGB 8.5* 10.0* 10.1* 10.2* 9.3*  HCT 28.4* 34.4* 35.0* 34.7* 31.2*  MCV 89.9 92.2 93.3 92.5 90.7  PLT 281 278 256 276 257   Basic Metabolic Panel:  Recent Labs Lab 02/01/16 0311 02/02/16 0306 02/03/16 0409 02/04/16 0342 02/05/16 0320  NA 134* 137 138 135 133*  K 4.3 3.4* 4.0 4.4 4.2  CL 81* 79* 82* 79* 78*  CO2 46* 49* 44* 48* 47*  GLUCOSE 190* 142* 171* 170* 151*  BUN 20 17 18 20 20   CREATININE 1.56* 1.36* 1.29* 1.35* 1.44*  CALCIUM 8.5* 8.8* 9.0 8.9 8.7*  MG 1.6* 1.5* 1.7 1.6* 1.6*  PHOS 4.1 4.6 4.5 3.7 3.1   GFR: Estimated Creatinine  Clearance: 45.9 mL/min (by C-G formula based on SCr of 1.44 mg/dL (H)). Liver Function Tests:  Recent Labs Lab 02/01/16 0311 02/03/16 0409 02/04/16 0342 02/05/16 0320  AST 17 12* 10* 11*  ALT 13* 13* 11* 10*  ALKPHOS 36* 39 39 38  BILITOT 0.4 0.7 0.7 0.6  PROT 5.6* 5.8* 6.0* 5.6*  ALBUMIN 2.8* 2.7* 2.7* 2.5*   No results for input(s): LIPASE, AMYLASE in the last 168 hours. No results for input(s): AMMONIA in the last 168 hours. Coagulation Profile: No results for input(s): INR, PROTIME in the last 168 hours. Cardiac Enzymes:  Recent Labs Lab 01/31/16 0140 01/31/16 0448 01/31/16 1103  TROPONINI 0.03* 0.03* <0.03   BNP (last 3 results) No results for input(s): PROBNP in the last 8760 hours. HbA1C: No results for input(s): HGBA1C in the  last 72 hours. CBG:  Recent Labs Lab 02/04/16 1121 02/04/16 1802 02/04/16 2058 02/05/16 0756 02/05/16 1219  GLUCAP 234* 134* 290* 146* 247*   Lipid Profile: No results for input(s): CHOL, HDL, LDLCALC, TRIG, CHOLHDL, LDLDIRECT in the last 72 hours. Thyroid Function Tests: No results for input(s): TSH, T4TOTAL, FREET4, T3FREE, THYROIDAB in the last 72 hours. Anemia Panel: No results for input(s): VITAMINB12, FOLATE, FERRITIN, TIBC, IRON, RETICCTPCT in the last 72 hours. Urine analysis:    Component Value Date/Time   COLORURINE YELLOW 01/06/2016 1047   APPEARANCEUR CLEAR 01/06/2016 1047   LABSPEC 1.013 01/06/2016 1047   PHURINE 5.0 01/06/2016 1047   GLUCOSEU 250 (A) 01/06/2016 1047   HGBUR NEGATIVE 01/06/2016 1047   BILIRUBINUR NEGATIVE 01/06/2016 1047   KETONESUR NEGATIVE 01/06/2016 1047   PROTEINUR NEGATIVE 01/06/2016 1047   UROBILINOGEN 0.2 02/06/2015 1906   NITRITE NEGATIVE 01/06/2016 1047   LEUKOCYTESUR NEGATIVE 01/06/2016 1047   Sepsis Labs: @LABRCNTIP (procalcitonin:4,lacticidven:4)  ) Recent Results (from the past 240 hour(s))  MRSA PCR Screening     Status: None   Collection Time: 01/31/16  6:28 AM  Result  Value Ref Range Status   MRSA by PCR NEGATIVE NEGATIVE Final    Comment:        The GeneXpert MRSA Assay (FDA approved for NASAL specimens only), is one component of a comprehensive MRSA colonization surveillance program. It is not intended to diagnose MRSA infection nor to guide or monitor treatment for MRSA infections.   Culture, blood (Routine X 2) w Reflex to ID Panel     Status: None (Preliminary result)   Collection Time: 02/01/16  7:41 AM  Result Value Ref Range Status   Specimen Description BLOOD RIGHT ANTECUBITAL  Final   Special Requests BOTTLES DRAWN AEROBIC AND ANAEROBIC 5CC  Final   Culture NO GROWTH 3 DAYS  Final   Report Status PENDING  Incomplete  Culture, blood (Routine X 2) w Reflex to ID Panel     Status: Abnormal   Collection Time: 02/01/16  7:43 AM  Result Value Ref Range Status   Specimen Description BLOOD LEFT ARM  Final   Special Requests IN PEDIATRIC BOTTLE 3CC  Final   Culture  Setup Time   Final    GRAM POSITIVE COCCI IN CLUSTERS AEROBIC BOTTLE ONLY Organism ID to follow CRITICAL RESULT CALLED TO, READ BACK BY AND VERIFIED WITH: A. Masters Pharm.D. 9:05 02/02/16  ([email or web address])    Culture (A)  Final    STAPHYLOCOCCUS SPECIES (COAGULASE NEGATIVE) THE SIGNIFICANCE OF ISOLATING THIS ORGANISM FROM A SINGLE SET OF BLOOD CULTURES WHEN MULTIPLE SETS ARE DRAWN IS UNCERTAIN. PLEASE NOTIFY THE MICROBIOLOGY DEPARTMENT WITHIN ONE WEEK IF SPECIATION AND SENSITIVITIES ARE REQUIRED.    Report Status 02/04/2016 FINAL  Final  Blood Culture ID Panel (Reflexed)     Status: Abnormal   Collection Time: 02/01/16  7:43 AM  Result Value Ref Range Status   Enterococcus species NOT DETECTED NOT DETECTED Final   Listeria monocytogenes NOT DETECTED NOT DETECTED Final   Staphylococcus species DETECTED (A) NOT DETECTED Final    Comment: CRITICAL RESULT CALLED TO, READ BACK BY AND VERIFIED WITH: A. Masters Pharm.D. 9:05 02/02/16 ([email or web address])    Staphylococcus aureus NOT DETECTED NOT  DETECTED Final   Methicillin resistance DETECTED (A) NOT DETECTED Final    Comment: CRITICAL RESULT CALLED TO, READ BACK BY AND VERIFIED WITH: A. Masters Pharm.D. 9:05 02/02/16 ([email or web address])    Streptococcus species NOT DETECTED NOT DETECTED Final   Streptococcus agalactiae  NOT DETECTED NOT DETECTED Final   Streptococcus pneumoniae NOT DETECTED NOT DETECTED Final   Streptococcus pyogenes NOT DETECTED NOT DETECTED Final   Acinetobacter baumannii NOT DETECTED NOT DETECTED Final   Enterobacteriaceae species NOT DETECTED NOT DETECTED Final   Enterobacter cloacae complex NOT DETECTED NOT DETECTED Final   Escherichia coli NOT DETECTED NOT DETECTED Final   Klebsiella oxytoca NOT DETECTED NOT DETECTED Final   Klebsiella pneumoniae NOT DETECTED NOT DETECTED Final   Proteus species NOT DETECTED NOT DETECTED Final   Serratia marcescens NOT DETECTED NOT DETECTED Final   Haemophilus influenzae NOT DETECTED NOT DETECTED Final   Neisseria meningitidis NOT DETECTED NOT DETECTED Final   Pseudomonas aeruginosa NOT DETECTED NOT DETECTED Final   Candida albicans NOT DETECTED NOT DETECTED Final   Candida glabrata NOT DETECTED NOT DETECTED Final   Candida krusei NOT DETECTED NOT DETECTED Final   Candida parapsilosis NOT DETECTED NOT DETECTED Final   Candida tropicalis NOT DETECTED NOT DETECTED Final         Radiology Studies: No results found.      Scheduled Meds: . amiodarone  400 mg Oral TID  . budesonide (PULMICORT) nebulizer solution  0.5 mg Nebulization BID  . ceFEPime (MAXIPIME) IV  2 g Intravenous Q24H  . diltiazem  240 mg Oral Daily  . ferrous sulfate  325 mg Oral Q breakfast  . furosemide  20 mg Intravenous Daily  . insulin aspart  0-9 Units Subcutaneous TID WC  . ipratropium-albuterol  3 mL Nebulization Q6H  . lactobacillus acidophilus  1 tablet Oral Daily  . magnesium oxide  400 mg Oral BID  . metoprolol tartrate  25 mg Oral BID  . pantoprazole  40 mg Oral Daily  . polyethylene  glycol  17 g Oral Daily  . potassium chloride  20 mEq Oral Once  . rivaroxaban  20 mg Oral Q supper  . senna  1 tablet Oral Daily  . sodium chloride flush  3 mL Intravenous Q12H  . spironolactone  25 mg Oral Daily  . traZODone  50 mg Oral QHS  . vancomycin  1,250 mg Intravenous Q24H  . zolpidem  5 mg Oral Once   Continuous Infusions: . sodium chloride 10 mL/hr at 02/05/16 1249  . diltiazem (CARDIZEM) infusion Stopped (02/05/16 1000)     LOS: 6 days    Time spent: 40 minutes    Delon Revelo, Roselind Messier, MD [facility] Pager [phone]   If 7PM-7AM, please contact night-coverage [email or web address] Password [email or web address] 02/05/2016, 1:22 PM

## 2016-02-05 NOTE — Progress Notes (Addendum)
Pharmacy Antibiotic Note  [name] is a 70 y.o. female admitted on 01/30/2016 with pneumonia.  Pharmacy has been consulted for vancomycin and cefepime dosing. 1 of 2 blood cultures positive for methicillin-resistant staph species (not MRSA). WBC 11, afebrile, SCr trending up 1.44. Per MD, continue vancomycin and cefepime for now, will reassess and consider narrowing next 48 hours.  Given that patient has received 5 doses of vancomycin will check level prior to dose tonight unless vancomycin is stopped after palliative care meeting  Plan: -Vancomycin 1250 mg IV q24h -Cefepime 2g IV q24h -Monitor clinical course and renal function -Narrow as appropriate -Vancomycin trough tonight  Height: 5\' 7"  (170.2 cm) Weight: 236 lb 15.9 oz (107.5 kg) IBW/kg (Calculated) : 61.6  Temp (24hrs), Avg:98.8 F (37.1 C), Min:97.9 F (36.6 C), Max:99.5 F (37.5 C)   Recent Labs Lab 02/01/16 0311 02/02/16 0306 02/03/16 0409 02/04/16 0342 02/05/16 0320  WBC 16.8* 12.2* 10.0 10.7* 11.9*  CREATININE 1.56* 1.36* 1.29* 1.35* 1.44*    Estimated Creatinine Clearance: 45.9 mL/min (by C-G formula based on SCr of 1.44 mg/dL (H)).   Clearance appears stable  Allergies  Allergen Reactions  . Gold-Containing Drug Products Other (See Comments)    Blisters   . Tape Dermatitis    Antimicrobials this admission:  Vanc 10/11 > Cefepime 10/11 >>  Dose adjustments this admission:  10/17 Vancomycin trough  Microbiology results:  10/13 BCx: BCID + staph spp w/ methicillin resistance 10/12 mrsa pcr: neg  Thank you for allowing pharmacy to be a part of this patient's care.  12/12 PharmD., BCPS Clinical Pharmacist Pager [phone] 02/05/2016 1:39 PM

## 2016-02-05 NOTE — Progress Notes (Addendum)
Daily Progress Note   Patient Name: [name]       Date: 02/05/2016 DOB: Jul 20, 1945  Age: 70 y.o. MRN#: [id number] Attending Physician: Allie Bossier, MD Primary Care Physician: Kandice Hams, MD Admit Date: 01/30/2016  Reason for Consultation/Follow-up: Establishing goals of care  Subjective: I met today with [name] and her sister, [name] (retired Therapist, sports). [name] has been here for the past 2 months helping to care for [facility] as her health has declined. [name] did wear her BiPAP last night and seemed to tolerate. Discussed difference in palliative vs hospice. After much discussion they have decided: DNR and to return home with the assistance of hospice.   They are hopeful for some quality time in which I am hoping hospice can help with but also know that she will gradually get worse and could have sudden decline as well in which hospice could assist with a focus on comfort at the end of life. In the mean time they would like to treat the treatable and optimize her health and functional status to return home - this is her goal. [name] says that she is here "until the end" (she lives in [place]) so she has good support at home.   Her CO2 is quite high but she is compensated very well but remains confused at times but fairly alert and oriented for the most part (especially for expectation with CO2 95). Given this and her overall functional decline and frequent admissions I do believe she would be a good candidate for hospice.   Length of Stay: 6  Current Medications: Scheduled Meds:  . amiodarone  400 mg Oral TID  . budesonide (PULMICORT) nebulizer solution  0.5 mg Nebulization BID  . ceFEPime (MAXIPIME) IV  2 g Intravenous Q24H  . diltiazem  240 mg Oral Daily  . ferrous sulfate  325 mg Oral Q  breakfast  . furosemide  20 mg Intravenous Daily  . insulin aspart  0-9 Units Subcutaneous TID WC  . lactobacillus acidophilus  1 tablet Oral Daily  . levalbuterol  1.25 mg Nebulization Q6H  . magnesium oxide  400 mg Oral BID  . methylPREDNISolone (SOLU-MEDROL) injection  60 mg Intravenous Q24H  . metoprolol tartrate  25 mg Oral BID  . pantoprazole  40 mg Oral Daily  . polyethylene glycol  17  g Oral Daily  . potassium chloride  20 mEq Oral Once  . rivaroxaban  20 mg Oral Q supper  . senna  1 tablet Oral Daily  . sodium chloride flush  3 mL Intravenous Q12H  . spironolactone  25 mg Oral Daily  . traZODone  50-100 mg Oral QHS  . vancomycin  1,250 mg Intravenous Q24H    Continuous Infusions: . sodium chloride 10 mL/hr at 02/05/16 1400    PRN Meds: acetaminophen **OR** acetaminophen, diphenhydrAMINE, HYDROcodone-acetaminophen, LORazepam, metoprolol, ondansetron **OR** ondansetron (ZOFRAN) IV  Physical Exam  Constitutional: She is oriented to person, place, and time. She appears well-developed and well-nourished.  HENT:  Head: Normocephalic and atraumatic.  Cardiovascular: Normal rate.  An irregularly irregular rhythm present.  Pulmonary/Chest: No accessory muscle usage. No tachypnea. No respiratory distress.  Becomes mild-moderately SOB with little activity even in the bed and eating a meal.   Abdominal: Soft. Normal appearance.  Neurological: She is alert and oriented to person, place, and time.  Mostly oriented and aware of situation although moments of confusion.   Nursing note and vitals reviewed.           Vital Signs: BP 103/60 (BP Location: Right Arm)   Pulse 87   Temp 99.2 F (37.3 C) (Oral)   Resp (!) 22   Ht 5' 7" (1.702 m)   Wt 107.5 kg (236 lb 15.9 oz)   SpO2 95%   BMI 37.12 kg/m  SpO2: SpO2: 95 % O2 Device: O2 Device: High Flow Nasal Cannula O2 Flow Rate: O2 Flow Rate (L/min): 8 L/min (per sats)  Intake/output summary:  Intake/Output Summary (Last 24  hours) at 02/05/16 1609 Last data filed at 02/05/16 1400  Gross per 24 hour  Intake           287.33 ml  Output             1130 ml  Net          -842.67 ml   LBM: Last BM Date:  (PTA, per patient) Baseline Weight: Weight: 108.9 kg (240 lb) Most recent weight: Weight: 107.5 kg (236 lb 15.9 oz)       Palliative Assessment/Data: PPS: 30%   Flowsheet Rows   Flowsheet Row Most Recent Value  Intake Tab  Referral Department  Hospitalist  Unit at Time of Referral  Intermediate Care Unit  Palliative Care Primary Diagnosis  Pulmonary  Date Notified  02/03/16  Palliative Care Type  New Palliative care  Reason for referral  Non-pain Symptom, Clarify Goals of Care, Counsel Regarding Hospice, Psychosocial or Spiritual support  Date of Admission  01/30/16  Date first seen by Palliative Care  02/03/16  # of days Palliative referral response time  0 Day(s)  # of days IP prior to Palliative referral  4  Clinical Assessment  Palliative Performance Scale Score  40%  Pain Max last 24 hours  0  Pain Min Last 24 hours  0  Dyspnea Max Last 24 Hours  8  Dyspnea Min Last 24 hours  3  Nausea Max Last 24 Hours  0  Nausea Min Last 24 Hours  0  Anxiety Min Last 24 Hours  0  Psychosocial & Spiritual Assessment  Palliative Care Outcomes  Patient/Family meeting held?  Yes  Who was at the meeting?  pt  Palliative Care Outcomes  Provided psychosocial or spiritual support, Counseled regarding hospice  Palliative Care follow-up planned  Yes, Facility      Patient Active Problem  List   Diagnosis Date Noted  . CHF (congestive heart failure) (HCC)   . Palliative care encounter   . Fall   . SOB (shortness of breath)   . Acute respiratory failure (HCC) 01/30/2016  . Chronic obstructive pulmonary disease with acute exacerbation (HCC)   . Type 2 diabetes mellitus without complication, without long-term current use of insulin (HCC)   . Chronic atrial fibrillation (HCC)   . Muscle weakness (generalized)    . Diarrhea   . Difficulty in walking, not elsewhere classified   . Acute on chronic respiratory failure with hypoxia and hypercapnia (HCC) 01/06/2016  . Elevated troponin I level   . Normochromic normocytic anemia   . Atrial fibrillation with RVR (HCC)   . Acute bronchitis with COPD (HCC) 09/25/2015  . Acute hyponatremia 09/25/2015  . Increased anion gap metabolic acidosis 09/25/2015  . Leukocytosis 09/25/2015  . Sepsis (HCC) 09/25/2015  . Hypokalemia 09/25/2015  . COPD exacerbation (HCC)   . Fracture, ribs 02/06/2015  . Acute on chronic respiratory failure with hypoxia (HCC) 02/06/2015  . Obesity   . Iron deficiency anemia   . Hypertension   . Calcification of right breast 02/28/2011  . Family history of breast cancer 02/10/2011  . Diabetes mellitus type II, uncontrolled (HCC) 04/01/2007  . MITRAL VALVE PROLAPSE 04/01/2007  . ASTHMA 04/01/2007  . COPD (chronic obstructive pulmonary disease) (HCC) 04/01/2007  . POSTMENOPAUSAL SYNDROME 04/01/2007  . Rheumatoid arthritis (HCC) 04/01/2007  . VERTIGO 04/01/2007  . OSA (obstructive sleep apnea) 04/01/2007  . PALPITATIONS, HX OF 04/01/2007    Palliative Care Assessment & Plan   HPI: 70 y.o. female  with past medical history of Chronic respiratory failure, untreated sleep apnea, chronic atrial fib on anticoagulation, COPD, diabetes, anemia, admitted on 01/30/2016 with acute respiratory failure with associated encephalopathy. This is patient's third hospitalization in 6 months. She states she's been dealing with COPD for almost 20 years but this year she had developed as a sharp decline. She is wearing oxygen around-the-clock at 4 L and is becoming housebound. She has combined hypoxia as well as hypercarbic respiratory failure. She was diagnosed in the past with obstructive sleep apnea but was unable to tolerate CPAP with a mask due to claustrophobia. She has been intubated in the past, and was placed on BiPAP during this admission which  she also is having difficulty tolerating because of claustrophobia.   Assessment: Sitting up in bed, eating lunch. Sister at bedside. No distress.   Recommendations/Plan:  Dyspnea is mild at rest. Encourage use of BiPAP as she says she agrees to use this now (she is aware of her confusion and does not like this feeling). Would avoid sedating meds (opioids/benzos) with hypercarbia. Recommend up to chair and to slowly attempt to increase activity - allow adequate time for resp recovery with any activity.   Goals of Care and Additional Recommendations:  Limitations on Scope of Treatment: Avoid Hospitalization  Code Status:  DNR  Prognosis:   < 6 months in the setting of acute on chronic respiratory failure, both hypoxic and hypercarbic, worsening functional decline now wearing oxygen around-the-clock and becoming housebound; needing help for most activities of daily living  Discharge Planning:  Home with Hospice   Thank you for allowing the Palliative Medicine Team to assist in the care of this patient.   Time In: 1400 Time Out: 1515 Total Time 75min Prolonged Time Billed  yes       Greater than 50%  of this time was   spent counseling and coordinating care related to the above assessment and plan.   , NP Palliative Medicine Team Pager # [phone] (M-F 8a-5p) Team Phone # [phone] (Nights/Weekends)      

## 2016-02-05 NOTE — Progress Notes (Signed)
PULMONARY / CRITICAL CARE MEDICINE   Name: [name] MRN: [id number] DOB: 1945-07-31    ADMISSION DATE:  01/30/2016 CONSULTATION DATE:  02/03/16   REFERRING MD:  [facility]   CHIEF COMPLAINT: " I want to get off these machines"  HISTORY OF PRESENT ILLNESS:   70 yowf MO s/p smoking cessation around 2010 severely debilitated with sob across the room on home 02 around the clock 4lpm last seen in pulmonary clinic in 2008 and did not return with no pfts in epic but carries dx of copd/ohs and admittted with acute on chronic hypercarbic resp failure and with previous eval by Tyson Alias in 01/06/16 and made DNR p brief ET "hated it" and readmit 01/30/16 with sob and PCCM asked to see am 10/15 with hypercapnea but initially refusing to continue bipap, now wearing BiPAP each evening.    SUBJECTIVE:  Comfortable at 30 degrees hob able to speak in nearly full sentences on 9L/ min  [place] with saturations or 87-88% VITAL SIGNS: BP (!) 122/57   Pulse (!) 102   Temp 98.8 F (37.1 C) (Oral)   Resp (!) 24   Ht 5\' 7"  (1.702 m)   Wt 236 lb 15.9 oz (107.5 kg)   SpO2 93%   BMI 37.12 kg/m    INTAKE / OUTPUT: I/O last 3 completed shifts: In: 752.5 [P.O.:720; I.V.:32.5] Out: 1860 [Urine:1860]  PHYSICAL EXAMINATION:  General: chronically ill appearing, NAD Neuro: awake and alert, appropriate, MAE  CV: s1s2 rrr PULM: resps even non labored on [place], diminished throughout, no audible wheeze, bi basilar crackles  GI: round, soft, non tender, obese  Extremities: trace edema both feet    LABS:  BMET  Recent Labs Lab 02/03/16 0409 02/04/16 0342 02/05/16 0320  NA 138 135 133*  K 4.0 4.4 4.2  CL 82* 79* 78*  CO2 44* 48* 47*  BUN 18 20 20   CREATININE 1.29* 1.35* 1.44*  GLUCOSE 171* 170* 151*    Electrolytes  Recent Labs Lab 02/03/16 0409 02/04/16 0342 02/05/16 0320  CALCIUM 9.0 8.9 8.7*  MG 1.7 1.6* 1.6*  PHOS 4.5 3.7 3.1    CBC  Recent Labs Lab 02/03/16 0409 02/04/16 0342  02/05/16 0320  WBC 10.0 10.7* 11.9*  HGB 10.1* 10.2* 9.3*  HCT 35.0* 34.7* 31.2*  PLT 256 276 257    Coag's No results for input(s): APTT, INR in the last 168 hours.  Sepsis Markers No results for input(s): LATICACIDVEN, PROCALCITON, O2SATVEN in the last 168 hours.  ABG  Recent Labs Lab 02/02/16 2255 02/03/16 0310 02/03/16 0730  PHART 7.404 7.317* 7.343*  PCO2ART 73.7* 101* 94.9*  PO2ART 86.6 69.7* 59.9*    Liver Enzymes  Recent Labs Lab 02/03/16 0409 02/04/16 0342 02/05/16 0320  AST 12* 10* 11*  ALT 13* 11* 10*  ALKPHOS 39 39 38  BILITOT 0.7 0.7 0.6  ALBUMIN 2.7* 2.7* 2.5*    Cardiac Enzymes  Recent Labs Lab 01/31/16 0140 01/31/16 0448 01/31/16 1103  TROPONINI 0.03* 0.03* <0.03    Glucose  Recent Labs Lab 02/03/16 2122 02/04/16 0733 02/04/16 1121 02/04/16 1802 02/04/16 2058 02/05/16 0756  GLUCAP 260* 162* 234* 134* 290* 146*    Imaging No results found.   ASSESSMENT / PLAN:   Acute on chronic respiratory failure r/t end-stage COPD with OHS +/- OSA.  Agreed to  BIPAP 10/16 overnight  PLAN -   DNI  Palliative care meeting 02/05/2016 to allow for sister to be included. Titrate O2 as able to keep  sats 88-90%.   Minimize sedating medications.  Continue scheduled BD dulera, duoneb Continue diuresis per TRH Continue ABX: Vanc./ Cefepime  Continue BIPAP at night   Updated patient on status and plan. No family at bedside 10/17.  Pt with severe chronic respiratory failure and overall decline last few months. Pt wishes to be DNI status (which is appropriate given her underlying lung disease) and had did  agree to bipap 10/16-10/17 pm. ( She was initially refusing to wear) She wants to continue full medical care Palliative care also working with pt.family.  Meeting scheduled for 10/17 to discuss goals of care.  She would be appropriate for hospice care at home.    Bevelyn Ngo, AGACNP-BC [facility] 02/05/2016  11:29 AM Pager:  [phone]  ATTENDING NOTE / ATTESTATION NOTE :   I have discussed the case with the resident/APP  Kandice Robinsons  I agree with the resident/APP's  history, physical examination, assessment, and plans.    I have edited the above note and modified it according to our agreed history, physical examination, assessment and plan.   Briefly, pt admitted with acute on chronic hypoxemic hypercapneic resp fx 2/2 untreated OSA/OHS/Diastolic CHF/possible HCAP. Pt claustrophobic with BiPaP. Currently on IV abx. On diuresis.   Pt seen, in mild distress. Tachycardic. 120/60. 90% on 9L [place]. (-) NVD. Good ae. Some wheezing bilaterally. Tachycardiac. Gr 1 edema. Rest of PE per Sarah  Assessment : Acute on Chronic Hypoxemic Hypercapneic Resp Fx 2/2 Untreated OSA/OHS/AECOPD/Diatolic Dysfxn exacerbation/Possible HCAP.  Anxiety  Plan : 1. Noted pt's code status as DNI. Noted plans for Palliative Care meeting today. I discussed plan with sister Nelva Bush just now (she has POA). 2. Agree with BiPaP use (pt wants to try again) with Ativan 30 minutes-1 hr before bedtime to allow her to tolerate BipaP.  I wanted to decrease the BipaP pressure but pt said the pressure was OK.  3. Cont abx. Consider deescalation. 4. Cont diuresis 5. Asp precaution.  6. Will d/c dulera. Start budesonide neb BID. Cont duoneb qid.  7. DVT prophylaxis.     Family :Family updated at length today. Spoke to [place] (sister with POA. She is an [employer] fundraiser).    Pollie Meyer, MD 02/05/2016, 1:07 PM [facility] Pager [phone] After 3 pm or if no answer, call [phone]

## 2016-02-06 ENCOUNTER — Inpatient Hospital Stay (HOSPITAL_COMMUNITY): Payer: Medicare Other

## 2016-02-06 DIAGNOSIS — I5021 Acute systolic (congestive) heart failure: Secondary | ICD-10-CM

## 2016-02-06 DIAGNOSIS — J42 Unspecified chronic bronchitis: Secondary | ICD-10-CM

## 2016-02-06 LAB — GLUCOSE, CAPILLARY
GLUCOSE-CAPILLARY: 281 mg/dL — AB (ref 65–99)
GLUCOSE-CAPILLARY: 190 mg/dL — AB (ref 65–99)
GLUCOSE-CAPILLARY: 362 mg/dL — AB (ref 65–99)
Glucose-Capillary: 258 mg/dL — ABNORMAL HIGH (ref 65–99)

## 2016-02-06 LAB — CULTURE, BLOOD (ROUTINE X 2): CULTURE: NO GROWTH

## 2016-02-06 MED ORDER — INSULIN ASPART 100 UNIT/ML ~~LOC~~ SOLN: 0.0000 [IU] | Freq: Three times a day (TID) | SUBCUTANEOUS | Status: DC

## 2016-02-06 MED ORDER — TECHNETIUM TO 99M ALBUMIN AGGREGATED
4.2000 | Freq: Once | INTRAVENOUS | Status: AC | PRN
  Administered 2016-02-06: 4.2 via INTRAVENOUS

## 2016-02-06 MED ORDER — AMIODARONE HCL 200 MG PO TABS
200.0000 mg | ORAL_TABLET | Freq: Every day | ORAL | Status: DC
  Administered 2016-02-06 – 2016-02-07 (×2): 200 mg via ORAL
  Filled 2016-02-06 (×2): qty 1

## 2016-02-06 MED ORDER — RIVAROXABAN 15 MG PO TABS
15.0000 mg | ORAL_TABLET | Freq: Every day | ORAL | Status: DC
Start: 2016-02-06 — End: 2016-02-08
  Administered 2016-02-06 – 2016-02-07 (×2): 15 mg via ORAL
  Filled 2016-02-06 (×3): qty 1

## 2016-02-06 MED ORDER — METHYLPREDNISOLONE SODIUM SUCC 40 MG IJ SOLR
40.0000 mg | INTRAMUSCULAR | Status: DC
  Administered 2016-02-06 – 2016-02-07 (×2): 40 mg via INTRAVENOUS
  Filled 2016-02-06 (×2): qty 1

## 2016-02-06 MED ORDER — TECHNETIUM TC 99M DIETHYLENETRIAME-PENTAACETIC ACID: 31.2000 | Freq: Once | INTRAVENOUS | Status: DC | PRN

## 2016-02-06 MED ORDER — INSULIN GLARGINE 100 UNIT/ML ~~LOC~~ SOLN
25.0000 [IU] | Freq: Every day | SUBCUTANEOUS | Status: DC
  Administered 2016-02-06 – 2016-02-08 (×3): 25 [IU] via SUBCUTANEOUS
  Filled 2016-02-06 (×3): qty 0.25

## 2016-02-06 MED ORDER — INSULIN ASPART 100 UNIT/ML ~~LOC~~ SOLN
0.0000 [IU] | Freq: Every day | SUBCUTANEOUS | Status: DC
  Administered 2016-02-06: 5 [IU] via SUBCUTANEOUS
  Administered 2016-02-07: 4 [IU] via SUBCUTANEOUS

## 2016-02-06 NOTE — Progress Notes (Addendum)
PROGRESS NOTE    [name]  ZOX:[id number] DOB: 04/13/1946 DOA: 01/30/2016 PCP: Katy ApoPOLITE,RONALD D, MD   Brief Narrative:  70 y.o. female admitted 3 weeks ago for respiratory failure and encephalopathy with known hx of sleep apnea non compliant with CPAP, chronic afib, COPD, and DM, presents with worsening shortness of breath. Patient states she has chronic shortness of breath which had acutely worsened today with increasing lower extremity edema. and chest tightness. Early in the morning at her house, patient also had a fall and hit her head but did not lose consciousness after she lost her balance. Patient still has mild occipital headache, CT head is pending. CXR shows congestion and mild pleural effusion with possible infiltrates. Patient on exam, is mildly short of breath but able to complete sentences. JVD is elevated with bilateral lower extremity edema and mild wheezing. Since CXR was showing possible infiltrates patient was started on antibiotics for pneumonia . Patient also has clinical features consistent with CHF and has been given lasix 40mg  and placed on oxygen mask. Patient was admitted for further managemt of acue respiratory failure possibly a combination of CHF and COPD with possible pneumonia.   ED Course: chloride 85, Cr 1.38, glucose 301, WBC 14.9, hgb 9.6 see hix of present illness.  While in the ED, she was started on IV antibiotics cefepime and vancomycin, lasix 40mg , and nebs.     Subjective:  Patient now compliant with BiPAP at night, requiring 60% FiO2, low-grade fever of 99.1    Assessment & Plan:   Principal Problem:   Acute on chronic respiratory failure with hypoxia and hypercapnia (HCC) Active Problems:   Diabetes mellitus type II, uncontrolled (HCC)   COPD (chronic obstructive pulmonary disease) (HCC)   OSA (obstructive sleep apnea)   Iron deficiency anemia   Acute on chronic respiratory failure with hypoxia (HCC)   Hypertension   COPD exacerbation  (HCC)   Type 2 diabetes mellitus without complication, without long-term current use of insulin (HCC)   Acute respiratory failure (HCC)   CHF (congestive heart failure) (HCC)   Palliative care encounter   Fall   SOB (shortness of breath)   DNR (do not resuscitate)   Chronic diastolic CHF (congestive heart failure) (HCC)   Dilated cardiomyopathy (HCC)   HCAP (healthcare-associated pneumonia)   CKD (chronic kidney disease), stage III   Acute on Chronic Hypercapnic Hypoxic Respiratory Failure likley 2/2 Acute Exacerbation of CHF with likely concomitant COPD and ? HCAP  -Pulmonology Consulted yesterday because of Respiratory Distress and was placed on BiPAP which was started on 10/16. Now Transitioned on to HFNC; Per Pumonology attempt to Wean -Discussed plan with Patient and she stated she did not want any more BiPAP and did not want to be Intubated. Code Status Changed to DNI.  -Palliative Care Consulted following Titrate O2 as able to keep sats 88-90%.   Minimize sedating medications.  Continue scheduled BD dulera, duoneb Continue diuresis   Continue ABX: Vanc./ Cefepime  Continue BIPAP at night  -Spoke with sister and patient awaiting Palliative Care recommendations. -Not totally convinced patient's large increase in O2 demand fully explained by COPD exacerbation/HCAP, VQ scan with limited study quality, no indication for PE  -Completed course of antibiotics.    Prognosis:   < 6 monthsin the setting of acute on chronic respiratory failure, both hypoxic and hypercarbic, worsening functional decline now wearing oxygen around-the-clock and becoming housebound; needing help for most activities of daily living  Expected to be discharged home with  hospice based on palliative care meeting on 10/17    Supected Acute Decompensation of Chronic Diastolic of CHF with EF of 60-65%/Cardiomyopathy -Repeat ECHOcardiogram; Systolic Function was vigorous with Estimated EF of 65-70%. The study  is not technically sufficient to allowevaluation of LV diastolic function -Cardiac Troponins Negative x3 -Patient with extremely poor respiratory status. Currently in NSR will try to titrate Amiodarone off. Amiodarone/cardiac meds to be adjusted by cardiology -Cardizem 240 mg daily -Lasix 20 mg daily -Metoprolol 25 mg BID -Spironolactone 25 mg daily    COPD Exacerbation/HCAP  -CXR showed Ill-defined opacities in the lung bases could relate to atelectasis or infiltrate. Tiny bilateral effusions. -Completed 5 day course antibiotics.  -DC DuoNeb secondary to A. fib -Xopenex QID -Repeat CXR showed Bibasilar Subsegmental Atelectasis.  -Solu-Medrol 60 mg daily>40 mg daily    Paroxysmal Atrial Fibrillation with RVR (hx cardioversion 09/2015) s/p Spontaneous Conversion to NSR(Chads2vasc score is 4. ) Amiodarone/Cardizem being managed by cardiology - Patientis on Xarelto for Anticoagulation.  continue amiodarone indefinitely due to risk of recurrence of atrial fibrillation and precipitation of acute on chronic diastolic heart failure.  Blood Culture Contiamination? -DC antibiotics 10/13 BCx: BCID + staph spp w/ methicillin resistance  DM type 2 - Blood Sugar on BMP was 171.AIC  7.5 - Will be on Sensitivesliding scale coverage.  start lantus , CBG uncontrolled    CKD stage III.(Baseline BUN/Cr was  17/1.36) Lab Results  Component Value Date   CREATININE 1.44 (H) 02/05/2016   CREATININE 1.35 (H) 02/04/2016   CREATININE 1.29 (H) 02/03/2016    Chronic Iron deficiency Anemia - On iron replacement with Ferrous Sulfate 325 mg po Daily.  - Hemoglobin went from 9.1/30.9 -> 8.5/28.4 -> 10.0/34.4 -> 10.2/34.7 - Repeat CBC in AM with Diff  OSA.  -Noncompliant with CPAP secondary to anxiety with mask.  -Did wear mask on 10/16.   -Increase trazodone 50-100 mg PO PRN QHS  -Will require outpatient sleep study   Mechanical Fall. - Patient has a history of brain aneurysm with  coiling.  - CT head showed Left internal carotid artery terminus and basilar coil masses with extensive streak artifact obscuring the brain at those levels. Within the visualized brain no acute abnormality is identified. Grossly stable moderate chronic microvascular ischemic changes given differences in technique.  Right Sided Rib Pain - Improved Pain control with Norco.  - Likely 2/2 to Mechanical Fall - C/w Norco 1 tablet q6hprn - Continue to Monitor   DVT prophylaxis: Xarelto Code Status: DO NOT RESUSCITATE Family Communication: Sister present Disposition Plan: Anticipate home with hospice, and the next one to 2 days    Consultants:  Dr Vonna Kotyk. Jacinto Halim,  cardiology NP [facility] Palliative care    Procedures/Significant Events:  10/12 echocardiogram:LVEF= 65% to 70%.- Mitral valve: Calcified annulus. - Right ventricle: mildly dilated.- Right atrium: mildly dilated. - Inferior vena cava: The vessel was dilated. The respirophasic diameter changes were blunted (< 50%), consistent with elevated central venous pressure.  Cultures 10/12 MRSA by PCR negative 10/13 blood right AC NGTD 10/13 blood left arm positive coag negative staph (most likely contaminant)    Antimicrobials: Cefepime 10/11>> 10/17 Vancomycin 10/11>> 10/17   Devices None   LINES / TUBES:  None    Continuous Infusions: . sodium chloride 10 mL/hr at 02/05/16 1400     Objective: Vitals:   02/06/16 0235 02/06/16 0236 02/06/16 0308 02/06/16 0500  BP: (!) 108/49 (!) 108/49    Pulse: (!) 59 (!) 55 60  Resp: (!) 24 (!) 24 (!) 22   Temp:  99.1 F (37.3 C)    TempSrc:  Axillary    SpO2: 96% 94% 98%   Weight:    107.5 kg (236 lb 15.9 oz)  Height:        Intake/Output Summary (Last 24 hours) at 02/06/16 0841 Last data filed at 02/06/16 0237  Gross per 24 hour  Intake           284.83 ml  Output             1000 ml  Net          -715.17 ml   Filed Weights   02/04/16 0246 02/05/16 0424  02/06/16 0500  Weight: 107.4 kg (236 lb 12.4 oz) 107.5 kg (236 lb 15.9 oz) 107.5 kg (236 lb 15.9 oz)    Examination:  General: A/O 4, positive acute on chronic respiratory distress (on 10 L O2 via [place] to maintain adequate SPO2:  Eyes: negative scleral hemorrhage, negative anisocoria, negative icterus ENT: Negative Runny nose, negative gingival bleeding, Neck:  Negative scars, masses, torticollis, lymphadenopathy, JVD Lungs: poor to no air movement all lung fields, bibasilar crackles, negative expiratory wheezing.  Cardiovascular:  Tachycardic,Regular rhythm without murmur gallop or rub normal S1 and S2 Abdomen: morbidly obese, negative abdominal pain, nondistended, positive soft, bowel sounds, no rebound, no ascites, no appreciable mass Extremities:  bilateral lower extremity edema 1+ (per sister significantly improved), left first metatarsal erythematous warm to touch (consistent with RA flare). Skin: Negative rashes, lesions, ulcers Psychiatric:  Negative depression, negative anxiety, negative fatigue, negative mania  Central nervous system:  Cranial nerves II through XII intact, tongue/uvula midline, all extremities muscle strength 5/5, sensation intact throughout, negative dysarthria, negative expressive aphasia, negative receptive aphasia.  .     Data Reviewed: Care during the described time interval was provided by me .  I have reviewed this patient's available data, including medical history, events of note, physical examination, and all test results as part of my evaluation. I have personally reviewed and interpreted all radiology studies.  CBC:  Recent Labs Lab 02/01/16 0311 02/02/16 0306 02/03/16 0409 02/04/16 0342 02/05/16 0320  WBC 16.8* 12.2* 10.0 10.7* 11.9*  NEUTROABS 13.7* 8.3* 6.8 7.8* 8.4*  HGB 8.5* 10.0* 10.1* 10.2* 9.3*  HCT 28.4* 34.4* 35.0* 34.7* 31.2*  MCV 89.9 92.2 93.3 92.5 90.7  PLT 281 278 256 276 257   Basic Metabolic Panel:  Recent Labs Lab  02/01/16 0311 02/02/16 0306 02/03/16 0409 02/04/16 0342 02/05/16 0320  NA 134* 137 138 135 133*  K 4.3 3.4* 4.0 4.4 4.2  CL 81* 79* 82* 79* 78*  CO2 46* 49* 44* 48* 47*  GLUCOSE 190* 142* 171* 170* 151*  BUN 20 17 18 20 20   CREATININE 1.56* 1.36* 1.29* 1.35* 1.44*  CALCIUM 8.5* 8.8* 9.0 8.9 8.7*  MG 1.6* 1.5* 1.7 1.6* 1.6*  PHOS 4.1 4.6 4.5 3.7 3.1   GFR: Estimated Creatinine Clearance: 45.9 mL/min (by C-G formula based on SCr of 1.44 mg/dL (H)). Liver Function Tests:  Recent Labs Lab 02/01/16 0311 02/03/16 0409 02/04/16 0342 02/05/16 0320  AST 17 12* 10* 11*  ALT 13* 13* 11* 10*  ALKPHOS 36* 39 39 38  BILITOT 0.4 0.7 0.7 0.6  PROT 5.6* 5.8* 6.0* 5.6*  ALBUMIN 2.8* 2.7* 2.7* 2.5*   No results for input(s): LIPASE, AMYLASE in the last 168 hours. No results for input(s): AMMONIA in the last 168 hours. Coagulation Profile: No  results for input(s): INR, PROTIME in the last 168 hours. Cardiac Enzymes:  Recent Labs Lab 01/31/16 0140 01/31/16 0448 01/31/16 1103  TROPONINI 0.03* 0.03* <0.03   BNP (last 3 results) No results for input(s): PROBNP in the last 8760 hours. HbA1C: No results for input(s): HGBA1C in the last 72 hours. CBG:  Recent Labs Lab 02/04/16 2058 02/05/16 0756 02/05/16 1219 02/05/16 1616 02/05/16 2133  GLUCAP 290* 146* 247* 151* 320*   Lipid Profile: No results for input(s): CHOL, HDL, LDLCALC, TRIG, CHOLHDL, LDLDIRECT in the last 72 hours. Thyroid Function Tests: No results for input(s): TSH, T4TOTAL, FREET4, T3FREE, THYROIDAB in the last 72 hours. Anemia Panel: No results for input(s): VITAMINB12, FOLATE, FERRITIN, TIBC, IRON, RETICCTPCT in the last 72 hours. Urine analysis:    Component Value Date/Time   COLORURINE YELLOW 01/06/2016 1047   APPEARANCEUR CLEAR 01/06/2016 1047   LABSPEC 1.013 01/06/2016 1047   PHURINE 5.0 01/06/2016 1047   GLUCOSEU 250 (A) 01/06/2016 1047   HGBUR NEGATIVE 01/06/2016 1047   BILIRUBINUR NEGATIVE  01/06/2016 1047   KETONESUR NEGATIVE 01/06/2016 1047   PROTEINUR NEGATIVE 01/06/2016 1047   UROBILINOGEN 0.2 02/06/2015 1906   NITRITE NEGATIVE 01/06/2016 1047   LEUKOCYTESUR NEGATIVE 01/06/2016 1047   Sepsis Labs: @LABRCNTIP (procalcitonin:4,lacticidven:4)  ) Recent Results (from the past 240 hour(s))  MRSA PCR Screening     Status: None   Collection Time: 01/31/16  6:28 AM  Result Value Ref Range Status   MRSA by PCR NEGATIVE NEGATIVE Final    Comment:        The GeneXpert MRSA Assay (FDA approved for NASAL specimens only), is one component of a comprehensive MRSA colonization surveillance program. It is not intended to diagnose MRSA infection nor to guide or monitor treatment for MRSA infections.   Culture, blood (Routine X 2) w Reflex to ID Panel     Status: None (Preliminary result)   Collection Time: 02/01/16  7:41 AM  Result Value Ref Range Status   Specimen Description BLOOD RIGHT ANTECUBITAL  Final   Special Requests BOTTLES DRAWN AEROBIC AND ANAEROBIC 5CC  Final   Culture NO GROWTH 4 DAYS  Final   Report Status PENDING  Incomplete  Culture, blood (Routine X 2) w Reflex to ID Panel     Status: Abnormal   Collection Time: 02/01/16  7:43 AM  Result Value Ref Range Status   Specimen Description BLOOD LEFT ARM  Final   Special Requests IN PEDIATRIC BOTTLE 3CC  Final   Culture  Setup Time   Final    GRAM POSITIVE COCCI IN CLUSTERS AEROBIC BOTTLE ONLY Organism ID to follow CRITICAL RESULT CALLED TO, READ BACK BY AND VERIFIED WITH: A. Masters Pharm.D. 9:05 02/02/16  ([email or web address])    Culture (A)  Final    STAPHYLOCOCCUS SPECIES (COAGULASE NEGATIVE) THE SIGNIFICANCE OF ISOLATING THIS ORGANISM FROM A SINGLE SET OF BLOOD CULTURES WHEN MULTIPLE SETS ARE DRAWN IS UNCERTAIN. PLEASE NOTIFY THE MICROBIOLOGY DEPARTMENT WITHIN ONE WEEK IF SPECIATION AND SENSITIVITIES ARE REQUIRED.    Report Status 02/04/2016 FINAL  Final  Blood Culture ID Panel (Reflexed)     Status: Abnormal    Collection Time: 02/01/16  7:43 AM  Result Value Ref Range Status   Enterococcus species NOT DETECTED NOT DETECTED Final   Listeria monocytogenes NOT DETECTED NOT DETECTED Final   Staphylococcus species DETECTED (A) NOT DETECTED Final    Comment: CRITICAL RESULT CALLED TO, READ BACK BY AND VERIFIED WITH: A. Masters Pharm.D. 9:05 02/02/16 ([email or web address])  Staphylococcus aureus NOT DETECTED NOT DETECTED Final   Methicillin resistance DETECTED (A) NOT DETECTED Final    Comment: CRITICAL RESULT CALLED TO, READ BACK BY AND VERIFIED WITH: A. Masters Pharm.D. 9:05 02/02/16 ([email or web address])    Streptococcus species NOT DETECTED NOT DETECTED Final   Streptococcus agalactiae NOT DETECTED NOT DETECTED Final   Streptococcus pneumoniae NOT DETECTED NOT DETECTED Final   Streptococcus pyogenes NOT DETECTED NOT DETECTED Final   Acinetobacter baumannii NOT DETECTED NOT DETECTED Final   Enterobacteriaceae species NOT DETECTED NOT DETECTED Final   Enterobacter cloacae complex NOT DETECTED NOT DETECTED Final   Escherichia coli NOT DETECTED NOT DETECTED Final   Klebsiella oxytoca NOT DETECTED NOT DETECTED Final   Klebsiella pneumoniae NOT DETECTED NOT DETECTED Final   Proteus species NOT DETECTED NOT DETECTED Final   Serratia marcescens NOT DETECTED NOT DETECTED Final   Haemophilus influenzae NOT DETECTED NOT DETECTED Final   Neisseria meningitidis NOT DETECTED NOT DETECTED Final   Pseudomonas aeruginosa NOT DETECTED NOT DETECTED Final   Candida albicans NOT DETECTED NOT DETECTED Final   Candida glabrata NOT DETECTED NOT DETECTED Final   Candida krusei NOT DETECTED NOT DETECTED Final   Candida parapsilosis NOT DETECTED NOT DETECTED Final   Candida tropicalis NOT DETECTED NOT DETECTED Final         Radiology Studies: No results found.      Scheduled Meds: . amiodarone  400 mg Oral TID  . budesonide (PULMICORT) nebulizer solution  0.5 mg Nebulization BID  . diltiazem  240 mg Oral Daily  . ferrous  sulfate  325 mg Oral Q breakfast  . furosemide  20 mg Intravenous Daily  . insulin aspart  0-9 Units Subcutaneous TID WC  . lactobacillus acidophilus  1 tablet Oral Daily  . levalbuterol  1.25 mg Nebulization Q6H  . magnesium oxide  400 mg Oral BID  . methylPREDNISolone (SOLU-MEDROL) injection  60 mg Intravenous Q24H  . metoprolol tartrate  25 mg Oral BID  . pantoprazole  40 mg Oral Daily  . polyethylene glycol  17 g Oral Daily  . potassium chloride  20 mEq Oral Once  . rivaroxaban  20 mg Oral Q supper  . senna  1 tablet Oral Daily  . sodium chloride flush  3 mL Intravenous Q12H  . spironolactone  25 mg Oral Daily  . traZODone  50-100 mg Oral QHS   Continuous Infusions: . sodium chloride 10 mL/hr at 02/05/16 1400     LOS: 7 days    Time spent: 40 minutes    Richarda Overlie, MD [facility] Pager [phone]   If 7PM-7AM, please contact night-coverage [email or web address] Password [email or web address] 02/06/2016, 8:41 AM

## 2016-02-06 NOTE — Progress Notes (Signed)
Subjective:  Feels much better, dyspnea has improved. Did use BiPAP yesterday. Has doubts about Hospice and states that she's not ready to die. Is wondering why hospice should be involved in between physicians and herself and that she would like to make all the discussions.  Objective:  Vital Signs in the last 24 hours: Temp:  [98.2 F (36.8 C)-99.2 F (37.3 C)] 98.2 F (36.8 C) (10/18 0823) Pulse Rate:  [55-104] 74 (10/18 0823) Resp:  [21-31] 21 (10/18 0823) BP: (87-139)/(48-83) 100/77 (10/18 0823) SpO2:  [82 %-100 %] 98 % (10/18 0928) FiO2 (%):  [60 %] 60 % (10/18 0308) Weight:  [107.5 kg (236 lb 15.9 oz)] 107.5 kg (236 lb 15.9 oz) (10/18 0500)  Intake/Output from previous day: 10/17 0701 - 10/18 0700 In: 494.8 [P.O.:240; I.V.:254.8] Out: 1220 [Urine:1220]   Body mass index is 37.12 kg/m.  Physical Exam:  General appearance: alert, appears stated age, no distress and moderately obese Eyes: negative findings: lids and lashes normal Neck: no adenopathy, no carotid bruit, supple, symmetrical, trachea midline, thyroid not enlarged, symmetric, no tenderness/mass/nodules and short neck Neck: JVP - normal, carotids 2+= without bruits Resp: wheezes bilaterally Chest wall: no tenderness Cardio: regular rate and rhythm, S1, S2 normal, no murmur, click, rub or gallop GI: soft, non-tender; bowel sounds normal; no masses,  no organomegaly and obese Extremities: edema trace  Lab Results: BMP  Recent Labs  02/03/16 0409 02/04/16 0342 02/05/16 0320  NA 138 135 133*  K 4.0 4.4 4.2  CL 82* 79* 78*  CO2 44* 48* 47*  GLUCOSE 171* 170* 151*  BUN 18 20 20   CREATININE 1.29* 1.35* 1.44*  CALCIUM 9.0 8.9 8.7*  GFRNONAA 41* 39* 36*  GFRAA 47* 45* 42*    CBC  Recent Labs Lab 02/05/16 0320  WBC 11.9*  RBC 3.44*  HGB 9.3*  HCT 31.2*  PLT 257  MCV 90.7  MCH 27.0  MCHC 29.8*  RDW 17.0*  LYMPHSABS 2.1  MONOABS 1.0  EOSABS 0.4  BASOSABS 0.0    HEMOGLOBIN A1C Lab Results   Component Value Date   HGBA1C 7.5 (H) 01/06/2016   MPG 169 01/06/2016    Cardiac Panel (last 3 results)  Recent Labs  09/25/15 0747  01/31/16 0140 01/31/16 0448 01/31/16 1103  CKTOTAL 168  --   --   --   --   TROPONINI  --   < > 0.03* 0.03* <0.03  < > = values in this interval not displayed.   Recent Labs  10/01/15 1331 01/31/16 0140  TSH 0.613 1.990    Recent Labs  10/02/15 0306  02/03/16 0409 02/04/16 0342 02/05/16 0320  PROT 5.8*  < > 5.8* 6.0* 5.6*  ALBUMIN 2.7*  < > 2.7* 2.7* 2.5*  AST 15  < > 12* 10* 11*  ALT 21  < > 13* 11* 10*  ALKPHOS 39  < > 39 39 38  BILITOT 0.4  < > 0.7 0.7 0.6  BILIDIR <0.1*  --   --   --   --   IBILI NOT CALCULATED  --   --   --   --   < > = values in this interval not displayed.  Imaging: Imaging results have been reviewed  Cardiac Studies: EKG 01/31/16: Atrial fibrillation with RVR. No evidence of ischemia. This is in spite of RVR of 1 46 bpm.  Echo: Hyperdynamic LV, no significant valvular abnormality, poor echo window. Right ventricle appears mildly dilated. IVC is dilated with  reduced respiratory response suggests elevated right heart pressure.  Tele: 02/01/16: NSR   Scheduled Meds: . amiodarone  200 mg Oral Daily  . budesonide (PULMICORT) nebulizer solution  0.5 mg Nebulization BID  . diltiazem  240 mg Oral Daily  . ferrous sulfate  325 mg Oral Q breakfast  . furosemide  20 mg Intravenous Daily  . insulin aspart  0-9 Units Subcutaneous TID WC  . insulin glargine  25 Units Subcutaneous Daily  . lactobacillus acidophilus  1 tablet Oral Daily  . levalbuterol  1.25 mg Nebulization Q6H  . magnesium oxide  400 mg Oral BID  . methylPREDNISolone (SOLU-MEDROL) injection  60 mg Intravenous Q24H  . metoprolol tartrate  25 mg Oral BID  . pantoprazole  40 mg Oral Daily  . polyethylene glycol  17 g Oral Daily  . potassium chloride  20 mEq Oral Once  . rivaroxaban  20 mg Oral Q supper  . senna  1 tablet Oral Daily  . sodium  chloride flush  3 mL Intravenous Q12H  . spironolactone  25 mg Oral Daily  . traZODone  50-100 mg Oral QHS   Continuous Infusions: . sodium chloride 10 mL/hr at 02/05/16 1400   PRN Meds:.acetaminophen **OR** acetaminophen, diphenhydrAMINE, HYDROcodone-acetaminophen, levalbuterol, LORazepam, metoprolol, ondansetron **OR** ondansetron (ZOFRAN) IV, technetium TC 62M diethylenetriame-pentaacetic acid  Assessment/Plan:   1. Paroxysmal atrial fibrillation now in NSR. CHA2DS2-VASCScore: Risk Score 4(without stroke as it was related to aneurysm), Yearly risk of stroke 4%. 2. Hypertension 3. Hyperlipidemia 4. Diabetes mellitus type 2 uncontrolled without use of insulin with stage 3 CKD 5. Morbid obesity 6. COPD with acute exacerbation, history of 40-pack-year history of smoking, quit 2006 7. History of cerebral aneurysm S/P coiling with residual aneurysm present. 8. Chronic iron deficiency anemia.  ReC:  Patient now in sinus rhythm. Continue long term anticoagulation, reduce the dose Xarelto from 20 mg to 15 mg due to renal insufficiency. Also I will decrease the dose of amiodarone to 200 mg daily as she has converted to sinus rhythm and is maintaining sinus rhythm. I would recommend continuing this amiodarone indefinitely due to risk of recurrence of atrial fibrillation and precipitation of acute on chronic diastolic heart failure.  I did discuss with the patient that hospice care does not mean end-of-life or giving up on therapy. Also discussed with her that they will coordinate the care and try to keep her comfortable. Please call if questions.  [name], M.D. 02/06/2016, 9:39 AM [facility] Pager: [phone] Office: [phone] If no answer: [phone]

## 2016-02-06 NOTE — Progress Notes (Signed)
Daily Progress Note   Patient Name: [name]       Date: 02/06/2016 DOB: 1945-07-04  Age: 70 y.o. MRN#: [id number] Attending Physician: Reyne Dumas, MD Primary Care Physician: Kandice Hams, MD Admit Date: 01/30/2016  Reason for Consultation/Follow-up: Establishing goals of care  Subjective: I met again with [name] along with Jaime Court, NP. No family at bedside. She does not necessarily recall our conversation from yesterday. She has concerns about getting a hospital bed and equipment to her home for discharge. I reminded her that having hospice support will supply this for her and the hospital would work with hospice to coordinate discharge. She is worried about sister [name] trying to do everything and overdoing to help her.   We helped her to rearrange in the bed and get a little more comfortable. She really needs to get out of bed in preparation for home. Any d/c planning needs to be discussed with sister [name] as well as [name]'s memory is not great. Left message for [name] as [name] says she had questions (she does have my number).   Length of Stay: 7  Current Medications: Scheduled Meds:  . amiodarone  200 mg Oral Daily  . budesonide (PULMICORT) nebulizer solution  0.5 mg Nebulization BID  . diltiazem  240 mg Oral Daily  . ferrous sulfate  325 mg Oral Q breakfast  . furosemide  20 mg Intravenous Daily  . insulin aspart  0-9 Units Subcutaneous TID WC  . insulin glargine  25 Units Subcutaneous Daily  . lactobacillus acidophilus  1 tablet Oral Daily  . levalbuterol  1.25 mg Nebulization Q6H  . magnesium oxide  400 mg Oral BID  . methylPREDNISolone (SOLU-MEDROL) injection  40 mg Intravenous Q24H  . metoprolol tartrate  25 mg Oral BID  . pantoprazole  40 mg Oral Daily  . polyethylene  glycol  17 g Oral Daily  . potassium chloride  20 mEq Oral Once  . rivaroxaban  15 mg Oral Q supper  . senna  1 tablet Oral Daily  . sodium chloride flush  3 mL Intravenous Q12H  . spironolactone  25 mg Oral Daily  . traZODone  50-100 mg Oral QHS    Continuous Infusions: . sodium chloride 10 mL/hr at 02/05/16 1400    PRN Meds: acetaminophen **OR** acetaminophen, diphenhydrAMINE, HYDROcodone-acetaminophen,  levalbuterol, LORazepam, metoprolol, ondansetron **OR** ondansetron (ZOFRAN) IV, technetium TC 38M diethylenetriame-pentaacetic acid  Physical Exam  Constitutional: She is oriented to person, place, and time. She appears well-developed and well-nourished.  HENT:  Head: Normocephalic and atraumatic.  Cardiovascular: Normal rate.  An irregularly irregular rhythm present.  Pulmonary/Chest: No accessory muscle usage. No tachypnea. No respiratory distress.  No SOB observed at rest.   Abdominal: Soft. Normal appearance.  Neurological: She is alert and oriented to person, place, and time.  Mostly oriented and aware of situation although moments of confusion.   Nursing note and vitals reviewed.           Vital Signs: BP (!) 121/55   Pulse 67   Temp 98.7 F (37.1 C) (Oral)   Resp (!) 23   Ht _0  (1.702 m)   Wt 107.5 kg (236 lb 15.9 oz)   SpO2 99%   BMI 37.12 kg/m  SpO2: SpO2: 99 % O2 Device: O2 Device: High Flow Nasal Cannula O2 Flow Rate: O2 Flow Rate (L/min): 6 L/min  Intake/output summary:   Intake/Output Summary (Last 24 hours) at 02/06/16 1636 Last data filed at 02/06/16 1407  Gross per 24 hour  Intake             1200 ml  Output             1275 ml  Net              -75 ml   LBM: Last BM Date:  (unknown at this time) Baseline Weight: Weight: 108.9 kg (240 lb) Most recent weight: Weight: 107.5 kg (236 lb 15.9 oz)       Palliative Assessment/Data: PPS: 30%   Flowsheet Rows   Flowsheet Row Most Recent Value  Intake Tab  Referral Department  Hospitalist    Unit at Time of Referral  Intermediate Care Unit  Palliative Care Primary Diagnosis  Pulmonary  Date Notified  02/03/16  Palliative Care Type  New Palliative care  Reason for referral  Non-pain Symptom, Clarify Goals of Care, Counsel Regarding Hospice, Psychosocial or Spiritual support  Date of Admission  01/30/16  Date first seen by Palliative Care  02/03/16  # of days Palliative referral response time  0 Day(s)  # of days IP prior to Palliative referral  4  Clinical Assessment  Palliative Performance Scale Score  40%  Pain Max last 24 hours  0  Pain Min Last 24 hours  0  Dyspnea Max Last 24 Hours  8  Dyspnea Min Last 24 hours  3  Nausea Max Last 24 Hours  0  Nausea Min Last 24 Hours  0  Anxiety Min Last 24 Hours  0  Psychosocial & Spiritual Assessment  Palliative Care Outcomes  Patient/Family meeting held?  Yes  Who was at the meeting?  pt  Palliative Care Outcomes  Provided psychosocial or spiritual support, Counseled regarding hospice  Palliative Care follow-up planned  Yes, Facility      Patient Active Problem List   Diagnosis Date Noted  . DNR (do not resuscitate)   . Chronic diastolic CHF (congestive heart failure) ([place])   . Dilated cardiomyopathy ([place])   . HCAP (healthcare-associated pneumonia)   . CKD (chronic kidney disease), stage III   . CHF (congestive heart failure) ([place])   . Palliative care encounter   . Fall   . SOB (shortness of breath)   . Acute respiratory failure ([place]) 01/30/2016  . Chronic obstructive pulmonary disease with acute exacerbation (  [place])   . Type 2 diabetes mellitus without complication, without long-term current use of insulin ([place])   . Chronic atrial fibrillation ([place])   . Muscle weakness (generalized)   . Diarrhea   . Difficulty in walking, not elsewhere classified   . Acute on chronic respiratory failure with hypoxia and hypercapnia ([place]) 01/06/2016  . Elevated troponin I level   . Normochromic normocytic anemia   . Atrial  fibrillation with RVR ([place])   . Acute bronchitis with COPD ([place]) 09/25/2015  . Acute hyponatremia 09/25/2015  . Increased anion gap metabolic acidosis 75/91/6384  . Leukocytosis 09/25/2015  . Sepsis ([place]) 09/25/2015  . Hypokalemia 09/25/2015  . COPD exacerbation ([place])   . Fracture, ribs 02/06/2015  . Acute on chronic respiratory failure with hypoxia ([place]) 02/06/2015  . Obesity   . Iron deficiency anemia   . Hypertension   . Calcification of right breast 02/28/2011  . Family history of breast cancer 02/10/2011  . Diabetes mellitus type II, uncontrolled ([place]) 04/01/2007  . MITRAL VALVE PROLAPSE 04/01/2007  . ASTHMA 04/01/2007  . COPD (chronic obstructive pulmonary disease) ([place]) 04/01/2007  . POSTMENOPAUSAL SYNDROME 04/01/2007  . Rheumatoid arthritis ([place]) 04/01/2007  . VERTIGO 04/01/2007  . OSA (obstructive sleep apnea) 04/01/2007  . PALPITATIONS, HX OF 04/01/2007    Palliative Care Assessment & Plan   HPI: 70 y.o. female  with past medical history of Chronic respiratory failure, untreated sleep apnea, chronic atrial fib on anticoagulation, COPD, diabetes, anemia, admitted on 01/30/2016 with acute respiratory failure with associated encephalopathy. This is patient's third hospitalization in 6 months. She states she's been dealing with COPD for almost 20 years but this year she had developed as a sharp decline. She is wearing oxygen around-the-clock at 4 L and is becoming housebound. She has combined hypoxia as well as hypercarbic respiratory failure. She was diagnosed in the past with obstructive sleep apnea but was unable to tolerate CPAP with a mask due to claustrophobia. She has been intubated in the past, and was placed on BiPAP during this admission which she also is having difficulty tolerating because of claustrophobia.   Assessment: Sitting up in bed. No distress.   Recommendations/Plan:  Dyspnea is mild at rest. Tolerating BiPAP well past 2 nights and low dose ativan  helps. Recommend up to chair and to slowly attempt to increase activity - allow adequate time for resp recovery with any activity.   Goals of Care and Additional Recommendations:  Limitations on Scope of Treatment: Avoid Hospitalization  Code Status:  DNR  Prognosis:   < 6 months in the setting of acute on chronic respiratory failure, both hypoxic and hypercarbic, worsening functional decline now wearing oxygen around-the-clock and becoming housebound; needing help for most activities of daily living  Discharge Planning:  Home with Hospice   Thank you for allowing the Palliative Medicine Team to assist in the care of this patient.   Time In: 1600 Time Out: 1640 Total Time 11mn Prolonged Time Billed  no      Greater than 50%  of this time was spent counseling and coordinating care related to the above assessment and plan.  AVinie Sill NP Palliative Medicine Team Pager # [phone](M-F 8a-5p) Team Phone # [phone](Nights/Weekends)

## 2016-02-06 NOTE — Progress Notes (Signed)
PULMONARY / CRITICAL CARE MEDICINE   Name: [name] MRN: [id number] DOB: [birth year]    ADMISSION DATE:  01/30/2016 CONSULTATION DATE:  02/03/16   REFERRING MD:  [facility]   CHIEF COMPLAINT: " I want to get off these machines"  HISTORY OF PRESENT ILLNESS:   70 yowf MO s/p smoking cessation around 2010 severely debilitated with sob across the room on home 02 around the clock 4lpm last seen in pulmonary clinic in 2008 and did not return with no pfts in epic but carries dx of copd/ohs and admittted with acute on chronic hypercarbic resp failure and with previous eval by Titus Mould in 01/06/16 and made DNR p brief ET "hated it" and readmit 01/30/16 with sob and PCCM asked to see am 10/15 with hypercapnea but initially refusing to continue bipap, now wearing BiPAP each evening.    SUBJECTIVE:   Met with palliative care again 10/17 and decision made to continue with full DNR and return home with hospice assistance.  Confirmed this with pt this AM and she states she is just hoping to return to somewhat of a functional state after returning home. Denies chest pains, worsening SOB.  In fact, feels SOB slightly improved.  Resting comfortably.  Has been using BiPAP nocturnally and is agreeable to continue to do so.   VITAL SIGNS: BP 136/60   Pulse 82   Temp 98.2 F (36.8 C) (Oral)   Resp (!) 21   Ht '5\' 7"'  (1.702 m)   Wt 236 lb 15.9 oz (107.5 kg)   SpO2 98%   BMI 37.12 kg/m    INTAKE / OUTPUT: I/O last 3 completed shifts: In: 494.8 [P.O.:240; I.V.:254.8] Out: 1720 [Urine:1720]  PHYSICAL EXAMINATION:  General: chronically ill appearing, talking on phone in NAD Neuro: awake and alert, appropriate, MAE  CV: s1s2 rrr PULM: resps even non labored on [place], diminished throughout, no audible wheeze GI: round, soft, non tender, obese  Extremities: no deformities, no edema    LABS:  BMET  Recent Labs Lab 02/03/16 0409 02/04/16 0342 02/05/16 0320  NA 138 135 133*  K 4.0 4.4 4.2  CL 82*  79* 78*  CO2 44* 48* 47*  BUN '18 20 20  ' CREATININE 1.29* 1.35* 1.44*  GLUCOSE 171* 170* 151*    Electrolytes  Recent Labs Lab 02/03/16 0409 02/04/16 0342 02/05/16 0320  CALCIUM 9.0 8.9 8.7*  MG 1.7 1.6* 1.6*  PHOS 4.5 3.7 3.1    CBC  Recent Labs Lab 02/03/16 0409 02/04/16 0342 02/05/16 0320  WBC 10.0 10.7* 11.9*  HGB 10.1* 10.2* 9.3*  HCT 35.0* 34.7* 31.2*  PLT 256 276 257    Coag's No results for input(s): APTT, INR in the last 168 hours.  Sepsis Markers No results for input(s): LATICACIDVEN, PROCALCITON, O2SATVEN in the last 168 hours.  ABG  Recent Labs Lab 02/02/16 2255 02/03/16 0310 02/03/16 0730  PHART 7.404 7.317* 7.343*  PCO2ART 73.7* 101* 94.9*  PO2ART 86.6 69.7* 59.9*    Liver Enzymes  Recent Labs Lab 02/03/16 0409 02/04/16 0342 02/05/16 0320  AST 12* 10* 11*  ALT 13* 11* 10*  ALKPHOS 39 39 38  BILITOT 0.7 0.7 0.6  ALBUMIN 2.7* 2.7* 2.5*    Cardiac Enzymes  Recent Labs Lab 01/31/16 0140 01/31/16 0448 01/31/16 1103  TROPONINI 0.03* 0.03* <0.03    Glucose  Recent Labs Lab 02/04/16 2058 02/05/16 0756 02/05/16 1219 02/05/16 1616 02/05/16 2133 02/06/16 0852  GLUCAP 290* 146* 247* 151* 320* 258*  Imaging Dg Chest 2 View  Result Date: 02/06/2016 CLINICAL DATA:  70 year old female with shortness of breath. V/Q scan today. Initial encounter. EXAM: CHEST  2 VIEW COMPARISON:  02/02/2016 portable chest radiographs and earlier. FINDINGS: Seated upright AP and lateral views of the chest compared to the two view study on 01/30/2016. Stable mild cardiomegaly. Other mediastinal contours are within normal limits. Visualized tracheal air column is within normal limits. Calcified aortic atherosclerosis. Chronic left lateral rib fractures. Diffuse increased pulmonary interstitial markings not significantly changed compared to 01/30/2016. Suspected small pleural effusions on the prior study may have regressed. No pneumothorax. No  consolidation. Continued streaky infrahilar opacity which most resembles atelectasis. Osteopenia. No acute osseous abnormality identified. IMPRESSION: 1. Mildly improved basilar ventilation since 01/30/2016. Small pleural effusions probably have regressed since that time. 2. Continued diffuse interstitial opacity. Favor edema over viral/atypical respiratory infection. 3. Continued streaky infrahilar opacity favored due to atelectasis. 4.  Calcified aortic atherosclerosis. Electronically Signed   By: Genevie Ann M.D.   On: 02/06/2016 09:49   Nm Pulmonary Perf And Vent  Result Date: 02/06/2016 CLINICAL DATA:  Acute respiratory failure for 3 weeks. History of COPD, asthma, chronic shortness of breath. Possible pneumonia. EXAM: NUCLEAR MEDICINE VENTILATION - PERFUSION LUNG SCAN TECHNIQUE: Ventilation images were obtained in multiple projections using inhaled aerosol Tc-67mDTPA. Perfusion images were obtained in multiple projections after intravenous injection of Tc-935mAA. RADIOPHARMACEUTICALS:  31.2 mCi Technetium-9979mPA aerosol inhalation and 4.2 mCi Technetium-40m65m IV COMPARISON:  Chest x-ray 02/06/2016 FINDINGS: Ventilation: Study quality is degraded by patient motion and patient body habitus.4 of the 8 ventilation projections could not be obtained. There is mild patchy ventilation. Findings are consistent with COPD. Perfusion: No wedge shaped peripheral perfusion defects to suggest acute pulmonary embolism. Study quality is degraded by patient body habitus and patient motion.Two of the 8 images could not be performed because of patient body habitus. IMPRESSION: 1. Study quality is limited as described above. 2. Changes consistent with COPD on ventilation. 3. No perfusion defects to indicate presence of pulmonary embolus. Electronically Signed   By: ElizNolon Nations.   On: 02/06/2016 09:45     ASSESSMENT / PLAN:   Acute on chronic respiratory failure r/t end-stage COPD with OHS +/- OSA.   PLAN -   DNI / DNR confirmed with pt, plan is to return to home with the assistance of hospice. Titrate O2 as able to keep sats 88-90%.  Minimize sedating medications.  Continue scheduled BD dulera, duoneb. Continue to taper IV steroids and transition to PO. Continue diuresis as able. Continue nocturnal BiPAP, she is now agreeable to continue use. Fully support / agree with home with hospice. Nothing further to add from pulmonary standpoint.   Pt with severe chronic respiratory failure and overall decline last few months. Pt wishes to be DNI status (which is appropriate given her underlying lung disease) and had did  agree to bipap 10/16-10/17 pm. ( She was initially refusing to wear) She wants to continue full medical care Palliative care also working with pt and family and decision made to return home with hospice assistance.  PCCM will sign off.  Please do not hesitate to call us [facility] if we can be of any further assistance.   [facility] Pager: [phone] ([phone], 11:05 AM  ATTENDING NOTE / ATTESTATION NOTE :   I have discussed the case with  the resident/APP  Rahul Desai.   I agree with the resident/APP's  history, physical examination, assessment, and plans.    I have edited the above note and modified it according to our agreed history, physical examination, assessment and plan.   Plan as above.  Noted plans after meeting with palliative care team. Pt wants to go home on hospice. Remains DNR/DNI. Try Bipap at HS and prn if able. PCCM will sign off. Call back if with issues.    Monica Becton, MD 02/06/2016, 4:03 PM [facility] Pager [phone] After 3 pm or if no answer, call [phone]

## 2016-02-06 NOTE — Progress Notes (Signed)
Patient was on contact precautions for +MRSA in her sputum. This was from a previous admission on 01/06/16. MRSA PCR negative on this admission. Per hospital policy, patient is able have contact precautions d/c'd since her +MRSA was over 30 days ago and from a previous admission. This was verified by Claris Che with Infection Prevention.  Leanna Battles, RN.

## 2016-02-07 ENCOUNTER — Other Ambulatory Visit: Payer: Self-pay | Admitting: *Deleted

## 2016-02-07 DIAGNOSIS — J431 Panlobular emphysema: Secondary | ICD-10-CM

## 2016-02-07 DIAGNOSIS — I10 Essential (primary) hypertension: Secondary | ICD-10-CM

## 2016-02-07 LAB — COMPREHENSIVE METABOLIC PANEL
ALBUMIN: 3 g/dL — AB (ref 3.5–5.0)
ALT: 12 U/L — ABNORMAL LOW (ref 14–54)
ANION GAP: 12 (ref 5–15)
AST: 13 U/L — ABNORMAL LOW (ref 15–41)
Alkaline Phosphatase: 39 U/L (ref 38–126)
BUN: 27 mg/dL — ABNORMAL HIGH (ref 6–20)
CHLORIDE: 79 mmol/L — AB (ref 101–111)
CO2: 39 mmol/L — AB (ref 22–32)
Calcium: 8.8 mg/dL — ABNORMAL LOW (ref 8.9–10.3)
Creatinine, Ser: 1.5 mg/dL — ABNORMAL HIGH (ref 0.44–1.00)
GFR calc non Af Amer: 34 mL/min — ABNORMAL LOW (ref >60)
GFR, EST AFRICAN AMERICAN: 40 mL/min — AB (ref >60)
GLUCOSE: 262 mg/dL — AB (ref 65–99)
POTASSIUM: 4.9 mmol/L (ref 3.5–5.1)
SODIUM: 130 mmol/L — AB (ref 135–145)
Total Bilirubin: 0.5 mg/dL (ref 0.3–1.2)
Total Protein: 6.4 g/dL — ABNORMAL LOW (ref 6.5–8.1)

## 2016-02-07 LAB — GLUCOSE, CAPILLARY
Glucose-Capillary: 237 mg/dL — ABNORMAL HIGH (ref 65–99)
Glucose-Capillary: 280 mg/dL — ABNORMAL HIGH (ref 65–99)
Glucose-Capillary: 312 mg/dL — ABNORMAL HIGH (ref 65–99)

## 2016-02-07 LAB — CBC
HCT: 31.6 % — ABNORMAL LOW (ref 36.0–46.0)
HEMOGLOBIN: 9.5 g/dL — AB (ref 12.0–15.0)
MCH: 26.7 pg (ref 26.0–34.0)
MCHC: 30.1 g/dL (ref 30.0–36.0)
MCV: 88.8 fL (ref 78.0–100.0)
PLATELETS: 319 10*3/uL (ref 150–400)
RBC: 3.56 MIL/uL — ABNORMAL LOW (ref 3.87–5.11)
RDW: 16.5 % — AB (ref 11.5–15.5)
WBC: 15.3 10*3/uL — ABNORMAL HIGH (ref 4.0–10.5)

## 2016-02-07 MED ORDER — FUROSEMIDE 20 MG PO TABS
20.0000 mg | ORAL_TABLET | Freq: Every day | ORAL | Status: DC
  Administered 2016-02-07 – 2016-02-08 (×2): 20 mg via ORAL
  Filled 2016-02-07 (×2): qty 1

## 2016-02-07 MED ORDER — PREDNISONE 20 MG PO TABS
40.0000 mg | ORAL_TABLET | Freq: Every day | ORAL | Status: DC
  Administered 2016-02-08: 40 mg via ORAL
  Filled 2016-02-07: qty 2

## 2016-02-07 NOTE — Progress Notes (Signed)
PROGRESS NOTE    [name]  QQV:[id number] DOB: 12-20-1945 DOA: 01/30/2016 PCP: [name], MD   Brief Narrative:  70 y.o. female admitted 3 weeks ago for respiratory failure and encephalopathy with known hx of sleep apnea non compliant with CPAP, chronic afib, COPD, and DM, presents with worsening shortness of breath. Patient states she has chronic shortness of breath which had acutely worsened today with increasing lower extremity edema. and chest tightness. Early in the morning at her house, patient also had a fall and hit her head but did not lose consciousness after she lost her balance. Patient still has mild occipital headache, CT head is pending. CXR shows congestion and mild pleural effusion with possible infiltrates. Patient on exam, is mildly short of breath but able to complete sentences. JVD is elevated with bilateral lower extremity edema and mild wheezing. Since CXR was showing possible infiltrates patient was started on antibiotics for pneumonia . Patient also has clinical features consistent with CHF and has been given lasix 40mg  and placed on oxygen mask. Patient was admitted for further managemt of acue respiratory failure possibly a combination of CHF and COPD with possible pneumonia.   ED Course: chloride 85, Cr 1.38, glucose 301, WBC 14.9, hgb 9.6 see hix of present illness.  While in the ED, she was started on IV antibiotics cefepime and vancomycin, lasix 40mg , and nebs.     Subjective: 10/19 A/O 4, states was not using CPAP prior to this admission. Was on 4 L O2 via [place] 24 hours a day. At one point was supposed to use CPAP but was unable to tolerate mask secondary to anxiety. States during hospitalization has been able to tolerate BiPAP with medication at night.    Assessment & Plan:   Principal Problem:   Acute on chronic respiratory failure with hypoxia and hypercapnia (HCC) Active Problems:   Diabetes mellitus type II, uncontrolled (HCC)   COPD (chronic  obstructive pulmonary disease) (HCC)   OSA (obstructive sleep apnea)   Iron deficiency anemia   Acute on chronic respiratory failure with hypoxia (HCC)   Hypertension   COPD exacerbation (HCC)   Type 2 diabetes mellitus without complication, without long-term current use of insulin (HCC)   Acute respiratory failure (HCC)   CHF (congestive heart failure) (HCC)   Palliative care encounter   Fall   SOB (shortness of breath)   DNR (do not resuscitate)   Chronic diastolic CHF (congestive heart failure) (HCC)   Dilated cardiomyopathy (HCC)   HCAP (healthcare-associated pneumonia)   CKD (chronic kidney disease), stage III   Acute on Chronic Hypercapnic Hypoxic Respiratory Failure likley 2/2 Acute Exacerbation of CHF with likely concomitant COPD and ? HCAP  -10/19 BiPAP settings 18/8 at 60% O2 -10/19 HFNC; as of 10/19 has been weaned to 5 L/min -Continue to Monitor O2 Sats;  -VQ scan negative for PE  -Completed course of antibiotics. -Palliative Care Consulted for additional Recc's-patient has agreed to go home with Hospice.    Supected Acute Decompensation of Chronic Diastolic of CHF with EF of 60-65%/Cardiomyopathy -Repeat ECHOcardiogram; Systolic Function was vigorous with Estimated EF of 65-70%. The study is not technically sufficient to allowevaluation of LV diastolic function -Daily Weights Filed Weights   02/05/16 0424 02/06/16 0500 02/07/16 0457  Weight: 107.5 kg (236 lb 15.9 oz) 107.5 kg (236 lb 15.9 oz) 108.8 kg (239 lb 13.8 oz)  , Strict I's and O's Since admission -4.8 L  -Cardiac Troponins Negative x3 -Patient with extremely poor respiratory  status. Currently in NSR will try to titrate Amiodarone off. -10/19 discontinue Amiodarone 400 mg BID -Cardizem 240 mg daily -Lasix 20 mg daily -Metoprolol 25 mg BID -Spironolactone 25 mg daily  COPD Exacerbation/HCAP  -CXR showed Ill-defined opacities in the lung bases could relate to atelectasis or infiltrate. Tiny bilateral  effusions. -Completed 5 day course antibiotics.  -DC DuoNeb secondary to A. fib -Xopenex QID -Repeat CXR showed Bibasilar Subsegmental Atelectasis.  -Prednisone 40 mg daily  Paroxysmal Atrial Fibrillation with RVR (hx cardioversion 09/2015) s/p Spontaneous Conversion to NSR(Chads2vasc score is 4. ) - See COPD exacerbation.  - Patientis on Xarelto for Anticoagulation.   Blood Culture Contiamination? -1/2 Vials had Methicillin Resistant Staph Species not MRSA -DC antibiotics  DM type 2 - Blood Sugar on BMP was 171 - Will be on Sensitivesliding scale coverage.    CKD stage III.(Baseline BUN/Cr was  17/1.36) Lab Results  Component Value Date   CREATININE 1.50 (H) 02/07/2016   CREATININE 1.44 (H) 02/05/2016   CREATININE 1.35 (H) 02/04/2016    Chronic Iron deficiency Anemia - On iron replacement with Ferrous Sulfate 325 mg po Daily.  - Hemoglobin went from 9.1/30.9 -> 8.5/28.4 -> 10.0/34.4 -> 10.2/34.7 - Repeat CBC in AM with Diff  OSA.  -Compliant with BiPAP -Trazodone 50-100 mg PO PRN QHS   Mechanical Fall. - Patient has a history of brain aneurysm with coiling.  - CT head showed Left internal carotid artery terminus and basilar coil masses with extensive streak artifact obscuring the brain at those levels. Within the visualized brain no acute abnormality is identified. Grossly stable moderate chronic microvascular ischemic changes given differences in technique.  Right Sided Rib Pain - Improved Pain control with Norco.  - Likely 2/2 to Mechanical Fall - C/w Norco 1 tablet q6hprn - Continue to Monitor   DVT prophylaxis: Xarelto Code Status: DO NOT RESUSCITATE Family Communication: None Disposition Plan: Resolution acute respiratory failure   Consultants:  Dr Vonna Kotyk. Jacinto Halim,  cardiology NP [facility]    Procedures/Significant Events:  10/12 echocardiogram:LVEF= 65% to 70%.- Mitral valve: Calcified annulus. - Right ventricle: mildly  dilated.- Right atrium: mildly dilated. - Inferior vena cava: The vessel was dilated. The respirophasic diameter changes were blunted (< 50%), consistent with elevated central venous pressure.  Cultures 10/12 MRSA by PCR negative 10/13 blood right AC NGTD 10/13 blood left arm positive coag negative staph (most likely contaminant)    Antimicrobials: Cefepime 10/11>> 10/17 Vancomycin 10/11>> 10/17   Devices None   LINES / TUBES:  None    Continuous Infusions: . sodium chloride 10 mL/hr at 02/05/16 1400     Objective: Vitals:   02/07/16 0330 02/07/16 0333 02/07/16 0457 02/07/16 0700  BP: 124/61   119/63  Pulse: (!) 55   (!) 57  Resp: (!) 25   (!) 21  Temp: 97.7 F (36.5 C) 97.7 F (36.5 C)  98.5 F (36.9 C)  TempSrc: Oral Oral  Oral  SpO2: 100%   100%  Weight:   108.8 kg (239 lb 13.8 oz)   Height:        Intake/Output Summary (Last 24 hours) at 02/07/16 0844 Last data filed at 02/06/16 1600  Gross per 24 hour  Intake              600 ml  Output              750 ml  Net             -  150 ml   Filed Weights   02/05/16 0424 02/06/16 0500 02/07/16 0457  Weight: 107.5 kg (236 lb 15.9 oz) 107.5 kg (236 lb 15.9 oz) 108.8 kg (239 lb 13.8 oz)    Examination:  General: A/O 4, positive acute on chronic respiratory distress (on 5 L O2 via HFNC to maintain adequate SPO2:  Eyes: negative scleral hemorrhage, negative anisocoria, negative icterus ENT: Negative Runny nose, negative gingival bleeding, Neck:  Negative scars, masses, torticollis, lymphadenopathy, JVD Lungs: poor to no air movement all lung fields, bibasilar crackles, negative expiratory wheezing.  Cardiovascular:  Tachycardic,Regular rhythm without murmur gallop or rub normal S1 and S2 Abdomen: morbidly obese, negative abdominal pain, nondistended, positive soft, bowel sounds, no rebound, no ascites, no appreciable mass Extremities:  bilateral lower extremity edema 1+ (per sister significantly improved),  left first metatarsal erythematous warm to touch (consistent with RA flare). Skin: Negative rashes, lesions, ulcers Psychiatric:  Negative depression, negative anxiety, negative fatigue, negative mania  Central nervous system:  Cranial nerves II through XII intact, tongue/uvula midline, all extremities muscle strength 5/5, sensation intact throughout, negative dysarthria, negative expressive aphasia, negative receptive aphasia.  .     Data Reviewed: Care during the described time interval was provided by me .  I have reviewed this patient's available data, including medical history, events of note, physical examination, and all test results as part of my evaluation. I have personally reviewed and interpreted all radiology studies.  CBC:  Recent Labs Lab 02/01/16 0311 02/02/16 0306 02/03/16 0409 02/04/16 0342 02/05/16 0320 02/07/16 0417  WBC 16.8* 12.2* 10.0 10.7* 11.9* 15.3*  NEUTROABS 13.7* 8.3* 6.8 7.8* 8.4*  --   HGB 8.5* 10.0* 10.1* 10.2* 9.3* 9.5*  HCT 28.4* 34.4* 35.0* 34.7* 31.2* 31.6*  MCV 89.9 92.2 93.3 92.5 90.7 88.8  PLT 281 278 256 276 257 319   Basic Metabolic Panel:  Recent Labs Lab 02/01/16 0311 02/02/16 0306 02/03/16 0409 02/04/16 0342 02/05/16 0320 02/07/16 0417  NA 134* 137 138 135 133* 130*  K 4.3 3.4* 4.0 4.4 4.2 4.9  CL 81* 79* 82* 79* 78* 79*  CO2 46* 49* 44* 48* 47* 39*  GLUCOSE 190* 142* 171* 170* 151* 262*  BUN 20 17 18 20 20  27*  CREATININE 1.56* 1.36* 1.29* 1.35* 1.44* 1.50*  CALCIUM 8.5* 8.8* 9.0 8.9 8.7* 8.8*  MG 1.6* 1.5* 1.7 1.6* 1.6*  --   PHOS 4.1 4.6 4.5 3.7 3.1  --    GFR: Estimated Creatinine Clearance: 44.3 mL/min (by C-G formula based on SCr of 1.5 mg/dL (H)). Liver Function Tests:  Recent Labs Lab 02/01/16 0311 02/03/16 0409 02/04/16 0342 02/05/16 0320 02/07/16 0417  AST 17 12* 10* 11* 13*  ALT 13* 13* 11* 10* 12*  ALKPHOS 36* 39 39 38 39  BILITOT 0.4 0.7 0.7 0.6 0.5  PROT 5.6* 5.8* 6.0* 5.6* 6.4*  ALBUMIN 2.8*  2.7* 2.7* 2.5* 3.0*   No results for input(s): LIPASE, AMYLASE in the last 168 hours. No results for input(s): AMMONIA in the last 168 hours. Coagulation Profile: No results for input(s): INR, PROTIME in the last 168 hours. Cardiac Enzymes:  Recent Labs Lab 01/31/16 1103  TROPONINI <0.03   BNP (last 3 results) No results for input(s): PROBNP in the last 8760 hours. HbA1C: No results for input(s): HGBA1C in the last 72 hours. CBG:  Recent Labs Lab 02/06/16 0852 02/06/16 1129 02/06/16 1705 02/06/16 2148 02/07/16 0737  GLUCAP 258* 281* 190* 362* 237*   Lipid Profile: No  results for input(s): CHOL, HDL, LDLCALC, TRIG, CHOLHDL, LDLDIRECT in the last 72 hours. Thyroid Function Tests: No results for input(s): TSH, T4TOTAL, FREET4, T3FREE, THYROIDAB in the last 72 hours. Anemia Panel: No results for input(s): VITAMINB12, FOLATE, FERRITIN, TIBC, IRON, RETICCTPCT in the last 72 hours. Urine analysis:    Component Value Date/Time   COLORURINE YELLOW 01/06/2016 1047   APPEARANCEUR CLEAR 01/06/2016 1047   LABSPEC 1.013 01/06/2016 1047   PHURINE 5.0 01/06/2016 1047   GLUCOSEU 250 (A) 01/06/2016 1047   HGBUR NEGATIVE 01/06/2016 1047   BILIRUBINUR NEGATIVE 01/06/2016 1047   KETONESUR NEGATIVE 01/06/2016 1047   PROTEINUR NEGATIVE 01/06/2016 1047   UROBILINOGEN 0.2 02/06/2015 1906   NITRITE NEGATIVE 01/06/2016 1047   LEUKOCYTESUR NEGATIVE 01/06/2016 1047   Sepsis Labs: @LABRCNTIP (procalcitonin:4,lacticidven:4)  ) Recent Results (from the past 240 hour(s))  MRSA PCR Screening     Status: None   Collection Time: 01/31/16  6:28 AM  Result Value Ref Range Status   MRSA by PCR NEGATIVE NEGATIVE Final    Comment:        The GeneXpert MRSA Assay (FDA approved for NASAL specimens only), is one component of a comprehensive MRSA colonization surveillance program. It is not intended to diagnose MRSA infection nor to guide or monitor treatment for MRSA infections.   Culture,  blood (Routine X 2) w Reflex to ID Panel     Status: None   Collection Time: 02/01/16  7:41 AM  Result Value Ref Range Status   Specimen Description BLOOD RIGHT ANTECUBITAL  Final   Special Requests BOTTLES DRAWN AEROBIC AND ANAEROBIC 5CC  Final   Culture NO GROWTH 5 DAYS  Final   Report Status 02/06/2016 FINAL  Final  Culture, blood (Routine X 2) w Reflex to ID Panel     Status: Abnormal   Collection Time: 02/01/16  7:43 AM  Result Value Ref Range Status   Specimen Description BLOOD LEFT ARM  Final   Special Requests IN PEDIATRIC BOTTLE 3CC  Final   Culture  Setup Time   Final    GRAM POSITIVE COCCI IN CLUSTERS AEROBIC BOTTLE ONLY Organism ID to follow CRITICAL RESULT CALLED TO, READ BACK BY AND VERIFIED WITH: A. Masters Pharm.D. 9:05 02/02/16  ([email or web address])    Culture (A)  Final    STAPHYLOCOCCUS SPECIES (COAGULASE NEGATIVE) THE SIGNIFICANCE OF ISOLATING THIS ORGANISM FROM A SINGLE SET OF BLOOD CULTURES WHEN MULTIPLE SETS ARE DRAWN IS UNCERTAIN. PLEASE NOTIFY THE MICROBIOLOGY DEPARTMENT WITHIN ONE WEEK IF SPECIATION AND SENSITIVITIES ARE REQUIRED.    Report Status 02/04/2016 FINAL  Final  Blood Culture ID Panel (Reflexed)     Status: Abnormal   Collection Time: 02/01/16  7:43 AM  Result Value Ref Range Status   Enterococcus species NOT DETECTED NOT DETECTED Final   Listeria monocytogenes NOT DETECTED NOT DETECTED Final   Staphylococcus species DETECTED (A) NOT DETECTED Final    Comment: CRITICAL RESULT CALLED TO, READ BACK BY AND VERIFIED WITH: A. Masters Pharm.D. 9:05 02/02/16 ([email or web address])    Staphylococcus aureus NOT DETECTED NOT DETECTED Final   Methicillin resistance DETECTED (A) NOT DETECTED Final    Comment: CRITICAL RESULT CALLED TO, READ BACK BY AND VERIFIED WITH: A. Masters Pharm.D. 9:05 02/02/16 ([email or web address])    Streptococcus species NOT DETECTED NOT DETECTED Final   Streptococcus agalactiae NOT DETECTED NOT DETECTED Final   Streptococcus pneumoniae NOT DETECTED NOT DETECTED  Final   Streptococcus pyogenes NOT DETECTED NOT DETECTED Final   Acinetobacter baumannii NOT DETECTED NOT DETECTED  Final   Enterobacteriaceae species NOT DETECTED NOT DETECTED Final   Enterobacter cloacae complex NOT DETECTED NOT DETECTED Final   Escherichia coli NOT DETECTED NOT DETECTED Final   Klebsiella oxytoca NOT DETECTED NOT DETECTED Final   Klebsiella pneumoniae NOT DETECTED NOT DETECTED Final   Proteus species NOT DETECTED NOT DETECTED Final   Serratia marcescens NOT DETECTED NOT DETECTED Final   Haemophilus influenzae NOT DETECTED NOT DETECTED Final   Neisseria meningitidis NOT DETECTED NOT DETECTED Final   Pseudomonas aeruginosa NOT DETECTED NOT DETECTED Final   Candida albicans NOT DETECTED NOT DETECTED Final   Candida glabrata NOT DETECTED NOT DETECTED Final   Candida krusei NOT DETECTED NOT DETECTED Final   Candida parapsilosis NOT DETECTED NOT DETECTED Final   Candida tropicalis NOT DETECTED NOT DETECTED Final  Culture, blood (routine x 2)     Status: None (Preliminary result)   Collection Time: 02/05/16  4:30 PM  Result Value Ref Range Status   Specimen Description BLOOD RIGHT ANTECUBITAL  Final   Special Requests BOTTLES DRAWN AEROBIC ONLY 10CC  Final   Culture NO GROWTH < 24 HOURS  Final   Report Status PENDING  Incomplete  Culture, blood (routine x 2)     Status: None (Preliminary result)   Collection Time: 02/05/16  4:38 PM  Result Value Ref Range Status   Specimen Description BLOOD RIGHT HAND  Final   Special Requests IN PEDIATRIC BOTTLE 2CC  Final   Culture NO GROWTH < 24 HOURS  Final   Report Status PENDING  Incomplete         Radiology Studies: Dg Chest 2 View  Result Date: 02/06/2016 CLINICAL DATA:  70 year old female with shortness of breath. V/Q scan today. Initial encounter. EXAM: CHEST  2 VIEW COMPARISON:  02/02/2016 portable chest radiographs and earlier. FINDINGS: Seated upright AP and lateral views of the chest compared to the two view study  on 01/30/2016. Stable mild cardiomegaly. Other mediastinal contours are within normal limits. Visualized tracheal air column is within normal limits. Calcified aortic atherosclerosis. Chronic left lateral rib fractures. Diffuse increased pulmonary interstitial markings not significantly changed compared to 01/30/2016. Suspected small pleural effusions on the prior study may have regressed. No pneumothorax. No consolidation. Continued streaky infrahilar opacity which most resembles atelectasis. Osteopenia. No acute osseous abnormality identified. IMPRESSION: 1. Mildly improved basilar ventilation since 01/30/2016. Small pleural effusions probably have regressed since that time. 2. Continued diffuse interstitial opacity. Favor edema over viral/atypical respiratory infection. 3. Continued streaky infrahilar opacity favored due to atelectasis. 4.  Calcified aortic atherosclerosis. Electronically Signed   By: Odessa Fleming M.D.   On: 02/06/2016 09:49   Nm Pulmonary Perf And Vent  Result Date: 02/06/2016 CLINICAL DATA:  Acute respiratory failure for 3 weeks. History of COPD, asthma, chronic shortness of breath. Possible pneumonia. EXAM: NUCLEAR MEDICINE VENTILATION - PERFUSION LUNG SCAN TECHNIQUE: Ventilation images were obtained in multiple projections using inhaled aerosol Tc-79m DTPA. Perfusion images were obtained in multiple projections after intravenous injection of Tc-69m MAA. RADIOPHARMACEUTICALS:  31.2 mCi Technetium-67m DTPA aerosol inhalation and 4.2 mCi Technetium-62m MAA IV COMPARISON:  Chest x-ray 02/06/2016 FINDINGS: Ventilation: Study quality is degraded by patient motion and patient body habitus.4 of the 8 ventilation projections could not be obtained. There is mild patchy ventilation. Findings are consistent with COPD. Perfusion: No wedge shaped peripheral perfusion defects to suggest acute pulmonary embolism. Study quality is degraded by patient body habitus and patient motion.Two of the 8 images could  not be performed because  of patient body habitus. IMPRESSION: 1. Study quality is limited as described above. 2. Changes consistent with COPD on ventilation. 3. No perfusion defects to indicate presence of pulmonary embolus. Electronically Signed   By: Norva Pavlov M.D.   On: 02/06/2016 09:45        Scheduled Meds: . amiodarone  200 mg Oral Daily  . budesonide (PULMICORT) nebulizer solution  0.5 mg Nebulization BID  . diltiazem  240 mg Oral Daily  . ferrous sulfate  325 mg Oral Q breakfast  . furosemide  20 mg Intravenous Daily  . insulin aspart  0-5 Units Subcutaneous QHS  . insulin aspart  0-9 Units Subcutaneous TID WC  . insulin glargine  25 Units Subcutaneous Daily  . lactobacillus acidophilus  1 tablet Oral Daily  . levalbuterol  1.25 mg Nebulization Q6H  . magnesium oxide  400 mg Oral BID  . methylPREDNISolone (SOLU-MEDROL) injection  40 mg Intravenous Q24H  . metoprolol tartrate  25 mg Oral BID  . pantoprazole  40 mg Oral Daily  . polyethylene glycol  17 g Oral Daily  . potassium chloride  20 mEq Oral Once  . rivaroxaban  15 mg Oral Q supper  . senna  1 tablet Oral Daily  . sodium chloride flush  3 mL Intravenous Q12H  . spironolactone  25 mg Oral Daily  . traZODone  50-100 mg Oral QHS   Continuous Infusions: . sodium chloride 10 mL/hr at 02/05/16 1400     LOS: 8 days    Time spent: 40 minutes    WOODS, Roselind Messier, MD [facility] Pager [phone]   If 7PM-7AM, please contact night-coverage [email or web address] Password [email or web address] 02/07/2016, 8:44 AM

## 2016-02-07 NOTE — Telephone Encounter (Signed)
Patient is admittted. Rowe Pavy, RN, BSN, CEN [facility] [employer] [phone]

## 2016-02-07 NOTE — Care Management Important Message (Signed)
Important Message  Patient Details  Name: ANDA SOBOTTA MRN: [id number] Date of Birth: 1945-09-07   Medicare Important Message Given:  Yes    Kyla Balzarine 02/07/2016, 10:45 AM

## 2016-02-07 NOTE — Progress Notes (Addendum)
3S-16 [facility] RN Hospital Liaison Notified by [facility] of request for [facility] [place] [facility] at home after discharge.  Chart and patient information currently under review to confirm hospice eligibility.  Spoke with patient at bedside to initiate education related to hospice philosophy, services and team approach to care.  Patient verbalized understanding of the information provided.  Per discussion, plan is for discharge to home by ambulance, discharge date unknown at present.  Also spoke with patient's sister, and identified caregiver, Morrie Sheldon, by telephone.  Hospice services discussed with Nelva Bush, with good understanding verbalized.  Nelva Bush states she feels as though it is possible for her to care for patient in the home.  Please send signed, completed DNR form with patient. Patient will need prescriptions for any medications she does not have in the home.  DME needs discussed.  Patient and family requested:  Hospital bed, wide bsc, overbed table and nebulizer. If patient will be going home on nocturnal bipap, rx MUST BE FAXED to [facility]:  [phone].  [facility] needs the order for bipap settings.  If there is a bleed-in, this needs to be written in liter flow (not percent).    Please fax discharge summary to [phone]. Please call [phone] when patient is leaving the floor.  If plan is for patient to go home on insulin, please begin diabetic teaching. Sister and patient are hopeful this is not the case.  [facility] will coordinate delivery of all other DME through [facility].    The home address has been verified and is correct in the chart.  Nelva Bush (sister) to be contacted to arrange time of delivery.    [facility] aware of the above.  Kristine Garbe, RN, BSN [phone]

## 2016-02-07 NOTE — Evaluation (Signed)
Physical Therapy Evaluation Patient Details Name: [name] MRN: [id number] DOB: [birth year] Today's Date: 02/07/2016   History of Present Illness  70 y.o. female admitted to [facility] on 01/30/16 for SOB.  Pt dx with acute on chronic respiratory failure due to end stage COPD.  Pt with significant PMHx of sleep apnea, SOB, obesity, HTN, DM, COPD, cerebral aneurysm (s/p coiling and stenting), A-fib, anemia, R partial mastectomy, and cardioversion.   Plan is to d/c home with hospice services.   Clinical Impression  Pt with limited activity tolerance, but was able to walk in the hallway with O2 and RW with a seat to take rest breaks as needed.  She is weak and needs support on her feet.  She does rebound from DOE and drop in O2 with ~5 min rest break and cues to breathe vs talk.  She would benefit from further acute PT and home PT f/u if she can have this in combination with hospice care.   PT to follow acutely for deficits listed below.       Follow Up Recommendations Home health PT;Supervision/Assistance - 24 hour    Equipment Recommendations  Hospital bed;Wheelchair (measurements PT);Wheelchair cushion (measurements PT)    Recommendations for Other Services   NA    Precautions / Restrictions Precautions Precautions: Fall Precaution Comments: watch sats      Mobility  Bed Mobility               General bed mobility comments: Pt is OOB in the chair   Transfers Overall transfer level: Needs assistance Equipment used: 4-wheeled walker Transfers: Sit to/from Stand Sit to Stand: Min assist         General transfer comment: Min assist to support trunk during transitions  Ambulation/Gait Ambulation/Gait assistance: Min assist;Mod assist Ambulation Distance (Feet): 100 Feet (50'x2 with two seated rest breaks) Assistive device: 4-wheeled walker Gait Pattern/deviations: Step-through pattern;Shuffle Gait velocity: decreased Gait velocity interpretation: Below normal speed for  age/gender General Gait Details: Pt with shuffling gait pattern, LE buckling when she fatigues requiring moderate assistance to support her until she can sit down on the walker seat.           Balance Overall balance assessment: Needs assistance Sitting-balance support: Feet supported;No upper extremity supported Sitting balance-Leahy Scale: Fair     Standing balance support: Bilateral upper extremity supported Standing balance-Leahy Scale: Poor                               Pertinent Vitals/Pain Pain Assessment: No/denies pain    Home Living Family/patient expects to be discharged to:: Private residence Living Arrangements: Alone Available Help at Discharge: Family;Available 24 hours/day Type of Home: House Home Access: Stairs to enter Entrance Stairs-Rails: None Entrance Stairs-Number of Steps: 3 Home Layout: Two level;Able to live on main level with bedroom/bathroom;1/2 bath on main level Home Equipment: Walker - 4 wheels;Bedside commode;Cane - single point Additional Comments: sister is currently staying with her    Prior Function Level of Independence: Needs assistance   Gait / Transfers Assistance Needed: for the past week she has been very limited in her mobility due to SOB/DOE, sister was visiting from [place] and has now essentially stayed.            Hand Dominance   Dominant Hand: Left    Extremity/Trunk Assessment   Upper Extremity Assessment: Generalized weakness           Lower  Extremity Assessment: Generalized weakness      Cervical / Trunk Assessment: Kyphotic  Communication   Communication: No difficulties  Cognition Arousal/Alertness: Awake/alert Behavior During Therapy: WFL for tasks assessed/performed Overall Cognitive Status: Impaired/Different from baseline Area of Impairment: Memory     Memory: Decreased short-term memory         General Comments: Some STM deficits, confusing this admission with previous recent  admissions    General Comments General comments (skin integrity, edema, etc.): Pt O2 sats decrease to the low 80s on 8L O2 [place] during gait.  DOE 3/4.  Pt is able to sit and rest for 5 mins, regain her O2 sats and DOE and continue twice.         Assessment/Plan    PT Assessment Patient needs continued PT services  PT Problem List Decreased strength;Decreased activity tolerance;Decreased balance;Decreased mobility;Decreased knowledge of use of DME;Cardiopulmonary status limiting activity;Obesity          PT Treatment Interventions DME instruction;Gait training;Stair training;Functional mobility training;Therapeutic activities;Therapeutic exercise;Balance training;Patient/family education    PT Goals (Current goals can be found in the Care Plan section)  Acute Rehab PT Goals Patient Stated Goal: to go home and get stronger if she can PT Goal Formulation: With patient Time For Goal Achievement: 02/21/16 Potential to Achieve Goals: Good    Frequency Min 3X/week           End of Session Equipment Utilized During Treatment: Gait belt;Oxygen Activity Tolerance: Patient limited by fatigue;Treatment limited secondary to medical complications (Comment) (limited by DOE) Patient left: in chair;with call bell/phone within reach Nurse Communication: Mobility status         Time: 5093-2671 PT Time Calculation (min) (ACUTE ONLY): 29 min   Charges:   PT Evaluation $PT Eval Moderate Complexity: 1 Procedure PT Treatments $Gait Training: 8-22 mins        Mylissa Lambe B. Johnnetta Holstine, PT, DPT [phone]   02/07/2016, 5:13 PM

## 2016-02-07 NOTE — Care Management Note (Signed)
Case Management Note  Patient Details  Name: [name] MRN: [id number] Date of Birth: 06/08/1945  Subjective/Objective:   End-stage COPD, OHS, OSA                Action/Plan: Discharge Planning:  NCM spoke to pt and offered choice for Home Hospice. Pt requesting [facility]. States she has Rollator, oxygen and bedside commode at home. Pt states she wants hospital bed, and wider bedside commode. Pt will need Bipap at home. Contacted [facility] with new referral.   PCP POLITE, RONALD  Expected Discharge Date:                Expected Discharge Plan:  Home w Hospice Care  In-House Referral:  Clinical Social Work  Discharge planning Services  CM Consult  Post Acute Care Choice:  Hospice Choice offered to:  Patient  DME Arranged:    DME Agency:     HH Arranged:  RN HH Agency:  [facility] [place] [facility]  Status of Service:  In process, will continue to follow  If discussed at Long Length of Stay Meetings, dates discussed:    Additional Comments:  Elliot Cousin, RN 02/07/2016, 9:46 AM

## 2016-02-07 NOTE — Progress Notes (Signed)
[facility]  Spoke with patient's sister, Nelva Bush, by telephone.  She states DME will be delivered to the home between 10 and 1 on 10/20.  Kristine Garbe, RN, BSN [phone]

## 2016-02-08 ENCOUNTER — Other Ambulatory Visit: Payer: Self-pay

## 2016-02-08 LAB — GLUCOSE, CAPILLARY
GLUCOSE-CAPILLARY: 282 mg/dL — AB (ref 65–99)
Glucose-Capillary: 210 mg/dL — ABNORMAL HIGH (ref 65–99)
Glucose-Capillary: 222 mg/dL — ABNORMAL HIGH (ref 65–99)

## 2016-02-08 MED ORDER — ASCORBIC ACID 500 MG PO TABS: 500.0000 mg | ORAL_TABLET | Freq: Two times a day (BID) | ORAL | 0 refills | Status: AC

## 2016-02-08 MED ORDER — VITAMIN C 500 MG PO TABS
500.0000 mg | ORAL_TABLET | Freq: Two times a day (BID) | ORAL | Status: DC
  Filled 2016-02-08: qty 1

## 2016-02-08 MED ORDER — INSULIN ASPART 100 UNIT/ML ~~LOC~~ SOLN: 0.0000 [IU] | Freq: Three times a day (TID) | SUBCUTANEOUS | 0 refills | Status: AC

## 2016-02-08 MED ORDER — LORAZEPAM 0.5 MG PO TABS: 0.5000 mg | ORAL_TABLET | Freq: Three times a day (TID) | ORAL | 0 refills | Status: AC | PRN

## 2016-02-08 MED ORDER — INSULIN ASPART 100 UNIT/ML ~~LOC~~ SOLN: 0.0000 [IU] | Freq: Every day | SUBCUTANEOUS | 0 refills | Status: AC

## 2016-02-08 MED ORDER — PREDNISONE 10 MG PO TABS: 30.0000 mg | ORAL_TABLET | Freq: Every day | ORAL | 0 refills | Status: DC

## 2016-02-08 MED ORDER — BLOOD GLUCOSE MONITOR KIT: PACK | 0 refills | Status: AC

## 2016-02-08 MED ORDER — PREDNISONE 5 MG PO TABS: ORAL_TABLET | ORAL | 0 refills | Status: AC

## 2016-02-08 MED ORDER — PREDNISONE 20 MG PO TABS: 30.0000 mg | ORAL_TABLET | Freq: Every day | ORAL | Status: DC

## 2016-02-08 MED ORDER — "INSULIN SYRINGE 27G X 1/2"" 0.5 ML MISC": 25.0000 [IU] | Freq: Every day | 0 refills | Status: AC

## 2016-02-08 MED ORDER — RIVAROXABAN 15 MG PO TABS: 15.0000 mg | ORAL_TABLET | Freq: Every day | ORAL | 0 refills | Status: AC

## 2016-02-08 MED ORDER — "INSULIN SYRINGE-NEEDLE U-100 29G X 1/2"" 0.3 ML MISC": 25.0000 [IU] | Freq: Every day | 0 refills | Status: AC

## 2016-02-08 MED ORDER — LEVALBUTEROL HCL 1.25 MG/0.5ML IN NEBU: 1.2500 mg | INHALATION_SOLUTION | Freq: Three times a day (TID) | RESPIRATORY_TRACT | 0 refills | Status: AC | PRN

## 2016-02-08 MED ORDER — DILTIAZEM HCL ER COATED BEADS 240 MG PO CP24: 240.0000 mg | ORAL_CAPSULE | Freq: Every day | ORAL | 0 refills | Status: AC

## 2016-02-08 MED ORDER — TRAZODONE HCL 50 MG PO TABS: 50.0000 mg | ORAL_TABLET | Freq: Every day | ORAL | 0 refills | Status: AC

## 2016-02-08 MED ORDER — SPIRONOLACTONE 25 MG PO TABS: 25.0000 mg | ORAL_TABLET | Freq: Every day | ORAL | 0 refills | Status: AC

## 2016-02-08 MED ORDER — LEVALBUTEROL HCL 1.25 MG/0.5ML IN NEBU
1.2500 mg | INHALATION_SOLUTION | Freq: Three times a day (TID) | RESPIRATORY_TRACT | Status: DC
  Administered 2016-02-08 (×3): 1.25 mg via RESPIRATORY_TRACT
  Filled 2016-02-08 (×3): qty 0.5

## 2016-02-08 MED ORDER — PREDNISONE 10 MG PO TABS: ORAL_TABLET | ORAL | 0 refills | Status: AC

## 2016-02-08 MED ORDER — FERROUS SULFATE 325 (65 FE) MG PO TABS
325.0000 mg | ORAL_TABLET | Freq: Two times a day (BID) | ORAL | 3 refills | Status: AC
Start: 2016-02-08 — End: ?

## 2016-02-08 MED ORDER — BUDESONIDE 0.5 MG/2ML IN SUSP: 0.5000 mg | Freq: Two times a day (BID) | RESPIRATORY_TRACT | 12 refills | Status: AC

## 2016-02-08 MED ORDER — INSULIN GLARGINE 100 UNIT/ML ~~LOC~~ SOLN: 25.0000 [IU] | Freq: Every day | SUBCUTANEOUS | 0 refills | Status: AC

## 2016-02-08 NOTE — Patient Outreach (Signed)
Case closure: Message received from hospital liaison that patient is discharged home with hospice care.  PLAN: will close case and this was explained to patient while in hospital. Will mail letter to patient. Will notify MD of case closure.  Rowe Pavy, RN, BSN, CEN [facility] [phone]

## 2016-02-08 NOTE — Progress Notes (Addendum)
Case Management Note  Patient Details  Name: [name] MRN: [id number] Date of Birth: [birth year]  Subjective/Objective:     COPD -endstage, CHF               Action/Plan:  Please see previous NCM notes Discharge Planning: Faxed Bipap order to [facility] [phone]. Contacted [facility] and they do have order for Bipap. #[id number] ticket  Scheduled delivery of DME is from 10-1 pm today. Pt scheduled to receive hospital bed, oxygen -high flow concentrator, overbed table, bedside commode, bipap, and neb machine with supplies. Message to [facility] Liaison and pt does want a light weight manual wheelchair. Will arrrange PTAR when dc and DME has arrive to pt's home. Sister, Nelva Bush will be there to receive DME.   Expected Discharge Date:  02/08/2016               Expected Discharge Plan:  Home w Hospice Care  In-House Referral:  Clinical Social Work  Discharge planning Services  CM Consult  Post Acute Care Choice:  Hospice Choice offered to:  Patient  DME Arranged:  3-N-1, Bipap, Lightweight manual wheelchair with seat cushion, Hospital bed, Nebulizer/meds, Nebulizer machine, Overbed table, Oxygen DME Agency:  [facility]  HH Arranged:  RN [facility] Agency:  [facility] [place]  Status of Service:  Completed, signed off  If discussed at [employer], dates discussed:    Additional Comments:  Elliot Cousin, RN 02/08/2016, 12:02 PM

## 2016-02-08 NOTE — Discharge Summary (Addendum)
Physician Discharge Summary  [name] YTK:[id number] DOB: 11/24/1945 DOA: 01/30/2016  PCP: Kandice Hams, MD  Admit date: 01/30/2016 Discharge date: 02/08/2016  Time spent: 12mnutes  Recommendations for Outpatient Follow-up:   Acute on Chronic Hypercapnic Hypoxic Respiratory Failure likley 2/2 Acute Exacerbation of CHF with likely concomitant COPD and ? HCAP  -10/19 BiPAP settings 18/8. 5 LPM with a goal of keeping SPO2 89-93% -10/19 HFNC; as of 10/19 has been weaned to 5 L/min -VQ scan negative for PE  -Completed course of antibiotics. -Palliative Care Consulted for additional Recc's-patient has agreed to go home with Hospice. -Patient's future care Arrangements have been made with [facility]   Supected Acute Decompensation of Chronic Diastolic of CHF with EF of 60-65%/Cardiomyopathy -Repeat ECHOcardiogram;LVEF= 65-70%.  -Daily Weights Filed Weights   02/06/16 0500 02/07/16 0457 02/08/16 0422  Weight: 107.5 kg (236 lb 15.9 oz) 108.8 kg (239 lb 13.8 oz) 107 kg (235 lb 14.3 oz)  -Strict I's and O's Since admission -4.8 L  -Cardizem 240 mg daily -Lasix 20 mg daily -Metoprolol 25 mg BID -Spironolactone 25 mg daily  COPD Exacerbation/HCAP  -CXR showed Ill-defined opacities in the lung bases could relate to atelectasis or infiltrate. Tiny bilateral effusions. -Completed 5 day course antibiotics.  -Xopenex QID PRN  -Repeat CXR showed Bibasilar Subsegmental Atelectasis.  -10/20 Decreased Prednisone 30 mg daily x 7 days. Then 20 mg x 7 days. Then 10 mg x 7 days. Then 574mx 7 days   Paroxysmal Atrial Fibrillation with RVR (hx cardioversion 09/2015) s/p Spontaneous Conversion to NSR(Chads2vasc score is 4. ) - See COPD exacerbation.  - Patientis on Xarelto 15 mg daily.   Blood Culture Contiamination? -1/2 Vials had Methicillin Resistant Staph Species not MRSA  DM type 2 uncontrolled with Complications - 9/7/51emoglobin A1c = 7.5  -   CKD stage  III.(Baseline BUN/Cr was 17/1.36)   Chronic Iron deficiency Anemia - On iron replacement with Ferrous Sulfate 325 mg po Daily.  -Vitamin C 500 mg BID  OSA.  -Compliant with BiPAP. See acute respiratory failure for settings -Trazodone 50-100 mg PO PRN QHS   Mechanical Fall. - Patient has a history of brain aneurysm with coiling.  - CT head showed Left internal carotid artery terminus and basilar coil masses with extensive streak artifact obscuring the brain at those levels. Within the visualized brain no acute abnormality is identified. Grossly stable moderate chronic microvascular ischemic changes given differences in technique.  Right Sided Rib Pain - Improved Pain control with Norco.  - Likely 2/2 to Mechanical Fall - Discharge on previous pain regimen. PCP to adjust      Discharge Diagnoses:  Principal Problem:   Acute on chronic respiratory failure with hypoxia and hypercapnia (HCC) Active Problems:   Diabetes mellitus type II, uncontrolled (HCC)   COPD (chronic obstructive pulmonary disease) (HCC)   OSA (obstructive sleep apnea)   Iron deficiency anemia   Acute on chronic respiratory failure with hypoxia (HCC)   Hypertension   COPD exacerbation (HCC)   Type 2 diabetes mellitus without complication, without long-term current use of insulin (HCC)   Acute respiratory failure (HCC)   CHF (congestive heart failure) (HCCamer[place]  Palliative care encounter   Fall   SOB (shortness of breath)   DNR (do not resuscitate)   Chronic diastolic CHF (congestive heart failure) (HCT[place]  Dilated cardiomyopathy ([place]  HCAP (healthcare-associated pneumonia)   CKD (chronic kidney disease), stage III   Discharge Condition: Guarded  Diet recommendation: Heart healthy American diabetic Association  Filed Weights   02/06/16 0500 02/07/16 0457 02/08/16 0422  Weight: 107.5 kg (236 lb 15.9 oz) 108.8 kg (239 lb 13.8 oz) 107 kg (235 lb 14.3 oz)    History of present illness:  70  y.o.WF PMHx OSA Noncompliant with CPAP, Chronic A-Fib, Dilated Cardiomyopathy/Chronic Diastolic CHF, HTN, COPD, Diabetes type II uncontrolled with complications, CKD stage III,. Iron deficiency Anemia  Admitted 3 weeks ago forrespiratory failure and encephalopathy    Presents withworsening shortness of breath. Patient states she has chronic shortness of breath which had acutely worsened today with increasing lower extremity edema. and chest tightness. Early in the morning at [place] house, [place] had a fall and hit her head but did not lose consciousness after she lost her balance. Patientstill has mild occipital headache, CT head is pending. CXR shows congestion and mild pleural effusion with possible infiltrates. Patient on exam, is mildly short of breath but able to complete sentences. JVD is elevated with bilateral lower extremity edema and mild wheezing. Since CXR was showing possible infiltrates patient was started on antibioticsfor pneumonia . Patientalso has clinical features consistent with CHFand has been given lasix 18m and placed on oxygen mask. Patient was admitted for further managemt of acue respiratoryfailure possibly a combination of CHF and COPD with possible pneumonia. During this hospitalization patient was treated for acute on chronic respiratory failure with hypoxia and hypercapnia with concomitant COPD exacerbation, HCAP, and decompensated cardiomyopathy. Initially patient unable to maintain SPO2 adequate level except on HFNC at settings as high as 10 LPM. Although no longer requiring 10 LPM patient still requires HFNC to maintain adequate SPO2. Patient also requires BiPAP when napping or at night when sleeping.     Consultants:  Dr JUlice Dash GEinar Gip  cardiology NP [facility]Palliative care    Procedures/Significant Events:  10/12 echocardiogram:LVEF= 65% to 70%.- Mitral valve: Calcified annulus. - Right ventricle: mildly dilated.- Right atrium: mildly  dilated. - Inferior vena cava: The vessel was dilated. The respirophasic diameter changes were blunted (<50%), consistent with elevated central venous pressure.  Cultures 10/12 MRSA by PCR negative 10/13 blood right AC NGTD 10/13 blood left arm positive coag negative staph (most likely contaminant)    Antimicrobials: Cefepime 10/11>> 10/17 Vancomycin 10/11>> 10/17   Discharge Exam: Vitals:   02/08/16 0613 02/08/16 0739 02/08/16 0750 02/08/16 1215  BP: 119/60   125/76  Pulse: (!) 53   (!) 59  Resp: 16   18  Temp: 97.8 F (36.6 C)   98.2 F (36.8 C)  TempSrc: Axillary   Oral  SpO2: 100% 100% 100% 98%  Weight:      Height:        General: A/O 4, positive acute on chronic respiratory distress (on 5 L O2 via HFNC to maintain adequate SPO2:  Eyes: negative scleral hemorrhage, negative anisocoria, negative icterus ENT: Negative Runny nose, negative gingival bleeding, Neck:  Negative scars, masses, torticollis, lymphadenopathy, JVD Lungs: poor to no air movement all lung fields, bibasilar crackles, negative expiratory wheezing.  Cardiovascular:  Tachycardic,Regular rhythm without murmur gallop or rub normal S1 and S2 Discharge Instructions     Medication List    STOP taking these medications   budesonide-formoterol 80-4.5 MCG/ACT inhaler Commonly known as:  SYMBICORT   Calcium 600-200 MG-UNIT tablet   diltiazem 120 MG 12 hr capsule Commonly known as:  CARDIZEM SR   metFORMIN 500 MG tablet Commonly known as:  GLUCOPHAGE   mometasone-formoterol 100-5 MCG/ACT Aero  Commonly known as:  DULERA   OXYGEN   spironolactone-hydrochlorothiazide 25-25 MG tablet Commonly known as:  ALDACTAZIDE   tiotropium 18 MCG inhalation capsule Commonly known as:  SPIRIVA     TAKE these medications   acetaminophen 325 MG tablet Commonly known as:  TYLENOL Take 1 tablet (325 mg total) by mouth every 4 (four) hours as needed for mild pain, moderate pain, fever or headache.    ascorbic acid 500 MG tablet Commonly known as:  VITAMIN C Take 1 tablet (500 mg total) by mouth 2 (two) times daily.   blood glucose meter kit and supplies Kit Dispense based on patient and insurance preference. Use up to four times daily as directed. (FOR ICD-9 250.00, 250.01).   budesonide 0.5 MG/2ML nebulizer solution Commonly known as:  PULMICORT Take 2 mLs (0.5 mg total) by nebulization 2 (two) times daily.   CENTRUM SILVER ADULT 50+ PO Take 1 tablet by mouth daily.   diltiazem 240 MG 24 hr capsule Commonly known as:  CARDIZEM CD Take 1 capsule (240 mg total) by mouth daily. Start taking on:  02/09/2016   ferrous sulfate 325 (65 FE) MG tablet Take 1 tablet (325 mg total) by mouth 2 (two) times daily with a meal. What changed:  when to take this   furosemide 20 MG tablet Commonly known as:  LASIX Take 20 mg by mouth daily.   HYDROcodone-acetaminophen 5-325 MG tablet Commonly known as:  NORCO/VICODIN Take 1 tablet by mouth every 6 (six) hours as needed for moderate pain.   insulin aspart 100 UNIT/ML injection Commonly known as:  novoLOG Inject 0-5 Units into the skin at bedtime.   insulin aspart 100 UNIT/ML injection Commonly known as:  novoLOG Inject 0-9 Units into the skin 3 (three) times daily with meals.   insulin glargine 100 UNIT/ML injection Commonly known as:  LANTUS Inject 0.25 mLs (25 Units total) into the skin daily. Start taking on:  02/09/2016   Insulin Syringe 27G X 1/2" 0.5 ML Misc 25 Units by Does not apply route at bedtime.   Insulin Syringe-Needle U-100 29G X 1/2" 0.3 ML Misc 25 Units by Does not apply route at bedtime.   lactobacillus acidophilus Tabs tablet Take 2 tablets by mouth 3 (three) times daily. What changed:  how much to take  when to take this   levalbuterol 1.25 MG/0.5ML nebulizer solution Commonly known as:  XOPENEX Take 1.25 mg by nebulization 3 (three) times daily as needed for wheezing or shortness of breath.    LORazepam 0.5 MG tablet Commonly known as:  ATIVAN Take 1 tablet (0.5 mg total) by mouth every 8 (eight) hours as needed for anxiety. What changed:  Another medication with the same name was added. Make sure you understand how and when to take each.   LORazepam 0.5 MG tablet Commonly known as:  ATIVAN Take 1 tablet (0.5 mg total) by mouth every 8 (eight) hours as needed for anxiety. What changed:  You were already taking a medication with the same name, and this prescription was added. Make sure you understand how and when to take each.   metoprolol tartrate 25 MG tablet Commonly known as:  LOPRESSOR Take 1 tablet (25 mg total) by mouth 2 (two) times daily.   omeprazole 20 MG capsule Commonly known as:  PRILOSEC Take 20 mg by mouth daily.   predniSONE 10 MG tablet Commonly known as:  DELTASONE 1st week 3 tab with breakfast  2d week 2 tab  with breakfast  3rd week 1 tab with breakfast What changed:  additional instructions   predniSONE 5 MG tablet Commonly known as:  DELTASONE 4th week 1 tab with breakfast What changed:  You were already taking a medication with the same name, and this prescription was added. Make sure you understand how and when to take each.   PROAIR HFA 108 (90 Base) MCG/ACT inhaler Generic drug:  albuterol Inhale 2 puffs into the lungs every 6 (six) hours as needed. For shortness of breath   Rivaroxaban 15 MG Tabs tablet Commonly known as:  XARELTO Take 1 tablet (15 mg total) by mouth daily with supper. What changed:  medication strength  how much to take   spironolactone 25 MG tablet Commonly known as:  ALDACTONE Take 1 tablet (25 mg total) by mouth daily. Start taking on:  02/09/2016   traZODone 50 MG tablet Commonly known as:  DESYREL Take 1-2 tablets (50-100 mg total) by mouth at bedtime. What changed:  how much to take      Allergies  Allergen Reactions  . Gold-Containing Drug Products Other (See Comments)    Blisters   . Tape  Dermatitis   Follow-up Information    Hospice at [facility] .   Specialty:  Hospice and Palliative Medicine Why:  will call to arrange initial appointment Contact information: [place] [id number] [phone]            The results of significant diagnostics from this hospitalization (including imaging, microbiology, ancillary and laboratory) are listed below for reference.    Significant Diagnostic Studies: Dg Chest 2 View  Result Date: 02/06/2016 CLINICAL DATA:  70 year old female with shortness of breath. V/Q scan today. Initial encounter. EXAM: CHEST  2 VIEW COMPARISON:  02/02/2016 portable chest radiographs and earlier. FINDINGS: Seated upright AP and lateral views of the chest compared to the two view study on 01/30/2016. Stable mild cardiomegaly. Other mediastinal contours are within normal limits. Visualized tracheal air column is within normal limits. Calcified aortic atherosclerosis. Chronic left lateral rib fractures. Diffuse increased pulmonary interstitial markings not significantly changed compared to 01/30/2016. Suspected small pleural effusions on the prior study may have regressed. No pneumothorax. No consolidation. Continued streaky infrahilar opacity which most resembles atelectasis. Osteopenia. No acute osseous abnormality identified. IMPRESSION: 1. Mildly improved basilar ventilation since 01/30/2016. Small pleural effusions probably have regressed since that time. 2. Continued diffuse interstitial opacity. Favor edema over viral/atypical respiratory infection. 3. Continued streaky infrahilar opacity favored due to atelectasis. 4.  Calcified aortic atherosclerosis. Electronically Signed   By: Genevie Ann M.D.   On: 02/06/2016 09:49   Dg Chest 2 View  Result Date: 02/01/2016 CLINICAL DATA:  70 year old female with history of congestive heart failure. EXAM: CHEST  2 VIEW COMPARISON:  Chest x-ray 01/30/2016. FINDINGS: The status changes are noted  throughout the lungs bilaterally. There is cephalization of the pulmonary vasculature and slight indistinctness of the interstitial markings suggestive of mild pulmonary edema. Small bilateral pleural effusions. Bibasilar subsegmental atelectasis. Heart size is mildly enlarged. Upper mediastinal contours are distorted by patient's rotation the left. Atherosclerosis in the thoracic aorta. IMPRESSION: 1. The appearance of the chest suggests mild congestive heart failure. 2. Bibasilar subsegmental atelectasis. 3. Emphysema. 4. Aortic atherosclerosis. Electronically Signed   By: Vinnie Langton M.D.   On: 02/01/2016 07:01   Dg Chest 2 View  Result Date: 01/30/2016 CLINICAL DATA:  Increasing dyspnea for 2 days history of COPD EXAM: CHEST  2 VIEW COMPARISON:  01/09/2016, 09/25/2015, 02/20/2015 FINDINGS:  Heart size is slightly enlarged. Possible tiny pleural effusions. Linear atelectasis or infiltrate at the bilateral lung bases. Interval increase in central vascular congestion. Diffuse interstitial opacities, consistent with pulmonary edema, also increased. Right middle lobe atelectasis or infiltrate. Atherosclerosis of the aorta. Biapical scarring. Prominent right hilus unchanged. Multiple old left rib fractures. IMPRESSION: 1. Mild enlargement of the heart size with interval increase in central vascular congestion and interstitial pulmonary edema. 2. Ill-defined opacities in the lung bases could relate to atelectasis or infiltrate. Tiny bilateral effusions. 3. Mild asymmetric right hilar enlargement could relate to dilated pulmonary vessels. Radiographic follow-up is advised Electronically Signed   By: Donavan Foil M.D.   On: 01/30/2016 19:01   Ct Head Wo Contrast  Result Date: 01/31/2016 CLINICAL DATA:  70 y/o  F; status post fall. EXAM: CT HEAD WITHOUT CONTRAST TECHNIQUE: Contiguous axial images were obtained from the base of the skull through the vertex without intravenous contrast. COMPARISON:   01/29/2009 MR brain. 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 Within the visualized brain there is no evidence for large acute infarct, focal mass effect, intra-axial hemorrhage, or hydrocephalus. Nonspecific foci of hypoattenuation in subcortical and periventricular white matter grossly corresponded T2 FLAIR signal abnormality on the prior MRI and are compatible with moderate chronic microvascular ischemic changes. Probable chronic lacunar infarct within the left lentiform nucleus. Vascular: As above. Skull:  No displaced calvarial fracture. Sinuses/Orbits: Paranasal sinus mucosal thickening in maxillary and sphenoid sinuses. Mastoid air cells are partially opacified on the left and normally aerated on the right. Orbits are unremarkable. Other: None. 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 Within the visualized brain no acute abnormality is identified. Grossly stable moderate chronic microvascular ischemic changes given differences in technique. Electronically Signed   By: Kristine Garbe M.D.   On: 01/31/2016 00:19   Nm Pulmonary Perf And Vent  Result Date: 02/06/2016 CLINICAL DATA:  Acute respiratory failure for 3 weeks. History of COPD, asthma, chronic shortness of breath. Possible pneumonia. EXAM: NUCLEAR MEDICINE VENTILATION - PERFUSION LUNG SCAN TECHNIQUE: Ventilation images were obtained in multiple projections using inhaled aerosol Tc-45mDTPA. Perfusion images were obtained in multiple projections after intravenous injection of Tc-929mAA. RADIOPHARMACEUTICALS:  31.2 mCi Technetium-9964mPA aerosol inhalation and 4.2 mCi Technetium-22m77m IV COMPARISON:  Chest x-ray 02/06/2016 FINDINGS: Ventilation: Study quality is degraded by patient motion and patient body habitus.4 of the 8  ventilation projections could not be obtained. There is mild patchy ventilation. Findings are consistent with COPD. Perfusion: No wedge shaped peripheral perfusion defects to suggest acute pulmonary embolism. Study quality is degraded by patient body habitus and patient motion.Two of the 8 images could not be performed because of patient body habitus. IMPRESSION: 1. Study quality is limited as described above. 2. Changes consistent with COPD on ventilation. 3. No perfusion defects to indicate presence of pulmonary embolus. Electronically Signed   By: ElizNolon Nations.   On: 02/06/2016 09:45   Dg Chest Port 1 View  Result Date: 02/02/2016 CLINICAL DATA:  Shortness of breath. EXAM: PORTABLE CHEST 1 VIEW COMPARISON:  Chest radiograph yesterday at 0626 hour FINDINGS: Mild cardiomegaly is unchanged. Left pleural effusion is unchanged. Unchanged pulmonary vasculature with vascular congestion, possible pulmonary edema. Increased bibasilar atelectasis. There are remote left rib fractures. IMPRESSION: Increased bibasilar atelectasis. Otherwise unchanged appearance of the chest suggesting  CHF. Electronically Signed   By: Jeb Levering M.D.   On: 02/02/2016 23:46    Microbiology: Recent Results (from the past 240 hour(s))  MRSA PCR Screening     Status: None   Collection Time: 01/31/16  6:28 AM  Result Value Ref Range Status   MRSA by PCR NEGATIVE NEGATIVE Final    Comment:        The GeneXpert MRSA Assay (FDA approved for NASAL specimens only), is one component of a comprehensive MRSA colonization surveillance program. It is not intended to diagnose MRSA infection nor to guide or monitor treatment for MRSA infections.   Culture, blood (Routine X 2) w Reflex to ID Panel     Status: None   Collection Time: 02/01/16  7:41 AM  Result Value Ref Range Status   Specimen Description BLOOD RIGHT ANTECUBITAL  Final   Special Requests BOTTLES DRAWN AEROBIC AND ANAEROBIC 5CC  Final   Culture NO GROWTH  5 DAYS  Final   Report Status 02/06/2016 FINAL  Final  Culture, blood (Routine X 2) w Reflex to ID Panel     Status: Abnormal   Collection Time: 02/01/16  7:43 AM  Result Value Ref Range Status   Specimen Description BLOOD LEFT ARM  Final   Special Requests IN PEDIATRIC BOTTLE 3CC  Final   Culture  Setup Time   Final    GRAM POSITIVE COCCI IN CLUSTERS AEROBIC BOTTLE ONLY Organism ID to follow CRITICAL RESULT CALLED TO, READ BACK BY AND VERIFIED WITH: A. Masters Pharm.D. 9:05 02/02/16  ([email or web address])    Culture (A)  Final    STAPHYLOCOCCUS SPECIES (COAGULASE NEGATIVE) THE SIGNIFICANCE OF ISOLATING THIS ORGANISM FROM A SINGLE SET OF BLOOD CULTURES WHEN MULTIPLE SETS ARE DRAWN IS UNCERTAIN. PLEASE NOTIFY THE MICROBIOLOGY DEPARTMENT WITHIN ONE WEEK IF SPECIATION AND SENSITIVITIES ARE REQUIRED.    Report Status 02/04/2016 FINAL  Final  Blood Culture ID Panel (Reflexed)     Status: Abnormal   Collection Time: 02/01/16  7:43 AM  Result Value Ref Range Status   Enterococcus species NOT DETECTED NOT DETECTED Final   Listeria monocytogenes NOT DETECTED NOT DETECTED Final   Staphylococcus species DETECTED (A) NOT DETECTED Final    Comment: CRITICAL RESULT CALLED TO, READ BACK BY AND VERIFIED WITH: A. Masters Pharm.D. 9:05 02/02/16 ([email or web address])    Staphylococcus aureus NOT DETECTED NOT DETECTED Final   Methicillin resistance DETECTED (A) NOT DETECTED Final    Comment: CRITICAL RESULT CALLED TO, READ BACK BY AND VERIFIED WITH: A. Masters Pharm.D. 9:05 02/02/16 ([email or web address])    Streptococcus species NOT DETECTED NOT DETECTED Final   Streptococcus agalactiae NOT DETECTED NOT DETECTED Final   Streptococcus pneumoniae NOT DETECTED NOT DETECTED Final   Streptococcus pyogenes NOT DETECTED NOT DETECTED Final   Acinetobacter baumannii NOT DETECTED NOT DETECTED Final   Enterobacteriaceae species NOT DETECTED NOT DETECTED Final   Enterobacter cloacae complex NOT DETECTED NOT DETECTED Final   Escherichia coli NOT  DETECTED NOT DETECTED Final   Klebsiella oxytoca NOT DETECTED NOT DETECTED Final   Klebsiella pneumoniae NOT DETECTED NOT DETECTED Final   Proteus species NOT DETECTED NOT DETECTED Final   Serratia marcescens NOT DETECTED NOT DETECTED Final   Haemophilus influenzae NOT DETECTED NOT DETECTED Final   Neisseria meningitidis NOT DETECTED NOT DETECTED Final   Pseudomonas aeruginosa NOT DETECTED NOT DETECTED Final   Candida albicans NOT DETECTED NOT DETECTED Final   Candida glabrata NOT DETECTED NOT DETECTED Final   Candida krusei NOT DETECTED NOT  DETECTED Final   Candida parapsilosis NOT DETECTED NOT DETECTED Final   Candida tropicalis NOT DETECTED NOT DETECTED Final  Culture, blood (routine x 2)     Status: None (Preliminary result)   Collection Time: 02/05/16  4:30 PM  Result Value Ref Range Status   Specimen Description BLOOD RIGHT ANTECUBITAL  Final   Special Requests BOTTLES DRAWN AEROBIC ONLY 10CC  Final   Culture NO GROWTH 3 DAYS  Final   Report Status PENDING  Incomplete  Culture, blood (routine x 2)     Status: None (Preliminary result)   Collection Time: 02/05/16  4:38 PM  Result Value Ref Range Status   Specimen Description BLOOD RIGHT HAND  Final   Special Requests IN PEDIATRIC BOTTLE 2CC  Final   Culture NO GROWTH 3 DAYS  Final   Report Status PENDING  Incomplete     Labs: Basic Metabolic Panel:  Recent Labs Lab 02/02/16 0306 02/03/16 0409 02/04/16 0342 02/05/16 0320 02/07/16 0417  NA 137 138 135 133* 130*  K 3.4* 4.0 4.4 4.2 4.9  CL 79* 82* 79* 78* 79*  CO2 49* 44* 48* 47* 39*  GLUCOSE 142* 171* 170* 151* 262*  BUN '17 18 20 20 ' 27*  CREATININE 1.36* 1.29* 1.35* 1.44* 1.50*  CALCIUM 8.8* 9.0 8.9 8.7* 8.8*  MG 1.5* 1.7 1.6* 1.6*  --   PHOS 4.6 4.5 3.7 3.1  --    Liver Function Tests:  Recent Labs Lab 02/03/16 0409 02/04/16 0342 02/05/16 0320 02/07/16 0417  AST 12* 10* 11* 13*  ALT 13* 11* 10* 12*  ALKPHOS 39 39 38 39  BILITOT 0.7 0.7 0.6 0.5  PROT  5.8* 6.0* 5.6* 6.4*  ALBUMIN 2.7* 2.7* 2.5* 3.0*   No results for input(s): LIPASE, AMYLASE in the last 168 hours. No results for input(s): AMMONIA in the last 168 hours. CBC:  Recent Labs Lab 02/02/16 0306 02/03/16 0409 02/04/16 0342 02/05/16 0320 02/07/16 0417  WBC 12.2* 10.0 10.7* 11.9* 15.3*  NEUTROABS 8.3* 6.8 7.8* 8.4*  --   HGB 10.0* 10.1* 10.2* 9.3* 9.5*  HCT 34.4* 35.0* 34.7* 31.2* 31.6*  MCV 92.2 93.3 92.5 90.7 88.8  PLT 278 256 276 257 319   Cardiac Enzymes: No results for input(s): CKTOTAL, CKMB, CKMBINDEX, TROPONINI in the last 168 hours. BNP: BNP (last 3 results)  Recent Labs  02/01/16 0312 02/02/16 0306 02/03/16 0409  BNP 466.8* 246.9* 66.3    ProBNP (last 3 results) No results for input(s): PROBNP in the last 8760 hours.  CBG:  Recent Labs Lab 02/07/16 1214 02/07/16 1626 02/07/16 2136 02/08/16 0710 02/08/16 1219  GLUCAP 280* 210* 312* 282* 222*       Signed:  Dia Crawford, MD [facility] [phone] pager

## 2016-02-08 NOTE — Progress Notes (Signed)
Pt education completed to include future appointments, current prescriptions and medications, and doctors discharge instructions. Had case worker re explain hospice and prescriptions needed to be filled by pt. Pt alert and oriented, vital signs stable. Pt awaiting transportation with [facility]. Temp: 98.2 F (36.8 C) (10/20 1215) Temp Source: Oral (10/20 1215) BP: 125/76 (10/20 1215) Pulse Rate: 59 (10/20 1215)  Jessica Priest 02/08/2016 5:37 PM

## 2016-02-08 NOTE — Consult Note (Signed)
   [facility] CM Inpatient Consult   02/08/2016  [name] 12-Mar-1946 [id number]    Spoke [name] at bedside to offer support and encouragement. She has been followed by [facility] Care Management services. [name] states she will be going home with hospice services. States " I am glad because they can do everything". States she understands that " all of my other agencies will not follow me anymore". She is in good spirits and appears to be relieved with her decision to accept hospice services. Will update [facility] Care Management team.   Raiford Noble, MSN-Ed, RN,BSN [facility] Liaison [phone]

## 2016-02-10 LAB — CULTURE, BLOOD (ROUTINE X 2)
CULTURE: NO GROWTH
Culture: NO GROWTH

## 2016-02-11 ENCOUNTER — Other Ambulatory Visit: Payer: Self-pay | Admitting: *Deleted

## 2016-02-11 ENCOUNTER — Encounter: Payer: Self-pay | Admitting: Internal Medicine

## 2016-02-18 ENCOUNTER — Telehealth: Payer: Self-pay | Admitting: *Deleted

## 2016-04-04 ENCOUNTER — Other Ambulatory Visit: Payer: Self-pay | Admitting: Pharmacist

## 2016-04-04 NOTE — Patient Outreach (Signed)
[facility]) Care Management  04/04/2016  KUUIPO ANZALDO 04-13-1946 [id number]  70 year old female referred to [facility] pharmacy for medication assistance and medication adherence.  Per [facility] community RN patient has been discharged home with hospice.  [facility] pharmacy will not open case and will close out today. Please reconsult if needed  Hazle Nordmann, PharmD [facility] PGY2 Pharmacy Resident [phone]

## 2017-02-02 NOTE — Telephone Encounter (Signed)
Error

## 2017-02-22 IMAGING — NM NM PULMONARY VENT & PERF
10 series · 10 of 10 positions shown · non-contrast
Comparison: Chest x-ray 02/06/2016

CLINICAL DATA: Acute respiratory failure for 3 weeks. History of
COPD, asthma, chronic shortness of breath. Possible pneumonia.

EXAM:
NUCLEAR MEDICINE VENTILATION - PERFUSION LUNG SCAN
TECHNIQUE: Ventilation images were obtained in multiple projections using
inhaled aerosol Zc-SSm DTPA. Perfusion images were obtained in
multiple projections after intravenous injection of Zc-SSm MAA.
RADIOPHARMACEUTICALS:  31.2 mCi 7echnetium-JJm DTPA aerosol
inhalation and 4.2 mCi 7echnetium-JJm MAA IV

[Series 1: ant/post vent · 4.14mm/px · 1 of 1 slices shown (1 of 2)]
[im 1/1]
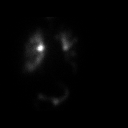

[Series 1: ant/post vent · 4.14mm/px · 1 of 1 slices shown (2 of 2)]
[im 1/1]
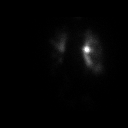

[Series 3: lao/rpo vent · 4.14mm/px · 1 of 1 slices shown (1 of 2)]
[im 1/1]
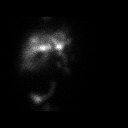

[Series 3: lao/rpo vent · 4.14mm/px · 1 of 1 slices shown (2 of 2)]
[im 1/1]
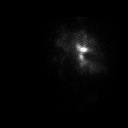

[Series 4: lpo/rao perf · 4.14mm/px · 1 of 1 slices shown (1 of 2)]
[im 1/1]
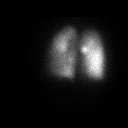

[Series 4: lpo/rao perf · 4.14mm/px · 1 of 1 slices shown (2 of 2)]
[im 1/1]
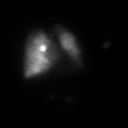

[Series 5: ant/post perf · 4.14mm/px · 1 of 1 slices shown (1 of 2)]
[im 1/1]
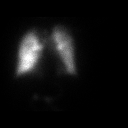

[Series 5: ant/post perf · 4.14mm/px · 1 of 1 slices shown (2 of 2)]
[im 1/1]
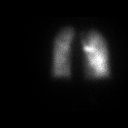

[Series 6: lao/rpo perf · 4.14mm/px · 1 of 1 slices shown (1 of 2)]
[im 1/1]
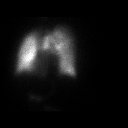

[Series 6: lao/rpo perf · 4.14mm/px · 1 of 1 slices shown (2 of 2)]
[im 1/1]
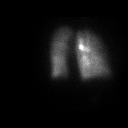

[10 of 10 positions shown; findings below may reference images not displayed]

FINDINGS: Ventilation: Study quality is degraded by patient motion and patient
body habitus.4 of the 8 ventilation projections could not be
obtained. There is mild patchy ventilation.

Findings are consistent with COPD.

Perfusion: No wedge shaped peripheral perfusion defects to suggest
acute pulmonary embolism. Study quality is degraded by patient body
habitus and patient motion.Two of the 8 images could not be
performed because of patient body habitus.
IMPRESSION: 1. Study quality is limited as described above.
2. Changes consistent with COPD on ventilation.
3. No perfusion defects to indicate presence of pulmonary embolus.
